# Patient Record
Sex: Female | Born: 1965
Health system: Southern US, Community
[De-identification: ages and names within clinical notes are randomized; demographics above are authoritative.]

## PROBLEM LIST (undated history)

## (undated) DIAGNOSIS — I10 Essential (primary) hypertension: Secondary | ICD-10-CM

## (undated) DIAGNOSIS — B029 Zoster without complications: Secondary | ICD-10-CM

## (undated) DIAGNOSIS — G35 Multiple sclerosis: Secondary | ICD-10-CM

## (undated) DIAGNOSIS — T7840XA Allergy, unspecified, initial encounter: Secondary | ICD-10-CM

## (undated) DIAGNOSIS — R531 Weakness: Secondary | ICD-10-CM

## (undated) HISTORY — DX: Multiple sclerosis: G35

## (undated) HISTORY — DX: Weakness: R53.1

## (undated) HISTORY — DX: Allergy, unspecified, initial encounter: T78.40XA

## (undated) HISTORY — PX: CHOLECYSTECTOMY: SHX55

## (undated) HISTORY — PX: ABDOMINAL HYSTERECTOMY: SHX81

---

## 1997-06-27 ENCOUNTER — Emergency Department (HOSPITAL_COMMUNITY): Admission: EM | Admit: 1997-06-27 | Discharge: 1997-06-28 | Payer: Self-pay

## 1998-08-03 ENCOUNTER — Other Ambulatory Visit: Admission: RE | Admit: 1998-08-03 | Discharge: 1998-08-03 | Payer: Self-pay | Admitting: Gynecology

## 1998-10-07 ENCOUNTER — Ambulatory Visit (HOSPITAL_COMMUNITY): Admission: RE | Admit: 1998-10-07 | Discharge: 1998-10-07 | Payer: Self-pay | Admitting: Gynecology

## 1998-10-07 ENCOUNTER — Encounter: Payer: Self-pay | Admitting: Gynecology

## 1999-03-24 ENCOUNTER — Encounter: Payer: Self-pay | Admitting: Emergency Medicine

## 1999-03-24 ENCOUNTER — Encounter: Payer: Self-pay | Admitting: Neurology

## 1999-03-24 ENCOUNTER — Observation Stay (HOSPITAL_COMMUNITY): Admission: EM | Admit: 1999-03-24 | Discharge: 1999-03-25 | Payer: Self-pay | Admitting: Emergency Medicine

## 1999-12-06 ENCOUNTER — Emergency Department (HOSPITAL_COMMUNITY): Admission: EM | Admit: 1999-12-06 | Discharge: 1999-12-07 | Payer: Self-pay | Admitting: Emergency Medicine

## 1999-12-07 ENCOUNTER — Encounter: Payer: Self-pay | Admitting: Emergency Medicine

## 1999-12-21 ENCOUNTER — Other Ambulatory Visit: Admission: RE | Admit: 1999-12-21 | Discharge: 1999-12-21 | Payer: Self-pay | Admitting: Gynecology

## 2002-08-11 ENCOUNTER — Encounter: Admission: RE | Admit: 2002-08-11 | Discharge: 2002-11-09 | Payer: Self-pay | Admitting: Family Medicine

## 2015-05-04 ENCOUNTER — Emergency Department (HOSPITAL_BASED_OUTPATIENT_CLINIC_OR_DEPARTMENT_OTHER)
Admission: EM | Admit: 2015-05-04 | Discharge: 2015-05-05 | Disposition: A | Discharge status: Home / Self Care | Payer: Self-pay | Attending: Emergency Medicine | Admitting: Emergency Medicine

## 2015-05-04 ENCOUNTER — Encounter (HOSPITAL_BASED_OUTPATIENT_CLINIC_OR_DEPARTMENT_OTHER): Payer: Self-pay | Admitting: *Deleted

## 2015-05-04 DIAGNOSIS — F17210 Nicotine dependence, cigarettes, uncomplicated: Secondary | ICD-10-CM | POA: Insufficient documentation

## 2015-05-04 DIAGNOSIS — Y929 Unspecified place or not applicable: Secondary | ICD-10-CM | POA: Insufficient documentation

## 2015-05-04 DIAGNOSIS — W07XXXA Fall from chair, initial encounter: Secondary | ICD-10-CM | POA: Insufficient documentation

## 2015-05-04 DIAGNOSIS — Y9389 Activity, other specified: Secondary | ICD-10-CM | POA: Insufficient documentation

## 2015-05-04 DIAGNOSIS — Y999 Unspecified external cause status: Secondary | ICD-10-CM | POA: Insufficient documentation

## 2015-05-04 DIAGNOSIS — S39012A Strain of muscle, fascia and tendon of lower back, initial encounter: Secondary | ICD-10-CM | POA: Insufficient documentation

## 2015-05-04 NOTE — ED Notes (Signed)
Pt c/o fall from chair x 1 day ago c/o lower back pain which radiates down right leg

## 2015-05-04 NOTE — ED Provider Notes (Signed)
CSN: [id number]     Arrival date & time 05/04/15  1949 History   First MD Initiated Contact with Patient 05/04/15 2318     Chief Complaint  Patient presents with  . Fall     (Consider location/radiation/quality/duration/timing/severity/associated sxs/prior Treatment) HPI   50 year old female presents with R lower back pain and R leg pain after falling off a chair yesterday afternoon.  Pt report she lost her balance while trying to use the chair to dust.  She fell down, denies head trauma, and no LOC.  Able to ambulate afterward.  Pain is affecting her sleep and she cannot sit well.  Constant, 10/10 sharp and achy sensation down her leg.  Has tried tylenol without relief.  Denies precipitating sxs prior to the fall.  Denies weakness/numbness, or difficulty walking.  NO fever, bowel/bladder incontinence or saddle anesthesia.  Denies IVDU or active cancer.    History reviewed. No pertinent past medical history. Past Surgical History  Procedure Laterality Date  . Abdominal hysterectomy    . Cholecystectomy     History reviewed. No pertinent family history. Social History  Substance Use Topics  . Smoking status: Current Every Day Smoker -- 1.00 packs/day    Types: Cigarettes  . Smokeless tobacco: None  . Alcohol Use: No   OB History    No data available     Review of Systems  All other systems reviewed and are negative.     Allergies  Codeine; Dilaudid; and Penicillins  Home Medications   Prior to Admission medications   Not on File   BP 156/94 mmHg  Pulse 79  Temp(Src) 98.5 F (36.9 C) (Oral)  Resp 16  Ht 5\' 2"  (1.575 m)  Wt 54.432 kg  BMI 21.94 kg/m2  SpO2 97% Physical Exam  Constitutional: She appears well-developed and well-nourished. No distress.  HENT:  Head: Atraumatic.  Eyes: Conjunctivae are normal.  Neck: Neck supple.  Cardiovascular: Intact distal pulses.   Musculoskeletal: She exhibits tenderness (tenderness to lumbar midline radiates to lateral R  thigh towards knee.  short antalgic gait.  -SLR. ). She exhibits no edema.  Neurological: She is alert. She has normal reflexes.  Skin: No rash noted.  Psychiatric: She has a normal mood and affect.  Nursing note and vitals reviewed.   ED Course  Procedures (including critical care time) Labs Review Labs Reviewed - No data to display  Imaging Review Dg Lumbar Spine Complete  05/05/2015  CLINICAL DATA:  Patient fell from a chair 2 days ago. Low back pain radiating to the right knee. EXAM: LUMBAR SPINE - COMPLETE 4+ VIEW COMPARISON:  None. FINDINGS: There is no evidence of lumbar spine fracture. Alignment is normal. Intervertebral disc spaces are maintained. IMPRESSION: Negative. Electronically Signed   By: Burman Nieves M.D.   On: 05/05/2015 01:05   I have personally reviewed and evaluated these images and lab results as part of my medical decision-making.   EKG Interpretation None      MDM   Final diagnoses:  Low back strain, initial encounter    BP 130/94 mmHg  Pulse 78  Temp(Src) 98.5 F (36.9 C) (Oral)  Resp 18  Ht 5\' 2"  (1.575 m)  Wt 54.432 kg  BMI 21.94 kg/m2  SpO2 97%   12:39 AM Patient had a mechanical fall yesterday when she fell standing from a chair trying to dust. She is having low back pain that radiates to her right thigh. She is able to ambulate. X-ray of  low back showing no acute fractures or dislocation. Rice therapy discussed. Orthopedic referral given as needed. Return precaution discussed. Patient otherwise neurovascularly intact.  Fayrene Helper, PA-C 05/05/15 1610  Paula Libra, MD 05/05/15 [phone]

## 2015-05-04 NOTE — ED Notes (Signed)
Fell from standing on chair yesterday  C/o low back pain radiating into rt leg

## 2015-05-05 ENCOUNTER — Emergency Department (HOSPITAL_BASED_OUTPATIENT_CLINIC_OR_DEPARTMENT_OTHER): Payer: Self-pay

## 2015-05-05 IMAGING — DX DG LUMBAR SPINE COMPLETE 4+V
5 series · 5 of 5 positions shown · non-contrast
Comparison: None.

CLINICAL DATA: Patient fell from a chair 2 days ago. Low back pain
radiating to the right knee.

EXAM:
LUMBAR SPINE - COMPLETE 4+ VIEW

[l-spine ap]
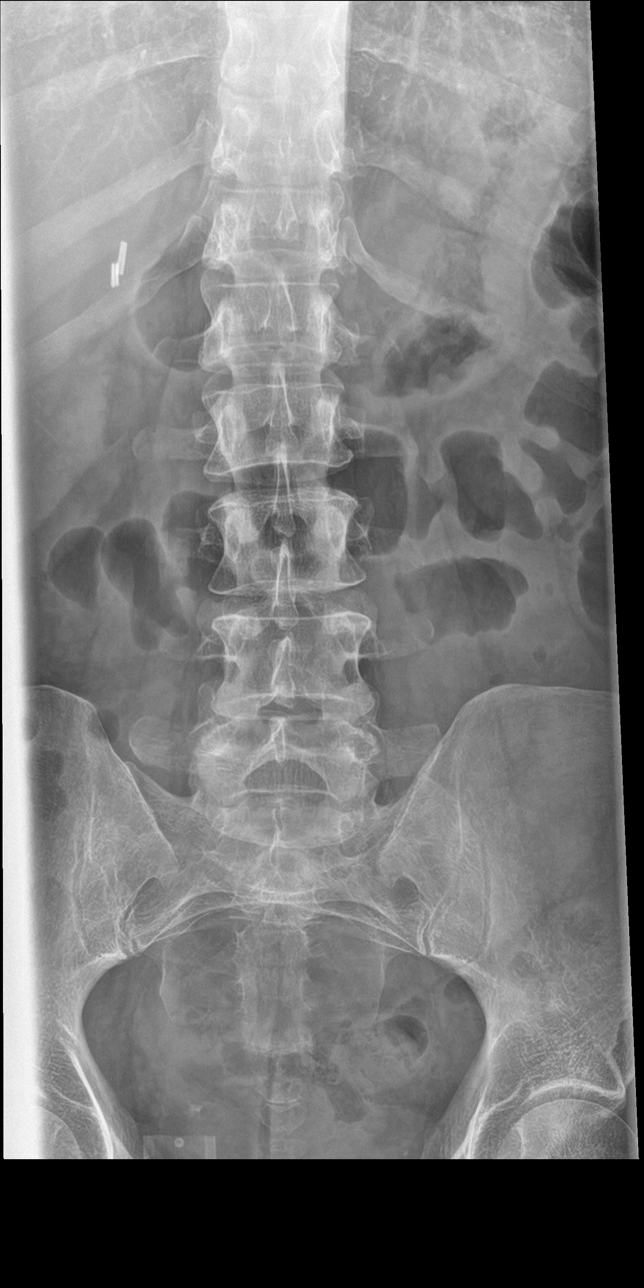

[l-spine obl (1 of 2)]
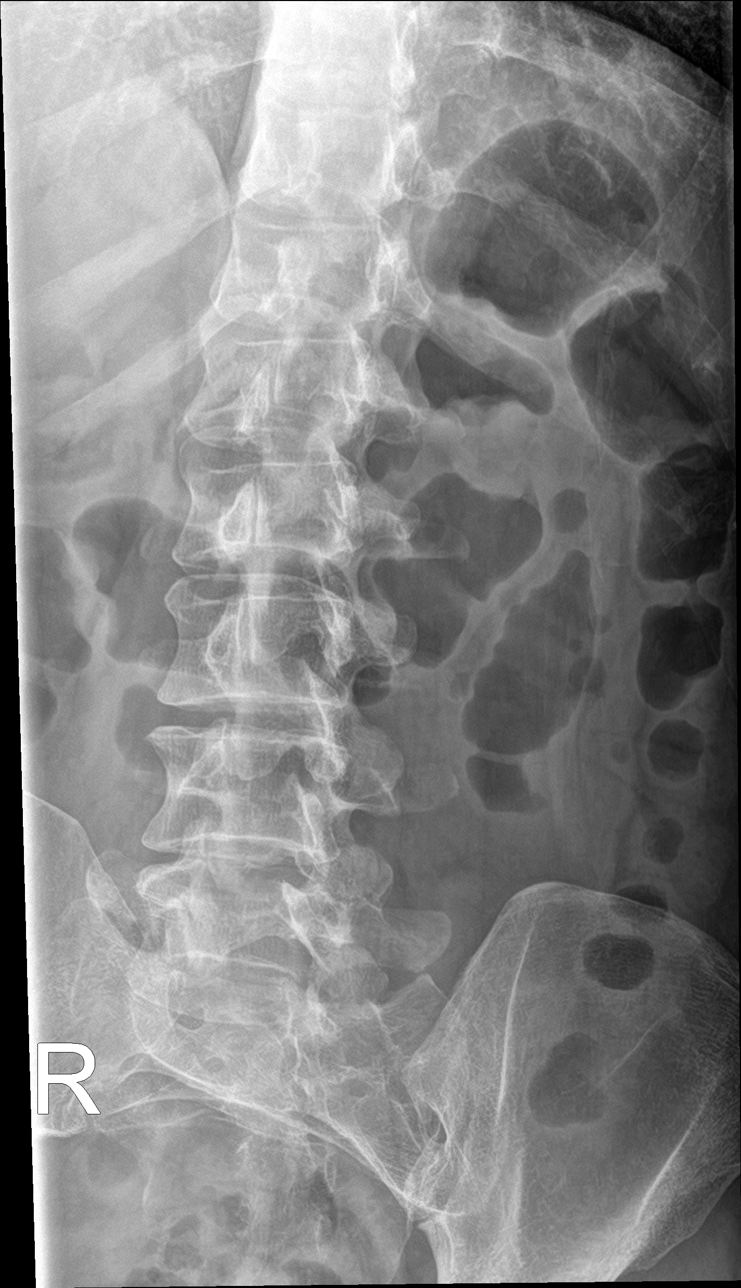

[l-spine obl (2 of 2)]
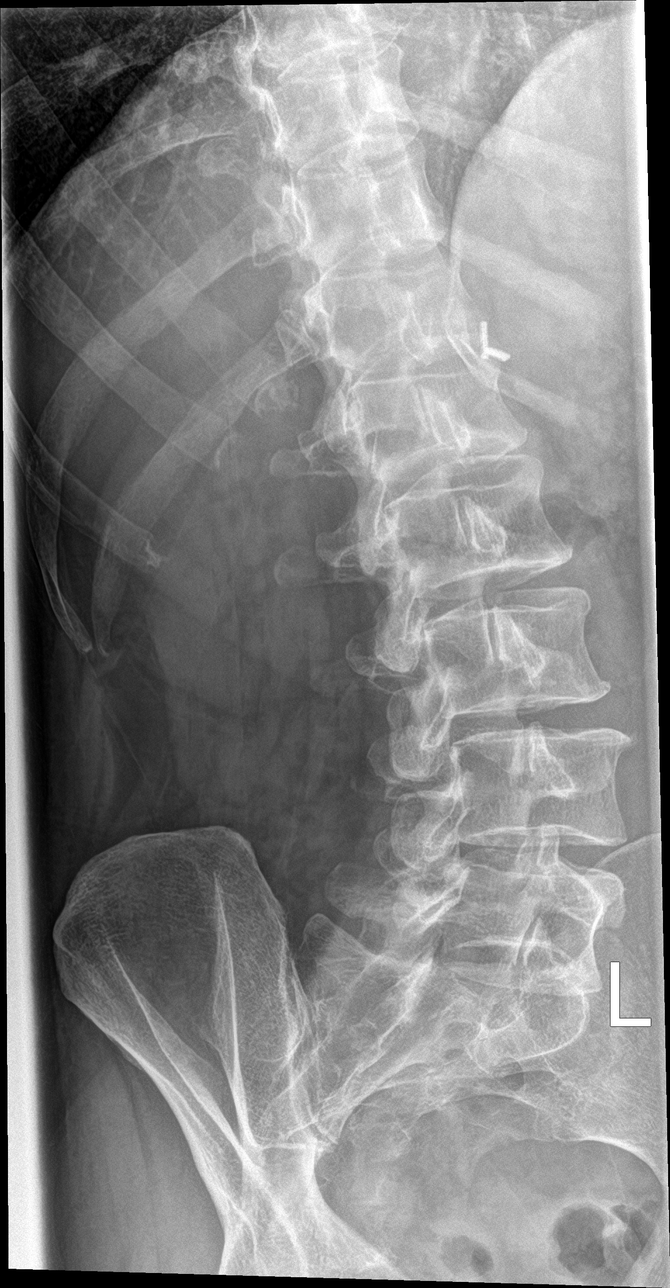

[l-spine lat]
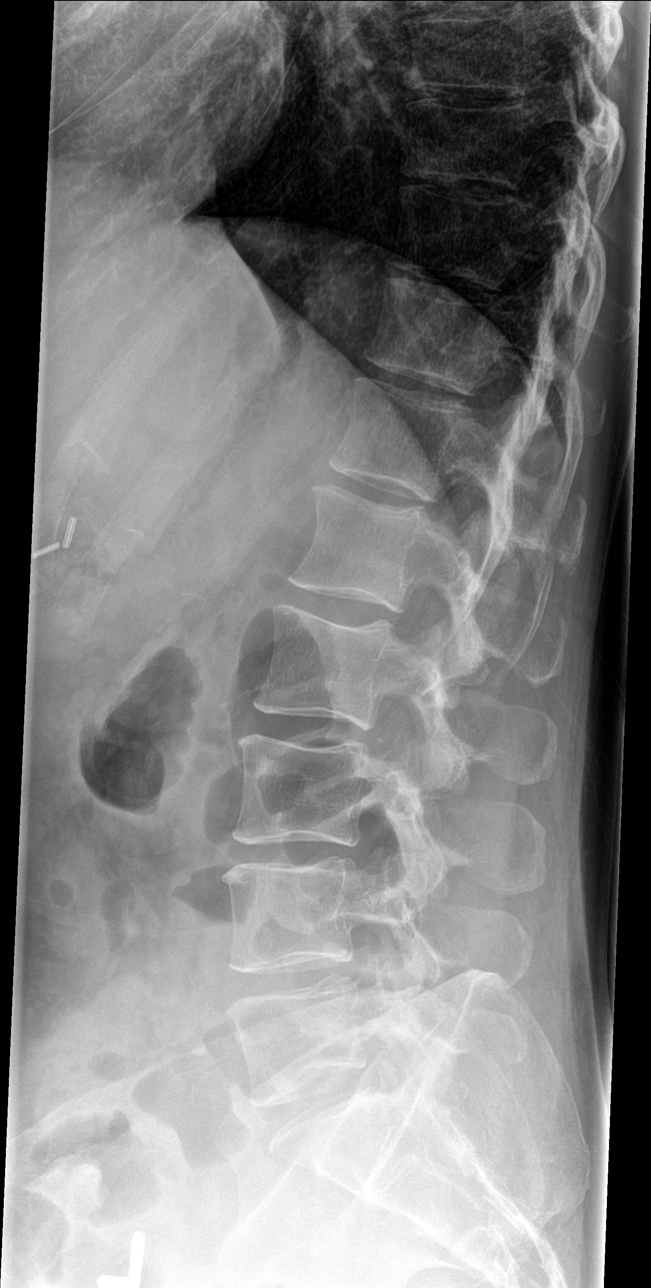

[l-spine spot]
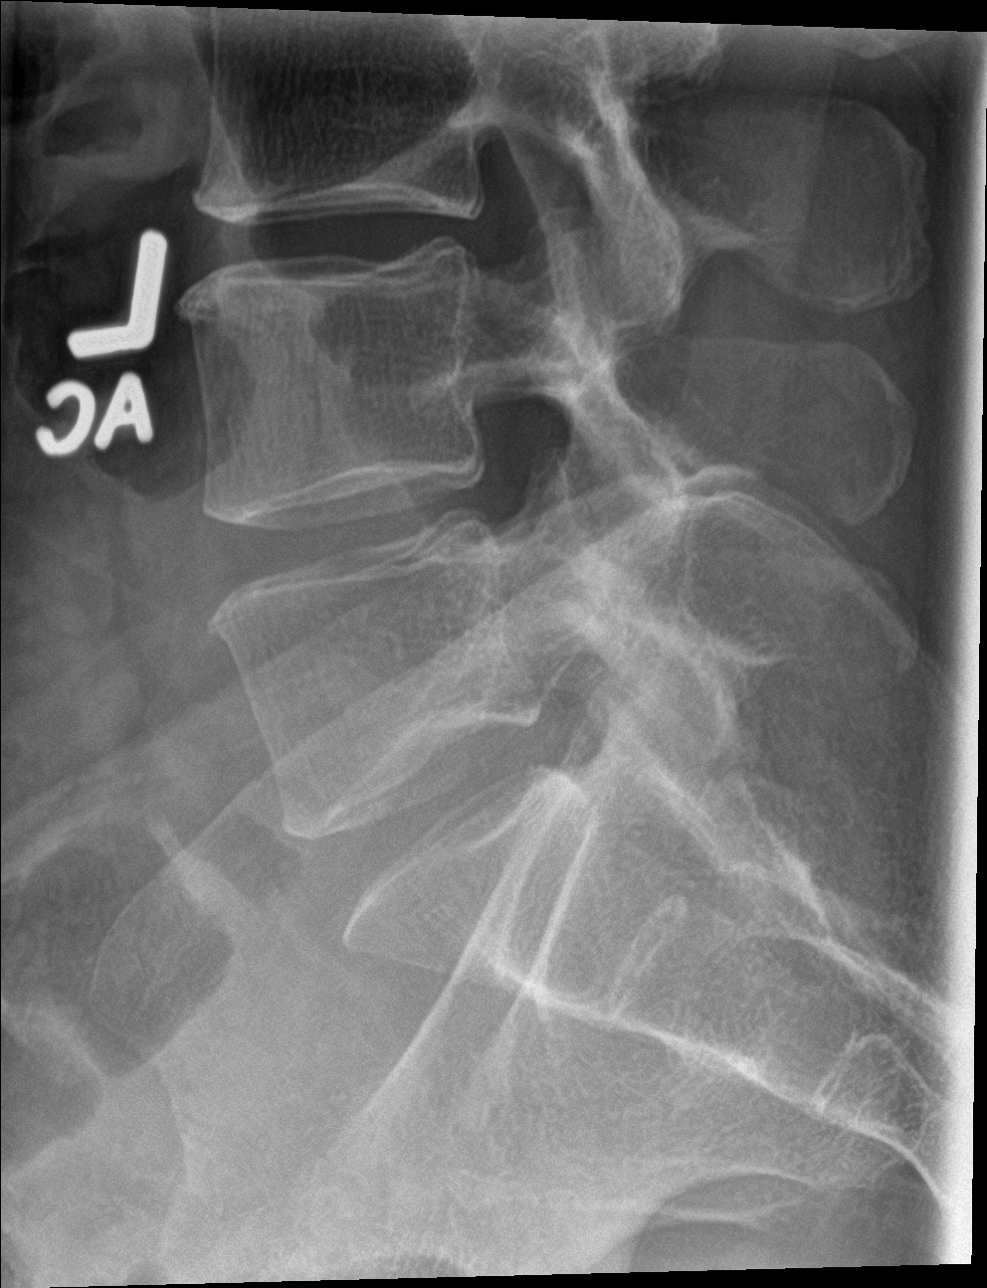

[5 of 5 positions shown; findings below may reference images not displayed]

FINDINGS: There is no evidence of lumbar spine fracture. Alignment is normal.
Intervertebral disc spaces are maintained.
IMPRESSION: Negative.

## 2015-05-05 MED ORDER — METHOCARBAMOL 500 MG PO TABS: 500.0000 mg | ORAL_TABLET | Freq: Two times a day (BID) | ORAL | Status: DC

## 2015-05-05 MED ORDER — OXYCODONE-ACETAMINOPHEN 5-325 MG PO TABS
1.0000 | ORAL_TABLET | Freq: Once | ORAL | Status: AC
  Administered 2015-05-05: 1 via ORAL
  Filled 2015-05-05: qty 1

## 2015-05-05 NOTE — Discharge Instructions (Signed)

## 2015-09-02 ENCOUNTER — Emergency Department (HOSPITAL_BASED_OUTPATIENT_CLINIC_OR_DEPARTMENT_OTHER)
Admission: EM | Admit: 2015-09-02 | Discharge: 2015-09-02 | Disposition: A | Discharge status: Home / Self Care | Payer: Self-pay | Attending: Emergency Medicine | Admitting: Emergency Medicine

## 2015-09-02 ENCOUNTER — Encounter (HOSPITAL_BASED_OUTPATIENT_CLINIC_OR_DEPARTMENT_OTHER): Payer: Self-pay

## 2015-09-02 DIAGNOSIS — E876 Hypokalemia: Secondary | ICD-10-CM | POA: Insufficient documentation

## 2015-09-02 DIAGNOSIS — F17210 Nicotine dependence, cigarettes, uncomplicated: Secondary | ICD-10-CM | POA: Insufficient documentation

## 2015-09-02 DIAGNOSIS — R21 Rash and other nonspecific skin eruption: Secondary | ICD-10-CM | POA: Insufficient documentation

## 2015-09-02 DIAGNOSIS — R200 Anesthesia of skin: Secondary | ICD-10-CM | POA: Insufficient documentation

## 2015-09-02 LAB — CBC WITH DIFFERENTIAL/PLATELET
BASOS ABS: 0 10*3/uL (ref 0.0–0.1)
Basophils Relative: 0 %
EOS ABS: 0.1 10*3/uL (ref 0.0–0.7)
EOS PCT: 0 %
HCT: 45.7 % (ref 36.0–46.0)
Hemoglobin: 16.2 g/dL — ABNORMAL HIGH (ref 12.0–15.0)
Lymphocytes Relative: 24 %
Lymphs Abs: 3.7 10*3/uL (ref 0.7–4.0)
MCH: 31.8 pg (ref 26.0–34.0)
MCHC: 35.4 g/dL (ref 30.0–36.0)
MCV: 89.8 fL (ref 78.0–100.0)
Monocytes Absolute: 0.9 10*3/uL (ref 0.1–1.0)
Monocytes Relative: 6 %
NEUTROS PCT: 70 %
Neutro Abs: 10.7 10*3/uL — ABNORMAL HIGH (ref 1.7–7.7)
PLATELETS: 220 10*3/uL (ref 150–400)
RBC: 5.09 MIL/uL (ref 3.87–5.11)
RDW: 13.2 % (ref 11.5–15.5)
WBC: 15.4 10*3/uL — AB (ref 4.0–10.5)

## 2015-09-02 LAB — COMPREHENSIVE METABOLIC PANEL
ALT: 10 U/L — ABNORMAL LOW (ref 14–54)
AST: 15 U/L (ref 15–41)
Albumin: 4.1 g/dL (ref 3.5–5.0)
Alkaline Phosphatase: 188 U/L — ABNORMAL HIGH (ref 38–126)
Anion gap: 10 (ref 5–15)
BUN: <5 mg/dL — ABNORMAL LOW (ref 6–20)
CO2: 30 mmol/L (ref 22–32)
Calcium: 9.1 mg/dL (ref 8.9–10.3)
Chloride: 100 mmol/L — ABNORMAL LOW (ref 101–111)
Creatinine, Ser: 0.76 mg/dL (ref 0.44–1.00)
GFR calc Af Amer: >60 mL/min (ref >60)
GFR calc non Af Amer: >60 mL/min (ref >60)
Glucose, Bld: 97 mg/dL (ref 65–99)
Potassium: 2.4 mmol/L — CL (ref 3.5–5.1)
Sodium: 140 mmol/L (ref 135–145)
Total Bilirubin: 0.5 mg/dL (ref 0.3–1.2)
Total Protein: 7.5 g/dL (ref 6.5–8.1)

## 2015-09-02 MED ORDER — POTASSIUM CHLORIDE ER 10 MEQ PO TBCR: 20.0000 meq | EXTENDED_RELEASE_TABLET | Freq: Every day | ORAL | 0 refills | Status: DC

## 2015-09-02 MED ORDER — VALACYCLOVIR HCL 1 G PO TABS: 1000.0000 mg | ORAL_TABLET | Freq: Three times a day (TID) | ORAL | 0 refills | Status: AC

## 2015-09-02 MED ORDER — POTASSIUM CHLORIDE CRYS ER 20 MEQ PO TBCR
40.0000 meq | EXTENDED_RELEASE_TABLET | Freq: Once | ORAL | Status: AC
  Administered 2015-09-02: 40 meq via ORAL
  Filled 2015-09-02: qty 2

## 2015-09-02 MED ORDER — VALACYCLOVIR HCL 500 MG PO TABS
1000.0000 mg | ORAL_TABLET | Freq: Once | ORAL | Status: AC
  Administered 2015-09-02: 1000 mg via ORAL
  Filled 2015-09-02: qty 2

## 2015-09-02 MED ORDER — ACETAMINOPHEN 325 MG PO TABS
650.0000 mg | ORAL_TABLET | Freq: Once | ORAL | Status: AC
  Administered 2015-09-02: 650 mg via ORAL
  Filled 2015-09-02: qty 2

## 2015-09-02 NOTE — ED Notes (Signed)
PA at bedside.

## 2015-09-02 NOTE — ED Notes (Signed)
Patient c/o rash to left side of face and head. At this time the rash is dry and scabbed over. Patient states it is painful and itchy, onset Monday. Patient denies any new bath products, laundry products, or medications. A&Ox4, NAD noted.

## 2015-09-02 NOTE — ED Provider Notes (Signed)
[facility]   CSN: [id number] Arrival date & time: 09/02/15  1545     History   Chief Complaint Chief Complaint  Patient presents with  . Rash    HPI Gabriela Campbell is a 50 y.o. female.  Gabriela Campbell is a 50 y.o. female presents to ED with complaint of rash to left side of face. Patient states she first noticed rash on Monday and has progressively worsened. She describes the rash as a painful, burning, itching sensation. She notes red papules to the left side of her face. Sxs are worse when anything touches the side of her face. She has associated decreased appetite, generalized fatigue, ear itching, a "fuzzy" sensation in mouth, and left sided facial numbness. She denies fever, trouble swallowing, oral lesions, ear pain/discharge, changes in vision, dry eyes, neck pain, shortness of breath, chest pain, facial droop, slurred speech, other numbness or weakness, headache, dizziness, lightheadedness, loss of consciousness. She is able to close her eye and keep it closed. Denies rashes anywhere else. She has tried topical hydrocortisone cream with minimal relief. She denies any recent changes to soap, lotion, or make up. No recent changes to medications or recent ABX use. No exposure to poison ivy or poison oak. No one with similar sxs.       History reviewed. No pertinent past medical history.  There are no active problems to display for this patient.   Past Surgical History:  Procedure Laterality Date  . ABDOMINAL HYSTERECTOMY    . CHOLECYSTECTOMY      OB History    No data available       Home Medications    Prior to Admission medications   Medication Sig Start Date End Date Taking? Authorizing Provider  potassium chloride (K-DUR) 10 MEQ tablet Take 2 tablets (20 mEq total) by mouth daily. 09/02/15   Lona Kettle, PA-C  valACYclovir (VALTREX) 1000 MG tablet Take 1 tablet (1,000 mg total) by mouth 3 (three) times daily. 09/02/15 09/09/15   Lona Kettle, PA-C    Family History No family history on file.  Social History Social History  Substance Use Topics  . Smoking status: Current Every Day Smoker    Packs/day: 1.00    Types: Cigarettes  . Smokeless tobacco: Never Used  . Alcohol use No     Allergies   Codeine; Dilaudid [hydromorphone hcl]; and Penicillins   Review of Systems Review of Systems  Constitutional: Positive for chills. Negative for diaphoresis and fever.  HENT: Negative for ear discharge, ear pain, hearing loss, mouth sores and trouble swallowing.   Eyes: Negative for visual disturbance.  Respiratory: Negative for shortness of breath.   Cardiovascular: Negative for chest pain.  Gastrointestinal: Negative for abdominal pain, blood in stool, constipation, diarrhea, nausea and vomiting.  Genitourinary: Negative for dysuria and hematuria.  Musculoskeletal: Negative for neck pain.  Skin: Positive for rash.  Neurological: Positive for numbness. Negative for dizziness, syncope, facial asymmetry, speech difficulty, weakness, light-headedness and headaches.     Physical Exam Updated Vital Signs BP 124/88 (BP Location: Left Arm)   Pulse 80   Temp 98.5 F (36.9 C) (Oral)   Resp 18   Ht 5\' 2"  (1.575 m)   Wt 51.7 kg   SpO2 98%   BMI 20.85 kg/m   Physical Exam  Constitutional: She appears well-developed and well-nourished. No distress.  HENT:  Head: Normocephalic and atraumatic.  Right Ear: Tympanic membrane, external ear and ear canal normal.  Left Ear: Tympanic membrane, external ear and ear canal normal.  Mouth/Throat: Uvula is midline, oropharynx is clear and moist and mucous membranes are normal. No oral lesions. No trismus in the jaw. No uvula swelling. No oropharyngeal exudate.  No swelling of sublingual area. No rash or lesions noted on ear or in ear canal.   Eyes: Conjunctivae and EOM are normal. Pupils are equal, round, and reactive to light. Right eye exhibits no discharge. Left  eye exhibits no discharge. No scleral icterus.  No rashes noted on lids.   Neck: Normal range of motion and phonation normal. Neck supple. No neck rigidity. Normal range of motion present.  Cardiovascular: Normal rate, regular rhythm, normal heart sounds and intact distal pulses.   No murmur heard. Pulmonary/Chest: Effort normal and breath sounds normal. No stridor. No respiratory distress.  Abdominal: Soft. Bowel sounds are normal. There is no tenderness. There is no rigidity, no rebound and no guarding.  Musculoskeletal: Normal range of motion.  Lymphadenopathy:    She has no cervical adenopathy.  Neurological: She is alert. She is not disoriented. Coordination normal. GCS eye subscore is 4. GCS verbal subscore is 5. GCS motor subscore is 6.  Mental Status:  Alert, thought content appropriate, able to give a coherent history. Speech fluent without evidence of aphasia. Able to follow 2 step commands without difficulty.  Cranial Nerves:  II:  Peripheral visual fields grossly normal, pupils equal, round, reactive to light III,IV, VI: ptosis not present, extra-ocular motions intact bilaterally  V,VII: smile symmetric, patient reports decrease light touch sensation to left side of face VIII: hearing grossly normal to voice  X: uvula elevates symmetrically  XI: bilateral shoulder shrug symmetric and strong XII: midline tongue extension without fassiculations Motor:  Normal tone. 5/5 in upper and lower extremities bilaterally including strong and equal grip strength and dorsiflexion/plantar flexion Sensory: light touch normal in all extremities. Cerebellar: normal finger-to-nose with bilateral upper extremities Gait: normal gait and balance CV: distal pulses palpable throughout   Skin: Skin is warm and dry. She is not diaphoretic.     Psychiatric: She has a normal mood and affect. Her behavior is normal.     ED Treatments / Results  Labs (all labs ordered are listed, but only abnormal  results are displayed) Labs Reviewed  COMPREHENSIVE METABOLIC PANEL - Abnormal; Notable for the following:       Result Value   Potassium 2.4 (*)    Chloride 100 (*)    BUN <5 (*)    ALT 10 (*)    Alkaline Phosphatase 188 (*)    All other components within normal limits  CBC WITH DIFFERENTIAL/PLATELET - Abnormal; Notable for the following:    WBC 15.4 (*)    Hemoglobin 16.2 (*)    Neutro Abs 10.7 (*)    All other components within normal limits    EKG  EKG Interpretation  Date/Time:  Friday September 02 2015 18:31:02 EDT Ventricular Rate:  71 PR Interval:    QRS Duration: 109 QT Interval:  398 QTC Calculation: 433 R Axis:   80 Text Interpretation:  Sinus rhythm Borderline repolarization abnormality No STEMI. No old tracing for comparison.  Confirmed by LONG MD, JOSHUA [phone]) on 09/02/2015 6:39:54 PM       Radiology No results found.  Procedures Procedures (including critical care time)  Medications Ordered in ED Medications  acetaminophen (TYLENOL) tablet 650 mg (650 mg Oral Given 09/02/15 1738)  valACYclovir (VALTREX) tablet 1,000 mg (1,000 mg Oral Given  09/02/15 1738)  potassium chloride SA (K-DUR,KLOR-CON) CR tablet 40 mEq (40 mEq Oral Given 09/02/15 1838)     Initial Impression / Assessment and Plan / ED Course  I have reviewed the triage vital signs and the nursing notes.  Pertinent labs & imaging results that were available during my care of the patient were reviewed by me and considered in my medical decision making (see chart for details).  Clinical Course    Patient presents to ED complaint of rash. Patient is afebrile and non-toxic appearing in NAD. Vital signs remarkable for elevated BP, otherwise stable. Physical Exam remarkable for fine erythematous papules on left temporal with decreased light touch sensation. No other focal neurologic deficits noted. Patient denies changes in vision. No lesions noted around eyelids. No lesions. no lesions noted on or in  ear. No trismus. No oral lesions. Patient is managing oral secretions. No nuchal rigidity. Suspect ?herpes zoster vs ?dermatitis. Low suspicion for stroke given no other focal neurological neurologic deficits; however, given facial numbness will consult neurology. Will check CBC and CMP tried abnormalities that may contribute to numbness.  5:32 PM: Spoke with Dr. Roxy Mannsster of Neurology, greatly appreciated his time and input. Recommend outpatient follow-up regarding facial numbness. Suspect numbness may be related to zoster. Do not need emergent MRI at this time.   Patient given Tylenol and dose of valacyclovir. CBC remarkable for elevated WBC - suspect may be secondary to rash and possible zoster. CMP remarkable for hypokalemia. Unclear etiology, no vomiting or diarrhea. No chronic medications. EKG shows sinus rhythm, no STEMI. PO potassium given. Discussed results with patient. Rx valacyclovir and potassium. Symptomatic management with Tylenol. Referral to neurology. Discussed follow up next week in ED or with PCP for recheck of potassium. Patient does not have PCP, resources provided, strongly encouraged patient to establish a PCP. Contact precautions given. Return precautions discussed. Patient voiced understanding and is agreeable.   Final Clinical Impressions(s) / ED Diagnoses   Final diagnoses:  Rash  Hypokalemia    New Prescriptions New Prescriptions   POTASSIUM CHLORIDE (K-DUR) 10 MEQ TABLET    Take 2 tablets (20 mEq total) by mouth daily.   VALACYCLOVIR (VALTREX) 1000 MG TABLET    Take 1 tablet (1,000 mg total) by mouth 3 (three) times daily.     Lona KettleAshley Laurel Meyer, PA-C 09/02/15 1856    Maia PlanJoshua G Long, MD 09/02/15 [phone]

## 2015-09-02 NOTE — ED Triage Notes (Addendum)
C/o spotty rash to left temporal area, inside of mouth "feels fuzzy" x 5 days-states she thinks it may be shingles-denies hx of shingles-NAD-steady gait

## 2015-09-02 NOTE — ED Notes (Signed)
PA notified of critical potassium 

## 2015-09-02 NOTE — Discharge Instructions (Signed)
Read the information below.   Your potassium was low in the ED. You were given potassium here and you are being prescribed potassium. Take as directed. Return to ED or primary care provider next week (Tuesday/Wednesday) for recheck of your potassium.  You are being treated for possible herpes zoster infection. Take medicine as prescribed. Discontinue and return to ED if you develop rash of trouble breathing.  You can take tylenol for symptomatic relief.  Use the prescribed medication as directed.  Please discuss all new medications with your pharmacist.   Be sure to follow up with neurology regarding the numbness. I have provided the contact information above. Please call to schedule an appointment.  You may return to the Emergency Department at any time for worsening condition or any new symptoms that concern you. Return to ED if you develop fever, trouble swallowing, trouble breathing, changes in vision, eye pain, numbness/weakness in extremities, slurred speech, or facial droop.

## 2016-04-17 ENCOUNTER — Emergency Department (HOSPITAL_COMMUNITY)
Admission: EM | Admit: 2016-04-17 | Discharge: 2016-04-17 | Disposition: A | Discharge status: Home / Self Care | Payer: Self-pay | Attending: Emergency Medicine | Admitting: Emergency Medicine

## 2016-04-17 ENCOUNTER — Encounter (HOSPITAL_COMMUNITY): Payer: Self-pay | Admitting: Emergency Medicine

## 2016-04-17 DIAGNOSIS — X58XXXA Exposure to other specified factors, initial encounter: Secondary | ICD-10-CM | POA: Insufficient documentation

## 2016-04-17 DIAGNOSIS — M25551 Pain in right hip: Secondary | ICD-10-CM | POA: Insufficient documentation

## 2016-04-17 DIAGNOSIS — Y929 Unspecified place or not applicable: Secondary | ICD-10-CM | POA: Insufficient documentation

## 2016-04-17 DIAGNOSIS — Y999 Unspecified external cause status: Secondary | ICD-10-CM | POA: Insufficient documentation

## 2016-04-17 DIAGNOSIS — S39012A Strain of muscle, fascia and tendon of lower back, initial encounter: Secondary | ICD-10-CM | POA: Insufficient documentation

## 2016-04-17 DIAGNOSIS — F17210 Nicotine dependence, cigarettes, uncomplicated: Secondary | ICD-10-CM | POA: Insufficient documentation

## 2016-04-17 DIAGNOSIS — Y939 Activity, unspecified: Secondary | ICD-10-CM | POA: Insufficient documentation

## 2016-04-17 DIAGNOSIS — T148XXA Other injury of unspecified body region, initial encounter: Secondary | ICD-10-CM

## 2016-04-17 MED ORDER — CYCLOBENZAPRINE HCL 10 MG PO TABS: 10.0000 mg | ORAL_TABLET | Freq: Two times a day (BID) | ORAL | 0 refills | Status: DC | PRN

## 2016-04-17 NOTE — ED Provider Notes (Signed)
[facility]-EMERGENCY DEPT Provider Note   CSN: [id number] Arrival date & time: 04/17/16  1448  By signing my name below, I, Gabriela Campbell, attest that this documentation has been prepared under the direction and in the presence of [name], PA-C. Electronically Signed: Marnette Burgess Campbell, Scribe. 04/17/2016. 6:59 PM.  History   Chief Complaint Chief Complaint  Patient presents with  . Back Pain   The history is provided by the patient and medical records. No language interpreter was used.    HPI Comments:  Gabriela Campbell is a 51 y.o. female with no pertinent PMHx, who presents to the Emergency Department complaining of gradually worsening, radiating, aching, 9/10 mid/low back pain onset four weeks ago. Pt reports her lower back pain spontaneously arose four weeks ago and has been persistent and gradually worsening since that time. No recent injuries or falls stated. She states it feels like "my legs feel like they are going to give out on me" noting a coworker had to help her to the car today for fear of falling. The pain is centralized on the right lower side and radiates up into her middle back and down her right leg to her mid-thigh. Pt has associated symptoms of leg weakness and reduced sleep d/t discomfort. She notes exertion, weight bearing, ambulation, and direct palpation exacerbates her pain. 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 Per chart review, pt was seen in the ED on 05/04/15 for a injury to her lower back s/p a mechanical fall. She has a stated prior h/o Renal Calculi. Pt has tried tylenol, heat, and ice packs with no relief of her symptoms. Pt denies incontinence of bladder/bowel, saddle anesthesia, BLE numbness, abdominal pain, diarrhea, constipation, rash, foot pain, dizziness, light-headedness, and any other complaints at this time. No h/o  of back or hip surgery. Pt is a current every day smoker. Pt does not currently have a PCP.    History reviewed. No pertinent past medical history.  There are no active problems to display for this patient.  Past Surgical History:  Procedure Laterality Date  . ABDOMINAL HYSTERECTOMY    . CHOLECYSTECTOMY     OB History    No data available     Home Medications    Prior to Admission medications   Medication Sig Start Date End Date Taking? Authorizing Provider  cyclobenzaprine (FLEXERIL) 10 MG tablet Take 1 tablet (10 mg total) by mouth 2 (two) times daily as needed for muscle spasms. 04/17/16   Ikeisha Blumberg A Deonne Rooks, PA-C  potassium chloride (K-DUR) 10 MEQ tablet Take 2 tablets (20 mEq total) by mouth daily. 09/02/15   Deborha Payment, PA-C    Family History History reviewed. No pertinent family history.  Social History Social History  Substance Use Topics  . Smoking status: Current Every Day Smoker    Packs/day: 1.00    Types: Cigarettes  . Smokeless tobacco: Never Used  . Alcohol use No     Allergies   Codeine; Dilaudid [hydromorphone hcl]; and Penicillins   Review of Systems Review of Systems  Constitutional: Negative for activity change.  Respiratory: Negative for shortness of breath.   Cardiovascular: Negative for chest pain.  Gastrointestinal: Negative for abdominal pain, constipation and diarrhea.  Genitourinary:       Negative incontinence of bladder/bowel  Musculoskeletal: Positive for  back pain and myalgias.  Skin: Negative for rash.  Neurological: Positive for weakness. Negative for dizziness, light-headedness and numbness.  Psychiatric/Behavioral: Positive for sleep disturbance (d/t discomfort).   Physical Exam Updated Vital Signs BP 132/82   Pulse 89   Temp 98.2 F (36.8 C) (Oral)   Resp 14   SpO2 98%   Physical Exam  Constitutional: She appears well-developed and well-nourished. No distress.  HENT:  Head: Normocephalic and atraumatic.  Eyes:  Conjunctivae are normal.  Neck: Neck supple.  Cardiovascular: Normal rate, regular rhythm and normal heart sounds.  Exam reveals no gallop and no friction rub.   No murmur heard. Pulmonary/Chest: Effort normal and breath sounds normal. No respiratory distress. She has no wheezes. She has no rales.  Abdominal: Soft. She exhibits no distension. There is no tenderness. There is no guarding.  Musculoskeletal: She exhibits tenderness. She exhibits no edema.  Diffuse TTP over the paraspinal muscles of the lumbar and thoracic spine. No midline tenderness. 5/5 strength of the BLE. Sensation intact. Diffuse TTP over the lateral aspect of the right upper leg, including the trochanteric bursa. No cervical spine, right knee, or right ankle tenderness. No tenderness along the left side.   Neurological: She is alert. She displays normal reflexes.  Skin: Skin is warm and dry. No rash noted. She is not diaphoretic.  Psychiatric: Her behavior is normal.  Nursing note and vitals reviewed.   ED Treatments / Results  DIAGNOSTIC STUDIES:  Oxygen Saturation is 100% on RA, normal by my interpretation.    COORDINATION OF CARE:  6:59 PM Discussed treatment plan with pt at bedside including Flexeril, Tylenol and pt agreed to plan.   Labs (all labs ordered are listed, but only abnormal results are displayed) Labs Reviewed - No data to display  EKG  EKG Interpretation None       Radiology No results found.  Procedures Procedures (including critical care time)  Medications Ordered in ED Medications - No data to display   Initial Impression / Assessment and Plan / ED Course  I have reviewed the triage vital signs and the nursing notes.  Pertinent labs & imaging results that were available during my care of the patient were reviewed by me and considered in my medical decision making (see chart for details).     Patient with right-sided back and hip pain.  No neurological deficits and normal neuro  exam.  Patient is ambulatory.  No loss of bowel or bladder control.  No concern for cauda equina.  No fever, night sweats, weight loss, h/o cancer, IVDA, no recent procedure to back. No urinary symptoms suggestive of UTI.  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 Supportive care and return precaution discussed. Appears safe for discharge at this time. Follow up as indicated in discharge paperwork.    Final Clinical Impressions(s) / ED Diagnoses   Final diagnoses:  Right hip pain  Muscle strain    New Prescriptions Discharge Medication List as of 04/17/2016  7:27 PM    START taking these medications   Details  cyclobenzaprine (FLEXERIL) 10 MG tablet Take 1 tablet (10 mg total) by mouth 2 (two) times daily as needed for muscle spasms., Starting Tue 04/17/2016, Print        I personally performed the services described in this documentation, which was scribed in my presence. The  recorded information has been reviewed and is accurate.     Barkley Boards, PA-C 04/17/16 1942    Jacalyn Lefevre, MD 04/17/16 2242

## 2016-04-17 NOTE — ED Notes (Signed)
Pt denies difficulty urinating at this time. Pt states the pain is in her right side of her back.   Pt has tried tylenol, heat, and ice packs with no relief.

## 2016-04-17 NOTE — Discharge Instructions (Signed)
Please take 1000 mg of Tylenol 2 times per day. Do not take the Flexeril if you have to drive or go to work because it can make you sleepy. Please rest your back and hip and apply ice. If your symptoms do not improve over the next week, please call the [facility] and [facility] or Orthopaedics for an appointment. Return to the Emergency Department if you symptoms worsen or if you develop new symptoms.

## 2016-04-17 NOTE — ED Triage Notes (Signed)
Pt c/o lower and mid back pain x 4 weeks. Pt states she is taking tylenol with minimal relief. Pt does not recall any injury or event that caused back pain. Pt states pain radiates to lateral R lower extremity and weakness in legs bilaterally.

## 2016-05-03 ENCOUNTER — Emergency Department (HOSPITAL_COMMUNITY)
Admission: EM | Admit: 2016-05-03 | Discharge: 2016-05-03 | Disposition: A | Discharge status: Home / Self Care | Payer: Self-pay | Attending: Emergency Medicine | Admitting: Emergency Medicine

## 2016-05-03 ENCOUNTER — Emergency Department (HOSPITAL_COMMUNITY): Payer: Self-pay

## 2016-05-03 ENCOUNTER — Encounter (HOSPITAL_COMMUNITY): Payer: Self-pay | Admitting: Emergency Medicine

## 2016-05-03 DIAGNOSIS — R21 Rash and other nonspecific skin eruption: Secondary | ICD-10-CM | POA: Insufficient documentation

## 2016-05-03 DIAGNOSIS — Z79899 Other long term (current) drug therapy: Secondary | ICD-10-CM | POA: Insufficient documentation

## 2016-05-03 DIAGNOSIS — F17210 Nicotine dependence, cigarettes, uncomplicated: Secondary | ICD-10-CM | POA: Insufficient documentation

## 2016-05-03 HISTORY — DX: Zoster without complications: B02.9

## 2016-05-03 LAB — CBC WITH DIFFERENTIAL/PLATELET
Basophils Absolute: 0 10*3/uL (ref 0.0–0.1)
Basophils Relative: 0 %
Eosinophils Absolute: 0.1 10*3/uL (ref 0.0–0.7)
Eosinophils Relative: 1 %
HCT: 44.4 % (ref 36.0–46.0)
HEMOGLOBIN: 15 g/dL (ref 12.0–15.0)
LYMPHS ABS: 2.6 10*3/uL (ref 0.7–4.0)
Lymphocytes Relative: 21 %
MCH: 32.1 pg (ref 26.0–34.0)
MCHC: 33.8 g/dL (ref 30.0–36.0)
MCV: 95.1 fL (ref 78.0–100.0)
Monocytes Absolute: 0.7 10*3/uL (ref 0.1–1.0)
Monocytes Relative: 6 %
NEUTROS PCT: 72 %
Neutro Abs: 9 10*3/uL — ABNORMAL HIGH (ref 1.7–7.7)
PLATELETS: 206 10*3/uL (ref 150–400)
RBC: 4.67 MIL/uL (ref 3.87–5.11)
RDW: 13 % (ref 11.5–15.5)
WBC: 12.4 10*3/uL — AB (ref 4.0–10.5)

## 2016-05-03 LAB — COMPREHENSIVE METABOLIC PANEL
ALT: 7 U/L — ABNORMAL LOW (ref 14–54)
AST: 14 U/L — AB (ref 15–41)
Albumin: 3.9 g/dL (ref 3.5–5.0)
Alkaline Phosphatase: 190 U/L — ABNORMAL HIGH (ref 38–126)
Anion gap: 6 (ref 5–15)
BUN: 8 mg/dL (ref 6–20)
CHLORIDE: 103 mmol/L (ref 101–111)
CO2: 34 mmol/L — ABNORMAL HIGH (ref 22–32)
Calcium: 8.8 mg/dL — ABNORMAL LOW (ref 8.9–10.3)
Creatinine, Ser: 0.65 mg/dL (ref 0.44–1.00)
Glucose, Bld: 100 mg/dL — ABNORMAL HIGH (ref 65–99)
POTASSIUM: 3.5 mmol/L (ref 3.5–5.1)
Sodium: 143 mmol/L (ref 135–145)
Total Bilirubin: 0.4 mg/dL (ref 0.3–1.2)
Total Protein: 7.2 g/dL (ref 6.5–8.1)

## 2016-05-03 IMAGING — CR DG CHEST 2V
2 series · 2 of 2 positions shown · non-contrast
Comparison: None.

CLINICAL DATA: Patient reports rash under left arm 10 to the back
of head x2 weeks. History of shingles. Tobacco user.

EXAM:
CHEST  2 VIEW

[w chest pa]
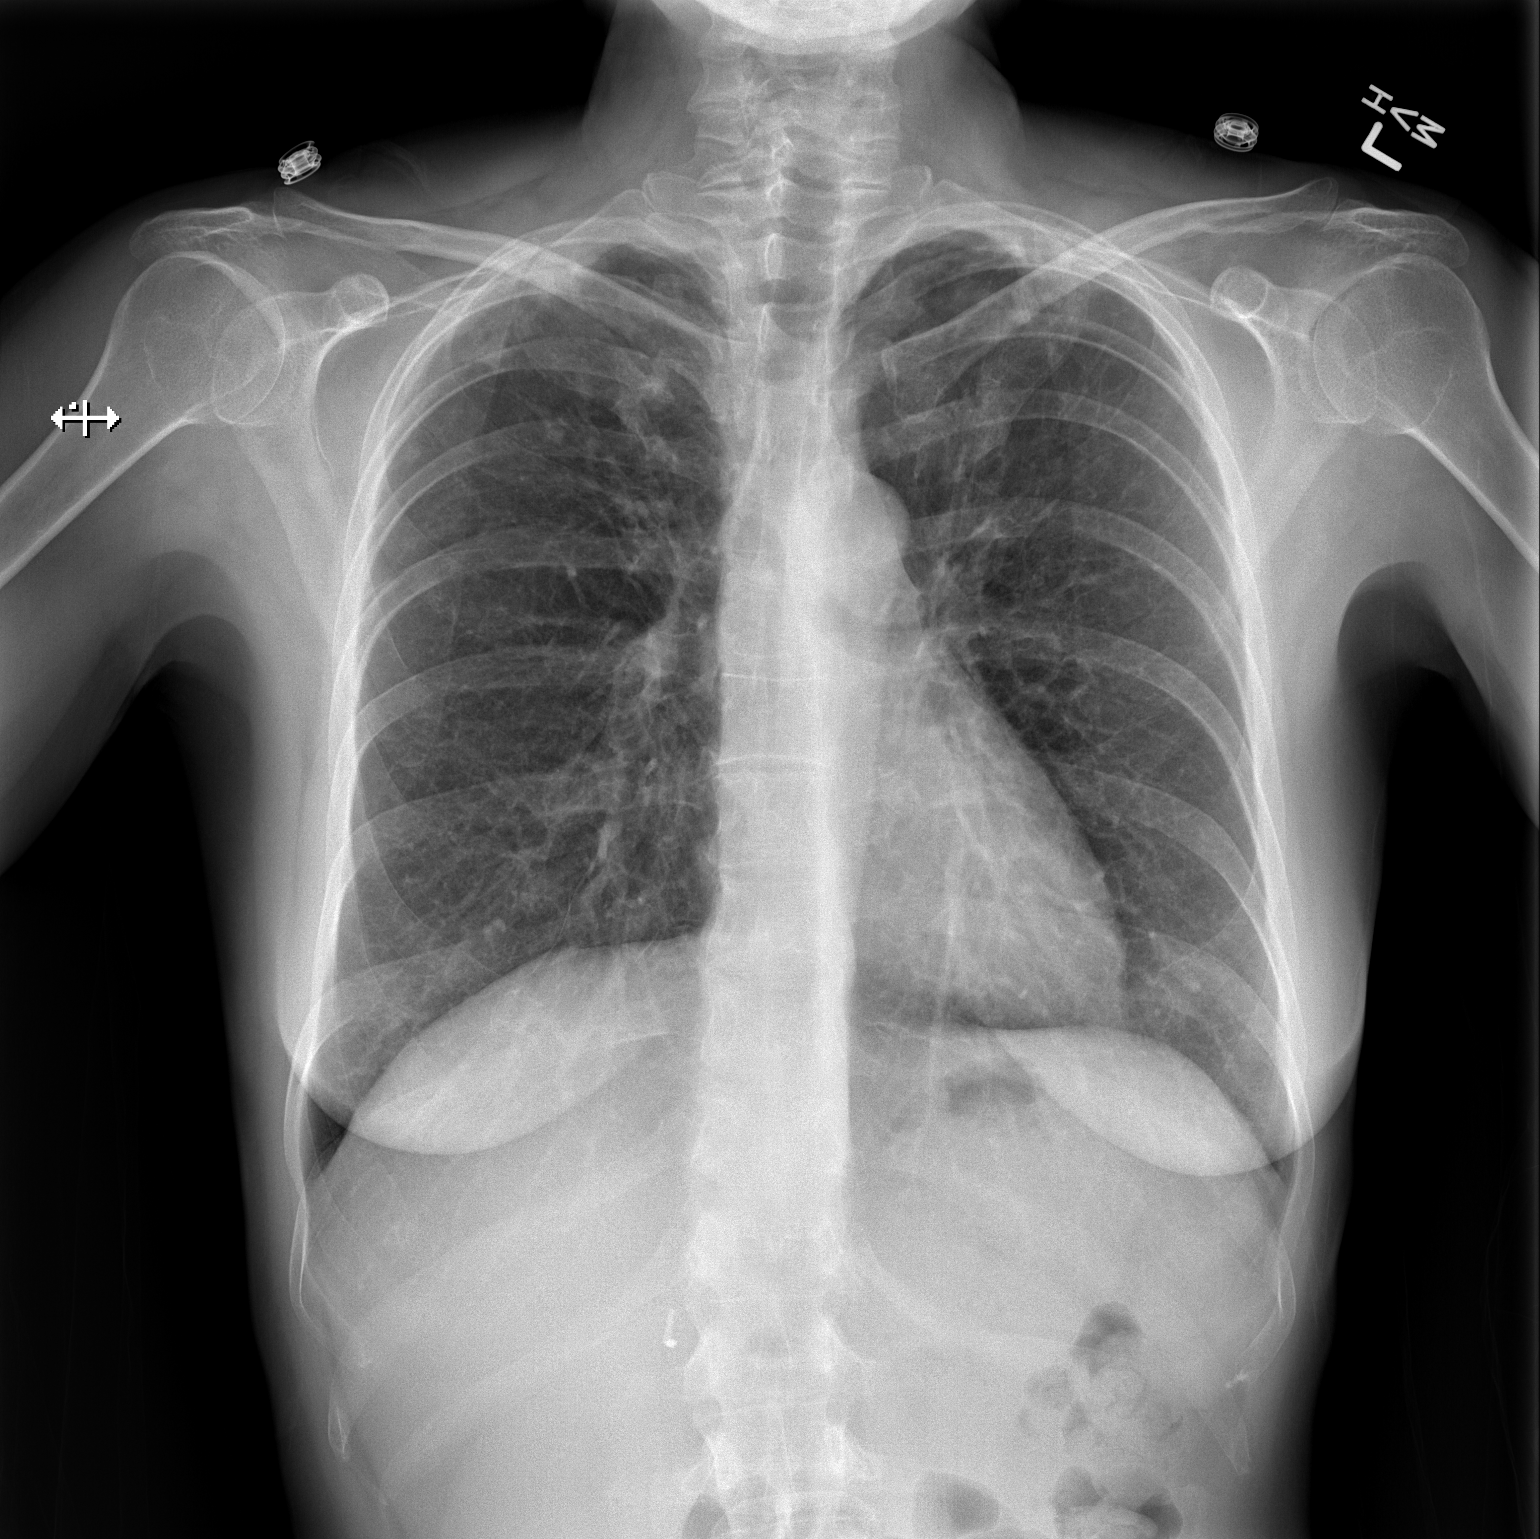

[w chest lat]
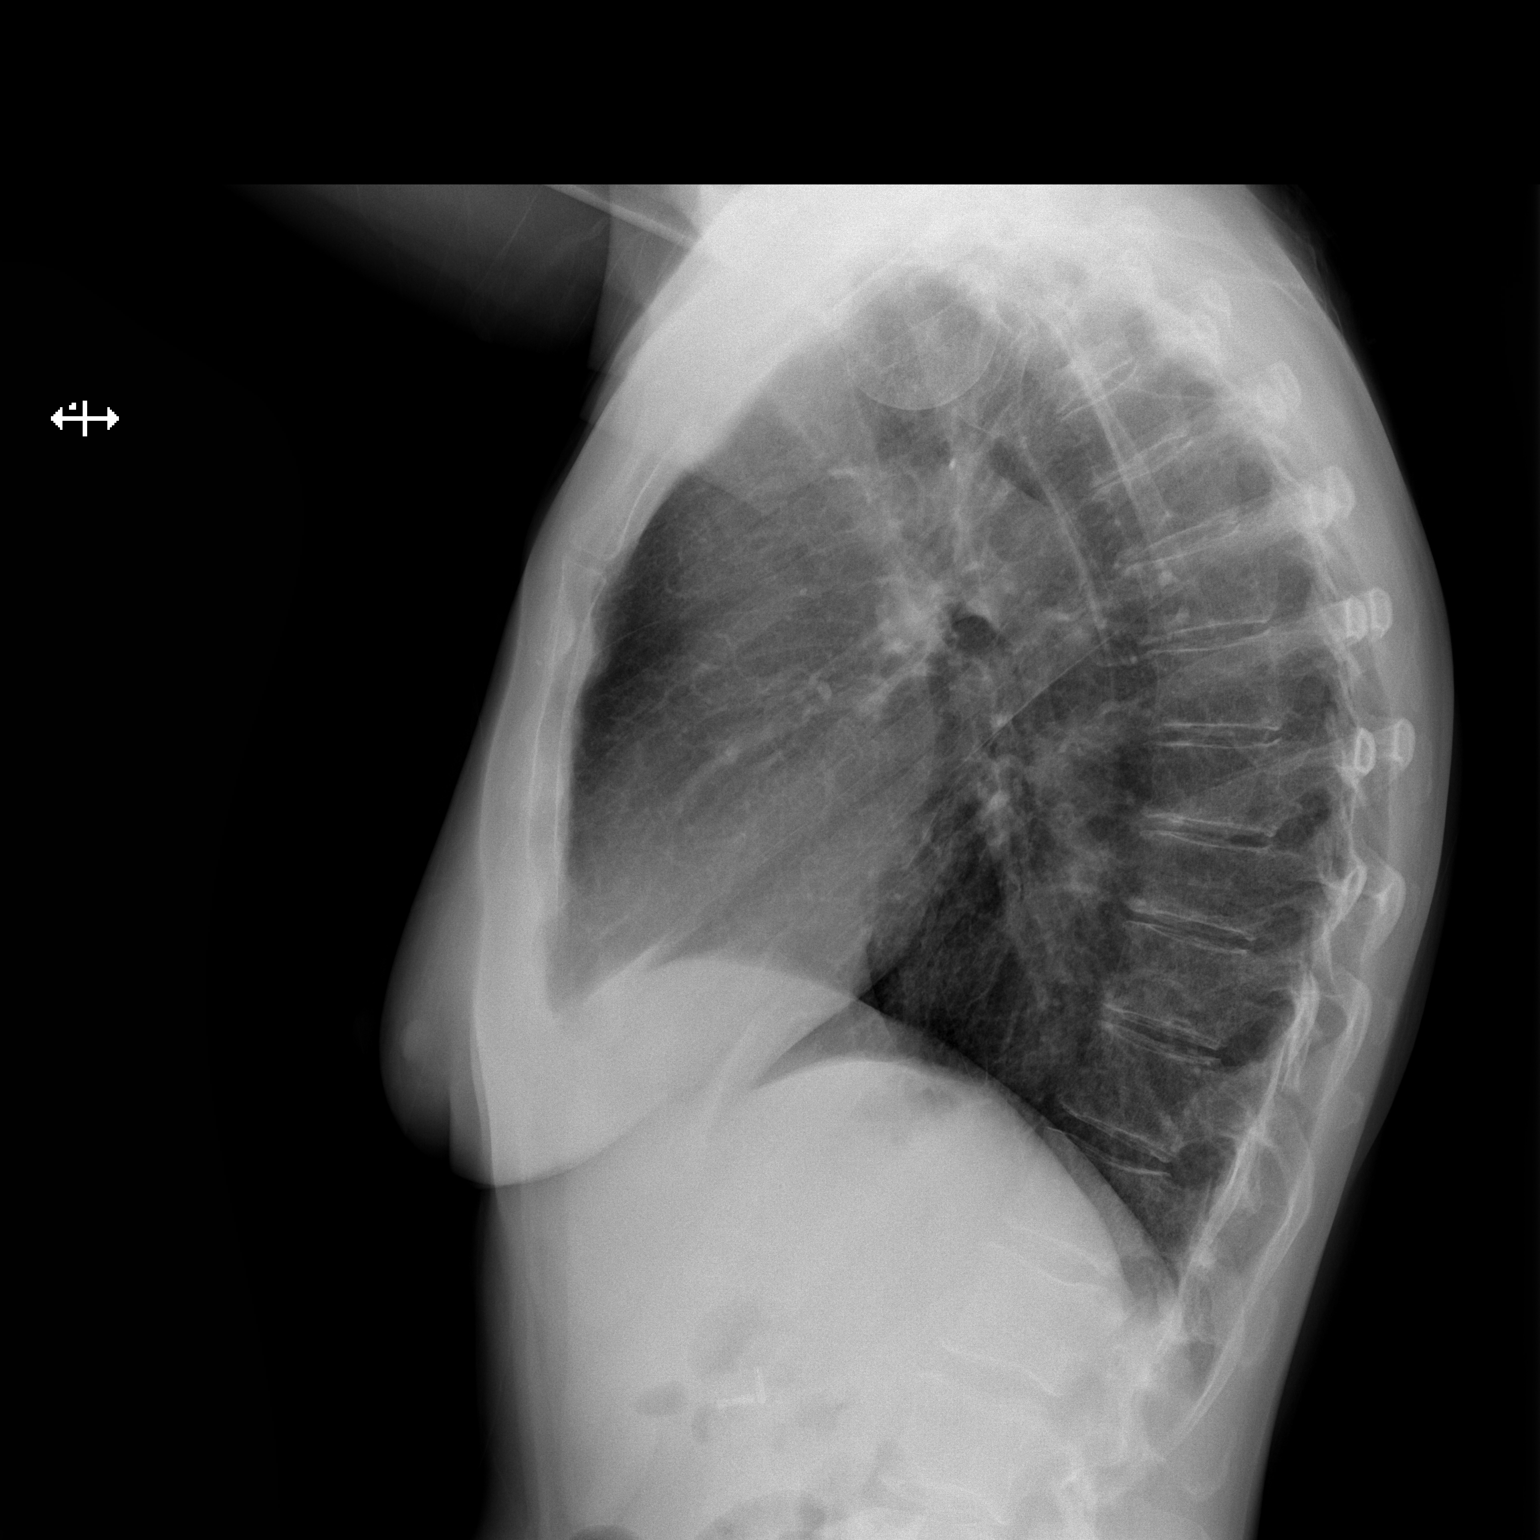

[2 of 2 positions shown; findings below may reference images not displayed]

FINDINGS: The heart size and mediastinal contours are within normal limits.
Biapical pleuroparenchymal thickening and upper lobe scarring is
noted with small nodular densities seen some which appear calcified
and may represent sequela of old granulomatous disease possibly old
tuberculosis. No adenopathy. No pneumonic consolidation, effusion or
pneumothorax is seen. Cholecystectomy clips are seen the right upper
quadrant. Mild degenerative changes are noted along the dorsal
spine.
IMPRESSION: Biapical pleuroparenchymal scarring and subtle nodularity some of
which appear calcified and may represent old granulomatous disease
such as tuberculosis. Nonemergent baseline CT may help for further
correlation.No acute pneumonic consolidation.

## 2016-05-03 MED ORDER — PREDNISONE 20 MG PO TABS
60.0000 mg | ORAL_TABLET | ORAL | Status: AC
  Administered 2016-05-03: 60 mg via ORAL
  Filled 2016-05-03: qty 3

## 2016-05-03 MED ORDER — FAMOTIDINE 20 MG PO TABS
20.0000 mg | ORAL_TABLET | Freq: Once | ORAL | Status: AC
  Administered 2016-05-03: 20 mg via ORAL
  Filled 2016-05-03: qty 1

## 2016-05-03 MED ORDER — PREDNISONE 20 MG PO TABS: 40.0000 mg | ORAL_TABLET | Freq: Every day | ORAL | 0 refills | Status: DC

## 2016-05-03 MED ORDER — FAMOTIDINE 20 MG PO TABS: 20.0000 mg | ORAL_TABLET | Freq: Two times a day (BID) | ORAL | 0 refills | Status: DC

## 2016-05-03 MED ORDER — DIPHENHYDRAMINE HCL 25 MG PO TABS: 25.0000 mg | ORAL_TABLET | Freq: Three times a day (TID) | ORAL | 0 refills | Status: DC

## 2016-05-03 MED ORDER — DIPHENHYDRAMINE HCL 25 MG PO CAPS
25.0000 mg | ORAL_CAPSULE | Freq: Once | ORAL | Status: AC
  Administered 2016-05-03: 25 mg via ORAL
  Filled 2016-05-03: qty 1

## 2016-05-03 NOTE — ED Provider Notes (Signed)
[facility]-EMERGENCY DEPT Provider Note   CSN: [id number] Arrival date & time: 05/03/16  1146     History   Chief Complaint Chief Complaint  Patient presents with  . Rash    HPI Gabriela Campbell is a 51 y.o. female.  HPI Patient presents with concern of rash, weakness. Patient denies into medical problems beyond smoking, episode of shingles last year. She now presents with 2 days of illness. Over the past 2 days she has developed generalized weakness, soreness, as well as new itchy skin lesions primarily in the axilla, bilaterally, face bilaterally, and abdomen. Patient is concerned of recurrent shingles due to the discomfort from the skin lesions. She denies current fever, nausea, vomiting. No known precipitant, and since onset, no relief with OTC medication. Patient continues to smoke cigarettes.    Past Medical History:  Diagnosis Date  . Shingles     There are no active problems to display for this patient.   Past Surgical History:  Procedure Laterality Date  . ABDOMINAL HYSTERECTOMY    . CHOLECYSTECTOMY      OB History    No data available       Home Medications    Prior to Admission medications   Medication Sig Start Date End Date Taking? Authorizing Provider  cholecalciferol (VITAMIN D) 1000 units tablet Take 1,000 Units by mouth daily.   Yes Historical Provider, MD  vitamin B-12 (CYANOCOBALAMIN) 250 MCG tablet Take 250 mcg by mouth daily.   Yes Historical Provider, MD  vitamin C (ASCORBIC ACID) 500 MG tablet Take 500 mg by mouth daily.   Yes Historical Provider, MD  cyclobenzaprine (FLEXERIL) 10 MG tablet Take 1 tablet (10 mg total) by mouth 2 (two) times daily as needed for muscle spasms. Patient not taking: Reported on 05/03/2016 04/17/16   Mia A McDonald, PA-C    Family History No family history on file.  Social History Social History  Substance Use Topics  . Smoking status: Current Every Day Smoker    Packs/day: 1.00    Types: Cigarettes  .  Smokeless tobacco: Never Used  . Alcohol use No     Allergies   Ibuprofen; Iodine; Tramadol; Codeine; Dilaudid [hydromorphone hcl]; and Penicillins   Review of Systems Review of Systems  Constitutional:       Per HPI, otherwise negative  HENT:       Per HPI, otherwise negative  Respiratory:       Per HPI, otherwise negative  Cardiovascular:       Per HPI, otherwise negative  Gastrointestinal: Negative for vomiting.  Endocrine:       Negative aside from HPI  Genitourinary:       Neg aside from HPI   Musculoskeletal:       Per HPI, otherwise negative  Skin: Positive for rash.  Allergic/Immunologic: Negative for immunocompromised state.  Neurological: Positive for weakness. Negative for syncope.     Physical Exam Updated Vital Signs BP (!) 152/98 (BP Location: Left Arm)   Pulse 71   Temp 98.4 F (36.9 C) (Oral)   Resp 18   Ht 5\' 2"  (1.575 m)   Wt 103 lb (46.7 kg)   SpO2 99%   BMI 18.84 kg/m   Physical Exam  Constitutional: She is oriented to person, place, and time. She has a sickly appearance. No distress.  HENT:  Head: Normocephalic and atraumatic.  Eyes: Conjunctivae and EOM are normal.  Cardiovascular: Normal rate and regular rhythm.   Pulmonary/Chest: Effort normal and breath  sounds normal. No stridor. No respiratory distress.  Abdominal: She exhibits no distension.  Musculoskeletal: She exhibits no edema.  Neurological: She is alert and oriented to person, place, and time. No cranial nerve deficit.  Skin: Skin is warm and dry.  Psychiatric: She has a normal mood and affect.  Nursing note and vitals reviewed.    ED Treatments / Results  Labs (all labs ordered are listed, but only abnormal results are displayed) Labs Reviewed  CBC WITH DIFFERENTIAL/PLATELET - Abnormal; Notable for the following:       Result Value   WBC 12.4 (*)    Neutro Abs 9.0 (*)    All other components within normal limits  COMPREHENSIVE METABOLIC PANEL    Radiology Dg  Chest 2 View  Result Date: 05/03/2016 CLINICAL DATA:  Patient reports rash under left arm 10 to the back of head x2 weeks. History of shingles. Tobacco user. EXAM: CHEST  2 VIEW COMPARISON:  None. FINDINGS: The heart size and mediastinal contours are within normal limits. Biapical pleuroparenchymal thickening and upper lobe scarring is noted with small nodular densities seen some which appear calcified and may represent sequela of old granulomatous disease possibly old tuberculosis. No adenopathy. No pneumonic consolidation, effusion or pneumothorax is seen. Cholecystectomy clips are seen the right upper quadrant. Mild degenerative changes are noted along the dorsal spine. IMPRESSION: Biapical pleuroparenchymal scarring and subtle nodularity some of which appear calcified and may represent old granulomatous disease such as tuberculosis. Nonemergent baseline CT may help for further correlation.No acute pneumonic consolidation. Electronically Signed   By: Tollie Eth M.D.   On: 05/03/2016 14:09    Procedures Procedures (including critical care time)  Medications Ordered in ED Medications  diphenhydrAMINE (BENADRYL) capsule 25 mg (25 mg Oral Given 05/03/16 1423)  famotidine (PEPCID) tablet 20 mg (20 mg Oral Given 05/03/16 1422)  predniSONE (DELTASONE) tablet 60 mg (60 mg Oral Given 05/03/16 1422)     Initial Impression / Assessment and Plan / ED Course  I have reviewed the triage vital signs and the nursing notes.  Pertinent labs & imaging results that were available during my care of the patient were reviewed by me and considered in my medical decision making (see chart for details).  Repeat exam the patient is in no distress. I reviewed all findings with her. With reassuring labs, sent for mild leukocytosis, no evidence for shingles, no evidence for bacteremia or sepsis, the patient will be discharged with a short course of antihistamines, steroids, primary care follow-up.   Final Clinical  Impressions(s) / ED Diagnoses  Rash   Gerhard Munch, MD 05/03/16 [phone]

## 2016-05-03 NOTE — ED Triage Notes (Signed)
Patient reports rash under left arm and to the back or left head x2 weeks. Patient has hx of shingles (had it last year). States her arm itches.

## 2016-05-03 NOTE — ED Notes (Signed)
Bed: ZO10 Expected date:  Expected time:  Means of arrival:  Comments: Triage 3 (Rash)

## 2016-05-03 NOTE — Discharge Instructions (Signed)
As discussed, your evaluation today has been largely reassuring.  But, it is important that you monitor your condition carefully, and do not hesitate to return to the ED if you develop new, or concerning changes in your condition. ? ?Otherwise, please follow-up with your physician for appropriate ongoing care. ? ?

## 2016-05-08 ENCOUNTER — Emergency Department (HOSPITAL_COMMUNITY)
Admission: EM | Admit: 2016-05-08 | Discharge: 2016-05-08 | Disposition: A | Discharge status: Home / Self Care | Payer: Self-pay | Attending: Emergency Medicine | Admitting: Emergency Medicine

## 2016-05-08 ENCOUNTER — Emergency Department (HOSPITAL_COMMUNITY): Payer: Self-pay

## 2016-05-08 ENCOUNTER — Encounter (HOSPITAL_COMMUNITY): Payer: Self-pay

## 2016-05-08 DIAGNOSIS — Y999 Unspecified external cause status: Secondary | ICD-10-CM | POA: Insufficient documentation

## 2016-05-08 DIAGNOSIS — S39011A Strain of muscle, fascia and tendon of abdomen, initial encounter: Secondary | ICD-10-CM | POA: Insufficient documentation

## 2016-05-08 DIAGNOSIS — Y929 Unspecified place or not applicable: Secondary | ICD-10-CM | POA: Insufficient documentation

## 2016-05-08 DIAGNOSIS — Y939 Activity, unspecified: Secondary | ICD-10-CM | POA: Insufficient documentation

## 2016-05-08 DIAGNOSIS — X58XXXA Exposure to other specified factors, initial encounter: Secondary | ICD-10-CM | POA: Insufficient documentation

## 2016-05-08 DIAGNOSIS — R109 Unspecified abdominal pain: Secondary | ICD-10-CM

## 2016-05-08 DIAGNOSIS — T148XXA Other injury of unspecified body region, initial encounter: Secondary | ICD-10-CM

## 2016-05-08 DIAGNOSIS — Z79899 Other long term (current) drug therapy: Secondary | ICD-10-CM | POA: Insufficient documentation

## 2016-05-08 DIAGNOSIS — F17210 Nicotine dependence, cigarettes, uncomplicated: Secondary | ICD-10-CM | POA: Insufficient documentation

## 2016-05-08 LAB — CBC WITH DIFFERENTIAL/PLATELET
BASOS ABS: 0 10*3/uL (ref 0.0–0.1)
Basophils Relative: 0 %
EOS PCT: 0 %
Eosinophils Absolute: 0 10*3/uL (ref 0.0–0.7)
HEMATOCRIT: 45.5 % (ref 36.0–46.0)
Hemoglobin: 15.8 g/dL — ABNORMAL HIGH (ref 12.0–15.0)
Lymphocytes Relative: 23 %
Lymphs Abs: 3.5 10*3/uL (ref 0.7–4.0)
MCH: 32.3 pg (ref 26.0–34.0)
MCHC: 34.7 g/dL (ref 30.0–36.0)
MCV: 93 fL (ref 78.0–100.0)
MONO ABS: 0.8 10*3/uL (ref 0.1–1.0)
Monocytes Relative: 5 %
NEUTROS ABS: 11.3 10*3/uL — AB (ref 1.7–7.7)
Neutrophils Relative %: 72 %
Platelets: 209 10*3/uL (ref 150–400)
RBC: 4.89 MIL/uL (ref 3.87–5.11)
RDW: 13.2 % (ref 11.5–15.5)
WBC: 15.6 10*3/uL — ABNORMAL HIGH (ref 4.0–10.5)

## 2016-05-08 LAB — BASIC METABOLIC PANEL
ANION GAP: 9 (ref 5–15)
BUN: 10 mg/dL (ref 6–20)
CO2: 33 mmol/L — AB (ref 22–32)
Calcium: 9.4 mg/dL (ref 8.9–10.3)
Chloride: 101 mmol/L (ref 101–111)
Creatinine, Ser: 0.76 mg/dL (ref 0.44–1.00)
Glucose, Bld: 102 mg/dL — ABNORMAL HIGH (ref 65–99)
Potassium: 3.9 mmol/L (ref 3.5–5.1)
SODIUM: 143 mmol/L (ref 135–145)

## 2016-05-08 LAB — HEPATIC FUNCTION PANEL
ALT: 7 U/L — ABNORMAL LOW (ref 14–54)
AST: 13 U/L — AB (ref 15–41)
Albumin: 4.1 g/dL (ref 3.5–5.0)
Alkaline Phosphatase: 149 U/L — ABNORMAL HIGH (ref 38–126)
BILIRUBIN TOTAL: 0.2 mg/dL — AB (ref 0.3–1.2)
Total Protein: 7.6 g/dL (ref 6.5–8.1)

## 2016-05-08 LAB — URINALYSIS, ROUTINE W REFLEX MICROSCOPIC
Bilirubin Urine: NEGATIVE
Glucose, UA: NEGATIVE mg/dL
Ketones, ur: NEGATIVE mg/dL
Leukocytes, UA: NEGATIVE
Nitrite: NEGATIVE
PH: 7 (ref 5.0–8.0)
Protein, ur: NEGATIVE mg/dL
SPECIFIC GRAVITY, URINE: 1.005 (ref 1.005–1.030)

## 2016-05-08 IMAGING — CT CT RENAL STONE PROTOCOL
2 of 3 series · 16 of 46 positions shown, 18 images · non-contrast
Comparison: None available

CLINICAL DATA: RIGHT FLANK PAIN ONSET YESTERDAY INCREASE IN URINARY
FREQUENCY Patient states she was seen last week for itching and was
given Prednisone. The itching went away and started back yesterday
on her face and scalp.

EXAM:
CT ABDOMEN AND PELVIS WITHOUT CONTRAST
TECHNIQUE: Multidetector CT imaging of the abdomen and pelvis was performed
following the standard protocol without IV contrast.

[Series 4: lung · axial · 0.57mm/px · z∈[-186,-78]mm · 13 of 64 slices shown, 15 images]
[im 5/64  soft-tissue]
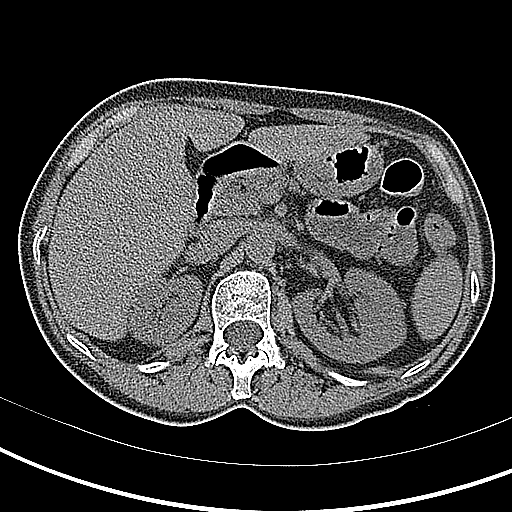
[im 5/64  bone]
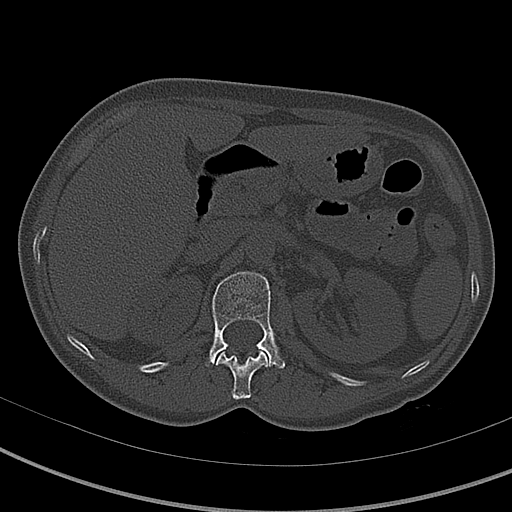
[im 9/64  soft-tissue]
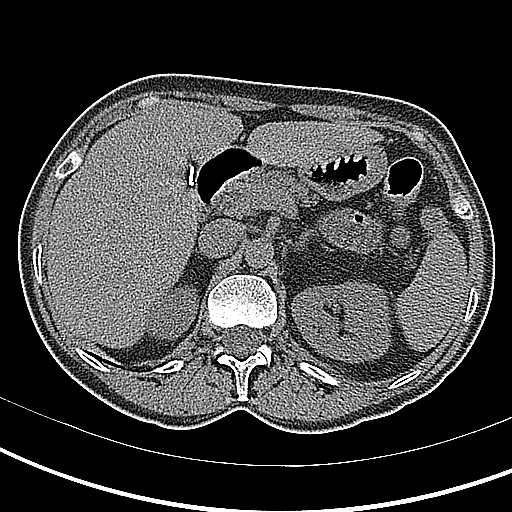
[im 13/64  soft-tissue]
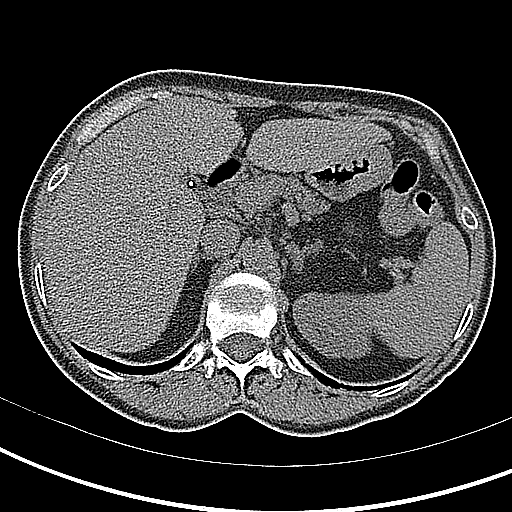
[im 19/64  soft-tissue]
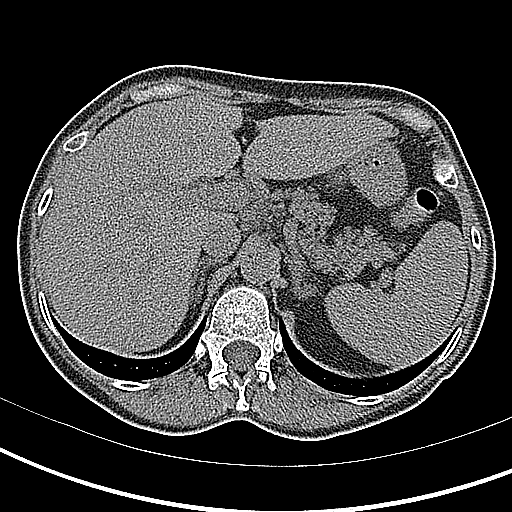
[im 23/64  soft-tissue]
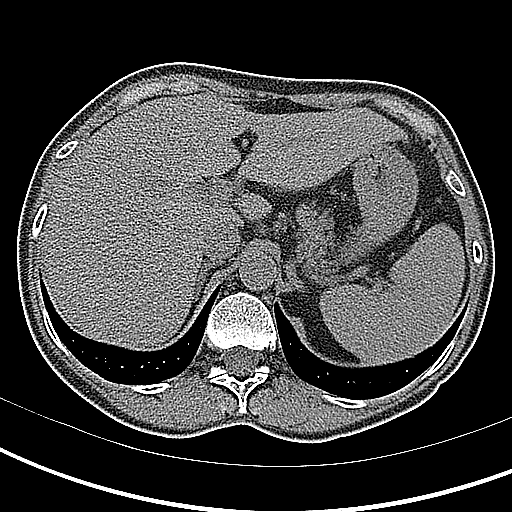
[im 27/64  soft-tissue]
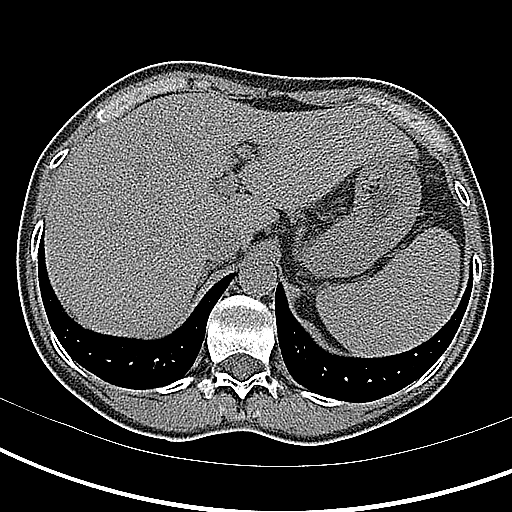
[im 33/64  soft-tissue]
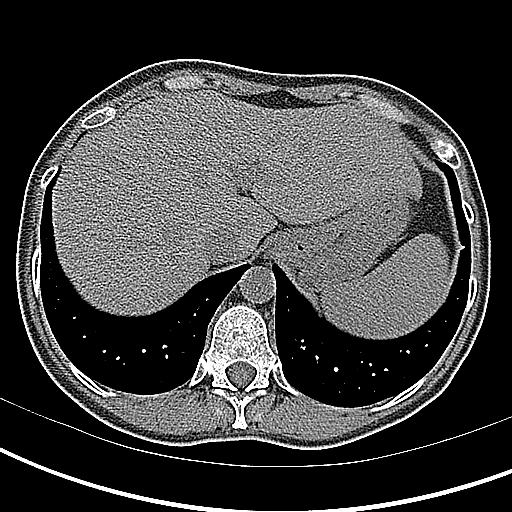
[im 37/64  soft-tissue]
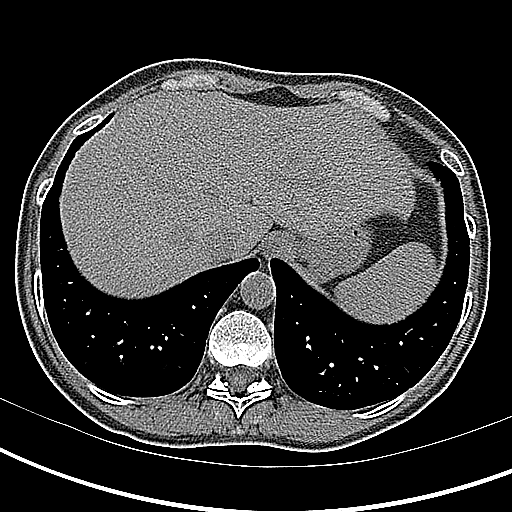
[im 41/64  soft-tissue]
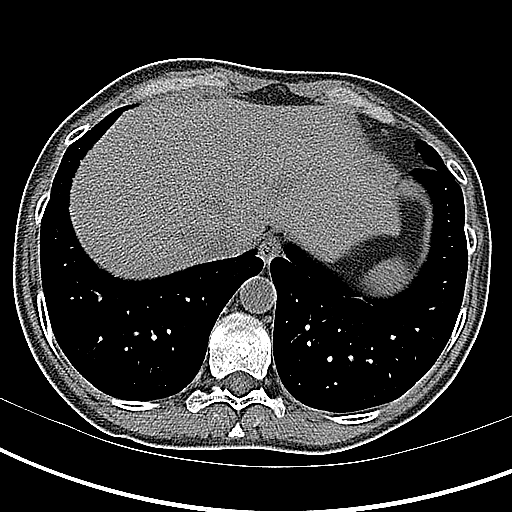
[im 41/64  bone]
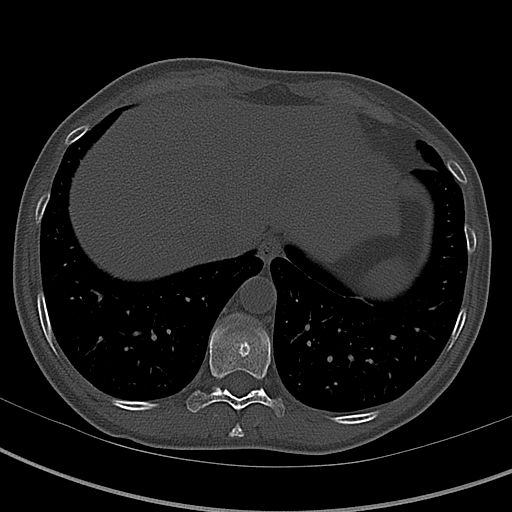
[im 45/64  soft-tissue]
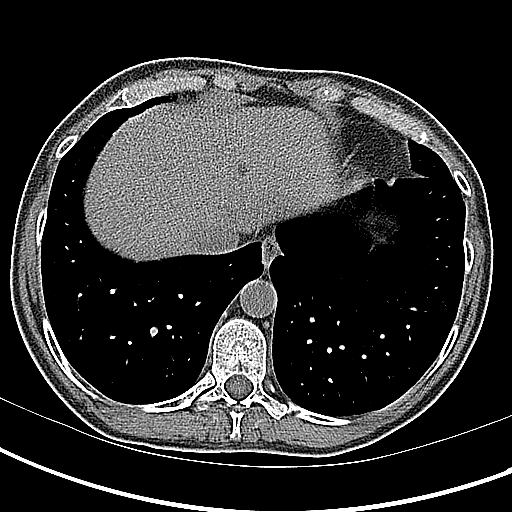
[im 51/64  soft-tissue]
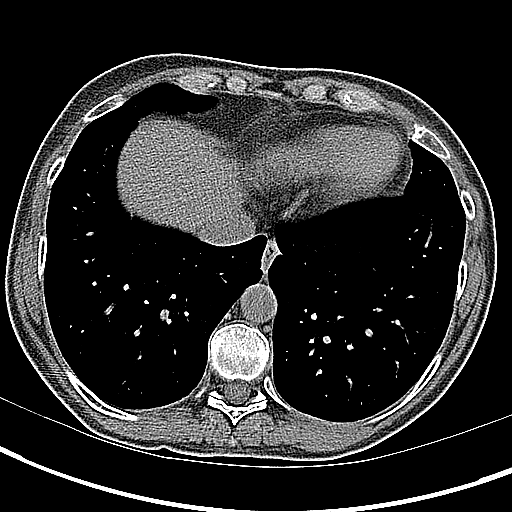
[im 55/64  soft-tissue]
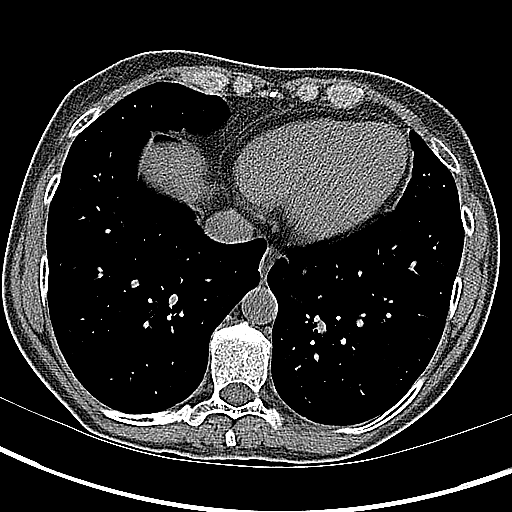
[im 59/64  soft-tissue]
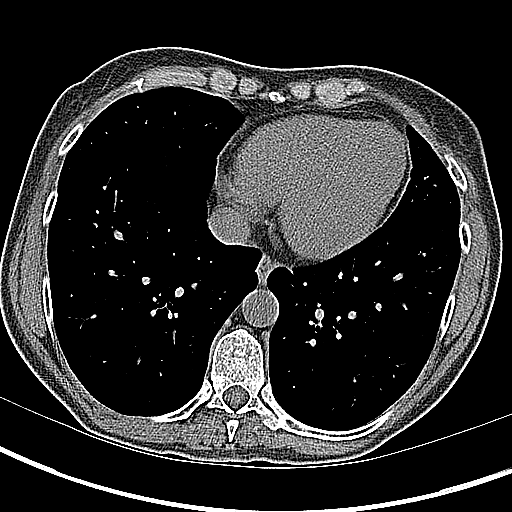

[Series 5: coronal · coronal · 0.65mm/px · 3 of 119 slices shown]
[im 40/119  soft-tissue]
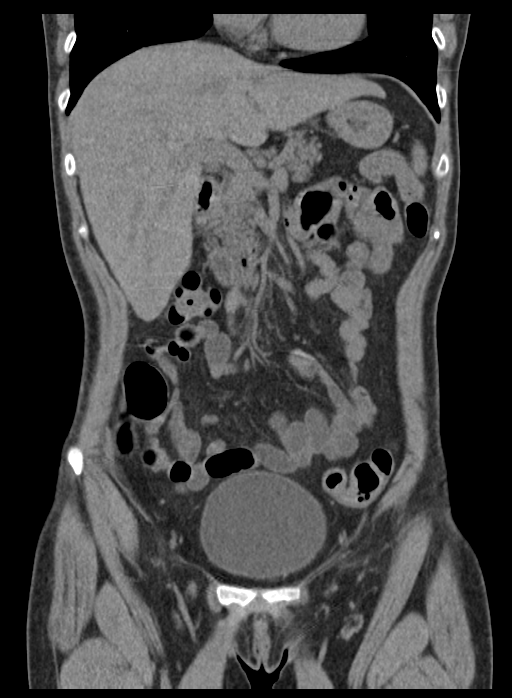
[im 53/119  soft-tissue]
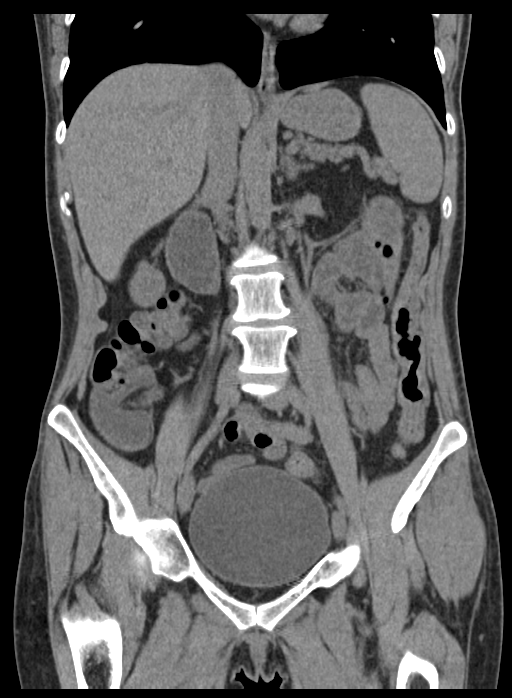
[im 66/119  soft-tissue]
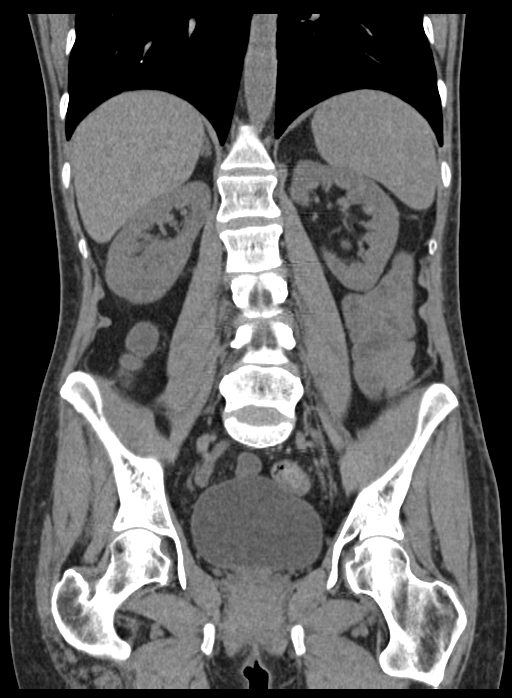

[16 of 46 positions shown; findings below may reference images not displayed]

FINDINGS: Lower chest: No acute abnormality.

Hepatobiliary: No focal liver abnormality is seen. Status post
cholecystectomy. No biliary dilatation.

Pancreas: Unremarkable. No pancreatic ductal dilatation or
surrounding inflammatory changes.

Spleen: Normal in size without focal abnormality.

Adrenals/Urinary Tract: Adrenal glands are unremarkable. Kidneys are
normal, without renal calculi, focal lesion, or hydronephrosis.
Bladder is unremarkable.

Stomach/Bowel: Stomach is within normal limits. Appendix not
identified. No evidence of bowel wall thickening, distention, or
inflammatory changes.

Vascular/Lymphatic: No significant vascular findings are present. No
enlarged abdominal or pelvic lymph nodes. Bilateral pelvic
phleboliths.

Reproductive: Status post hysterectomy. No adnexal masses.

Other: No ascites.  No free air.

Musculoskeletal: Mild narrowing of the L3-4 and L4-5 interspaces. No
fracture or other acute bone abnormality. No worrisome bone lesions.
IMPRESSION: 1. No acute findings.
2. Lower lumbar degenerative disc disease.

## 2016-05-08 MED ORDER — FENTANYL CITRATE (PF) 100 MCG/2ML IJ SOLN
50.0000 ug | Freq: Once | INTRAMUSCULAR | Status: AC
  Administered 2016-05-08: 50 ug via INTRAVENOUS
  Filled 2016-05-08: qty 2

## 2016-05-08 MED ORDER — SODIUM CHLORIDE 0.9 % IV BOLUS (SEPSIS)
1000.0000 mL | Freq: Once | INTRAVENOUS | Status: AC
  Administered 2016-05-08: 1000 mL via INTRAVENOUS

## 2016-05-08 MED ORDER — ONDANSETRON HCL 4 MG PO TABS: 4.0000 mg | ORAL_TABLET | Freq: Four times a day (QID) | ORAL | 0 refills | Status: DC

## 2016-05-08 MED ORDER — FENTANYL CITRATE (PF) 100 MCG/2ML IJ SOLN: 100.0000 ug | Freq: Once | INTRAMUSCULAR | Status: DC

## 2016-05-08 MED ORDER — ONDANSETRON HCL 4 MG/2ML IJ SOLN
4.0000 mg | Freq: Once | INTRAMUSCULAR | Status: AC
  Administered 2016-05-08: 4 mg via INTRAVENOUS
  Filled 2016-05-08: qty 2

## 2016-05-08 MED ORDER — HYDROCODONE-ACETAMINOPHEN 5-325 MG PO TABS: 2.0000 | ORAL_TABLET | ORAL | 0 refills | Status: DC | PRN

## 2016-05-08 NOTE — ED Provider Notes (Signed)
[facility]-EMERG[facility] DEPT Provider Note   CSN: [id number] Arrival date & time: 05/08/16  1007     History   Chief Complaint Chief Complaint  Patient presents with  . Flank Pain  . Pruritis  . Urinary Frequency    HPI Gabriela Campbell is a 51 y.o. female who presents with 2 days of worsening right flank pain. She states that the pain is a constant sharp ache and states this currently and 9/10. She states that since onset of pain she has had difficulty finding a comfortable position. She has applied ice and heat and taken over-the-counter Tylenol with no relief of pain. She states that pain is worse with sudden movements denies any alleviating factors. She has no history of kidney stones. She reports associated nausea but no vomiting. She denies any dysuria or hematuria. She denies any fever. She also reports some persistent pruritus to her scalp and forehead. She was seen for the same at [facility] in ED on 05/03/16 and was prescribed short course of prednisone. She reports some mild improvement with prednisone but states she still having some pruritus.  The history is provided by the patient.    Past Medical History:  Diagnosis Date  . Shingles     There are no active problems to display for this patient.   Past Surgical History:  Procedure Laterality Date  . ABDOMINAL HYSTERECTOMY    . CHOLECYSTECTOMY      OB History    No data available       Home Medications    Prior to Admission medications   Medication Sig Start Date End Date Taking? Authorizing Provider  cholecalciferol (VITAMIN D) 1000 units tablet Take 1,000 Units by mouth daily.   Yes [provider]  vitamin B-12 (CYANOCOBALAMIN) 250 MCG tablet Take 250 mcg by mouth daily.   Yes [provider]  vitamin C (ASCORBIC ACID) 500 MG tablet Take 500 mg by mouth daily.   Yes [provider]  cyclobenzaprine (FLEXERIL) 10 MG tablet Take 1 tablet (10 mg total) by mouth 2 (two) times daily as  needed for muscle spasms. Patient not taking: Reported on 05/03/2016 04/17/16   McDonald, Mia A, PA-C  diphenhydrAMINE (BENADRYL) 25 MG tablet Take 1 tablet (25 mg total) by mouth 3 (three) times daily. Take one tablet three times daily for two days Patient not taking: Reported on 05/08/2016 05/03/16   Gerhard Munch, MD  famotidine (PEPCID) 20 MG tablet Take 1 tablet (20 mg total) by mouth 2 (two) times daily. Take one tablet twice daily for two days Patient not taking: Reported on 05/08/2016 05/03/16   Gerhard Munch, MD  HYDROcodone-acetaminophen (NORCO/VICODIN) 5-325 MG tablet Take 2 tablets by mouth every 4 (four) hours as needed. 05/08/16   Maxwell Caul, PA-C  ondansetron (ZOFRAN) 4 MG tablet Take 1 tablet (4 mg total) by mouth every 6 (six) hours. 05/08/16   Maxwell Caul, PA-C  predniSONE (DELTASONE) 20 MG tablet Take 2 tablets (40 mg total) by mouth daily with breakfast. For the next four days Patient not taking: Reported on 05/08/2016 05/03/16   Gerhard Munch, MD    Family History History reviewed. No pertinent family history.  Social History Social History  Substance Use Topics  . Smoking status: Current Every Day Smoker    Packs/day: 1.00    Types: Cigarettes  . Smokeless tobacco: Never Used  . Alcohol use No     Allergies   Ibuprofen; Iodine; Tramadol; Codeine; Dilaudid [hydromorphone hcl];  and Penicillins   Review of Systems Review of Systems  Constitutional: Negative for chills and fever.  HENT: Negative for congestion, rhinorrhea and sore throat.   Eyes: Negative for visual disturbance.  Respiratory: Negative for cough and shortness of breath.   Cardiovascular: Negative for chest pain.  Gastrointestinal: Positive for nausea. Negative for abdominal pain, blood in stool, diarrhea and vomiting.  Genitourinary: Positive for flank pain. Negative for dysuria and hematuria.  Musculoskeletal: Negative for back pain and neck pain.  Skin: Negative for rash.       +  pruritus  Neurological: Negative for weakness and numbness.  Psychiatric/Behavioral: Negative for confusion.  All other systems reviewed and are negative.    Physical Exam Updated Vital Signs BP (!) 146/86 (BP Location: Right Arm)   Pulse 62   Temp 98.1 F (36.7 C) (Oral)   Resp 16   Ht 5\' 2"  (1.575 m)   Wt 46.7 kg   SpO2 100%   BMI 18.84 kg/m   Physical Exam  Constitutional: She is oriented to person, place, and time. She appears well-developed and well-nourished.  Constantly moving around in examination table throughout interview.  HENT:  Head: Normocephalic and atraumatic.  Mouth/Throat: Oropharynx is clear and moist and mucous membranes are normal.  No lip or tongue angioedema.  Eyes: Conjunctivae, EOM and lids are normal. Pupils are equal, round, and reactive to light.  Neck: Full passive range of motion without pain.  Cardiovascular: Normal rate, regular rhythm, normal heart sounds and normal pulses.  Exam reveals no gallop and no friction rub.   No murmur heard. Pulmonary/Chest: Effort normal and breath sounds normal.  Abdominal: Soft. Normal appearance. There is tenderness. There is CVA tenderness (right). There is no rigidity, no guarding and no tenderness at McBurney's point.    Tenderness to the right lateral aspect of the abdomen. No McBurney's point tenderness or Murphy's sign.  Musculoskeletal: Normal range of motion.  Neurological: She is alert and oriented to person, place, and time.  Skin: Skin is warm and dry. Capillary refill takes less than 2 seconds.  Psychiatric: She has a normal mood and affect. Her speech is normal.  Nursing note and vitals reviewed.    ED Treatments / Results  Labs (all labs ordered are listed, but only abnormal results are displayed) Labs Reviewed  URINALYSIS, ROUTINE W REFLEX MICROSCOPIC - Abnormal; Notable for the following:       Result Value   Color, Urine STRAW (*)    Hgb urine dipstick SMALL (*)    Bacteria, UA RARE  (*)    Squamous Epithelial / LPF 0-5 (*)    All other components within normal limits  BASIC METABOLIC PANEL - Abnormal; Notable for the following:    CO2 33 (*)    Glucose, Bld 102 (*)    All other components within normal limits  CBC WITH DIFFERENTIAL/PLATELET - Abnormal; Notable for the following:    WBC 15.6 (*)    Hemoglobin 15.8 (*)    Neutro Abs 11.3 (*)    All other components within normal limits  HEPATIC FUNCTION PANEL - Abnormal; Notable for the following:    AST 13 (*)    ALT 7 (*)    Alkaline Phosphatase 149 (*)    Total Bilirubin 0.2 (*)    Bilirubin, Direct <0.1 (*)    All other components within normal limits    EKG  EKG Interpretation None       Radiology Ct Renal Stone Study  Result Date: 05/08/2016 CLINICAL DATA:  RIGHT FLANK PAIN ONSET YESTERDAY INCREASE IN URINARY FREQUENCY Patient states she was seen last week for itching and was given Prednisone. The itching went away and started back yesterday on her face and scalp. EXAM: CT ABDOMEN AND PELVIS WITHOUT CONTRAST TECHNIQUE: Multidetector CT imaging of the abdomen and pelvis was performed following the standard protocol without IV contrast. COMPARISON:  None available FINDINGS: Lower chest: No acute abnormality. Hepatobiliary: No focal liver abnormality is seen. Status post cholecystectomy. No biliary dilatation. Pancreas: Unremarkable. No pancreatic ductal dilatation or surrounding inflammatory changes. Spleen: Normal in size without focal abnormality. Adrenals/Urinary Tract: Adrenal glands are unremarkable. Kidneys are normal, without renal calculi, focal lesion, or hydronephrosis. Bladder is unremarkable. Stomach/Bowel: Stomach is within normal limits. Appendix not identified. No evidence of bowel wall thickening, distention, or inflammatory changes. Vascular/Lymphatic: No significant vascular findings are present. No enlarged abdominal or pelvic lymph nodes. Bilateral pelvic phleboliths. Reproductive: Status post  hysterectomy. No adnexal masses. Other: No ascites.  No free air. Musculoskeletal: Mild narrowing of the L3-4 and L4-5 interspaces. No fracture or other acute bone abnormality. No worrisome bone lesions. IMPRESSION: 1. No acute findings. 2. Lower lumbar degenerative disc disease. Electronically Signed   By: Corlis Leak M.D.   On: 05/08/2016 15:50    Procedures Procedures (including critical care time)  Medications Ordered in ED Medications  sodium chloride 0.9 % bolus 1,000 mL (0 mLs Intravenous Stopped 05/08/16 1700)  ondansetron (ZOFRAN) injection 4 mg (4 mg Intravenous Given 05/08/16 1415)  fentaNYL (SUBLIMAZE) injection 50 mcg (50 mcg Intravenous Given 05/08/16 1416)  fentaNYL (SUBLIMAZE) injection 50 mcg (50 mcg Intravenous Given 05/08/16 1713)     Initial Impression / Assessment and Plan / ED Course  I have reviewed the triage vital signs and the nursing notes.  Pertinent labs & imaging results that were available during my care of the patient were reviewed by me and considered in my medical decision making (see chart for details).     51 year old woman who presents with 2 days of right flank pain associated with nausea. No fever or vomiting. No urinary complaint. Physical exam shows right CVA tenderness and right flank tenderness. Patient is afebrile, non-toxic appearing. Consider kidney stone versus muscular strain. History/physical examination concerning for appendicitis. Low suspicion for gallbladder pathology as patient has had a cholecystectomy. Urine ordered at triage. Positive for hemoglobin. Will plan to obtain CT renal stone scan for evaluation of kidney stone. CBC and BMP were ordered for evaluation of electrolyte imbalance. Analgesics and antiemetics provided in the department for symptomatically.  Labs reviewed. Mild bump in her white blood cell count. 05/03/16. BUN/creatinine on BMP within normal limits. CT results are pending.  CT renal scan shows no kidney stone. No hepatobiliary  abnormality seen. Will plan to order hepatic function panel to evaluate bilirubin given history of pruritus.  Hepatic function panel reviewed. Bili is within normal limits. Discussed results with patient. Her pain is controlled after Tylenol in the department and she has not had any nausea or vomiting since being in the department. Reevaluation shows the patient is stable for discharge at this time. Discussed with patient that her symptoms could be due to muscular strain especially given that it is worse with positional changes with her torso. Will send her home with symptomatic relief. Patient does not have a follow-up with primary care doctor. Provided patient with a list of clinic resources to use if he does not have a PCP. Instructed to call  them today to arrange follow-up in the next 24-48 hours. Should return precautions discussed. Patient expresses understanding and agreement to plan.     Final Clinical Impressions(s) / ED Diagnoses   Final diagnoses:  Muscle strain  Flank pain    New Prescriptions New Prescriptions   HYDROCODONE-ACETAMINOPHEN (NORCO/VICODIN) 5-325 MG TABLET    Take 2 tablets by mouth every 4 (four) hours as needed.   ONDANSETRON (ZOFRAN) 4 MG TABLET    Take 1 tablet (4 mg total) by mouth every 6 (six) hours.     Maxwell Caul, PA-C 05/08/16 1736    Tegeler, Canary Brim, MD 05/08/16 2008

## 2016-05-08 NOTE — ED Triage Notes (Signed)
Patient c/o right flank pain since yesterday. Patient also c/o urinary frequency.  Patient states she was seen last week for itching and was given Prednisone. The itching went away and started back yesterday on her face and scalp.

## 2016-05-08 NOTE — Discharge Instructions (Addendum)
Follow-up with referred clinics. Call them in the next 24-48 hours to arrange for an appointment.  Make sure you are  drinking plenty of fluids to stay hydrated.  Take medications as directed for pain and nausea.  Return to the emergency department for any fever, worsening pain, vomiting, chest pain, difficulty breathing pain with AP or any other worsening or concerning symptoms.  If you do not have a primary care doctor you see regularly, please you the list below. Please call them to arrange for follow-up.    No Primary Care Doctor Call [facility]  [phone] Other agencies that provide inexpensive medical care    [facility]  [phone]    [facility]  [phone]    [facility]  [phone]    [facility]  [phone]    [facility]  [phone]    [facility]  [phone]

## 2016-05-16 ENCOUNTER — Emergency Department (HOSPITAL_COMMUNITY): Payer: Self-pay

## 2016-05-16 ENCOUNTER — Encounter (HOSPITAL_COMMUNITY): Payer: Self-pay

## 2016-05-16 ENCOUNTER — Emergency Department (HOSPITAL_COMMUNITY)
Admission: EM | Admit: 2016-05-16 | Discharge: 2016-05-16 | Disposition: A | Discharge status: Home / Self Care | Payer: Self-pay | Attending: Emergency Medicine | Admitting: Emergency Medicine

## 2016-05-16 DIAGNOSIS — Z79899 Other long term (current) drug therapy: Secondary | ICD-10-CM | POA: Insufficient documentation

## 2016-05-16 DIAGNOSIS — R0600 Dyspnea, unspecified: Secondary | ICD-10-CM | POA: Insufficient documentation

## 2016-05-16 DIAGNOSIS — F17210 Nicotine dependence, cigarettes, uncomplicated: Secondary | ICD-10-CM | POA: Insufficient documentation

## 2016-05-16 DIAGNOSIS — R109 Unspecified abdominal pain: Secondary | ICD-10-CM | POA: Insufficient documentation

## 2016-05-16 LAB — CBC WITH DIFFERENTIAL/PLATELET
Basophils Absolute: 0 10*3/uL (ref 0.0–0.1)
Basophils Relative: 0 %
Eosinophils Absolute: 0.1 10*3/uL (ref 0.0–0.7)
Eosinophils Relative: 0 %
HCT: 54 % — ABNORMAL HIGH (ref 36.0–46.0)
Hemoglobin: 19.3 g/dL — ABNORMAL HIGH (ref 12.0–15.0)
Lymphocytes Relative: 17 %
Lymphs Abs: 2.9 10*3/uL (ref 0.7–4.0)
MCH: 33.1 pg (ref 26.0–34.0)
MCHC: 35.7 g/dL (ref 30.0–36.0)
MCV: 92.6 fL (ref 78.0–100.0)
Monocytes Absolute: 0.8 10*3/uL (ref 0.1–1.0)
Monocytes Relative: 5 %
Neutro Abs: 13.6 10*3/uL — ABNORMAL HIGH (ref 1.7–7.7)
Neutrophils Relative %: 78 %
Platelets: 248 10*3/uL (ref 150–400)
RBC: 5.83 MIL/uL — ABNORMAL HIGH (ref 3.87–5.11)
RDW: 13.3 % (ref 11.5–15.5)
WBC: 17.3 10*3/uL — ABNORMAL HIGH (ref 4.0–10.5)

## 2016-05-16 LAB — BASIC METABOLIC PANEL
Anion gap: 11 (ref 5–15)
BUN: 11 mg/dL (ref 6–20)
CO2: 29 mmol/L (ref 22–32)
Calcium: 9.9 mg/dL (ref 8.9–10.3)
Chloride: 97 mmol/L — ABNORMAL LOW (ref 101–111)
Creatinine, Ser: 0.95 mg/dL (ref 0.44–1.00)
GFR calc Af Amer: >60 mL/min (ref >60)
GFR calc non Af Amer: >60 mL/min (ref >60)
Glucose, Bld: 98 mg/dL (ref 65–99)
Potassium: 3.9 mmol/L (ref 3.5–5.1)
Sodium: 137 mmol/L (ref 135–145)

## 2016-05-16 LAB — URINALYSIS, ROUTINE W REFLEX MICROSCOPIC
Bacteria, UA: NONE SEEN
Bilirubin Urine: NEGATIVE
Glucose, UA: NEGATIVE mg/dL
Ketones, ur: NEGATIVE mg/dL
Leukocytes, UA: NEGATIVE
Nitrite: NEGATIVE
Protein, ur: NEGATIVE mg/dL
RBC / HPF: NONE SEEN RBC/hpf (ref 0–5)
Specific Gravity, Urine: 1.001 — ABNORMAL LOW (ref 1.005–1.030)
pH: 6 (ref 5.0–8.0)

## 2016-05-16 LAB — D-DIMER, QUANTITATIVE (NOT AT ARMC): D-Dimer, Quant: <0.27 ug/mL-FEU (ref 0.00–0.50)

## 2016-05-16 IMAGING — CR DG CHEST 2V
2 series · 2 of 2 positions shown · non-contrast
Comparison: [DATE]

CLINICAL DATA: Right chest pain and dyspnea for several weeks.

EXAM:
CHEST  2 VIEW

[w chest pa]
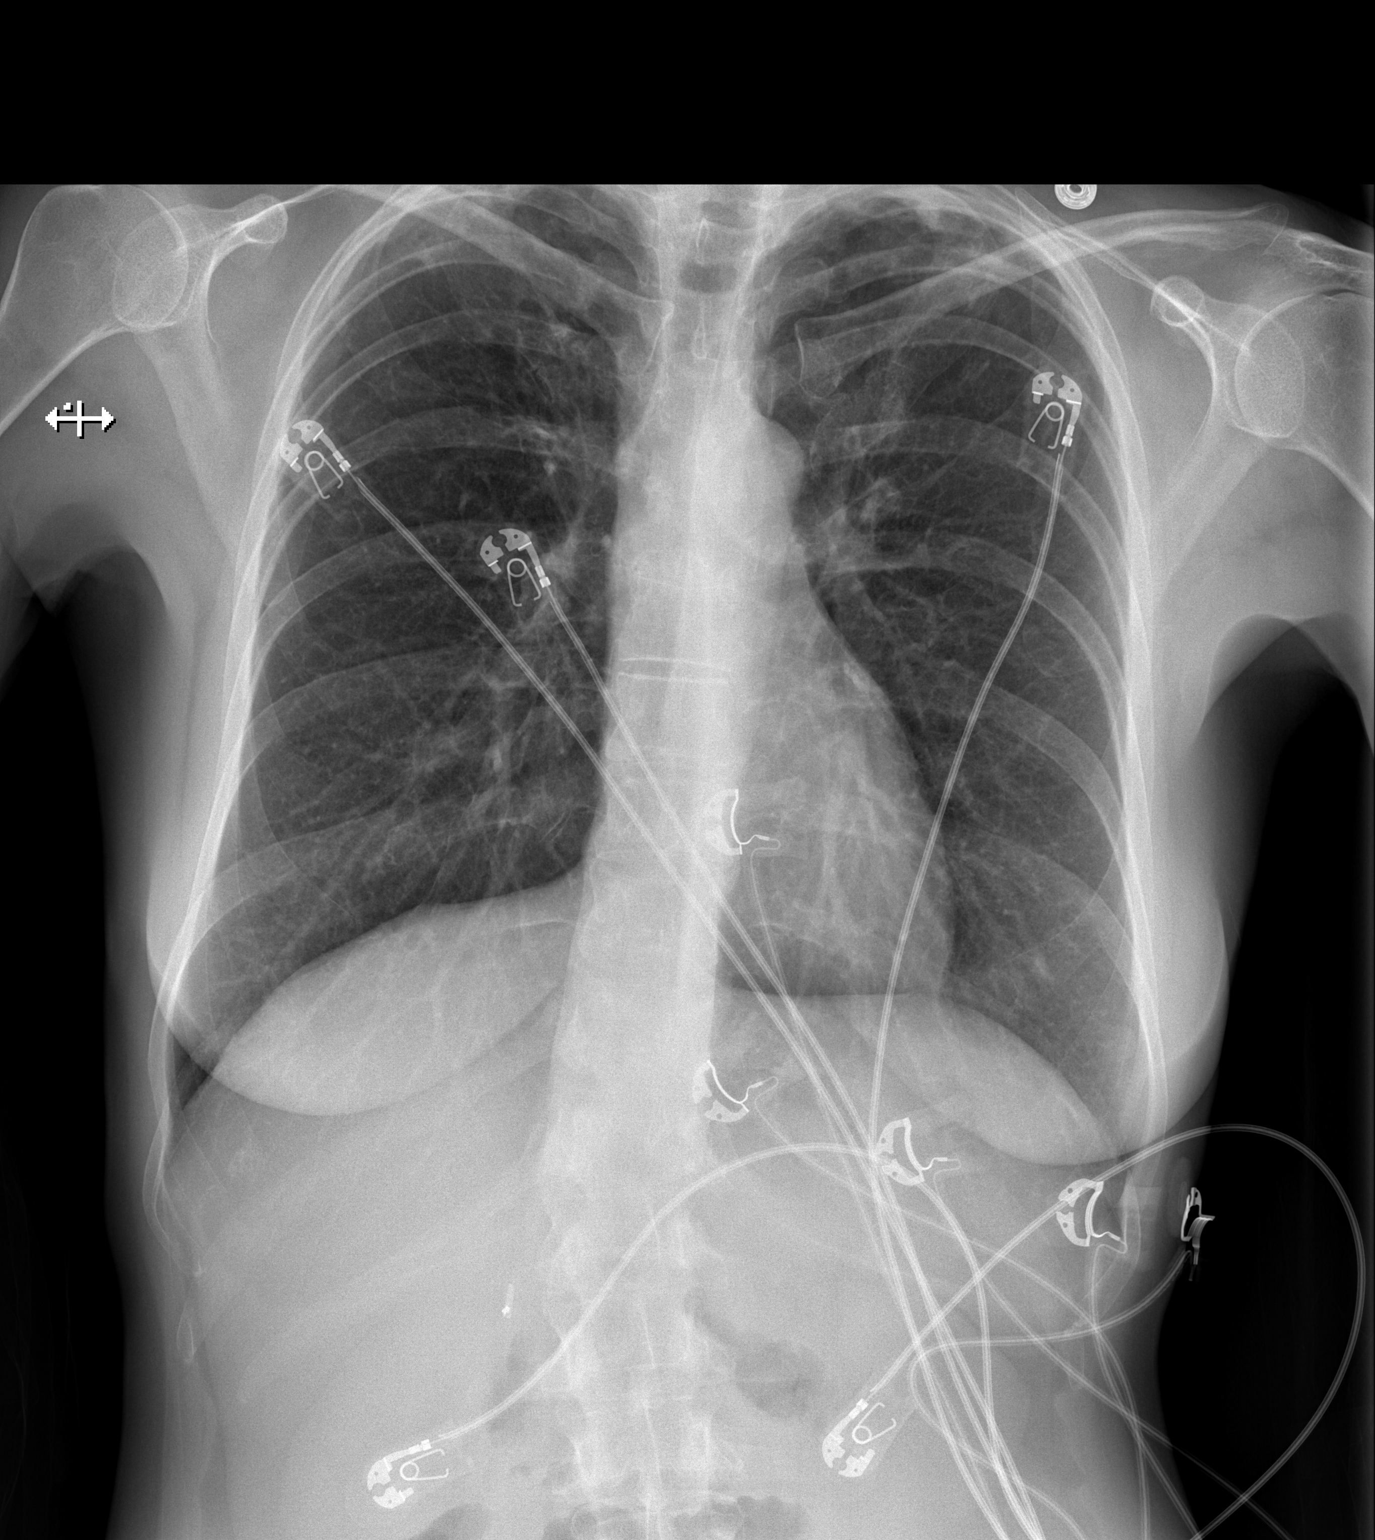

[w chest lat]
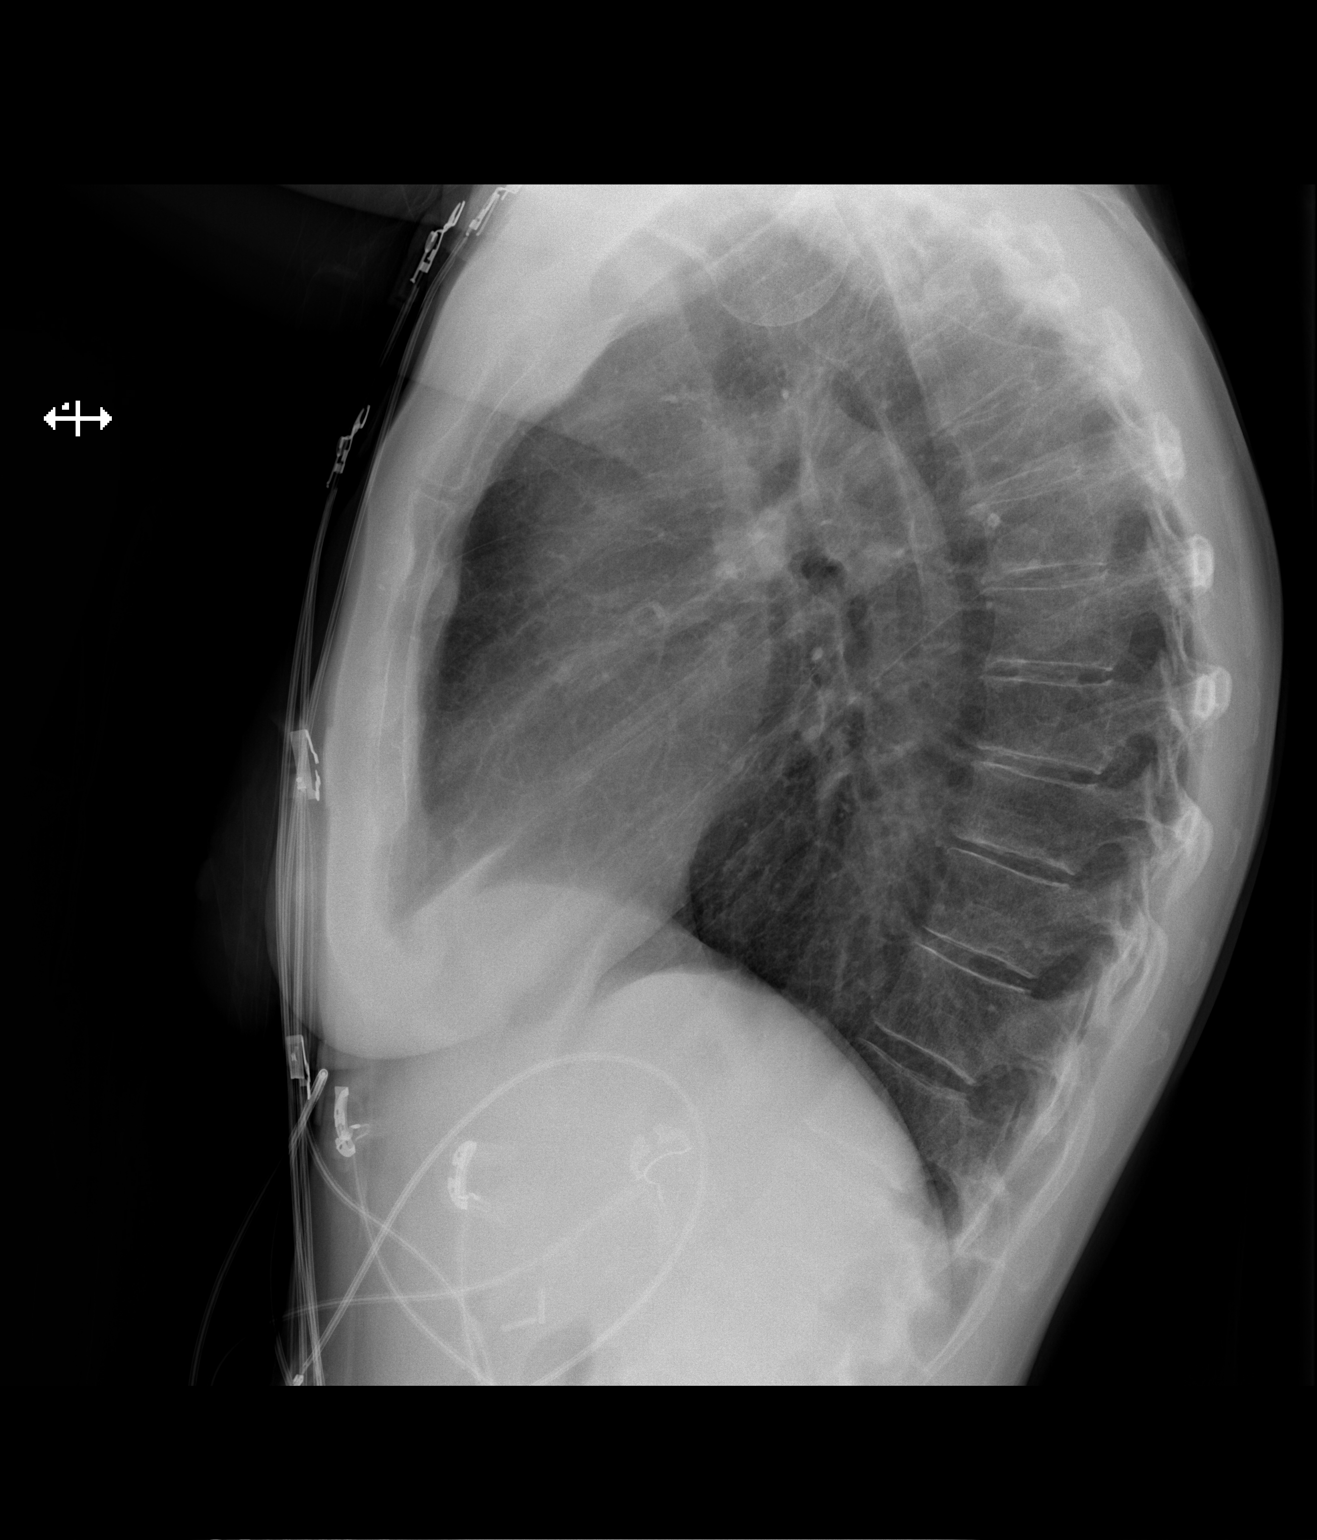

[2 of 2 positions shown; findings below may reference images not displayed]

FINDINGS: The heart size and mediastinal contours are within normal limits.
Biapical pleural-parenchymal scarring is stable. No evidence of
pulmonary airspace disease or edema. No evidence of pneumothorax or
pleural effusion. The visualized skeletal structures are
unremarkable.
IMPRESSION: Stable biapical scarring.  No active disease.

## 2016-05-16 MED ORDER — LORAZEPAM 2 MG/ML IJ SOLN
0.5000 mg | Freq: Once | INTRAMUSCULAR | Status: AC
  Administered 2016-05-16: 0.5 mg via INTRAVENOUS
  Filled 2016-05-16: qty 1

## 2016-05-16 MED ORDER — SODIUM CHLORIDE 0.9 % IV BOLUS (SEPSIS)
1000.0000 mL | Freq: Once | INTRAVENOUS | Status: AC
  Administered 2016-05-16: 1000 mL via INTRAVENOUS

## 2016-05-16 MED ORDER — HYDROCODONE-ACETAMINOPHEN 5-325 MG PO TABS
1.0000 | ORAL_TABLET | Freq: Once | ORAL | Status: AC
  Administered 2016-05-16: 1 via ORAL
  Filled 2016-05-16: qty 1

## 2016-05-16 NOTE — ED Provider Notes (Signed)
[facility]-EMERGENCY DEPT Provider Note   CSN: [id number] Arrival date & time: 05/16/16  1607     History   Chief Complaint Chief Complaint  Patient presents with  . Shortness of Breath    HPI Gabriela Campbell is a 51 y.o. female.  HPI  51 year old female with dyspnea. Acute onset approximately hour and half prior to arrival. She states that she feels like she cannot breath through her nose. She does not feel congested though. She is still having pain in her R flank/chest like she did on previous ED visit. She denies significant change in this. She is s/p cholecystectomy. No cough. No fever or chills. No unusual leg pain or swelling. Denies hx of dvt/pe.   Past Medical History:  Diagnosis Date  . Shingles     There are no active problems to display for this patient.   Past Surgical History:  Procedure Laterality Date  . ABDOMINAL HYSTERECTOMY    . CHOLECYSTECTOMY      OB History    No data available       Home Medications    Prior to Admission medications   Medication Sig Start Date End Date Taking? Authorizing Provider  cholecalciferol (VITAMIN D) 1000 units tablet Take 1,000 Units by mouth daily.   Yes [provider]  vitamin B-12 (CYANOCOBALAMIN) 250 MCG tablet Take 250 mcg by mouth daily.   Yes [provider]  vitamin C (ASCORBIC ACID) 500 MG tablet Take 500 mg by mouth daily.   Yes [provider]  cyclobenzaprine (FLEXERIL) 10 MG tablet Take 1 tablet (10 mg total) by mouth 2 (two) times daily as needed for muscle spasms. Patient not taking: Reported on 05/03/2016 04/17/16   McDonald, Mia A, PA-C  diphenhydrAMINE (BENADRYL) 25 MG tablet Take 1 tablet (25 mg total) by mouth 3 (three) times daily. Take one tablet three times daily for two days Patient not taking: Reported on 05/08/2016 05/03/16   Gerhard Munch, MD  famotidine (PEPCID) 20 MG tablet Take 1 tablet (20 mg total) by mouth 2 (two) times daily. Take one tablet twice daily for two  days Patient not taking: Reported on 05/08/2016 05/03/16   Gerhard Munch, MD  HYDROcodone-acetaminophen (NORCO/VICODIN) 5-325 MG tablet Take 2 tablets by mouth every 4 (four) hours as needed. Patient not taking: Reported on 05/16/2016 05/08/16   Graciella Freer A, PA-C  ondansetron (ZOFRAN) 4 MG tablet Take 1 tablet (4 mg total) by mouth every 6 (six) hours. Patient not taking: Reported on 05/16/2016 05/08/16   Maxwell Caul, PA-C  predniSONE (DELTASONE) 20 MG tablet Take 2 tablets (40 mg total) by mouth daily with breakfast. For the next four days Patient not taking: Reported on 05/08/2016 05/03/16   Gerhard Munch, MD    Family History History reviewed. No pertinent family history.  Social History Social History  Substance Use Topics  . Smoking status: Current Every Day Smoker    Packs/day: 1.00    Types: Cigarettes  . Smokeless tobacco: Never Used  . Alcohol use No     Allergies   Ibuprofen; Iodine; Tramadol; Codeine; Dilaudid [hydromorphone hcl]; and Penicillins   Review of Systems Review of Systems  All systems reviewed and negative, other than as noted in HPI.   Physical Exam Updated Vital Signs BP (!) 123/93 (BP Location: Left Arm)   Pulse 96   Temp 98.3 F (36.8 C) (Oral)   Resp 20   SpO2 100%   Physical Exam  Constitutional: She appears well-developed  and well-nourished. No distress.  HENT:  Head: Normocephalic and atraumatic.  Eyes: Conjunctivae are normal. Right eye exhibits no discharge. Left eye exhibits no discharge.  Neck: Neck supple.  Cardiovascular: Normal rate, regular rhythm and normal heart sounds.  Exam reveals no gallop and no friction rub.   No murmur heard. Pulmonary/Chest: Effort normal and breath sounds normal. No respiratory distress.  Abdominal: Soft. She exhibits no distension. There is no tenderness.  Musculoskeletal: She exhibits no edema or tenderness.  Lower extremities symmetric as compared to each other. No calf tenderness. Negative  Homan's. No palpable cords.   Neurological: She is alert.  Skin: Skin is warm and dry.  Psychiatric: She has a normal mood and affect. Her behavior is normal. Thought content normal.  Nursing note and vitals reviewed.    ED Treatments / Results  Labs (all labs ordered are listed, but only abnormal results are displayed) Labs Reviewed  CBC WITH DIFFERENTIAL/PLATELET - Abnormal; Notable for the following:       Result Value   WBC 17.3 (*)    RBC 5.83 (*)    Hemoglobin 19.3 (*)    HCT 54.0 (*)    Neutro Abs 13.6 (*)    All other components within normal limits  BASIC METABOLIC PANEL - Abnormal; Notable for the following:    Chloride 97 (*)    All other components within normal limits  URINALYSIS, ROUTINE W REFLEX MICROSCOPIC - Abnormal; Notable for the following:    Color, Urine STRAW (*)    Specific Gravity, Urine 1.001 (*)    Hgb urine dipstick SMALL (*)    Squamous Epithelial / LPF 0-5 (*)    All other components within normal limits  D-DIMER, QUANTITATIVE (NOT AT [facility])    EKG  EKG Interpretation None      EKG:  Rhythm: normal sinus Rate: 95 PR: 163 ms QRS: 94 ms QTc: 433 ms ST segments:normal   Radiology Dg Chest 2 View  Result Date: 05/16/2016 CLINICAL DATA:  Right chest pain and dyspnea for several weeks. EXAM: CHEST  2 VIEW COMPARISON:  05/03/2016 FINDINGS: The heart size and mediastinal contours are within normal limits. Biapical pleural-parenchymal scarring is stable. No evidence of pulmonary airspace disease or edema. No evidence of pneumothorax or pleural effusion. The visualized skeletal structures are unremarkable. IMPRESSION: Stable biapical scarring.  No active disease. Electronically Signed   By: Myles Rosenthal M.D.   On: 05/16/2016 17:26    Procedures Procedures (including critical care time)  Medications Ordered in ED Medications - No data to display   Initial Impression / Assessment and Plan / ED Course  I have reviewed the triage vital signs  and the nursing notes.  Pertinent labs & imaging results that were available during my care of the patient were reviewed by me and considered in my medical decision making (see chart for details).     51 year old female with dyspnea. She is having right-sided flank/chest pain but this is unchanged since her last ER visit. Seems atypical for ACS. Lungs sound clear on exam. No signs or symptoms of symptoms of DVT. She is not hypoxic. Her resting heart rate is in the 90s though. Will check a d-dimer. Plan CT if elevated. Seems very atypical for ACS. Doubt dissection or other emergent process. Previous workup was reviewed. No signs of ureteral stone. LFTs are fine. She is status post cholecystectomy. Symptoms seem atypical for PUD.  Workup unremarkable. Her d-dimer is normal. PE effectively ruled out. Doubt ACS.  Unclear etiology of her dyspnea or right sided abdominal/chest pain but have a low suspicion for emergent process.  Final Clinical Impressions(s) / ED Diagnoses   Final diagnoses:  Dyspnea, unspecified type  Right flank pain    New Prescriptions New Prescriptions   No medications on file     Raeford Razor, MD 05/27/16 1121

## 2016-05-16 NOTE — ED Triage Notes (Signed)
Pt complains of being short of breath that is worse today and she states that she still has flank pain for a few weeks

## 2016-10-10 ENCOUNTER — Encounter: Payer: Self-pay | Admitting: Emergency Medicine

## 2016-10-10 ENCOUNTER — Ambulatory Visit (INDEPENDENT_AMBULATORY_CARE_PROVIDER_SITE_OTHER): Payer: 59 | Admitting: Emergency Medicine

## 2016-10-10 ENCOUNTER — Ambulatory Visit (INDEPENDENT_AMBULATORY_CARE_PROVIDER_SITE_OTHER): Payer: 59

## 2016-10-10 VITALS — BP 124/82 | HR 83 | Temp 98.2°F | Resp 16 | Ht 61.25 in | Wt 96.0 lb

## 2016-10-10 DIAGNOSIS — F17200 Nicotine dependence, unspecified, uncomplicated: Secondary | ICD-10-CM | POA: Diagnosis not present

## 2016-10-10 DIAGNOSIS — R634 Abnormal weight loss: Secondary | ICD-10-CM

## 2016-10-10 DIAGNOSIS — M79604 Pain in right leg: Secondary | ICD-10-CM | POA: Insufficient documentation

## 2016-10-10 DIAGNOSIS — M79605 Pain in left leg: Secondary | ICD-10-CM

## 2016-10-10 DIAGNOSIS — R29898 Other symptoms and signs involving the musculoskeletal system: Secondary | ICD-10-CM | POA: Diagnosis not present

## 2016-10-10 IMAGING — DX DG CHEST 2V
2 series · 2 of 2 positions shown · non-contrast
Comparison: [DATE]

CLINICAL DATA: Smoker, weight loss

EXAM:
CHEST  2 VIEW

[chest pa]
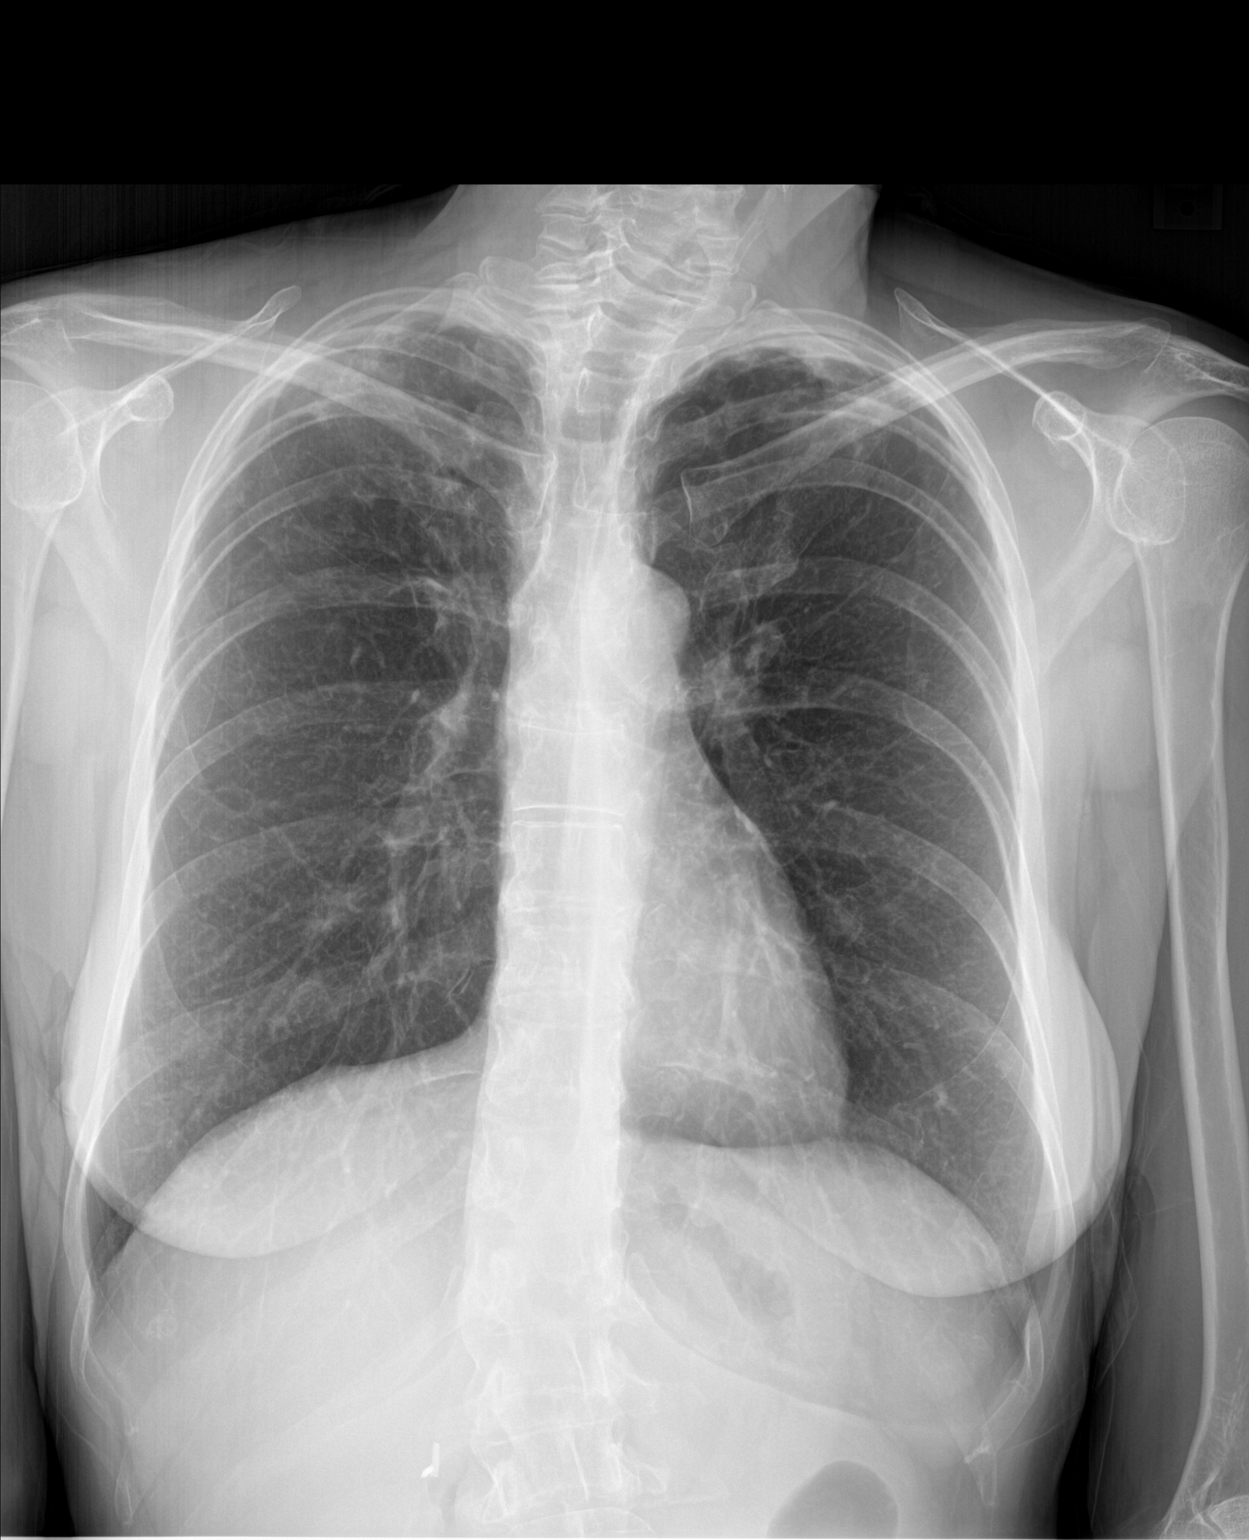

[chest lat]
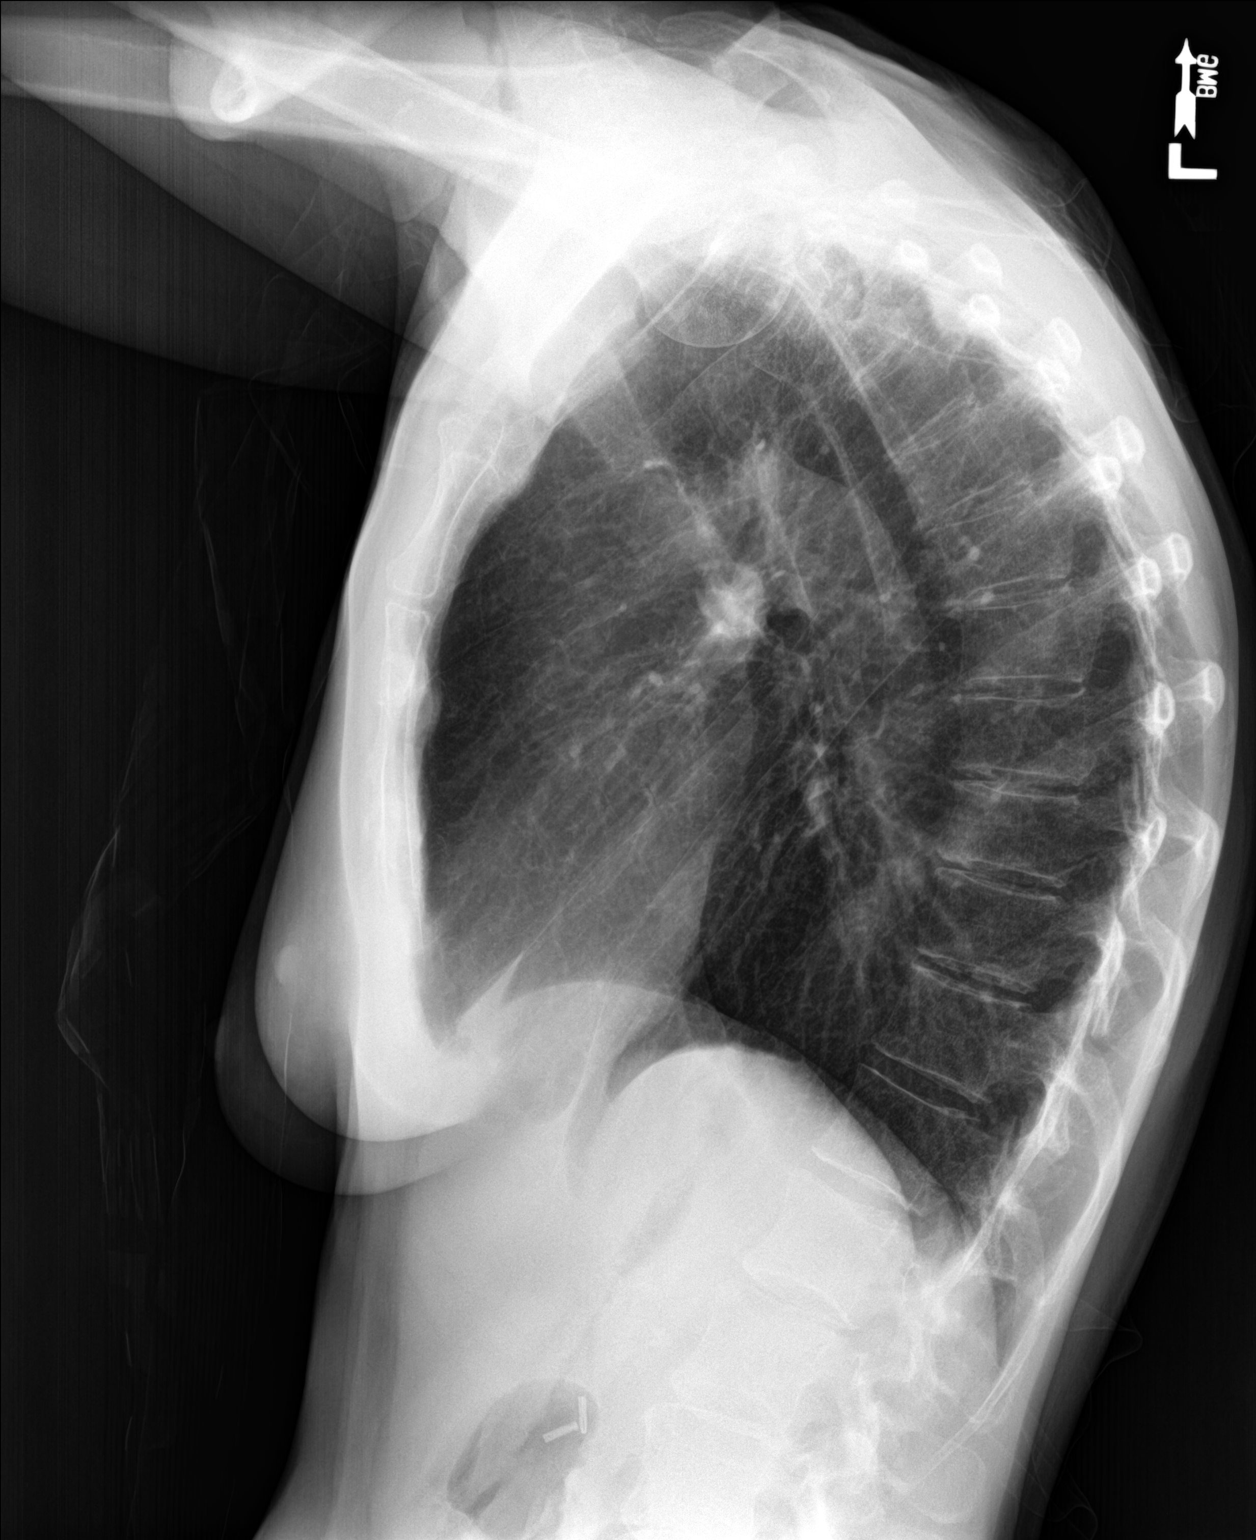

[2 of 2 positions shown; findings below may reference images not displayed]

FINDINGS: Biapical scarring. There is hyperinflation of the lungs compatible
with COPD. Heart is normal size. No effusions or acute bony
abnormality.
IMPRESSION: COPD.  Biapical scarring.  No active disease.

## 2016-10-10 NOTE — Progress Notes (Signed)
Gabriela Campbell 51 y.o.   Chief Complaint  Patient presents with  . Leg Pain    both x 2 month    HISTORY OF PRESENT ILLNESS: This is a 51 y.o. female complaining of bilateral leg pains and weakness for that started  2 months ago; denies trauma. Smoker; +wt loss, 30 lbs in 4 months.  HPI   Prior to Admission medications   Medication Sig Start Date End Date Taking? Authorizing Provider  cholecalciferol (VITAMIN D) 1000 units tablet Take 1,000 Units by mouth daily.    [provider]  cyclobenzaprine (FLEXERIL) 10 MG tablet Take 1 tablet (10 mg total) by mouth 2 (two) times daily as needed for muscle spasms. Patient not taking: Reported on 05/03/2016 04/17/16   McDonald, Mia A, PA-C  diphenhydrAMINE (BENADRYL) 25 MG tablet Take 1 tablet (25 mg total) by mouth 3 (three) times daily. Take one tablet three times daily for two days Patient not taking: Reported on 05/08/2016 05/03/16   Gerhard Munch, MD  famotidine (PEPCID) 20 MG tablet Take 1 tablet (20 mg total) by mouth 2 (two) times daily. Take one tablet twice daily for two days Patient not taking: Reported on 05/08/2016 05/03/16   Gerhard Munch, MD  HYDROcodone-acetaminophen (NORCO/VICODIN) 5-325 MG tablet Take 2 tablets by mouth every 4 (four) hours as needed. Patient not taking: Reported on 05/16/2016 05/08/16   Graciella Freer A, PA-C  ondansetron (ZOFRAN) 4 MG tablet Take 1 tablet (4 mg total) by mouth every 6 (six) hours. Patient not taking: Reported on 05/16/2016 05/08/16   Maxwell Caul, PA-C  predniSONE (DELTASONE) 20 MG tablet Take 2 tablets (40 mg total) by mouth daily with breakfast. For the next four days Patient not taking: Reported on 05/08/2016 05/03/16   Gerhard Munch, MD  vitamin B-12 (CYANOCOBALAMIN) 250 MCG tablet Take 250 mcg by mouth daily.    [provider]  vitamin C (ASCORBIC ACID) 500 MG tablet Take 500 mg by mouth daily.    [provider]    Allergies  Allergen Reactions  .  Ibuprofen Anaphylaxis  . Iodine Anaphylaxis  . Tramadol Anaphylaxis  . Codeine   . Dilaudid [Hydromorphone Hcl]   . Penicillins     Has patient had a PCN reaction causing immediate rash, facial/tongue/throat swelling, SOB or lightheadedness with hypotension:yes Has patient had a PCN reaction causing severe rash involving mucus membranes or skin necrosis: no Has patient had a PCN reaction that required hospitalization no Has patient had a PCN reaction occurring within the last 10 years: no If all of the above answers are "NO", then may proceed with Cephalosporin use.     There are no active problems to display for this patient.   Past Medical History:  Diagnosis Date  . Shingles     Past Surgical History:  Procedure Laterality Date  . ABDOMINAL HYSTERECTOMY    . CHOLECYSTECTOMY      Social History   Social History  . Marital status: Married    Spouse name: N/A  . Number of children: N/A  . Years of education: N/A   Occupational History  . Not on file.   Social History Main Topics  . Smoking status: Current Every Day Smoker    Packs/day: 1.00    Types: Cigarettes  . Smokeless tobacco: Never Used  . Alcohol use No  . Drug use: No  . Sexual activity: No   Other Topics Concern  . Not on file   Social History Narrative  .  No narrative on file    No family history on file.   Review of Systems  Constitutional: Positive for weight loss. Negative for chills and fever.  HENT: Negative.   Eyes: Negative.   Respiratory: Negative.  Negative for cough, hemoptysis and shortness of breath.   Cardiovascular: Negative.  Negative for chest pain, palpitations, claudication and leg swelling.  Gastrointestinal: Negative.  Negative for abdominal pain, blood in stool, diarrhea, nausea and vomiting.  Genitourinary: Negative for dysuria and hematuria.  Musculoskeletal: Negative for back pain, joint pain, myalgias and neck pain.       Leg pains.  Skin: Negative for rash.   Neurological: Positive for focal weakness (legs). Negative for dizziness, tingling, sensory change, loss of consciousness and headaches.  All other systems reviewed and are negative.  Vitals:   10/10/16 1155  BP: 124/82  Pulse: 83  Resp: 16  Temp: 98.2 F (36.8 C)  SpO2: 99%     Physical Exam  Constitutional: She is oriented to person, place, and time. She appears well-developed and well-nourished.  HENT:  Head: Normocephalic and atraumatic.  Right Ear: External ear normal.  Left Ear: External ear normal.  Nose: Nose normal.  Mouth/Throat: Oropharynx is clear and moist.  Eyes: Pupils are equal, round, and reactive to light. Conjunctivae and EOM are normal.  Neck: Normal range of motion. Neck supple. No JVD present. No thyromegaly present.  Cardiovascular: Normal rate, regular rhythm, normal heart sounds and intact distal pulses.   Pulmonary/Chest: Effort normal and breath sounds normal.  Abdominal: Soft. Bowel sounds are normal. She exhibits no distension. There is no tenderness.  Musculoskeletal: Normal range of motion. She exhibits no edema or tenderness.  Lymphadenopathy:    She has no cervical adenopathy.  Neurological: She is alert and oriented to person, place, and time. She displays abnormal reflex. No cranial nerve deficit.  Reflex Scores:      Bicep reflexes are 3+ on the right side and 3+ on the left side.      Brachioradialis reflexes are 3+ on the right side and 3+ on the left side.      Patellar reflexes are 4+ on the right side and 4+ on the left side.      Achilles reflexes are 4+ on the right side and 4+ on the left side. +weakness proximal leg muscles  Skin: Skin is warm and dry. Capillary refill takes less than 2 seconds. No rash noted.  Psychiatric: She has a normal mood and affect. Her behavior is normal.  Vitals reviewed.  Dg Chest 2 View  Result Date: 10/10/2016 CLINICAL DATA:  Smoker, weight loss EXAM: CHEST  2 VIEW COMPARISON:  05/16/2016  FINDINGS: Biapical scarring. There is hyperinflation of the lungs compatible with COPD. Heart is normal size. No effusions or acute bony abnormality. IMPRESSION: COPD.  Biapical scarring.  No active disease. Electronically Signed   By: Charlett Nose M.D.   On: 10/10/2016 12:56     ASSESSMENT & PLAN: Langley was seen today for leg pain.  Diagnoses and all orders for this visit:  Bilateral leg pain Comments: suspected neuro-muscular disease Orders: -     CBC with Differential/Platelet -     Comprehensive metabolic panel -     CK -     Sedimentation rate -     Hepatitis C antibody -     HIV antibody -     TSH  Smoker -     CBC with Differential/Platelet -  Comprehensive metabolic panel -     CK -     Sedimentation rate -     Hepatitis C antibody -     HIV antibody -     TSH -     DG Chest 2 View; Future  Loss of weight Comments: suspected malignancy Orders: -     CBC with Differential/Platelet -     Comprehensive metabolic panel -     CK -     Sedimentation rate -     Hepatitis C antibody -     HIV antibody -     TSH -     DG Chest 2 View; Future  Leg weakness, bilateral Comments: r/o eaton-lambert syndrome Orders: -     Ambulatory referral to Neurology   Follow up here after Neurology evaluation. Labs to be reviewed in the next 48 hours.    Edwina Barth, MD [facility]

## 2016-10-10 NOTE — Patient Instructions (Addendum)
We recommend that you schedule a mammogram for breast cancer screening. Typically, you do not need a referral to do this. Please contact a local imaging center to schedule your mammogram.  [facility] - [phone]  *ask for the Radiology Department [facility]) - [phone] or [phone]  [facility] - [phone] [facility] - [phone] [facility] - [phone]  *ask for the Radiology Department [facility] - [phone]  *ask for the Radiology Department [facility] - [phone]  *ask for the Mammography Department [facility] - [phone]     IF you received an x-ray today, you will receive an invoice from [facility]. Please contact [facility] at [phone] with questions or concerns regarding your invoice.   IF you received labwork today, you will receive an invoice from [facility]. Please contact [facility] at [phone] with questions or concerns regarding your invoice.   Our billing staff will not be able to assist you with questions regarding bills from these companies.  You will be contacted with the lab results as soon as they are available. The fastest way to get your results is to activate your My Chart account. Instructions are located on the last page of this paperwork. If you have not heard from us regarding the results in 2 weeks, please contact this office.      

## 2016-10-11 LAB — COMPREHENSIVE METABOLIC PANEL
AST: 10 IU/L (ref 0–40)
Albumin/Globulin Ratio: 1.5 (ref 1.2–2.2)
Albumin: 4.3 g/dL (ref 3.5–5.5)
Alkaline Phosphatase: 136 IU/L — ABNORMAL HIGH (ref 39–117)
BUN/Creatinine Ratio: 6 — ABNORMAL LOW (ref 9–23)
BUN: 5 mg/dL — ABNORMAL LOW (ref 6–24)
Bilirubin Total: 0.2 mg/dL (ref 0.0–1.2)
CO2: 29 mmol/L (ref 20–29)
CREATININE: 0.78 mg/dL (ref 0.57–1.00)
Calcium: 9.7 mg/dL (ref 8.7–10.2)
Chloride: 97 mmol/L (ref 96–106)
GFR calc Af Amer: 101 mL/min/{1.73_m2} (ref >59)
GFR, EST NON AFRICAN AMERICAN: 88 mL/min/{1.73_m2} (ref >59)
Globulin, Total: 2.8 g/dL (ref 1.5–4.5)
Glucose: 84 mg/dL (ref 65–99)
Potassium: 3.9 mmol/L (ref 3.5–5.2)
Sodium: 142 mmol/L (ref 134–144)
Total Protein: 7.1 g/dL (ref 6.0–8.5)

## 2016-10-11 LAB — CBC WITH DIFFERENTIAL/PLATELET
Basophils Absolute: 0 10*3/uL (ref 0.0–0.2)
Basos: 0 %
EOS (ABSOLUTE): 0 10*3/uL (ref 0.0–0.4)
EOS: 0 %
HEMATOCRIT: 48.7 % — AB (ref 34.0–46.6)
Hemoglobin: 16.5 g/dL — ABNORMAL HIGH (ref 11.1–15.9)
Immature Grans (Abs): 0 10*3/uL (ref 0.0–0.1)
Immature Granulocytes: 0 %
LYMPHS: 19 %
Lymphocytes Absolute: 2.3 10*3/uL (ref 0.7–3.1)
MCH: 31.9 pg (ref 26.6–33.0)
MCHC: 33.9 g/dL (ref 31.5–35.7)
MCV: 94 fL (ref 79–97)
MONOS ABS: 0.6 10*3/uL (ref 0.1–0.9)
Monocytes: 5 %
NEUTROS PCT: 76 %
Neutrophils Absolute: 9.3 10*3/uL — ABNORMAL HIGH (ref 1.4–7.0)
PLATELETS: 242 10*3/uL (ref 150–379)
RBC: 5.18 x10E6/uL (ref 3.77–5.28)
RDW: 13.3 % (ref 12.3–15.4)
WBC: 12.3 10*3/uL — AB (ref 3.4–10.8)

## 2016-10-11 LAB — TSH: TSH: 1.12 u[IU]/mL (ref 0.450–4.500)

## 2016-10-11 LAB — SEDIMENTATION RATE: SED RATE: 2 mm/h (ref 0–40)

## 2016-10-11 LAB — HIV ANTIBODY (ROUTINE TESTING W REFLEX): HIV SCREEN 4TH GENERATION: NONREACTIVE

## 2016-10-11 LAB — CK: Total CK: 29 U/L (ref 24–173)

## 2016-10-11 LAB — HEPATITIS C ANTIBODY

## 2016-10-18 ENCOUNTER — Encounter: Payer: Self-pay | Admitting: Radiology

## 2017-02-13 ENCOUNTER — Emergency Department (HOSPITAL_BASED_OUTPATIENT_CLINIC_OR_DEPARTMENT_OTHER): Payer: 59

## 2017-02-13 ENCOUNTER — Other Ambulatory Visit: Payer: Self-pay

## 2017-02-13 ENCOUNTER — Encounter (HOSPITAL_BASED_OUTPATIENT_CLINIC_OR_DEPARTMENT_OTHER): Payer: Self-pay

## 2017-02-13 ENCOUNTER — Emergency Department (HOSPITAL_BASED_OUTPATIENT_CLINIC_OR_DEPARTMENT_OTHER)
Admission: EM | Admit: 2017-02-13 | Discharge: 2017-02-13 | Disposition: A | Discharge status: Home / Self Care | Payer: 59 | Attending: Emergency Medicine | Admitting: Emergency Medicine

## 2017-02-13 DIAGNOSIS — R109 Unspecified abdominal pain: Secondary | ICD-10-CM | POA: Diagnosis not present

## 2017-02-13 DIAGNOSIS — R531 Weakness: Secondary | ICD-10-CM | POA: Diagnosis not present

## 2017-02-13 DIAGNOSIS — F17210 Nicotine dependence, cigarettes, uncomplicated: Secondary | ICD-10-CM | POA: Insufficient documentation

## 2017-02-13 DIAGNOSIS — R200 Anesthesia of skin: Secondary | ICD-10-CM

## 2017-02-13 DIAGNOSIS — Z79899 Other long term (current) drug therapy: Secondary | ICD-10-CM | POA: Diagnosis not present

## 2017-02-13 DIAGNOSIS — N12 Tubulo-interstitial nephritis, not specified as acute or chronic: Secondary | ICD-10-CM | POA: Diagnosis not present

## 2017-02-13 LAB — CBC WITH DIFFERENTIAL/PLATELET
BASOS ABS: 0 10*3/uL (ref 0.0–0.1)
Basophils Relative: 0 %
Eosinophils Absolute: 0 10*3/uL (ref 0.0–0.7)
Eosinophils Relative: 0 %
HEMATOCRIT: 46.1 % — AB (ref 36.0–46.0)
HEMOGLOBIN: 16.2 g/dL — AB (ref 12.0–15.0)
LYMPHS PCT: 21 %
Lymphs Abs: 2.3 10*3/uL (ref 0.7–4.0)
MCH: 32.6 pg (ref 26.0–34.0)
MCHC: 35.1 g/dL (ref 30.0–36.0)
MCV: 92.8 fL (ref 78.0–100.0)
MONO ABS: 0.6 10*3/uL (ref 0.1–1.0)
Monocytes Relative: 5 %
NEUTROS ABS: 8.1 10*3/uL — AB (ref 1.7–7.7)
NEUTROS PCT: 74 %
Platelets: 195 10*3/uL (ref 150–400)
RBC: 4.97 MIL/uL (ref 3.87–5.11)
RDW: 13.2 % (ref 11.5–15.5)
WBC: 11.1 10*3/uL — ABNORMAL HIGH (ref 4.0–10.5)

## 2017-02-13 LAB — COMPREHENSIVE METABOLIC PANEL
ALK PHOS: 156 U/L — AB (ref 38–126)
ALT: 51 U/L (ref 14–54)
AST: 25 U/L (ref 15–41)
Albumin: 4.1 g/dL (ref 3.5–5.0)
Anion gap: 8 (ref 5–15)
BILIRUBIN TOTAL: 0.4 mg/dL (ref 0.3–1.2)
BUN: 18 mg/dL (ref 6–20)
CO2: 30 mmol/L (ref 22–32)
CREATININE: 0.89 mg/dL (ref 0.44–1.00)
Calcium: 9.3 mg/dL (ref 8.9–10.3)
Chloride: 101 mmol/L (ref 101–111)
GFR calc Af Amer: >60 mL/min (ref >60)
Glucose, Bld: 85 mg/dL (ref 65–99)
Potassium: 3.5 mmol/L (ref 3.5–5.1)
Sodium: 139 mmol/L (ref 135–145)
TOTAL PROTEIN: 7.5 g/dL (ref 6.5–8.1)

## 2017-02-13 LAB — URINALYSIS, ROUTINE W REFLEX MICROSCOPIC
BILIRUBIN URINE: NEGATIVE
GLUCOSE, UA: NEGATIVE mg/dL
KETONES UR: NEGATIVE mg/dL
Nitrite: POSITIVE — AB
PH: 6 (ref 5.0–8.0)
Protein, ur: NEGATIVE mg/dL
SPECIFIC GRAVITY, URINE: 1.01 (ref 1.005–1.030)

## 2017-02-13 LAB — URINALYSIS, MICROSCOPIC (REFLEX)

## 2017-02-13 LAB — LIPASE, BLOOD: Lipase: 34 U/L (ref 11–51)

## 2017-02-13 LAB — CK: Total CK: 25 U/L — ABNORMAL LOW (ref 38–234)

## 2017-02-13 LAB — TROPONIN I

## 2017-02-13 IMAGING — DX DG CHEST 2V
2 series · 2 of 2 positions shown · non-contrast
Comparison: PA and lateral chest [DATE] and [DATE].

CLINICAL DATA: Shortness of breath for 1 week in a smoker.

EXAM:
CHEST  2 VIEW

[chest pa]
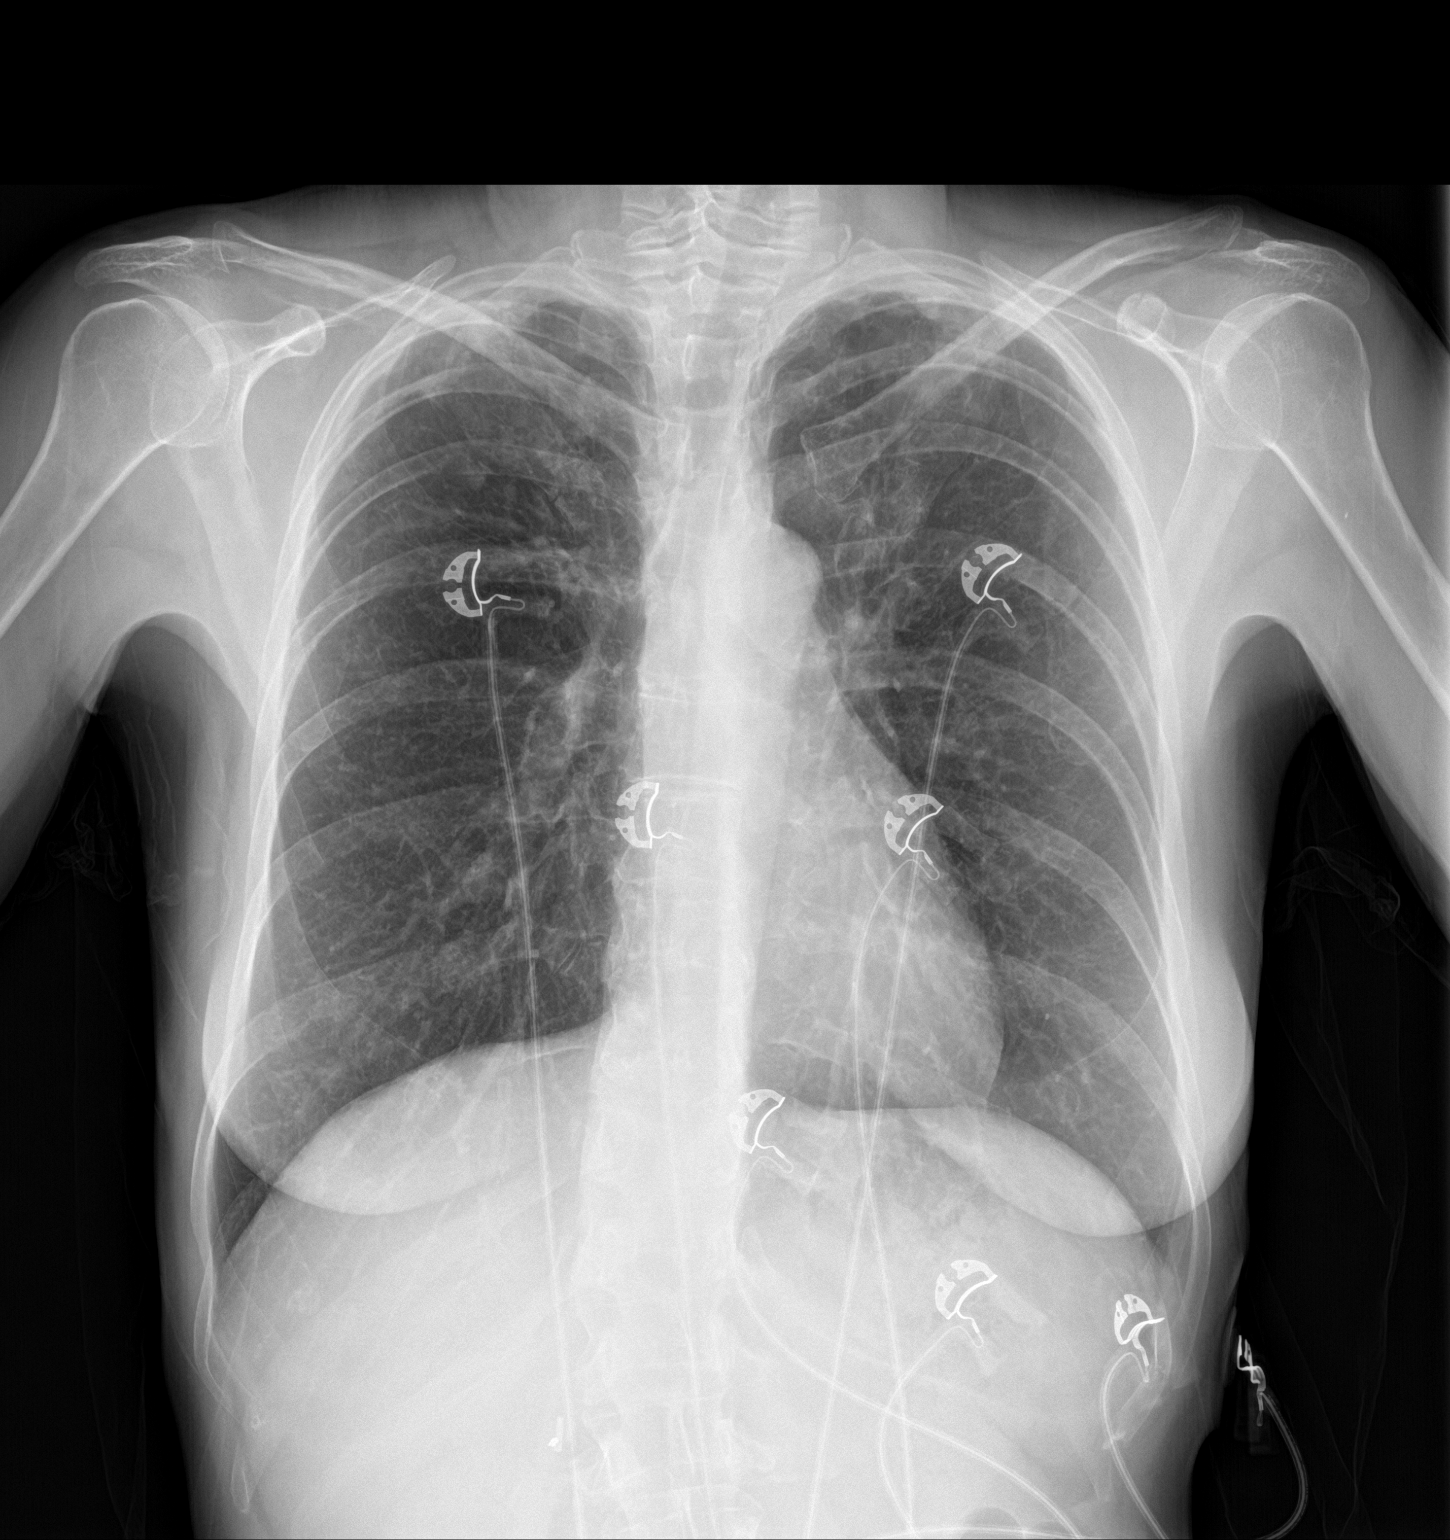

[chest lat]
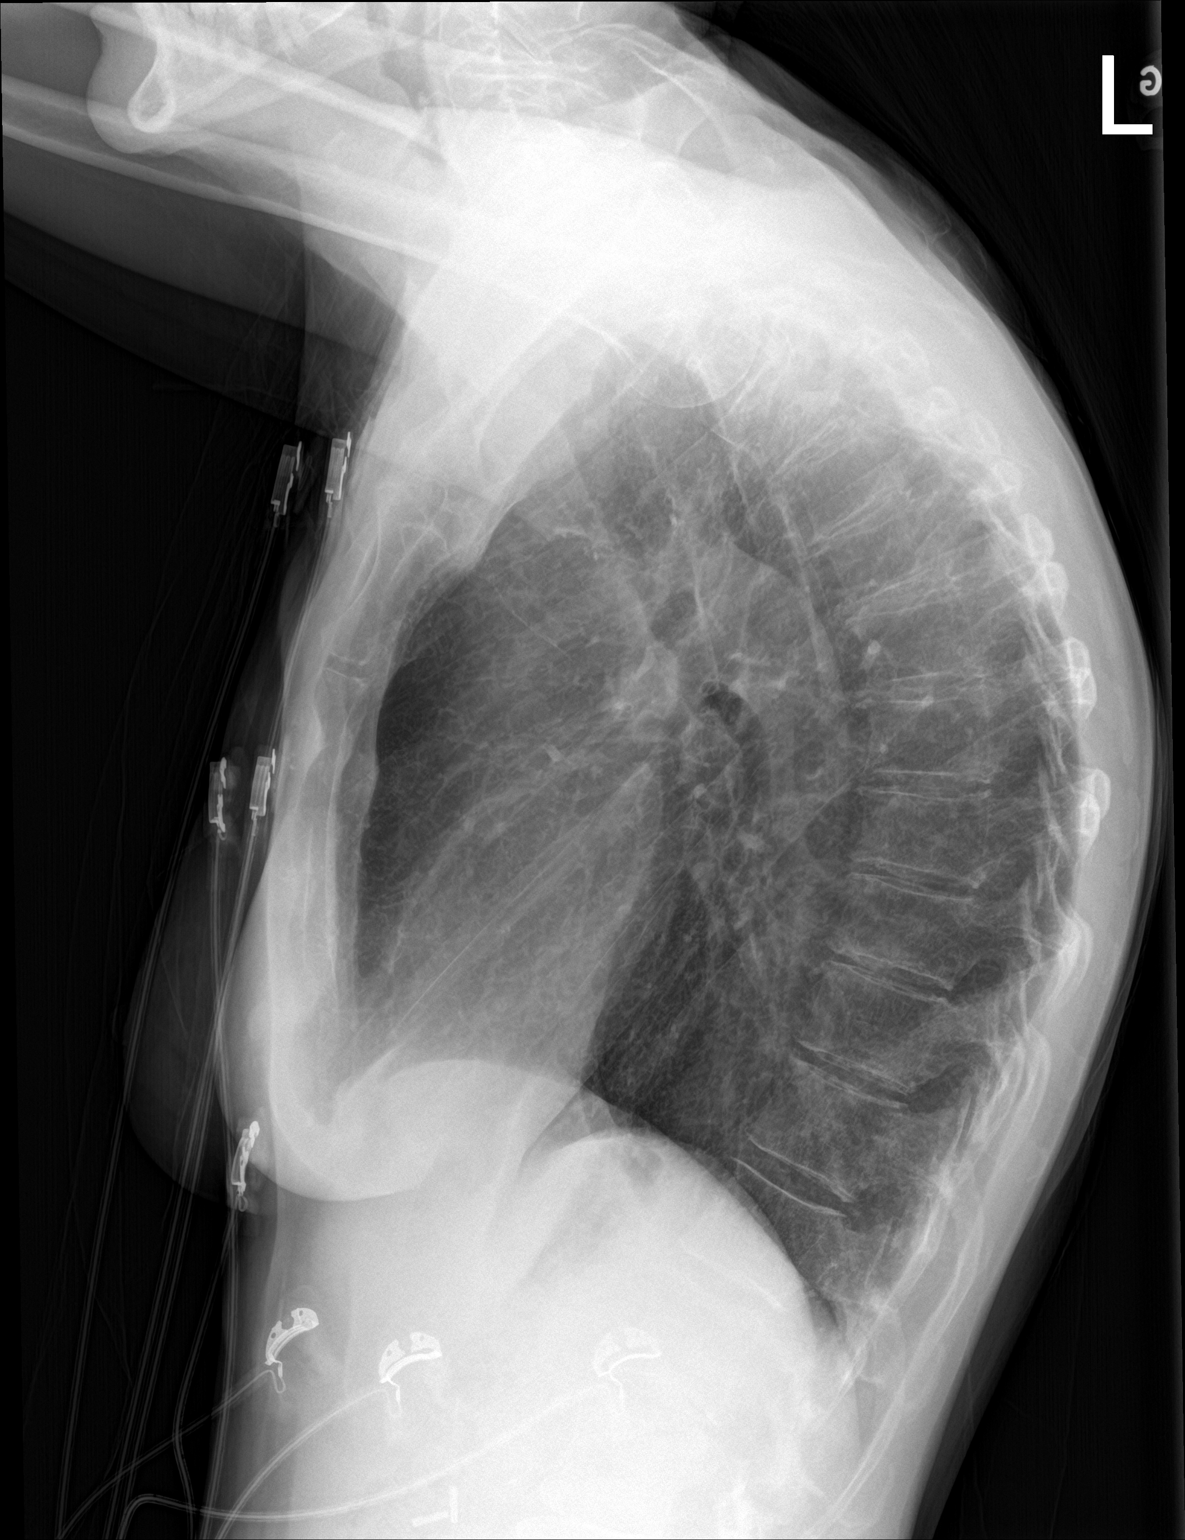

[2 of 2 positions shown; findings below may reference images not displayed]

FINDINGS: The lungs are emphysematous with biapical scar. No consolidative
process, nodule, mass, effusion or pneumothorax. Heart size is
normal. No acute bony abnormality.
IMPRESSION: COPD without acute disease.

## 2017-02-13 IMAGING — CT CT RENAL STONE PROTOCOL
2 of 4 series · 16 of 46 positions shown, 18 images · non-contrast
Comparison: CT abdomen pelvis dated [DATE].

CLINICAL DATA: Right flank pain since yesterday. Remote history of
prior kidney stone.

EXAM:
CT ABDOMEN AND PELVIS WITHOUT CONTRAST
TECHNIQUE: Multidetector CT imaging of the abdomen and pelvis was performed
following the standard protocol without IV contrast.

[Series 2: axial st · axial · 0.61mm/px · z∈[-396,-11]mm · 13 of 85 slices shown, 15 images]
[im 4/85  soft-tissue]
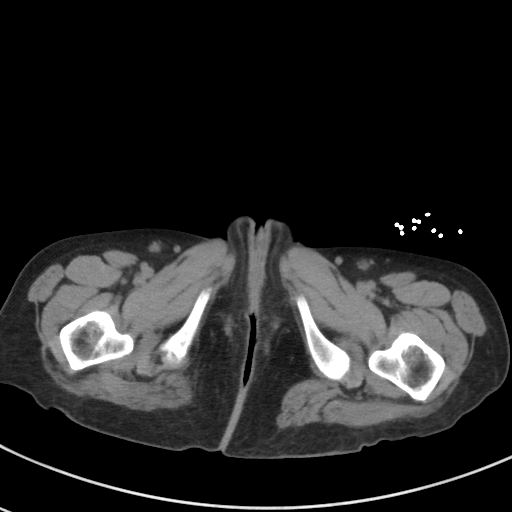
[im 4/85  bone]
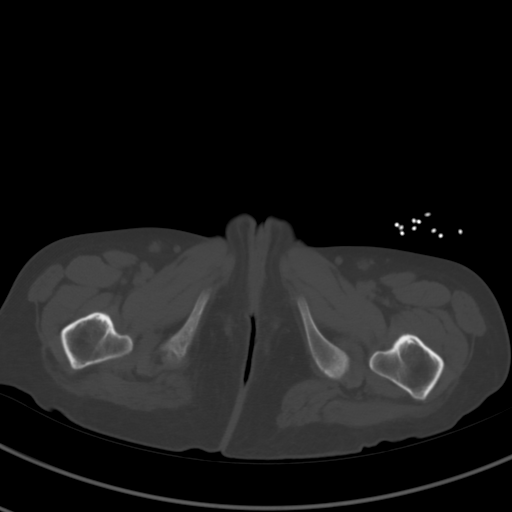
[im 11/85  soft-tissue]
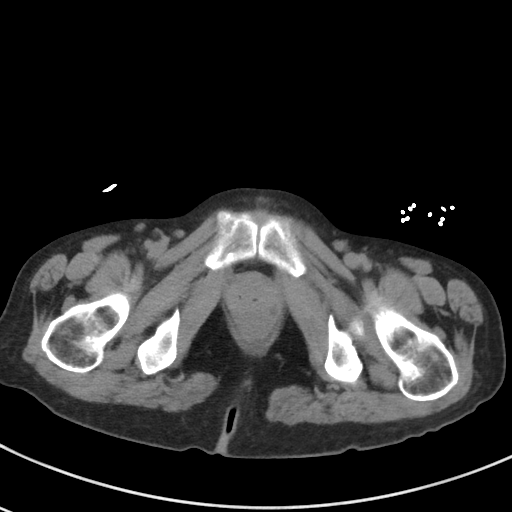
[im 19/85  soft-tissue]
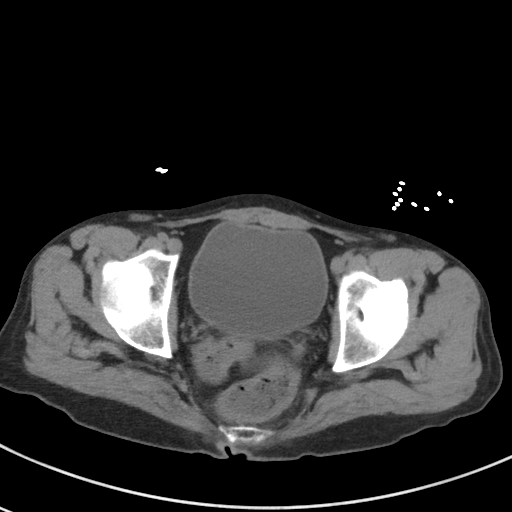
[im 22/85  soft-tissue]
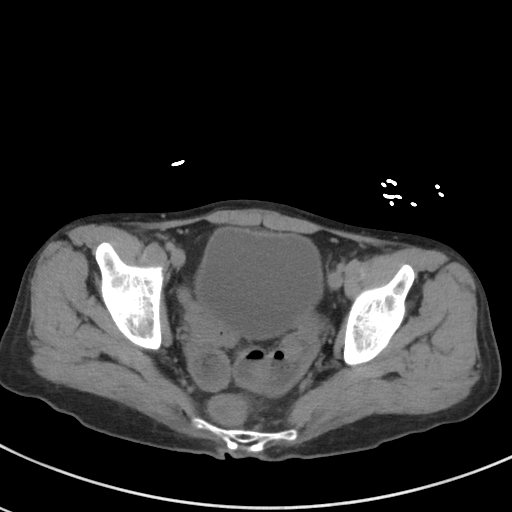
[im 30/85  soft-tissue]
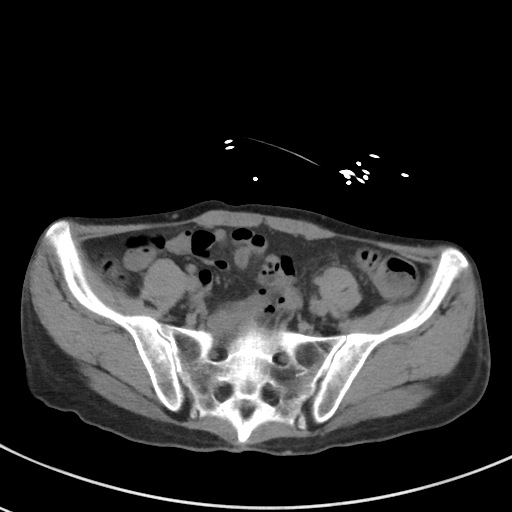
[im 37/85  soft-tissue]
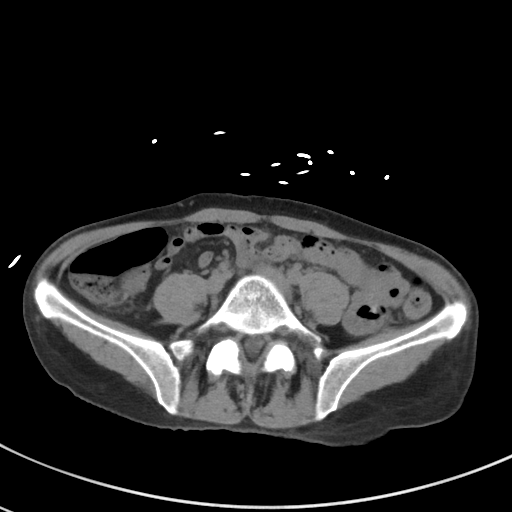
[im 44/85  soft-tissue]
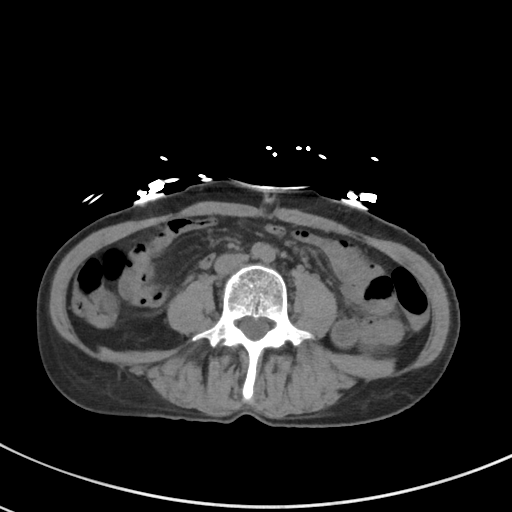
[im 48/85  soft-tissue]
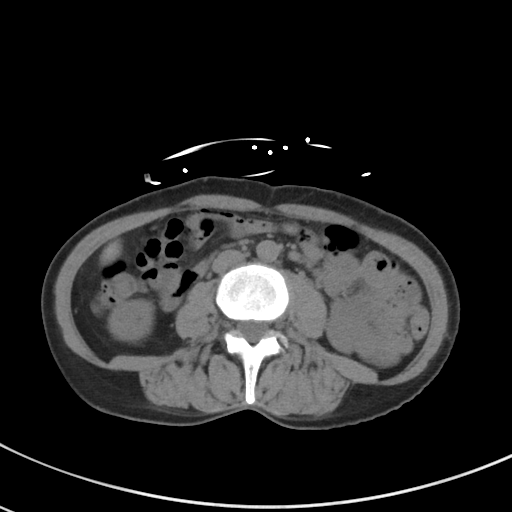
[im 55/85  soft-tissue]
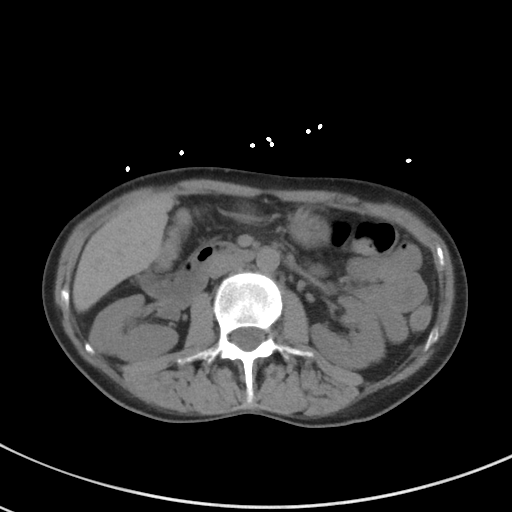
[im 55/85  bone]
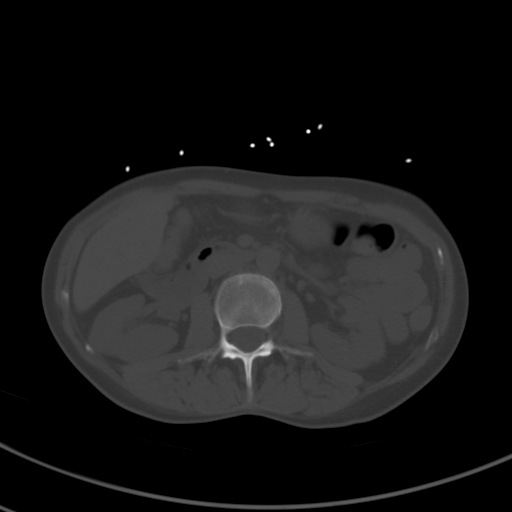
[im 63/85  soft-tissue]
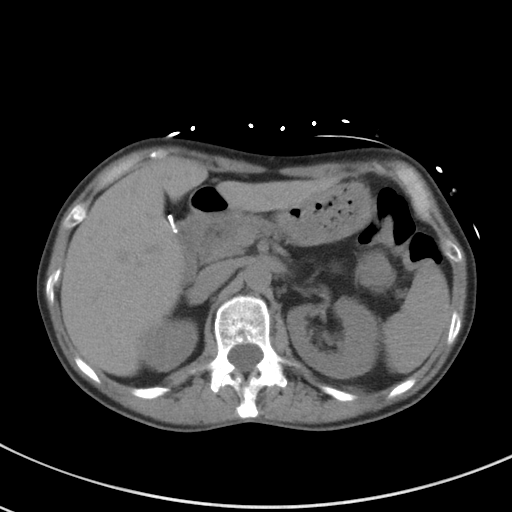
[im 66/85  soft-tissue]
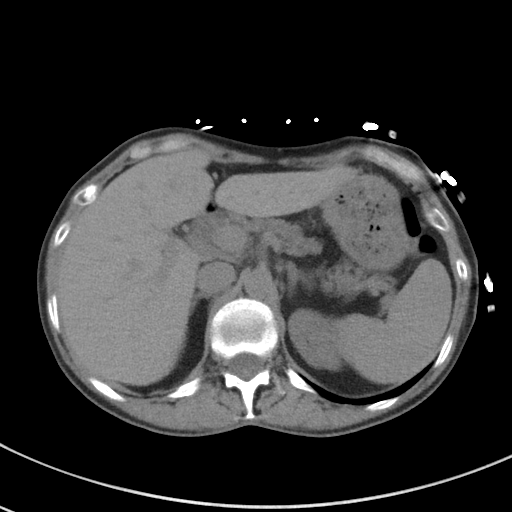
[im 74/85  soft-tissue]
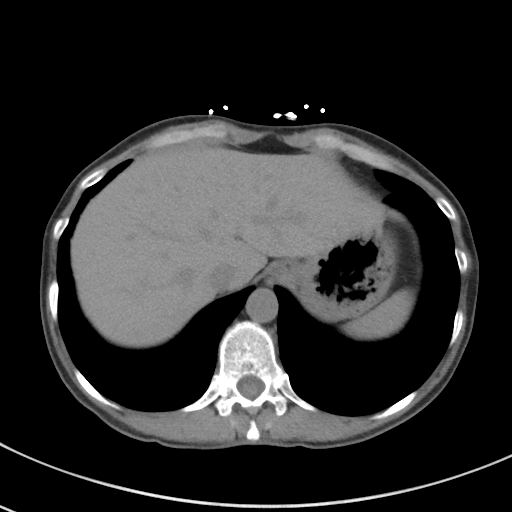
[im 81/85  soft-tissue]
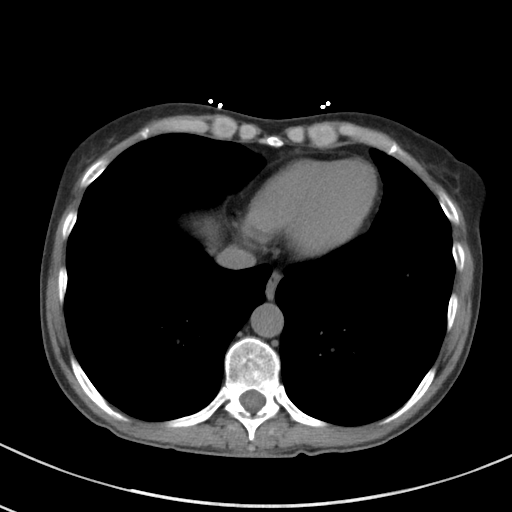

[Series 4: coronal st · coronal · 0.58mm/px · 3 of 68 slices shown]
[im 23/68  soft-tissue]
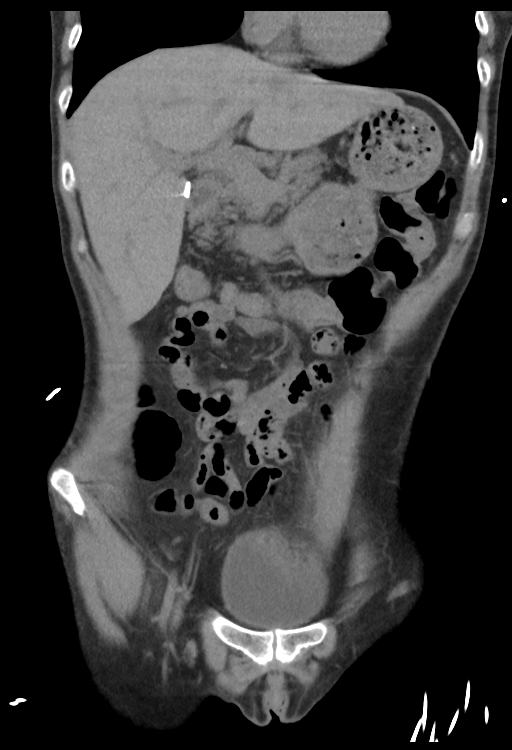
[im 30/68  soft-tissue]
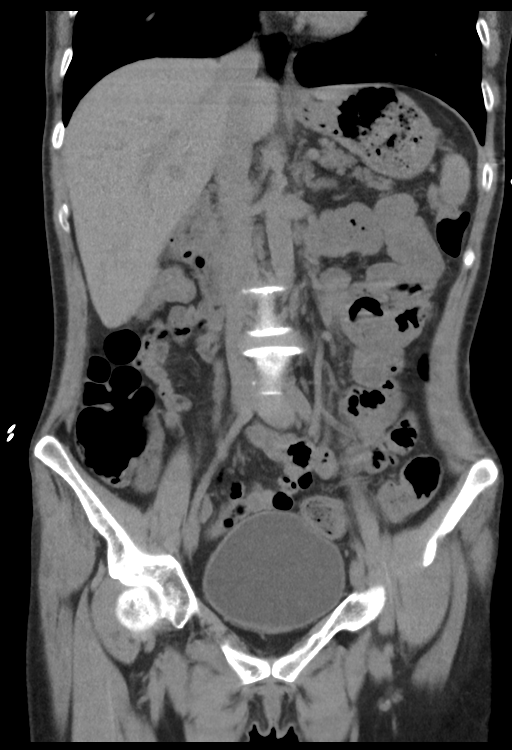
[im 38/68  soft-tissue]
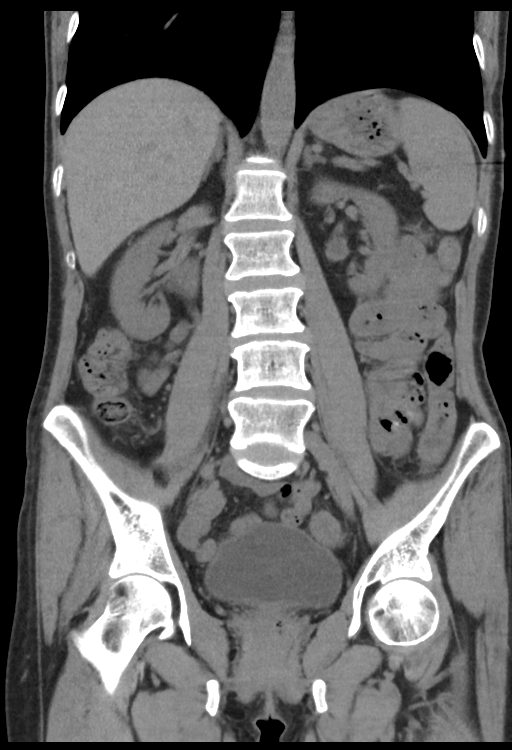

[16 of 46 positions shown; findings below may reference images not displayed]

FINDINGS: Lower chest: No acute abnormality.

Hepatobiliary: Unchanged subcentimeter low-density lesion near the
liver dome, still too small to characterize but statistically likely
a cyst. Prior cholecystectomy. No biliary dilatation.

Pancreas: Unremarkable. No pancreatic ductal dilatation or
surrounding inflammatory changes.

Spleen: Normal in size without focal abnormality.

Adrenals/Urinary Tract: Adrenal glands are unremarkable. Kidneys are
normal, without renal calculi, focal lesion, or hydronephrosis.
Bladder is unremarkable.

Stomach/Bowel: Stomach is within normal limits. Appendix appears
normal. No evidence of bowel wall thickening, distention, or
inflammatory changes.

Vascular/Lymphatic: No significant vascular findings are present. No
enlarged abdominal or pelvic lymph nodes.

Reproductive: Status post hysterectomy. No adnexal masses.

Other: No ascites or pneumoperitoneum.

Musculoskeletal: No acute or significant osseous findings.
IMPRESSION: 1.  No acute intra-abdominal process.

## 2017-02-13 IMAGING — CT CT CERVICAL SPINE W/O CM
3 of 7 series · 12 of 33 positions shown, 14 images · non-contrast
Comparison: None.

CLINICAL DATA: Numbness tingling bilateral arms and legs.
Radiculopathy

EXAM:
CT HEAD WITHOUT CONTRAST
CT CERVICAL SPINE WITHOUT CONTRAST
TECHNIQUE: Multidetector CT imaging of the head and cervical spine was
performed following the standard protocol without intravenous
contrast. Multiplanar CT image reconstructions of the cervical spine
were also generated.

[Series 4: head 3.0 mpr cor · coronal · 0.29mm/px · 2 of 65 slices shown]
[im 22/65  bone]
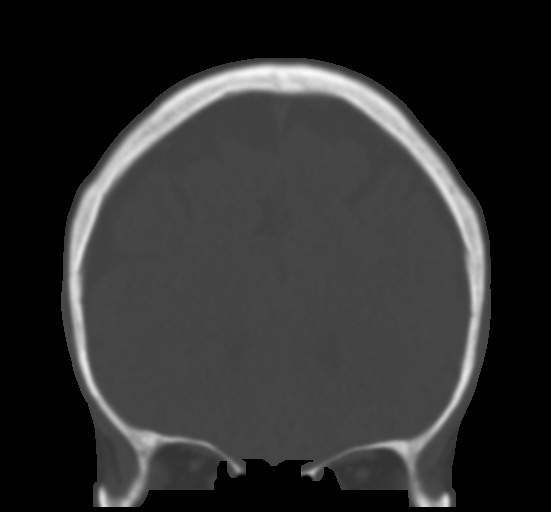
[im 43/65  bone]
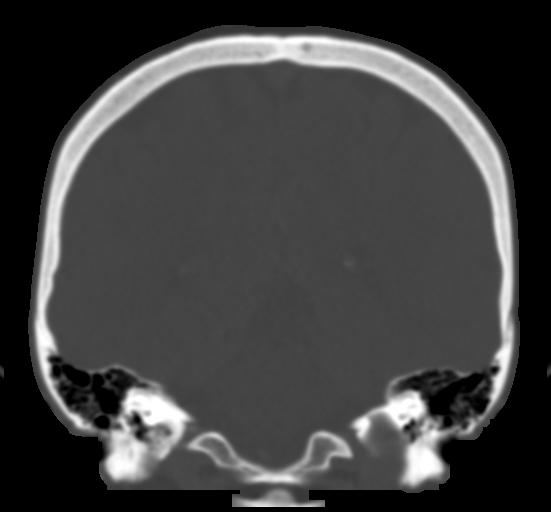

[Series 10: sagittals · sagittal · 0.22mm/px · 5 of 61 slices shown]
[im 11/61  bone]
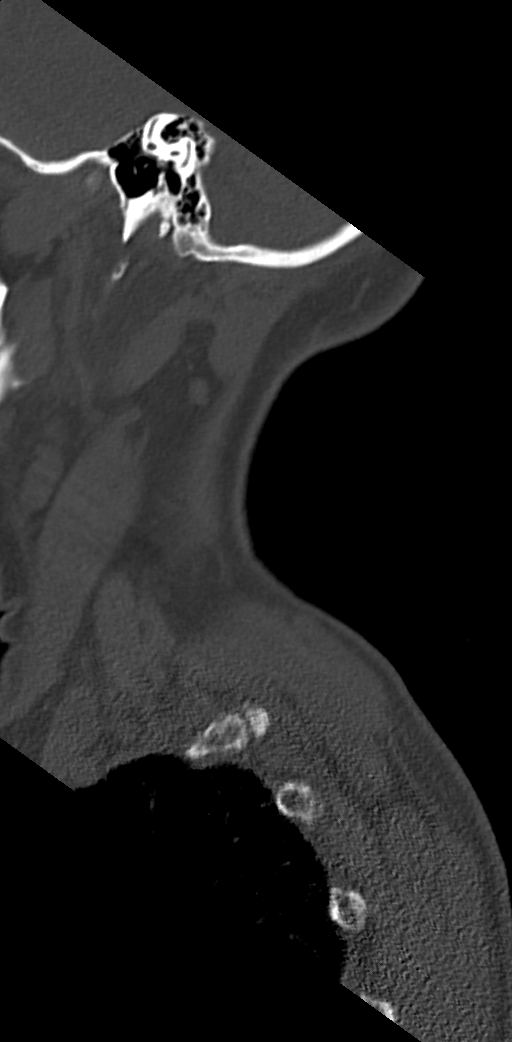
[im 21/61  bone]
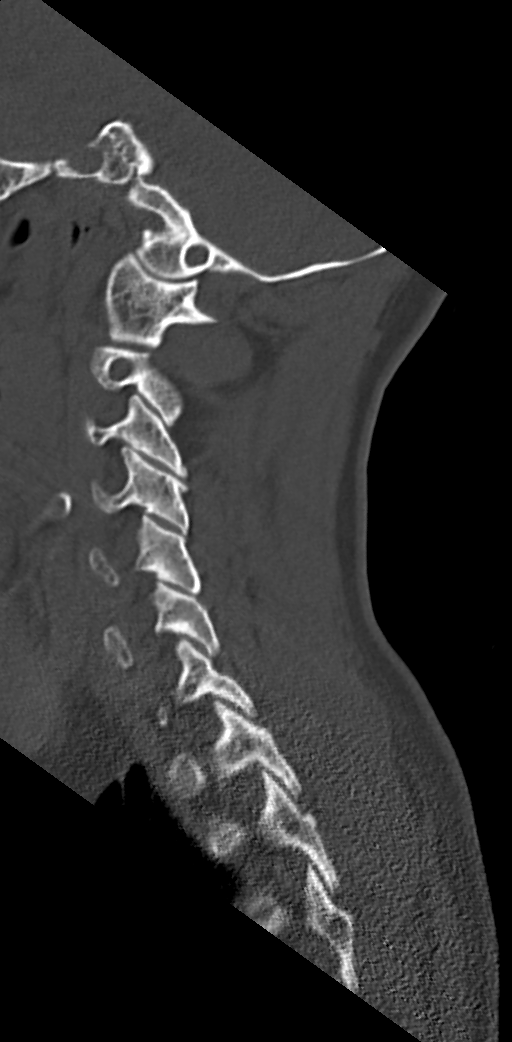
[im 31/61  bone]
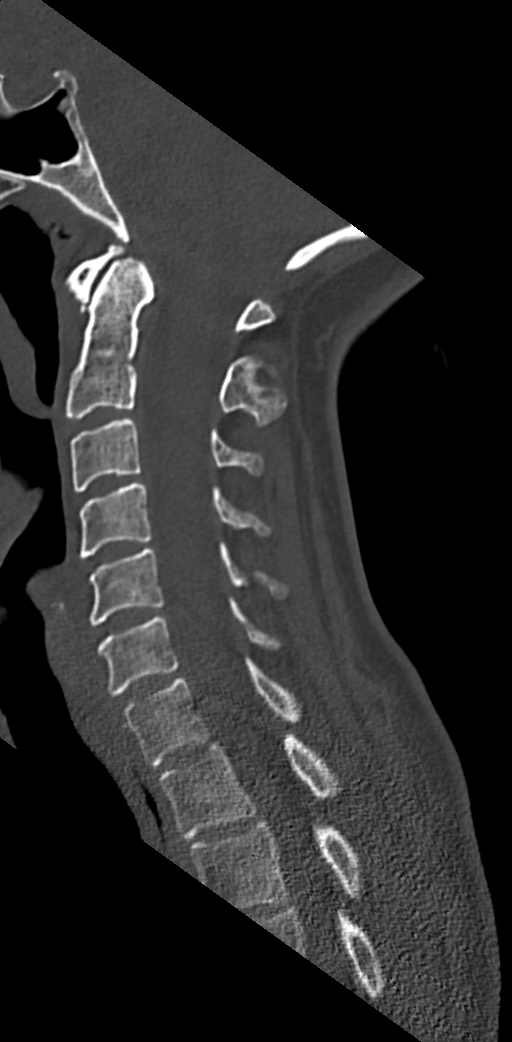
[im 41/61  bone]
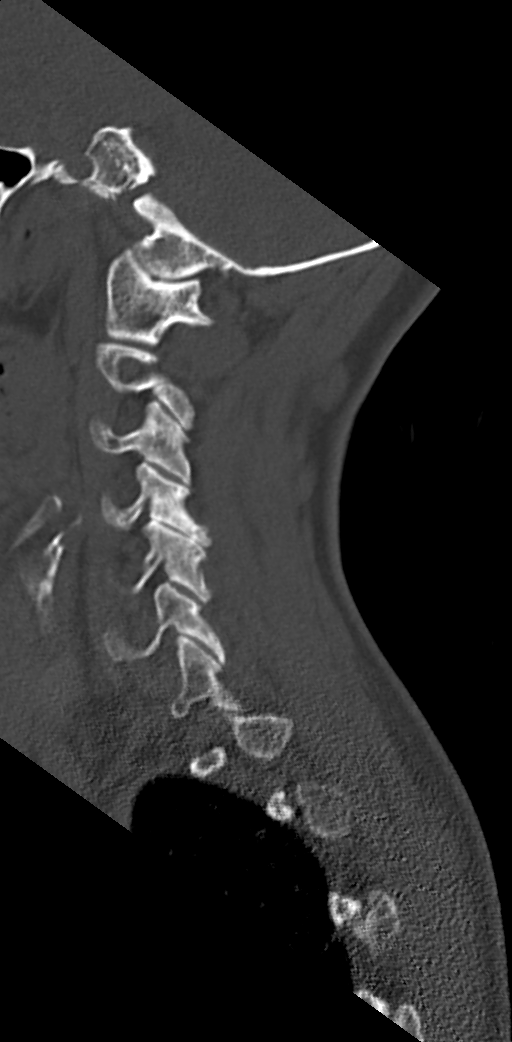
[im 51/61  bone]
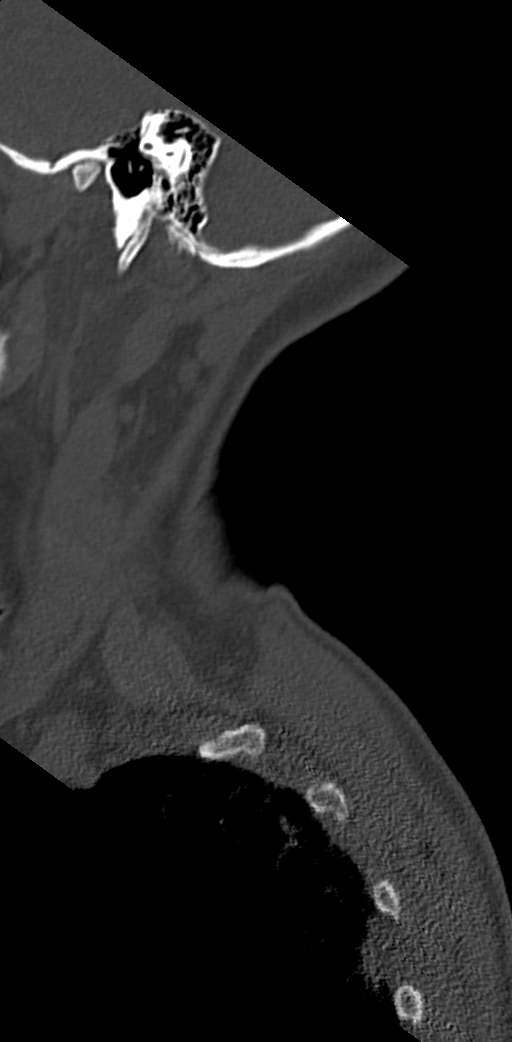

[Series 11: orthogonals · axial · 0.24mm/px · z∈[-278,-175]mm · 5 of 93 slices shown, 7 images]
[im 16/93  soft-tissue]
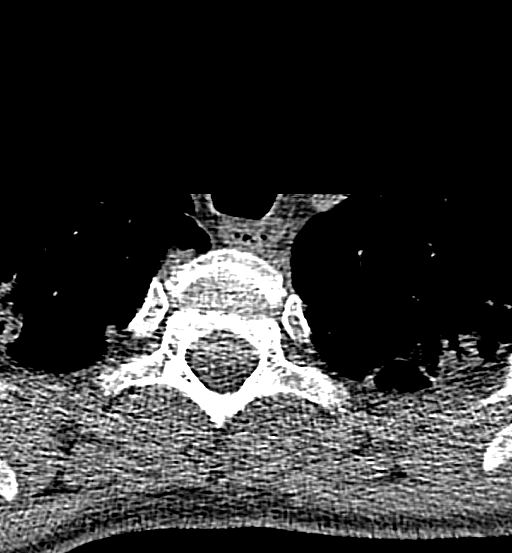
[im 16/93  bone]
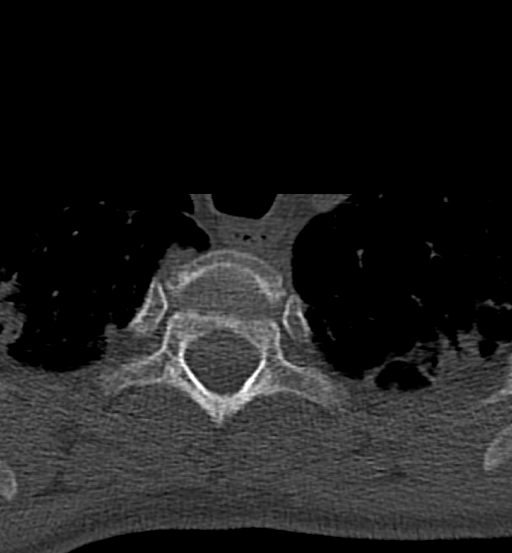
[im 31/93  bone]
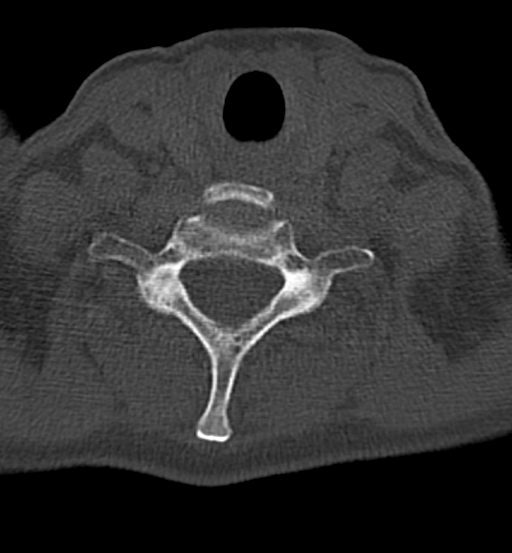
[im 47/93  bone]
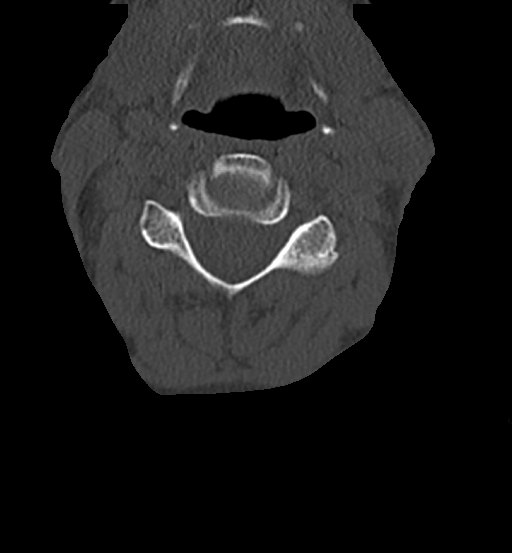
[im 62/93  bone]
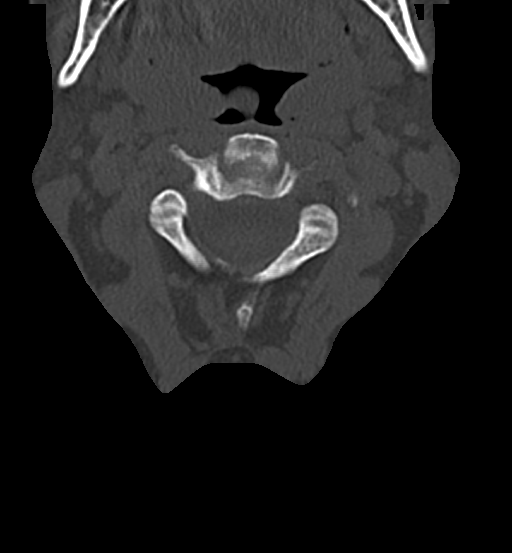
[im 77/93  soft-tissue]
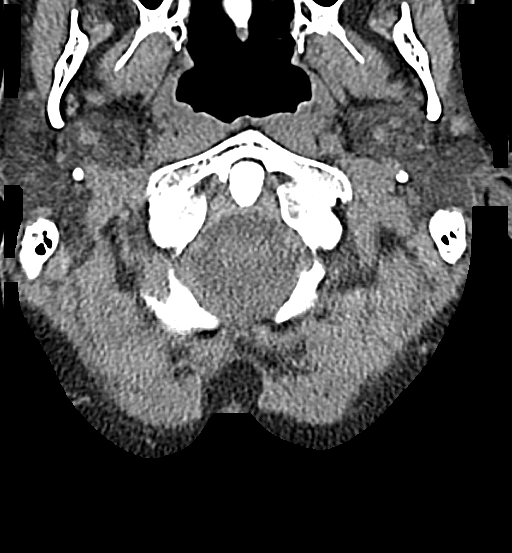
[im 77/93  bone]
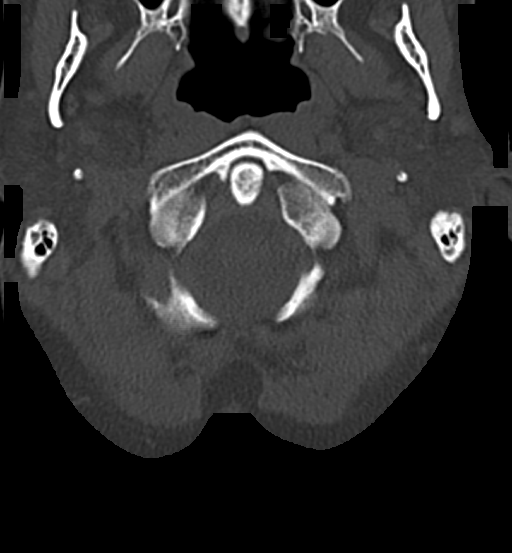

[12 of 33 positions shown; findings below may reference images not displayed]

FINDINGS: CT HEAD FINDINGS

Brain: No evidence of acute infarction, hemorrhage, hydrocephalus,
extra-axial collection or mass lesion/mass effect.

Vascular: Negative for hyperdense vessel

Skull: Negative

Sinuses/Orbits: Negative

Other: None

CT CERVICAL SPINE FINDINGS

Alignment: Normal

Skull base and vertebrae: Negative for fracture or mass

Soft tissues and spinal canal: Negative

Disc levels: Mild facet degeneration on the left at C4-5. No
significant disc degeneration or stenosis.

Upper chest: Apical pleural and parenchymal scarring bilaterally.

Other: None
IMPRESSION: Negative CT head

Mild facet degeneration on the left at C4-5. Negative for disc
degeneration or stenosis in the cervical spine.

## 2017-02-13 MED ORDER — CIPROFLOXACIN IN D5W 400 MG/200ML IV SOLN: 400.0000 mg | Freq: Once | INTRAVENOUS | Status: DC

## 2017-02-13 MED ORDER — SODIUM CHLORIDE 0.9 % IV BOLUS (SEPSIS)
1000.0000 mL | Freq: Once | INTRAVENOUS | Status: AC
  Administered 2017-02-13: 1000 mL via INTRAVENOUS

## 2017-02-13 MED ORDER — CIPROFLOXACIN HCL 500 MG PO TABS: 500.0000 mg | ORAL_TABLET | Freq: Two times a day (BID) | ORAL | 0 refills | Status: DC

## 2017-02-13 MED ORDER — CIPROFLOXACIN HCL 500 MG PO TABS
500.0000 mg | ORAL_TABLET | Freq: Once | ORAL | Status: AC
  Administered 2017-02-13: 500 mg via ORAL
  Filled 2017-02-13: qty 1

## 2017-02-13 MED ORDER — CIPROFLOXACIN HCL 500 MG PO TABS
ORAL_TABLET | ORAL | Status: AC
  Filled 2017-02-13: qty 1

## 2017-02-13 NOTE — Discharge Instructions (Signed)
Take cipro twice daily for a week for kidney infection.   Stay hydrated.   You should follow up with your primary care doctor.   See neurology for follow up for your weakness and numbness  Return to ER if you have worse weakness, numbness, fever, vomiting.

## 2017-02-13 NOTE — ED Triage Notes (Signed)
C/o numbness, tingling to bilat UE and LE x 1 weeks-c/o bilat leg pain x months-c/o SOB x today-pt NAD-presents to triage in w/c

## 2017-02-13 NOTE — ED Notes (Signed)
Pt verbalizes understanding of d/c instructions and denies any further needs at this time. 

## 2017-02-13 NOTE — ED Provider Notes (Signed)
[facility] EMERGENCY DEPARTMENT Provider Note   CSN: [id number] Arrival date & time: 02/13/17  1438     History   Chief Complaint Chief Complaint  Patient presents with  . Numbness    HPI Gabriela Campbell is a 52 y.o. female history of shingles here presenting with numbness, weakness, congestion.  Patient states that for the last several months she has numbness and weakness to bilateral arms and legs.  Is also associated with some paresthesias.  Patient had seen primary care doctor several months ago and was thought to have neuromuscular disorder but had a negative ESR and normal CBC, CMP and CK levels.  She was referred to neurology but has not seen them yet.  She states that over the last week or 2, she has progressive cough and congestion.  She states that she had to breathe through her mouth occasionally and some subjective shortness of breath.  Denies any chest pain.  Denies any leg swelling.  Moreover, patient has been having bilateral flank pain for the last several days but no nausea or vomiting.  The history is provided by the patient.    Past Medical History:  Diagnosis Date  . Shingles     Patient Active Problem List   Diagnosis Date Noted  . Bilateral leg pain 10/10/2016  . Smoker 10/10/2016  . Loss of weight 10/10/2016  . Leg weakness, bilateral 10/10/2016    Past Surgical History:  Procedure Laterality Date  . ABDOMINAL HYSTERECTOMY    . CHOLECYSTECTOMY      OB History    No data available       Home Medications    Prior to Admission medications   Medication Sig Start Date End Date Taking? Authorizing Provider  cholecalciferol (VITAMIN D) 1000 units tablet Take 1,000 Units by mouth daily.    [provider]  cyclobenzaprine (FLEXERIL) 10 MG tablet Take 1 tablet (10 mg total) by mouth 2 (two) times daily as needed for muscle spasms. Patient not taking: Reported on 05/03/2016 04/17/16   McDonald, Mia A, PA-C  diphenhydrAMINE  (BENADRYL) 25 MG tablet Take 1 tablet (25 mg total) by mouth 3 (three) times daily. Take one tablet three times daily for two days Patient not taking: Reported on 05/08/2016 05/03/16   Carmin Muskrat, MD  famotidine (PEPCID) 20 MG tablet Take 1 tablet (20 mg total) by mouth 2 (two) times daily. Take one tablet twice daily for two days Patient not taking: Reported on 05/08/2016 05/03/16   Carmin Muskrat, MD  HYDROcodone-acetaminophen (NORCO/VICODIN) 5-325 MG tablet Take 2 tablets by mouth every 4 (four) hours as needed. Patient not taking: Reported on 05/16/2016 05/08/16   [place] Lanius A, PA-C  ondansetron (ZOFRAN) 4 MG tablet Take 1 tablet (4 mg total) by mouth every 6 (six) hours. Patient not taking: Reported on 05/16/2016 05/08/16   Volanda Napoleon, PA-C  predniSONE (DELTASONE) 20 MG tablet Take 2 tablets (40 mg total) by mouth daily with breakfast. For the next four days Patient not taking: Reported on 05/08/2016 05/03/16   Carmin Muskrat, MD  vitamin B-12 (CYANOCOBALAMIN) 250 MCG tablet Take 250 mcg by mouth daily.    [provider]  vitamin C (ASCORBIC ACID) 500 MG tablet Take 500 mg by mouth daily.    [provider]    Family History No family history on file.  Social History Social History   Tobacco Use  . Smoking status: Current Every Day Smoker    Packs/day: 1.00  Types: Cigarettes  . Smokeless tobacco: Never Used  Substance Use Topics  . Alcohol use: No  . Drug use: No     Allergies   Ibuprofen; Iodine; Tramadol; Codeine; Dilaudid [hydromorphone hcl]; and Penicillins   Review of Systems Review of Systems  Respiratory: Positive for cough.   Gastrointestinal: Positive for abdominal pain.  Neurological: Positive for numbness.  All other systems reviewed and are negative.    Physical Exam Updated Vital Signs BP (!) 138/111 (BP Location: Right Arm)   Pulse 100   Temp 98 F (36.7 C) (Oral)   Resp 20   Ht _0  (1.575 m)   Wt 43.1 kg (95 lb)    SpO2 100%   BMI 17.38 kg/m   Physical Exam  Constitutional: She is oriented to person, place, and time. She appears well-developed and well-nourished.  Anxious   HENT:  Head: Normocephalic.  Mouth/Throat: Oropharynx is clear and moist.  Eyes: Conjunctivae and EOM are normal. Pupils are equal, round, and reactive to light.  Neck: Normal range of motion. Neck supple.  Cardiovascular: Normal rate, regular rhythm and normal heart sounds.  Pulmonary/Chest: Effort normal and breath sounds normal. No stridor. No respiratory distress. She has no wheezes.  Abdominal: Soft. Bowel sounds are normal.  Mild epigastric tenderness, mild bilateral CVAT   Musculoskeletal: Normal range of motion.  Mild bilateral proximal thigh tenderness, no obvious deformity.   Neurological: She is alert and oriented to person, place, and time.  CN 2-12 intact. Sensation intact. Strength 5/5 throughout upper and lower extremities   Skin: Skin is warm.  Psychiatric: She has a normal mood and affect.  Nursing note and vitals reviewed.    ED Treatments / Results  Labs (all labs ordered are listed, but only abnormal results are displayed) Labs Reviewed  CBC WITH DIFFERENTIAL/PLATELET - Abnormal; Notable for the following components:      Result Value   WBC 11.1 (*)    Hemoglobin 16.2 (*)    HCT 46.1 (*)    Neutro Abs 8.1 (*)    All other components within normal limits  COMPREHENSIVE METABOLIC PANEL - Abnormal; Notable for the following components:   Alkaline Phosphatase 156 (*)    All other components within normal limits  CK - Abnormal; Notable for the following components:   Total CK 25 (*)    All other components within normal limits  URINALYSIS, ROUTINE W REFLEX MICROSCOPIC - Abnormal; Notable for the following components:   Hgb urine dipstick MODERATE (*)    Nitrite POSITIVE (*)    Leukocytes, UA TRACE (*)    All other components within normal limits  URINALYSIS, MICROSCOPIC (REFLEX) - Abnormal;  Notable for the following components:   Bacteria, UA MANY (*)    Squamous Epithelial / LPF 0-5 (*)    All other components within normal limits  TROPONIN I  LIPASE, BLOOD    EKG  EKG Interpretation  Date/Time:  Wednesday February 13 2017 15:35:07 EST Ventricular Rate:  79 PR Interval:    QRS Duration: 98 QT Interval:  371 QTC Calculation: 426 R Axis:   93 Text Interpretation:  Sinus rhythm Borderline right axis deviation No significant change since last tracing Confirmed by Wandra Arthurs [phone]) on 02/13/2017 3:49:45 PM       Radiology Dg Chest 2 View  Result Date: 02/13/2017 CLINICAL DATA:  Shortness of breath for 1 week in a smoker. EXAM: CHEST  2 VIEW COMPARISON:  PA and lateral chest 10/10/2016 and 05/16/2016.  FINDINGS: The lungs are emphysematous with biapical scar. No consolidative process, nodule, mass, effusion or pneumothorax. Heart size is normal. No acute bony abnormality. IMPRESSION: COPD without acute disease. Electronically Signed   By: Inge Rise M.D.   On: 02/13/2017 15:55   Ct Head Wo Contrast  Result Date: 02/13/2017 CLINICAL DATA:  Numbness tingling bilateral arms and legs. Radiculopathy EXAM: CT HEAD WITHOUT CONTRAST CT CERVICAL SPINE WITHOUT CONTRAST TECHNIQUE: Multidetector CT imaging of the head and cervical spine was performed following the standard protocol without intravenous contrast. Multiplanar CT image reconstructions of the cervical spine were also generated. COMPARISON:  None. FINDINGS: CT HEAD FINDINGS Brain: No evidence of acute infarction, hemorrhage, hydrocephalus, extra-axial collection or mass lesion/mass effect. Vascular: Negative for hyperdense vessel Skull: Negative Sinuses/Orbits: Negative Other: None CT CERVICAL SPINE FINDINGS Alignment: Normal Skull base and vertebrae: Negative for fracture or mass Soft tissues and spinal canal: Negative Disc levels: Mild facet degeneration on the left at C4-5. No significant disc degeneration or stenosis.  Upper chest: Apical pleural and parenchymal scarring bilaterally. Other: None IMPRESSION: Negative CT head Mild facet degeneration on the left at C4-5. Negative for disc degeneration or stenosis in the cervical spine. Electronically Signed   By: Franchot Gallo M.D.   On: 02/13/2017 16:22   Ct Cervical Spine Wo Contrast  Result Date: 02/13/2017 CLINICAL DATA:  Numbness tingling bilateral arms and legs. Radiculopathy EXAM: CT HEAD WITHOUT CONTRAST CT CERVICAL SPINE WITHOUT CONTRAST TECHNIQUE: Multidetector CT imaging of the head and cervical spine was performed following the standard protocol without intravenous contrast. Multiplanar CT image reconstructions of the cervical spine were also generated. COMPARISON:  None. FINDINGS: CT HEAD FINDINGS Brain: No evidence of acute infarction, hemorrhage, hydrocephalus, extra-axial collection or mass lesion/mass effect. Vascular: Negative for hyperdense vessel Skull: Negative Sinuses/Orbits: Negative Other: None CT CERVICAL SPINE FINDINGS Alignment: Normal Skull base and vertebrae: Negative for fracture or mass Soft tissues and spinal canal: Negative Disc levels: Mild facet degeneration on the left at C4-5. No significant disc degeneration or stenosis. Upper chest: Apical pleural and parenchymal scarring bilaterally. Other: None IMPRESSION: Negative CT head Mild facet degeneration on the left at C4-5. Negative for disc degeneration or stenosis in the cervical spine. Electronically Signed   By: Franchot Gallo M.D.   On: 02/13/2017 16:22   Ct Renal Stone Study  Result Date: 02/13/2017 CLINICAL DATA:  Right flank pain since yesterday. Remote history of prior kidney stone. EXAM: CT ABDOMEN AND PELVIS WITHOUT CONTRAST TECHNIQUE: Multidetector CT imaging of the abdomen and pelvis was performed following the standard protocol without IV contrast. COMPARISON:  CT abdomen pelvis dated May 08, 2016. FINDINGS: Lower chest: No acute abnormality. Hepatobiliary: Unchanged  subcentimeter low-density lesion near the liver dome, still too small to characterize but statistically likely a cyst. Prior cholecystectomy. No biliary dilatation. Pancreas: Unremarkable. No pancreatic ductal dilatation or surrounding inflammatory changes. Spleen: Normal in size without focal abnormality. Adrenals/Urinary Tract: Adrenal glands are unremarkable. Kidneys are normal, without renal calculi, focal lesion, or hydronephrosis. Bladder is unremarkable. Stomach/Bowel: Stomach is within normal limits. Appendix appears normal. No evidence of bowel wall thickening, distention, or inflammatory changes. Vascular/Lymphatic: No significant vascular findings are present. No enlarged abdominal or pelvic lymph nodes. Reproductive: Status post hysterectomy. No adnexal masses. Other: No ascites or pneumoperitoneum. Musculoskeletal: No acute or significant osseous findings. IMPRESSION: 1.  No acute intra-abdominal process. Electronically Signed   By: Titus Dubin M.D.   On: 02/13/2017 16:11    Procedures Procedures (including critical  care time)  Medications Ordered in ED Medications  ciprofloxacin (CIPRO) tablet 500 mg (not administered)  sodium chloride 0.9 % bolus 1,000 mL (1,000 mLs Intravenous New Bag/Given 02/13/17 1606)     Initial Impression / Assessment and Plan / ED Course  I have reviewed the triage vital signs and the nursing notes.  Pertinent labs & imaging results that were available during my care of the patient were reviewed by me and considered in my medical decision making (see chart for details).    BREEAN NANNINI is a 52 y.o. female here with cough, congestion, weakness, flank pain. Weakness and paresthesias for several months and had negative ESR before. Unclear if she has radiculopathy vs neuromuscular disorder. Consider rhabdo in the setting of viral syndrome but she has no objective weakness to make me concerned for GBS. I have low suspicion for stroke. Also consider pyelo  vs pneumonia vs viral syndrome. Will get labs, UA, CT head/neck, CT renal stone. Will hydrate and reassess.   5:52 PM UA + UTI. Given cipro (has PCN allergy). CT renal stone showed no stone. CT head and labs unremarkable. CK normal. She has chronic weakness that likely got worse in the setting of pyelo. Afebrile, doesn't appear septic. Will dc home with a week of cipro.   Final Clinical Impressions(s) / ED Diagnoses   Final diagnoses:  None    ED Discharge Orders    None       Drenda Freeze, MD 02/13/17 1753

## 2017-02-18 ENCOUNTER — Emergency Department (HOSPITAL_BASED_OUTPATIENT_CLINIC_OR_DEPARTMENT_OTHER)
Admission: EM | Admit: 2017-02-18 | Discharge: 2017-02-19 | Disposition: A | Discharge status: Home / Self Care | Payer: 59 | Attending: Emergency Medicine | Admitting: Emergency Medicine

## 2017-02-18 ENCOUNTER — Emergency Department (HOSPITAL_BASED_OUTPATIENT_CLINIC_OR_DEPARTMENT_OTHER): Payer: 59

## 2017-02-18 ENCOUNTER — Encounter (HOSPITAL_BASED_OUTPATIENT_CLINIC_OR_DEPARTMENT_OTHER): Payer: Self-pay

## 2017-02-18 ENCOUNTER — Other Ambulatory Visit: Payer: Self-pay

## 2017-02-18 DIAGNOSIS — F17210 Nicotine dependence, cigarettes, uncomplicated: Secondary | ICD-10-CM | POA: Insufficient documentation

## 2017-02-18 DIAGNOSIS — R0782 Intercostal pain: Secondary | ICD-10-CM | POA: Diagnosis not present

## 2017-02-18 DIAGNOSIS — Z79899 Other long term (current) drug therapy: Secondary | ICD-10-CM | POA: Diagnosis not present

## 2017-02-18 DIAGNOSIS — R109 Unspecified abdominal pain: Secondary | ICD-10-CM | POA: Diagnosis not present

## 2017-02-18 LAB — CBC WITH DIFFERENTIAL/PLATELET
BASOS ABS: 0 10*3/uL (ref 0.0–0.1)
Basophils Relative: 0 %
EOS PCT: 0 %
Eosinophils Absolute: 0.1 10*3/uL (ref 0.0–0.7)
HEMATOCRIT: 47.2 % — AB (ref 36.0–46.0)
Hemoglobin: 16.6 g/dL — ABNORMAL HIGH (ref 12.0–15.0)
LYMPHS PCT: 23 %
Lymphs Abs: 3.5 10*3/uL (ref 0.7–4.0)
MCH: 32.5 pg (ref 26.0–34.0)
MCHC: 35.2 g/dL (ref 30.0–36.0)
MCV: 92.4 fL (ref 78.0–100.0)
Monocytes Absolute: 0.8 10*3/uL (ref 0.1–1.0)
Monocytes Relative: 6 %
NEUTROS ABS: 10.9 10*3/uL — AB (ref 1.7–7.7)
Neutrophils Relative %: 71 %
PLATELETS: 205 10*3/uL (ref 150–400)
RBC: 5.11 MIL/uL (ref 3.87–5.11)
RDW: 13 % (ref 11.5–15.5)
WBC: 15.3 10*3/uL — ABNORMAL HIGH (ref 4.0–10.5)

## 2017-02-18 LAB — BASIC METABOLIC PANEL
ANION GAP: 11 (ref 5–15)
BUN: 19 mg/dL (ref 6–20)
CO2: 26 mmol/L (ref 22–32)
Calcium: 9.5 mg/dL (ref 8.9–10.3)
Chloride: 101 mmol/L (ref 101–111)
Creatinine, Ser: 0.9 mg/dL (ref 0.44–1.00)
GFR calc Af Amer: >60 mL/min (ref >60)
Glucose, Bld: 98 mg/dL (ref 65–99)
POTASSIUM: 3.6 mmol/L (ref 3.5–5.1)
SODIUM: 138 mmol/L (ref 135–145)

## 2017-02-18 LAB — URINALYSIS, ROUTINE W REFLEX MICROSCOPIC
Bilirubin Urine: NEGATIVE
GLUCOSE, UA: NEGATIVE mg/dL
Ketones, ur: NEGATIVE mg/dL
Leukocytes, UA: NEGATIVE
NITRITE: NEGATIVE
PROTEIN: NEGATIVE mg/dL
Specific Gravity, Urine: 1.01 (ref 1.005–1.030)
pH: 5.5 (ref 5.0–8.0)

## 2017-02-18 LAB — URINALYSIS, MICROSCOPIC (REFLEX)

## 2017-02-18 IMAGING — CR DG RIBS W/ CHEST 3+V*R*
4 series · 4 of 4 positions shown · non-contrast
Comparison: [DATE], [DATE], [DATE]

CLINICAL DATA: Rib pain

EXAM:
RIGHT RIBS AND CHEST - 3+ VIEW

[w chest pa]
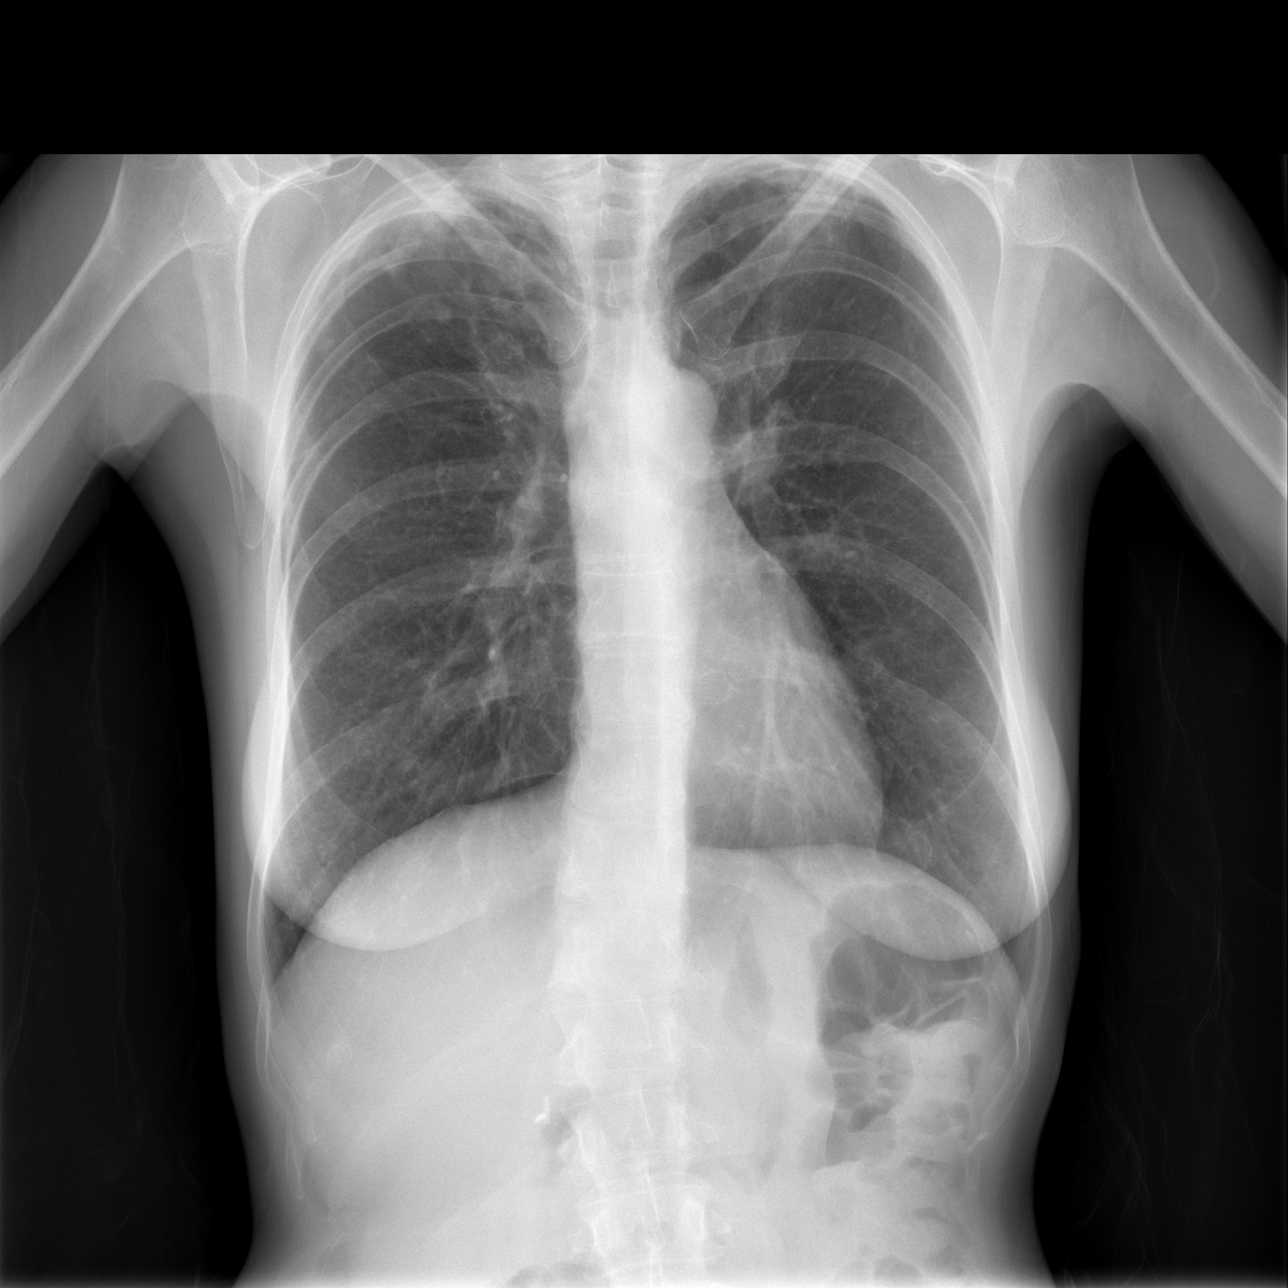

[w ribs ap/pa upper right]
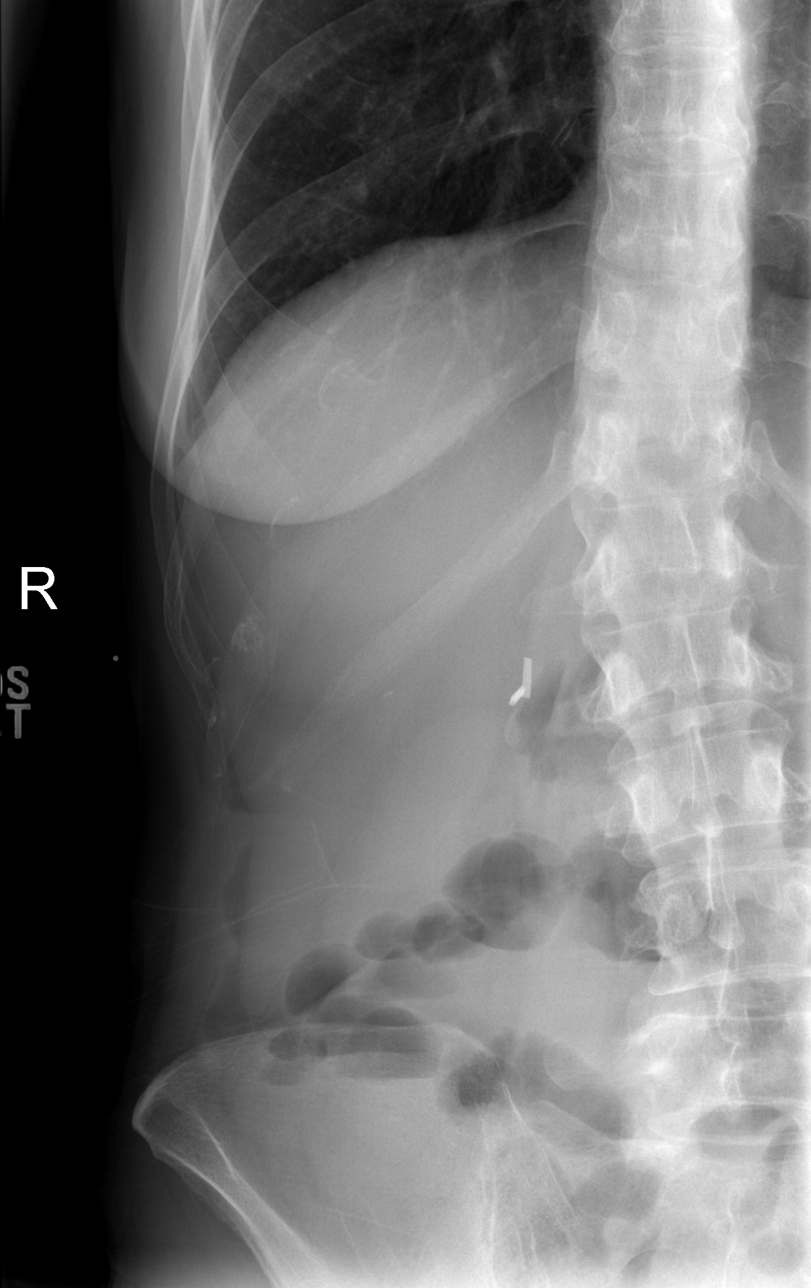

[w ribs ap/pa lower right (1 of 2)]
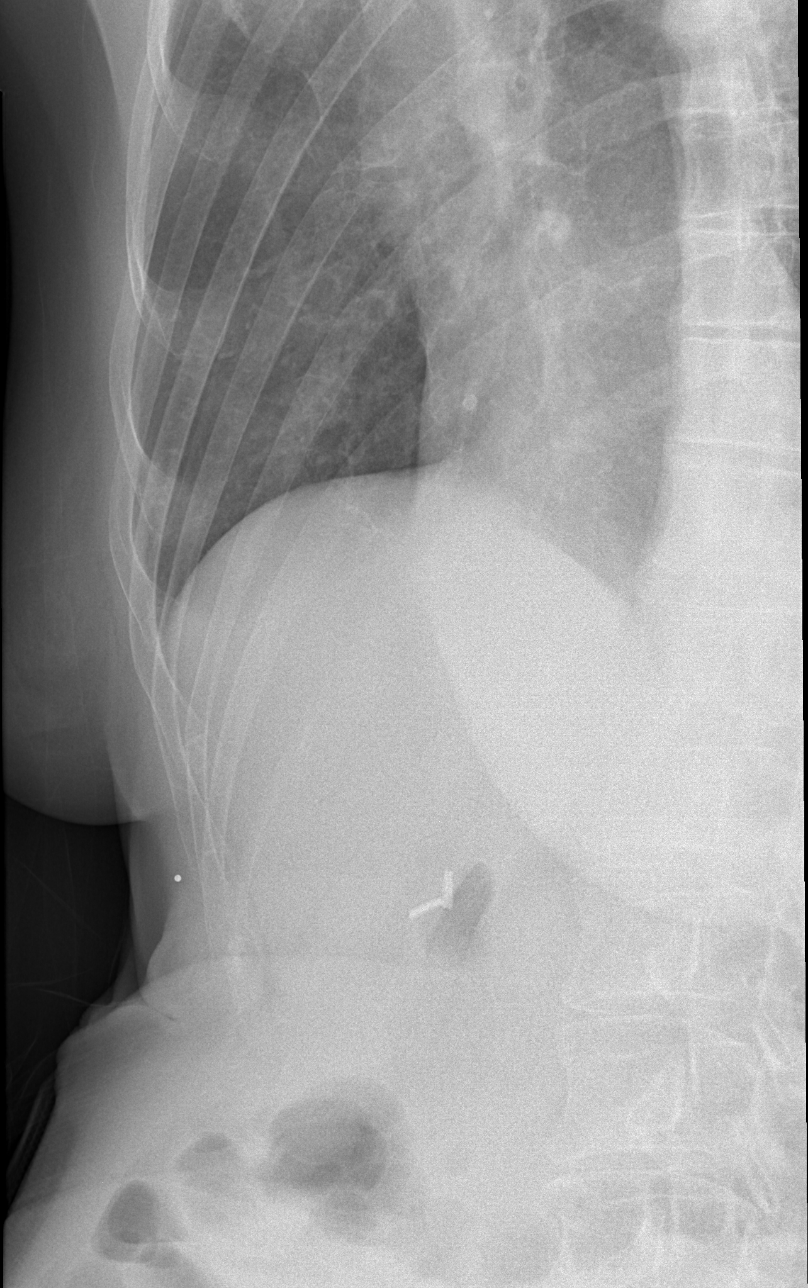

[w ribs ap/pa lower right (2 of 2)]
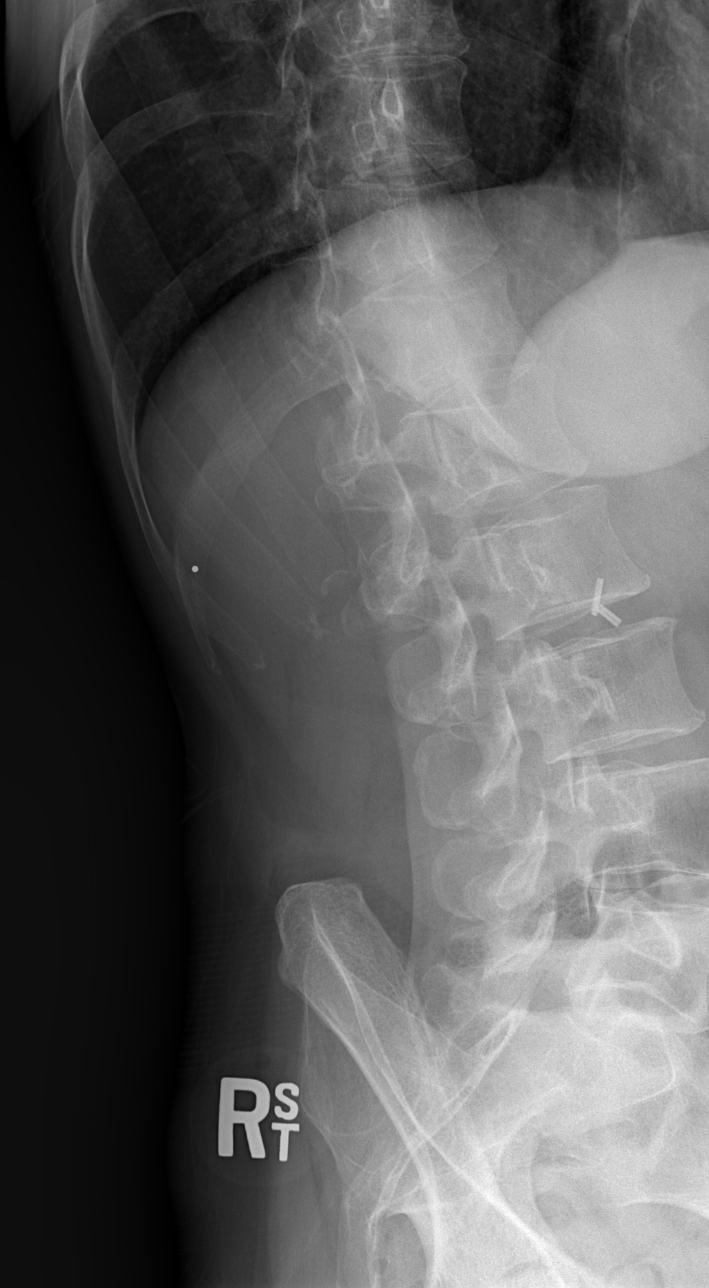

[4 of 4 positions shown; findings below may reference images not displayed]

FINDINGS: Single-view chest demonstrates apical pleural and parenchymal
scarring and nodularity, similar compared to prior. No acute
consolidation, pleural effusion or pneumothorax is visualized.
Normal heart size. Surgical clips in the right upper quadrant.

Right rib series demonstrates no definite acute displaced right rib
fracture
IMPRESSION: 1. Stable pleural and parenchymal scarring at the apices. Negative
for pneumothorax or pleural effusion
2. No definite acute displaced right rib fracture is seen

## 2017-02-18 NOTE — ED Notes (Signed)
Patient stated that she has been unsteady on her foot for 5 months but has no PCP.

## 2017-02-18 NOTE — ED Notes (Signed)
ED Provider at bedside. 

## 2017-02-18 NOTE — ED Notes (Signed)
Also c/o of right rib/side pain after being picked up by husband today

## 2017-02-18 NOTE — ED Triage Notes (Signed)
Pt c/o cont'd right flank pain,weakness,numbness to bilat UE, bliat LE, back and abd-states she was seen for the same c/o 2/12-states she feels no better-NAD-presents to triage in w/c

## 2017-02-19 ENCOUNTER — Emergency Department (HOSPITAL_BASED_OUTPATIENT_CLINIC_OR_DEPARTMENT_OTHER): Payer: 59

## 2017-02-19 IMAGING — CT CT RENAL STONE PROTOCOL
2 of 4 series · 16 of 46 positions shown, 18 images · non-contrast
Comparison: CT [DATE], [DATE]

CLINICAL DATA: Intermittent right flank pain

EXAM:
CT ABDOMEN AND PELVIS WITHOUT CONTRAST
TECHNIQUE: Multidetector CT imaging of the abdomen and pelvis was performed
following the standard protocol without IV contrast.

[Series 2: axial st · axial · 0.66mm/px · z∈[-414,-24]mm · 13 of 86 slices shown, 15 images]
[im 4/86  soft-tissue]
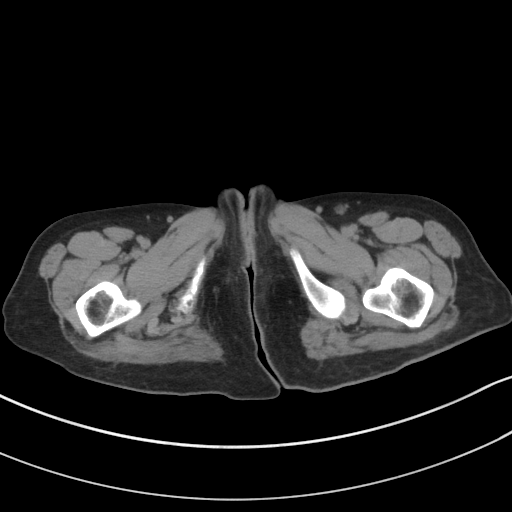
[im 4/86  bone]
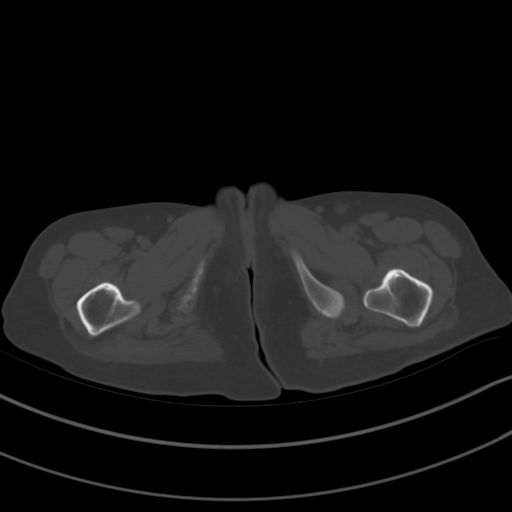
[im 11/86  soft-tissue]
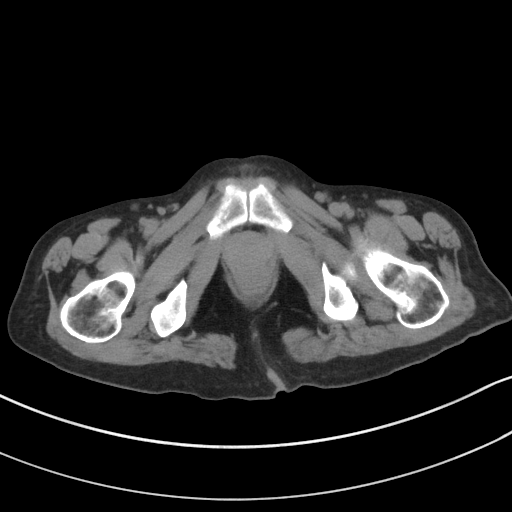
[im 18/86  soft-tissue]
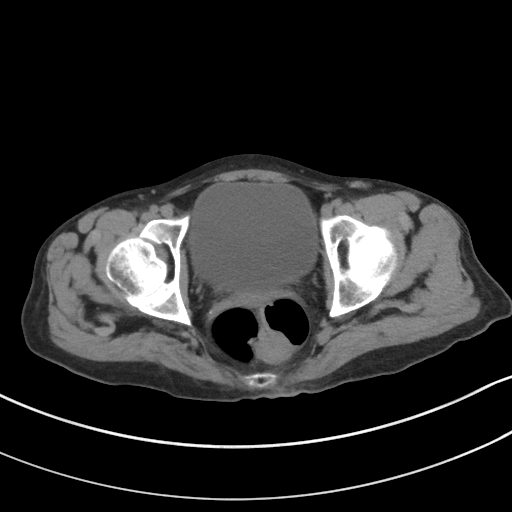
[im 25/86  soft-tissue]
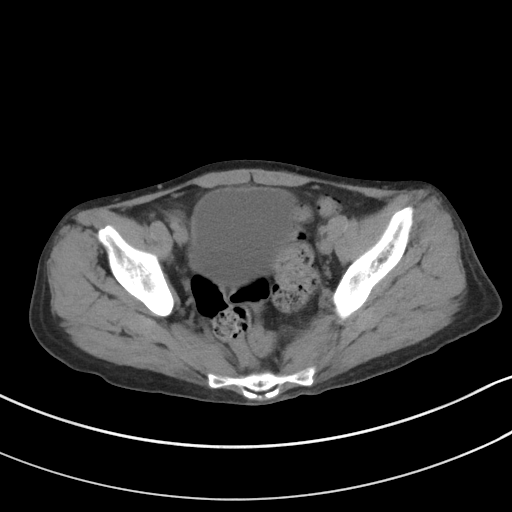
[im 29/86  soft-tissue]
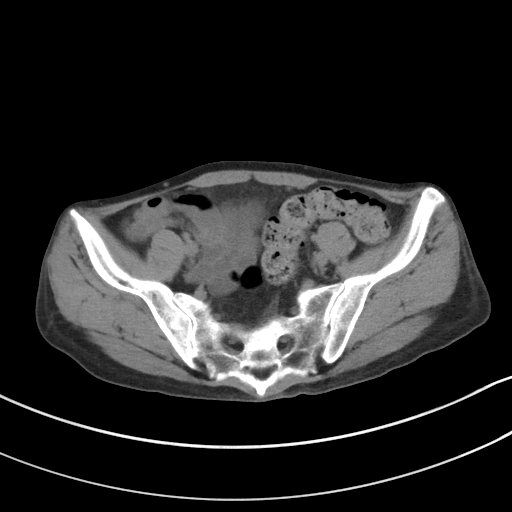
[im 36/86  soft-tissue]
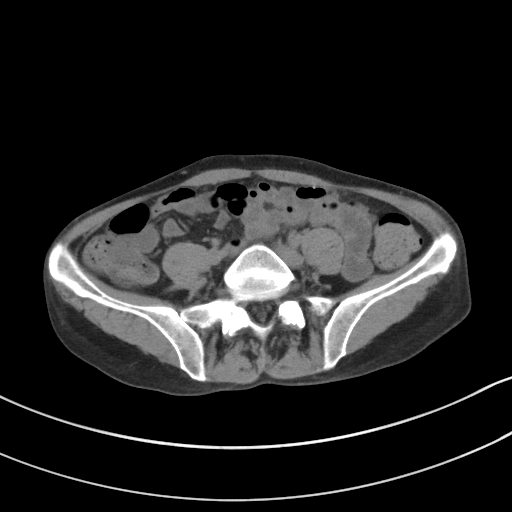
[im 43/86  soft-tissue]
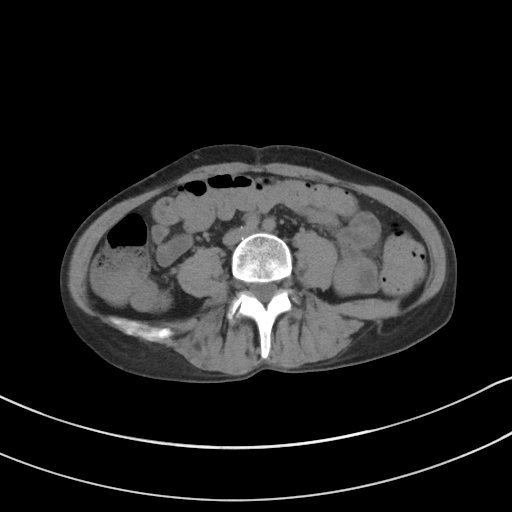
[im 50/86  soft-tissue]
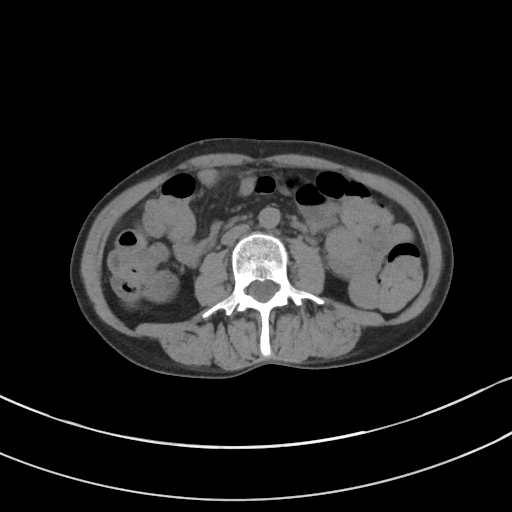
[im 57/86  soft-tissue]
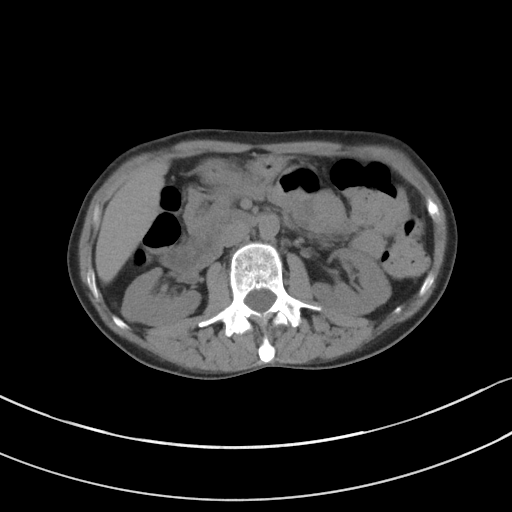
[im 57/86  bone]
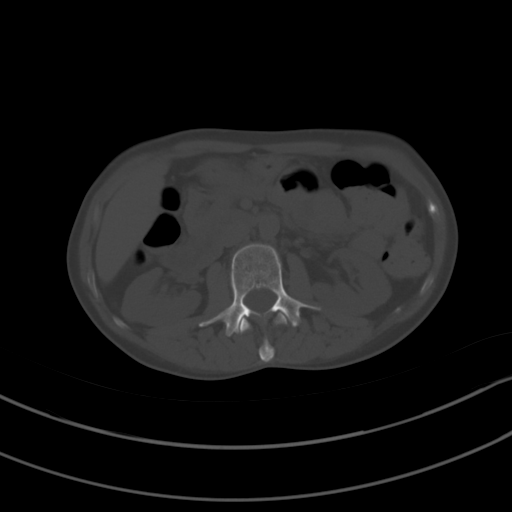
[im 61/86  soft-tissue]
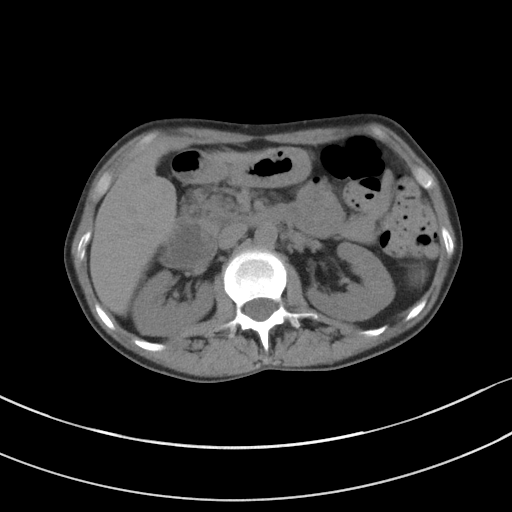
[im 68/86  soft-tissue]
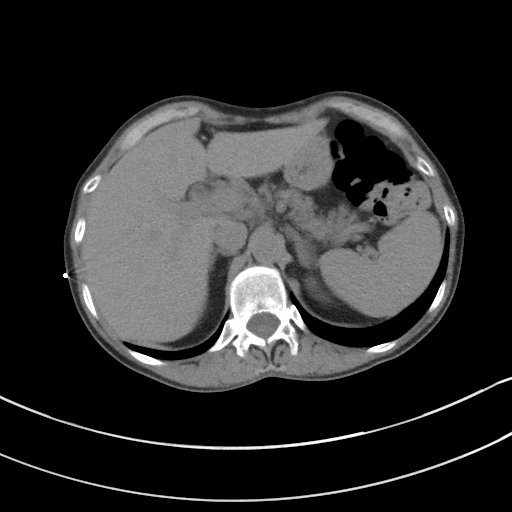
[im 75/86  soft-tissue]
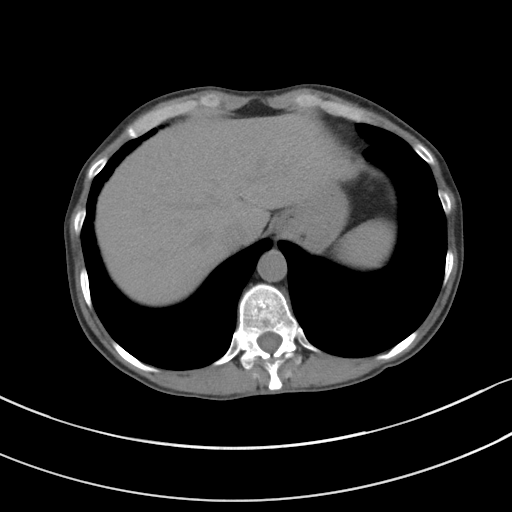
[im 82/86  soft-tissue]
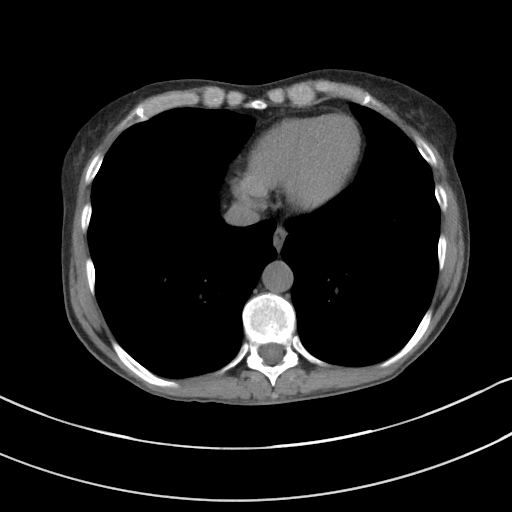

[Series 5: coronal st · coronal · 0.61mm/px · 3 of 68 slices shown]
[im 23/68  soft-tissue]
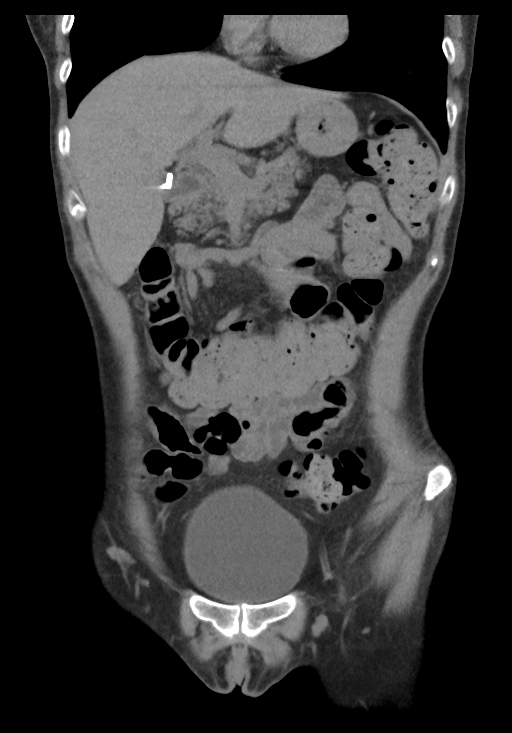
[im 30/68  soft-tissue]
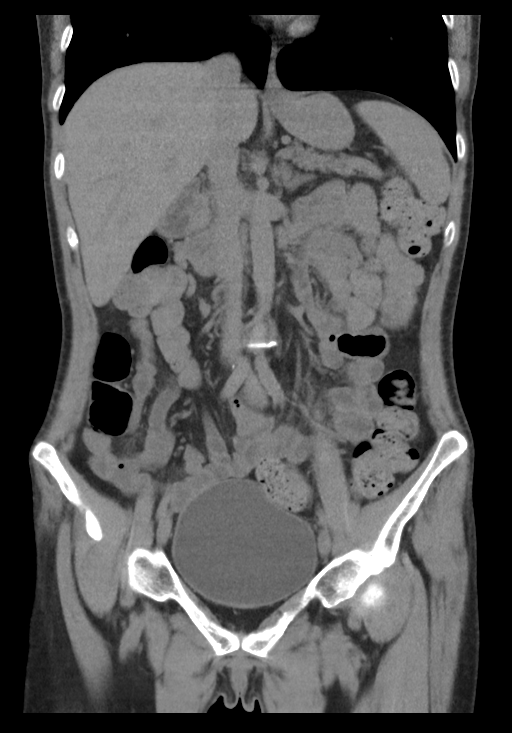
[im 38/68  soft-tissue]
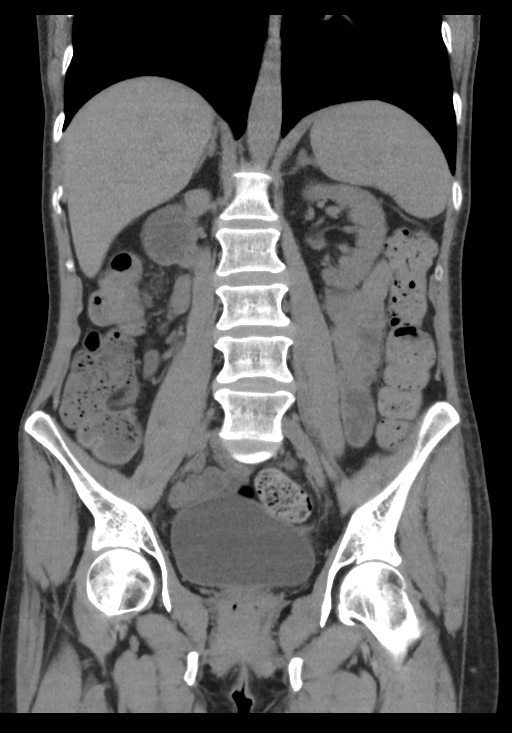

[16 of 46 positions shown; findings below may reference images not displayed]

FINDINGS: Lower chest: No acute abnormality.

Hepatobiliary: Subcentimeter hypodensity at the dome of the liver,
no change. Status post cholecystectomy. No biliary dilatation.

Pancreas: Unremarkable. No pancreatic ductal dilatation or
surrounding inflammatory changes.

Spleen: Normal in size without focal abnormality.

Adrenals/Urinary Tract: Adrenal glands are unremarkable. Kidneys are
normal, without renal calculi, focal lesion, or hydronephrosis.
Bladder is unremarkable.

Stomach/Bowel: Stomach is within normal limits. Appendix appears
normal. No evidence of bowel wall thickening, distention, or
inflammatory changes.

Vascular/Lymphatic: No significant vascular findings are present. No
enlarged abdominal or pelvic lymph nodes.

Reproductive: Status post hysterectomy. No adnexal masses.

Other: Negative for free air or free fluid. Small fat in the
umbilical region

Musculoskeletal: No acute or significant osseous findings.
IMPRESSION: 1. No CT evidence for acute intra-abdominal or pelvic abnormality.
2. Negative for hydronephrosis or ureteral stone

## 2017-02-19 MED ORDER — CYCLOBENZAPRINE HCL 5 MG PO TABS
5.0000 mg | ORAL_TABLET | Freq: Once | ORAL | Status: AC
  Administered 2017-02-19: 5 mg via ORAL
  Filled 2017-02-19: qty 1

## 2017-02-19 MED ORDER — CYCLOBENZAPRINE HCL 10 MG PO TABS: 10.0000 mg | ORAL_TABLET | Freq: Two times a day (BID) | ORAL | 0 refills | Status: AC | PRN

## 2017-02-19 NOTE — Discharge Instructions (Addendum)
Continue taking antibiotics as directed.  Follow-up with your neurologist.  You can take Tylenol for your pain.  Take Flexeril as prescribed. This medication will make you drowsy so do not drive or drink alcohol when taking it.  Return the emergency department for fever, pain with urination, blood in urine, abdominal pain, persistent vomiting, difficulty walking or any other worsening or concerning symptoms.

## 2017-02-19 NOTE — ED Notes (Signed)
PT discharged to home with husband. NAD 

## 2017-02-19 NOTE — ED Provider Notes (Signed)
[facility] EMERGENCY DEPARTMENT Provider Note   CSN: [id number] Arrival date & time: 02/18/17  1820     History   Chief Complaint Chief Complaint  Patient presents with  . Flank Pain    HPI Gabriela Campbell is a 52 y.o. female past medical history of shingles who presents for evaluation of continued right flank pain.  Patient was seen in the ED on 02/13/17 and was diagnosed with pyelonephritis at that time.  She was discharged home with antibiotics.  Patient comes to the ED because she states that "she is not getting better is wondered she has not gotten better."  She reports that she is continued to have some right flank pain.  Additionally, patient states that she started having some right lateral side/rib pain that began after her husband picked her up and pressed on her side.  Patient denies any preceding trauma, injury, fall.  Patient does have a history of numbness to the bilateral and lower extremities.  Patient states that she is scheduled to see neurology for evaluation of a neuromuscular disorder.  Patient reports that she is continued to experience symptoms but denies any new or changes in symptoms.  Patient is able to ambulate.  Patient states she has been compliant with her antibiotics.  Patient denies any fever, chest pain, difficulty breathing, rash, saddle anesthesia, urinary or bowel incontinence, history of back surgery, history of IV drug use.  The history is provided by the patient.    Past Medical History:  Diagnosis Date  . Shingles     Patient Active Problem List   Diagnosis Date Noted  . Bilateral leg pain 10/10/2016  . Smoker 10/10/2016  . Loss of weight 10/10/2016  . Leg weakness, bilateral 10/10/2016    Past Surgical History:  Procedure Laterality Date  . ABDOMINAL HYSTERECTOMY    . CHOLECYSTECTOMY      OB History    No data available       Home Medications    Prior to Admission medications   Medication Sig Start Date End Date  Taking? Authorizing Provider  cholecalciferol (VITAMIN D) 1000 units tablet Take 1,000 Units by mouth daily.    [provider]  ciprofloxacin (CIPRO) 500 MG tablet Take 1 tablet (500 mg total) by mouth 2 (two) times daily. One po bid x 7 days 02/13/17   Charlynne Pander, MD  cyclobenzaprine (FLEXERIL) 10 MG tablet Take 1 tablet (10 mg total) by mouth 2 (two) times daily as needed for up to 7 days for muscle spasms. 02/19/17 02/26/17  Maxwell Caul, PA-C  diphenhydrAMINE (BENADRYL) 25 MG tablet Take 1 tablet (25 mg total) by mouth 3 (three) times daily. Take one tablet three times daily for two days Patient not taking: Reported on 05/08/2016 05/03/16   Gerhard Munch, MD  famotidine (PEPCID) 20 MG tablet Take 1 tablet (20 mg total) by mouth 2 (two) times daily. Take one tablet twice daily for two days Patient not taking: Reported on 05/08/2016 05/03/16   Gerhard Munch, MD  HYDROcodone-acetaminophen (NORCO/VICODIN) 5-325 MG tablet Take 2 tablets by mouth every 4 (four) hours as needed. Patient not taking: Reported on 05/16/2016 05/08/16   Graciella Freer A, PA-C  ondansetron (ZOFRAN) 4 MG tablet Take 1 tablet (4 mg total) by mouth every 6 (six) hours. Patient not taking: Reported on 05/16/2016 05/08/16   Graciella Freer A, PA-C  predniSONE (DELTASONE) 20 MG tablet Take 2 tablets (40 mg total) by mouth daily with breakfast. For  the next four days Patient not taking: Reported on 05/08/2016 05/03/16   Gerhard Munch, MD  vitamin B-12 (CYANOCOBALAMIN) 250 MCG tablet Take 250 mcg by mouth daily.    [provider]  vitamin C (ASCORBIC ACID) 500 MG tablet Take 500 mg by mouth daily.    [provider]    Family History No family history on file.  Social History Social History   Tobacco Use  . Smoking status: Current Every Day Smoker    Packs/day: 1.00    Types: Cigarettes  . Smokeless tobacco: Never Used  Substance Use Topics  . Alcohol use: No  . Drug use: No      Allergies   Ibuprofen; Iodine; Tramadol; Codeine; Dilaudid [hydromorphone hcl]; and Penicillins   Review of Systems Review of Systems  Constitutional: Negative for chills and fever.  Respiratory: Negative for cough and shortness of breath.   Cardiovascular: Negative for chest pain.  Gastrointestinal: Negative for abdominal pain, diarrhea, nausea and vomiting.  Genitourinary: Positive for flank pain. Negative for dysuria and hematuria.  Musculoskeletal: Negative for back pain and neck pain.  Skin: Negative for rash.  Neurological: Positive for numbness (chronic). Negative for dizziness, weakness and headaches.  All other systems reviewed and are negative.    Physical Exam Updated Vital Signs BP 124/78 (BP Location: Right Arm)   Pulse 87   Temp 98.6 F (37 C) (Oral)   Resp 18   Ht 5\' 2"  (1.575 m)   Wt 43.1 kg (95 lb)   SpO2 100%   BMI 17.38 kg/m   Physical Exam  Constitutional: She is oriented to person, place, and time. She appears well-developed and well-nourished.  Sitting comfortably on examination table  HENT:  Head: Normocephalic and atraumatic.  Mouth/Throat: Oropharynx is clear and moist and mucous membranes are normal.  Eyes: Conjunctivae, EOM and lids are normal. Pupils are equal, round, and reactive to light.  Neck: Full passive range of motion without pain.  Cardiovascular: Normal rate, regular rhythm, normal heart sounds and normal pulses. Exam reveals no gallop and no friction rub.  No murmur heard. Pulses:      Radial pulses are 2+ on the right side, and 2+ on the left side.       Dorsalis pedis pulses are 2+ on the right side, and 2+ on the left side.  Pulmonary/Chest: Effort normal and breath sounds normal.  No evidence of respiratory distress. Able to speak in full sentences without difficulty.   Abdominal: Soft. Normal appearance. There is no tenderness. There is CVA tenderness (Right-sided). There is no rigidity and no guarding.   Musculoskeletal: Normal range of motion.       Arms: Diffuse muscular tenderness overlying the paraspinal muscles of the right lower lumbar region.  It does not extend into the midline.  No midline T, L-spine tenderness.  No deformities or crepitus noted.  Neurological: She is alert and oriented to person, place, and time. GCS eye subscore is 4. GCS verbal subscore is 5. GCS motor subscore is 6.  Cranial nerves III-XII intact Follows commands, Moves all extremities  5/5 strength to BUE and BLE  Sensation intact throughout all major nerve distributions Normal finger to nose. No dysdiadochokinesia. No pronator drift. No gait abnormalities  No slurred speech. No facial droop.   Skin: Skin is warm and dry. Capillary refill takes less than 2 seconds. No rash noted.  No rash noted.  Psychiatric: She has a normal mood and affect. Her speech is normal.  Nursing note and vitals reviewed.    ED Treatments / Results  Labs (all labs ordered are listed, but only abnormal results are displayed) Labs Reviewed  CBC WITH DIFFERENTIAL/PLATELET - Abnormal; Notable for the following components:      Result Value   WBC 15.3 (*)    Hemoglobin 16.6 (*)    HCT 47.2 (*)    Neutro Abs 10.9 (*)    All other components within normal limits  URINALYSIS, ROUTINE W REFLEX MICROSCOPIC - Abnormal; Notable for the following components:   Hgb urine dipstick SMALL (*)    All other components within normal limits  URINALYSIS, MICROSCOPIC (REFLEX) - Abnormal; Notable for the following components:   Bacteria, UA FEW (*)    Squamous Epithelial / LPF 0-5 (*)    All other components within normal limits  BASIC METABOLIC PANEL    EKG  EKG Interpretation None       Radiology Dg Ribs Unilateral W/chest Right  Result Date: 02/18/2017 CLINICAL DATA:  Rib pain EXAM: RIGHT RIBS AND CHEST - 3+ VIEW COMPARISON:  02/13/2017, 10/10/2016, 05/03/2016 FINDINGS: Single-view chest demonstrates apical pleural and  parenchymal scarring and nodularity, similar compared to prior. No acute consolidation, pleural effusion or pneumothorax is visualized. Normal heart size. Surgical clips in the right upper quadrant. Right rib series demonstrates no definite acute displaced right rib fracture IMPRESSION: 1. Stable pleural and parenchymal scarring at the apices. Negative for pneumothorax or pleural effusion 2. No definite acute displaced right rib fracture is seen Electronically Signed   By: Jasmine Pang M.D.   On: 02/18/2017 21:48   Ct Renal Stone Study  Result Date: 02/19/2017 CLINICAL DATA:  Intermittent right flank pain EXAM: CT ABDOMEN AND PELVIS WITHOUT CONTRAST TECHNIQUE: Multidetector CT imaging of the abdomen and pelvis was performed following the standard protocol without IV contrast. COMPARISON:  CT 02/13/2017, 05/08/2016 FINDINGS: Lower chest: No acute abnormality. Hepatobiliary: Subcentimeter hypodensity at the dome of the liver, no change. Status post cholecystectomy. No biliary dilatation. Pancreas: Unremarkable. No pancreatic ductal dilatation or surrounding inflammatory changes. Spleen: Normal in size without focal abnormality. Adrenals/Urinary Tract: Adrenal glands are unremarkable. Kidneys are normal, without renal calculi, focal lesion, or hydronephrosis. Bladder is unremarkable. Stomach/Bowel: Stomach is within normal limits. Appendix appears normal. No evidence of bowel wall thickening, distention, or inflammatory changes. Vascular/Lymphatic: No significant vascular findings are present. No enlarged abdominal or pelvic lymph nodes. Reproductive: Status post hysterectomy. No adnexal masses. Other: Negative for free air or free fluid. Small fat in the umbilical region Musculoskeletal: No acute or significant osseous findings. IMPRESSION: 1. No CT evidence for acute intra-abdominal or pelvic abnormality. 2. Negative for hydronephrosis or ureteral stone Electronically Signed   By: Jasmine Pang M.D.   On:  02/19/2017 00:39    Procedures Procedures (including critical care time)  Medications Ordered in ED Medications  cyclobenzaprine (FLEXERIL) tablet 5 mg (5 mg Oral Given 02/19/17 0146)     Initial Impression / Assessment and Plan / ED Course  I have reviewed the triage vital signs and the nursing notes.  Pertinent labs & imaging results that were available during my care of the patient were reviewed by me and considered in my medical decision making (see chart for details).     52 year old female who presents for evaluation of persistent right flank pain.  Recently diagnosed with pyelonephritis after being seen in the ED on 02/13/17.  Has been taking antibiotics as prescribed.  Also reporting some bilateral upper and lower extremity  numbness but states that this is chronic.  No new or changes in symptoms.  Has been able to ambulate.  No fever, chest pain, difficulty breathing, dysuria, hematuria. Patient is afebrile, non-toxic appearing, sitting comfortably on examination table. Vital signs reviewed and stable. No neuro deficits noted on exam.  Consider persistent pyelonephritis, kidney stone versus musculoskeletal pain.  Patient has pain that is worse with movement that is concerning for musculoskeletal pain.  History/physical exam is not concerning for ACS etiology, spinal abscess, cauda equina.  History/physical exam is not concerning for CVA A or GBS.  Labs ordered at triage. Analgesics provided in the department.   BMP is unremarkable.  CBC shows slight leukocytosis.  Review of patient records show that this is chronic.  She does have a slight bump in her white blood cell since her previous visit but otherwise unremarkable.  UA shows small amount of hemoglobin.  No leukocytes, infectious signs.  Right rib x-rays negative for any acute fracture or dislocation.  Given the patient does have some right-sided CVA tenderness, hemoglobin in her urine, evaluate for kidney stone with CT renal stone  study.  CT renal stone study reviewed.  Negative for any acute kidney stone.  No evidence of hydronephrosis, pyelonephritis.  Appendix is normal.  Discussed results with patient.  Patient able to ambulate in the department without difficulty.  Since patient's complaints of numbness have been chronic and she denies any new or change in symptoms, no further imaging is indicated.  History/physical exam is not concerning for GBS, CVA.  Patient does have an appointment scheduled with outpatient neurology.  Encouraged her to keep that appointment.  Patient stable for discharge at this time. Patient had ample opportunity for questions and discussion. All patient's questions were answered with full understanding. Strict return precautions discussed. Patient expresses understanding and agreement to plan.   Final Clinical Impressions(s) / ED Diagnoses   Final diagnoses:  Flank pain    ED Discharge Orders        Ordered    cyclobenzaprine (FLEXERIL) 10 MG tablet  2 times daily PRN     02/19/17 0213       Maxwell Caul, PA-C 02/19/17 0237    Tegeler, Canary Brim, MD 02/19/17 [phone]

## 2017-02-19 NOTE — ED Notes (Signed)
Pt ambulated in hallway with staff. Pt states her feet feel like they are very heavy. Pt walked like she had heavy boots on her feet.

## 2017-03-11 ENCOUNTER — Ambulatory Visit: Payer: 59 | Admitting: Neurology

## 2017-03-14 ENCOUNTER — Encounter: Payer: Self-pay | Admitting: Neurology

## 2017-03-14 ENCOUNTER — Telehealth: Payer: Self-pay | Admitting: Neurology

## 2017-03-14 ENCOUNTER — Ambulatory Visit: Payer: 59 | Admitting: Neurology

## 2017-03-14 VITALS — BP 106/84 | HR 93 | Ht 62.0 in | Wt 91.5 lb

## 2017-03-14 DIAGNOSIS — R9089 Other abnormal findings on diagnostic imaging of central nervous system: Secondary | ICD-10-CM

## 2017-03-14 DIAGNOSIS — G3281 Cerebellar ataxia in diseases classified elsewhere: Secondary | ICD-10-CM

## 2017-03-14 DIAGNOSIS — R269 Unspecified abnormalities of gait and mobility: Secondary | ICD-10-CM

## 2017-03-14 DIAGNOSIS — R202 Paresthesia of skin: Secondary | ICD-10-CM | POA: Diagnosis not present

## 2017-03-14 MED ORDER — ALPRAZOLAM 1 MG PO TABS: 1.0000 mg | ORAL_TABLET | ORAL | 0 refills | Status: DC | PRN

## 2017-03-14 MED ORDER — GABAPENTIN 300 MG PO CAPS: 300.0000 mg | ORAL_CAPSULE | Freq: Three times a day (TID) | ORAL | 11 refills | Status: DC

## 2017-03-14 NOTE — Telephone Encounter (Signed)
Patient scheduled for 03/28/17.

## 2017-03-14 NOTE — Progress Notes (Signed)
[name] DOB: 1965/04/01  Chief Complaint  Patient presents with  . Extremity Weakness    Reports extreme, bilateral leg weakness for three months.  More recently, she started having numbness/tingling and weakness in her hands.  She is using a cane to assist with ambulation.  [name] PCP    Georgina Quint, MD     HISTORICAL  Gabriela Campbell 52 year old female, seen in refer by her primary care doctor Georgina Quint, for evaluation of weakness, initial evaluation was on March 14, 2017.  I reviewed and summarized the referring note, she had a history of hysterectomy in 2003 cholecystectomy in 2099, does smoke half pack a day,  She was highly functional until June 2018, without clear triggers, she began to notice bilateral anterior thigh muscle achy pain, spasm, also noticed gradual onset gait abnormality, which has progressively worse over the past few months, recently she also noticed bilateral hands numbness tingling, clumsy of bilateral hand,  Initially she has mild low back pain, but no longer has significant low back pain, she denies neck pain, denies bowel bladder incontinence, she denies dysarthria, no diplopia no dysphagia.  I reviewed the laboratory evaluation in February 2019, CBC showed elevated WBC of 15.3, hemoglobin of 16.6, normal BMP, negative troponin, CPK was 25, CMP showed elevated alkaline phosphate 156, UA showed evidence of bacteria, normal TSH, negative HIV,   REVIEW OF SYSTEMS: Full 14 system review of systems performed and notable only for weight loss, feeling cold, achy muscles, numbness, weakness, dizziness, decreased energy, disinterested in activities.  ALLERGIES: Allergies  Allergen Reactions  . Dilaudid [Hydromorphone Hcl] Anaphylaxis  . Ibuprofen Anaphylaxis  . Iodine Anaphylaxis  . Penicillins Anaphylaxis    Has patient had a PCN reaction causing immediate rash, facial/tongue/throat swelling, SOB or lightheadedness with  hypotension:yes Has patient had a PCN reaction causing severe rash involving mucus membranes or skin necrosis: no Has patient had a PCN reaction that required hospitalization no Has patient had a PCN reaction occurring within the last 10 years: no If all of the above answers are "NO", then may proceed with Cephalosporin use.   . Tramadol Anaphylaxis  . Codeine Nausea Only    HOME MEDICATIONS: No current outpatient medications on file.   No current facility-administered medications for this visit.     PAST MEDICAL HISTORY: Past Medical History:  Diagnosis Date  . Shingles   . Weakness     PAST SURGICAL HISTORY: Past Surgical History:  Procedure Laterality Date  . ABDOMINAL HYSTERECTOMY    . CHOLECYSTECTOMY      FAMILY HISTORY: History reviewed. No pertinent family history.  SOCIAL HISTORY:  Social History   Socioeconomic History  . Marital status: Married    Spouse name: Not on file  . Number of children: 0  . Years of education: college  . Highest education level: Not on file  Social Needs  . Financial resource strain: Not on file  . Food insecurity - worry: Not on file  . Food insecurity - inability: Not on file  . Transportation needs - medical: Not on file  . Transportation needs - non-medical: Not on file  Occupational History  . Occupation: Unemployed  Tobacco Use  . Smoking status: Current Every Day Smoker    Packs/day: 0.50    Types: Cigarettes  . Smokeless tobacco: Never Used  Substance and Sexual Activity  . Alcohol use: No  . Drug use: No  . Sexual activity: Not on file  Other Topics Concern  . Not on file  Social History Narrative   Lives at home with husband.   Caffeine use:  1-2 cups daily.   Right-handed.     PHYSICAL EXAM   Vitals:   03/14/17 0756  BP: 106/84  Pulse: 93  Weight: 91 lb 8 oz (41.5 kg)  Height: 5\' 2"  (1.575 m)    Not recorded      Body mass index is 16.74 kg/m.  PHYSICAL EXAMNIATION:  Gen: NAD,  conversant, well nourised, obese, well groomed                     Cardiovascular: Regular rate rhythm, no peripheral edema, warm, nontender. Eyes: Conjunctivae clear without exudates or hemorrhage Neck: Supple, no carotid bruits. Pulmonary: Clear to auscultation bilaterally   NEUROLOGICAL EXAM:  MENTAL STATUS: Speech:    Speech is normal; fluent and spontaneous with normal comprehension.  Cognition:     Orientation to time, place and person     Normal recent and remote memory     Normal Attention span and concentration     Normal Language, naming, repeating,spontaneous speech     Fund of knowledge   CRANIAL NERVES: CN II: Visual fields are full to confrontation. Fundoscopic exam is normal with sharp discs and no vascular changes. Pupils are round equal and briskly reactive to light. CN III, IV, VI: extraocular movement are normal. No ptosis. CN V: Facial sensation is intact to pinprick in all 3 divisions bilaterally. Corneal responses are intact.  CN VII: Face is symmetric with normal eye closure and smile. CN VIII: Hearing is normal to rubbing fingers CN IX, X: Palate elevates symmetrically. Phonation is normal. CN XI: Head turning and shoulder shrug are intact CN XII: Tongue is midline with normal movements and no atrophy.  MOTOR: She has normal muscle tone, moderate bilateral hip flexion weakness, mild bilateral grip, finger abduction weakness.  REFLEXES: Reflexes are 3 and symmetric at the biceps, triceps, knees, and ankles. Plantar responses are extensor bilaterally  SENSORY: Intact to light touch, pinprick, positional sensation and vibratory sensation are intact in fingers and toes.  COORDINATION: Rapid alternating movements and fine finger movements are intact. There is no dysmetria on finger-to-nose and heel-knee-shin.    GAIT/STANCE: She can cross get up from seated position, but very unsteady, wide-based, cautious stiff gait,   DIAGNOSTIC DATA (LABS, IMAGING,  TESTING) - I reviewed patient records, labs, notes, testing and imaging myself where available.   ASSESSMENT AND PLAN  [name] is a 52 y.o. female    Subacute onset gait abnormality, hyperreflexia,  Potential localized to cervical cord,  Stat MRI of the cervical spine to rule out cervical spondylitic myelopathy,   Levert Feinstein, M.D. Ph.D.  [facility] [address] [place], [place] [address] Ph: [phone] Fax: [phone]  CC: Georgina Quint, MD

## 2017-03-14 NOTE — Telephone Encounter (Signed)
Was paged by radiology. MRI cervical spine, from [facility] so I don;t have access to the imaging. The impresson was read to me over the phone by a non medical person: No significant spinal canal stenosis, diffuse degenerative changes, focal lesion within the anterior aspect of the T2 vertebral body may represent a hemangioma. two foci of abnormal signal in the cord 1.1 x .5 x .5 cm at c2/c3, 2nd at c4-c5 .6x.5.x.3cm differential demyelination, inflammatory causes and infection or neoplasm MRi w/wo contrast recommended.

## 2017-03-14 NOTE — Telephone Encounter (Signed)
Please make sure she has an appt within 2 weeks,

## 2017-03-14 NOTE — Telephone Encounter (Signed)
Stat MRI Cervical spine is scheduled for today at 6:30 pm at [facility] because the patient needs an open MRI.  [employer] Auth: [id number] (exp. 03/14/17 to 04/28/17) order faxed to [facility]

## 2017-03-15 ENCOUNTER — Telehealth: Payer: Self-pay | Admitting: Neurology

## 2017-03-15 MED ORDER — ALPRAZOLAM 1 MG PO TABS: 1.0000 mg | ORAL_TABLET | ORAL | 0 refills | Status: DC | PRN

## 2017-03-15 NOTE — Telephone Encounter (Signed)
The MRI Brain is pending with [facility] I faxed clinical notes. The Cervical w contrast is approved: [id number] (exp. 03/15/17 to 04/29/17) & the thoracic is approved [id number] (exp. 03/15/17 to 04/29/17) once I get the Brain approved I will fax all the orders to [facility] since she needs an open MRI machine.

## 2017-03-15 NOTE — Telephone Encounter (Signed)
Please call patient, MRI of cervical spine showed significant abnormality, 2 foci of abnormal signal within the substance of the cervical spinal cord,  First was posterior to the C3-3 level, measuring 1.1 x 0.5 x 0.5 cm, at the dorsal aspect of the cord,  The second was at the C4-5 level measuring 0.5 x 0.6 x 0.3 cm in the left lateral aspect of the cord, There was associated mild thickening of the cervical cord,  Differentiation diagnosis include demyelinating disease, infectious, inflammatory etiology,  I have ordered MRI of cervical spine with contrast, MRI of the brain/thoracic with and without contrast,  Previous CBC on February 18, 2017 was elevated,  I also ordered repeat laboratory evaluation, she can come in without appointment,

## 2017-03-15 NOTE — Telephone Encounter (Signed)
Spoke to patient - she is aware of results.  She agreeable to the additional MRI scans and repeat labs.  States Xanax worked well for her to tolerate the first MRI.  Dr. Terrace Arabia has provided her with a new prescription for her pending tests and it has been faxed to her pharmacy.

## 2017-03-18 NOTE — Telephone Encounter (Signed)
I called [employer] to check the status of the MRI brain case and they informed me it is still pending.

## 2017-03-20 NOTE — Telephone Encounter (Signed)
The MRI Brain auth: [id number] (exp. 03/18/17 to 05/02/17) faxed order's to [facility] because of patient needs open mri.

## 2017-03-27 ENCOUNTER — Other Ambulatory Visit: Payer: Self-pay | Admitting: *Deleted

## 2017-03-27 ENCOUNTER — Other Ambulatory Visit: Payer: Self-pay | Admitting: Neurology

## 2017-03-27 ENCOUNTER — Other Ambulatory Visit (INDEPENDENT_AMBULATORY_CARE_PROVIDER_SITE_OTHER): Payer: 59

## 2017-03-27 DIAGNOSIS — R9089 Other abnormal findings on diagnostic imaging of central nervous system: Secondary | ICD-10-CM

## 2017-03-27 DIAGNOSIS — Z0289 Encounter for other administrative examinations: Secondary | ICD-10-CM

## 2017-03-27 NOTE — Addendum Note (Signed)
Addended by: Lilla Shook on: 03/27/2017 09:36 AM   Modules accepted: Orders

## 2017-03-27 NOTE — Addendum Note (Signed)
Addended by: Tamera Stands D on: 03/27/2017 05:32 PM   Modules accepted: Orders

## 2017-03-28 ENCOUNTER — Ambulatory Visit: Payer: 59 | Admitting: Neurology

## 2017-03-28 ENCOUNTER — Encounter: Payer: Self-pay | Admitting: Neurology

## 2017-03-28 VITALS — BP 132/87 | HR 65 | Ht 62.0 in | Wt 94.0 lb

## 2017-03-28 DIAGNOSIS — R269 Unspecified abnormalities of gait and mobility: Secondary | ICD-10-CM | POA: Diagnosis not present

## 2017-03-28 DIAGNOSIS — G35 Multiple sclerosis: Secondary | ICD-10-CM | POA: Diagnosis not present

## 2017-03-28 MED ORDER — MODAFINIL 100 MG PO TABS: 100.0000 mg | ORAL_TABLET | Freq: Every day | ORAL | 5 refills | Status: DC

## 2017-03-28 MED ORDER — DULOXETINE HCL 60 MG PO CPEP: 60.0000 mg | ORAL_CAPSULE | Freq: Every day | ORAL | 12 refills | Status: DC

## 2017-03-28 MED ORDER — PREDNISONE 10 MG PO TABS: 10.0000 mg | ORAL_TABLET | Freq: Every day | ORAL | 0 refills | Status: DC

## 2017-03-28 NOTE — Progress Notes (Signed)
[name] DOB: 04-Nov-1965  Chief Complaint  Patient presents with  . Gait Abnormality    She is here with her friend, Darel Hong, to review her test results.  She discontinued gabapentin due to intolerable GI upset.     HISTORICAL  Gabriela Campbell 52 year old female, seen in refer by her primary care doctor Georgina Quint, for evaluation of weakness, initial evaluation was on March 14, 2017.  I reviewed and summarized the referring note, she had a history of hysterectomy in 2003 cholecystectomy in 2099, does smoke half pack a day,  She was highly functional until June 2018, without clear triggers, she began to notice bilateral anterior thigh muscle achy pain, spasm, also noticed gradual onset gait abnormality, which has progressively worse over the past few months, recently she also noticed bilateral hands numbness tingling, clumsy of bilateral hand,  Initially she has mild low back pain, but no longer has significant low back pain, she denies neck pain, denies bowel bladder incontinence, she denies dysarthria, no diplopia no dysphagia.  I reviewed the laboratory evaluation in February 2019, CBC showed elevated WBC of 15.3, hemoglobin of 16.6, normal BMP, negative troponin, CPK was 25, CMP showed elevated alkaline phosphate 156, UA showed evidence of bacteria, normal TSH, negative HIV,  UPDATE March 28 2017: Patient is accompanied by her friend at today's clinical visit, she continue complains of slow decline gait abnormality, paresthesia achy pain of bilateral lower extremity.  MRI of cervical spine with without contrast at [facility] in March 2019 showed evidence of signal abnormality behind C3 and 4, with mild enhancement,  MRI of brain showed evidence of multiple area of abnormal T2/FLAIR hyperintensity signal in the white matter at both hemisphere predominantly periventricular, there was also T1 black holes, involving the lower pons, medulla, deep left  cerebellar white matter, faint enhancement of at least one lesion in the left frontal lobe, unusual bulbous appearing of the anterior, inferior third ventricle in the region of the infundibular recess, with partial ringlike enhancement, hyper intense signal in the hypothalamus, right greater than left, possible he also optic asthma, right greater than left,  MRI of thoracic spine showed abnormal signal behind C7 T1-T2, also behind T6, T7, T8, some enhancement of the upper cord abnormality.  Imaging findings along with her history most consistent with relapsing remitting multiple sclerosis, and then all antibodies was negative,  Laboratory evaluations in March 2019, vitamin D level was decreased 24, normal or negative NMO antibody,Lyme titer, ANA, copper, ferritin level was mildly elevated 183, CPK, TSH, C-reactive protein, folic acid, A1c was 5.5, HIV, B12, RPR,  We had discussed extensive treatment options, with her aggressive clinical course, gait abnormality, significant abnormality on MRI of brain and spinal cord, will proceed with IV infusion, offered her the option of Tysarbri versus ocrelizumab  She also complains of significant fatigue,   REVIEW OF SYSTEMS: Full 14 system review of systems performed and notable only for weight loss, feeling cold, achy muscles, numbness, weakness, dizziness, decreased energy, disinterested in activities.  ALLERGIES: Allergies  Allergen Reactions  . Dilaudid [Hydromorphone Hcl] Anaphylaxis  . Ibuprofen Anaphylaxis  . Iodine Anaphylaxis  . Penicillins Anaphylaxis    Has patient had a PCN reaction causing immediate rash, facial/tongue/throat swelling, SOB or lightheadedness with hypotension:yes Has patient had a PCN reaction causing severe rash involving mucus membranes or skin necrosis: no Has patient had a PCN reaction that required hospitalization no Has patient had a PCN reaction occurring within the  last 10 years: no If all of the above answers are  "NO", then may proceed with Cephalosporin use.   . Tramadol Anaphylaxis  . Gabapentin Other (See Comments)    GI upset  . Codeine Nausea Only    HOME MEDICATIONS: No current outpatient medications on file.   No current facility-administered medications for this visit.     PAST MEDICAL HISTORY: Past Medical History:  Diagnosis Date  . Shingles   . Weakness     PAST SURGICAL HISTORY: Past Surgical History:  Procedure Laterality Date  . ABDOMINAL HYSTERECTOMY    . CHOLECYSTECTOMY      FAMILY HISTORY: History reviewed. No pertinent family history.  SOCIAL HISTORY:  Social History   Socioeconomic History  . Marital status: Married    Spouse name: Not on file  . Number of children: 0  . Years of education: college  . Highest education level: Not on file  Occupational History  . Occupation: Unemployed  Social Needs  . Financial resource strain: Not on file  . Food insecurity:    Worry: Not on file    Inability: Not on file  . Transportation needs:    Medical: Not on file    Non-medical: Not on file  Tobacco Use  . Smoking status: Current Every Day Smoker    Packs/day: 0.50    Types: Cigarettes  . Smokeless tobacco: Never Used  Substance and Sexual Activity  . Alcohol use: No  . Drug use: No  . Sexual activity: Not on file  Lifestyle  . Physical activity:    Days per week: Not on file    Minutes per session: Not on file  . Stress: Not on file  Relationships  . Social connections:    Talks on phone: Not on file    Gets together: Not on file    Attends religious service: Not on file    Active member of club or organization: Not on file    Attends meetings of clubs or organizations: Not on file    Relationship status: Not on file  . Intimate partner violence:    Fear of current or ex partner: Not on file    Emotionally abused: Not on file    Physically abused: Not on file    Forced sexual activity: Not on file  Other Topics Concern  . Not on file    Social History Narrative   Lives at home with husband.   Caffeine use:  1-2 cups daily.   Right-handed.     PHYSICAL EXAM   Vitals:   03/28/17 1306  BP: 132/87  Pulse: 65  Weight: 94 lb (42.6 kg)  Height: 5\' 2"  (1.575 m)    Not recorded      Body mass index is 17.19 kg/m.  PHYSICAL EXAMNIATION:  Gen: NAD, conversant, well nourised, obese, well groomed                     Cardiovascular: Regular rate rhythm, no peripheral edema, warm, nontender. Eyes: Conjunctivae clear without exudates or hemorrhage Neck: Supple, no carotid bruits. Pulmonary: Clear to auscultation bilaterally   NEUROLOGICAL EXAM:  MENTAL STATUS: Speech:    Speech is normal; fluent and spontaneous with normal comprehension.  Cognition:     Orientation to time, place and person     Normal recent and remote memory     Normal Attention span and concentration     Normal Language, naming, repeating,spontaneous speech     [employer]  of knowledge   CRANIAL NERVES: CN II: Visual fields are full to confrontation. Fundoscopic exam is normal with sharp discs and no vascular changes. Pupils are round equal and briskly reactive to light. CN III, IV, VI: extraocular movement are normal. No ptosis. CN V: Facial sensation is intact to pinprick in all 3 divisions bilaterally. Corneal responses are intact.  CN VII: Face is symmetric with normal eye closure and smile. CN VIII: Hearing is normal to rubbing fingers CN IX, X: Palate elevates symmetrically. Phonation is normal. CN XI: Head turning and shoulder shrug are intact CN XII: Tongue is midline with normal movements and no atrophy.  MOTOR: She has normal muscle tone, moderate bilateral hip flexion weakness, mild bilateral grip, finger abduction weakness.  REFLEXES: Reflexes are 3 and symmetric at the biceps, triceps, knees, and ankles. Plantar responses are extensor bilaterally  SENSORY: Intact to light touch, pinprick, positional sensation and vibratory  sensation are intact in fingers and toes.  COORDINATION: Rapid alternating movements and fine finger movements are intact. There is no dysmetria on finger-to-nose and heel-knee-shin.    GAIT/STANCE: She can cross get up from seated position, but very unsteady, wide-based, cautious stiff gait,   DIAGNOSTIC DATA (LABS, IMAGING, TESTING) - I reviewed patient records, labs, notes, testing and imaging myself where available.   ASSESSMENT AND PLAN  Gabriela Campbell is a 52 y.o. female   Relapsing remitting multiple sclerosis  Laboratory evaluations including VZV, JC virus titer,  I have offered her the option of Tysabri versus ocrelizumab, will depend on the JC virus titer  IV steroid treatment followed by p.o. prednisone tapering   Home physical therapy  Lumbar puncture   Levert Feinstein, M.D. Ph.D.  [facility] [address] [place], [place] [address] Ph: [phone] Fax: [phone]  CC: Georgina Quint, MD

## 2017-03-28 NOTE — Patient Instructions (Addendum)
Tysabri IV infusion once a month  Ocrelizumab IV infusion once every 6 months,  Choice of medication depend on laboratory evaluation today,  Start Cymbalta 60 mg daily for diffuse body achy pain   after 1-2 weeks, start Provigil 100 mg every morning for fatigue  I have arranged home health  Physical therapy, occupational therapy, IV nurse for Solu-Medrol IV infusion 1000 mg daily for 3 days, following IV infusion, usually started prednisone p.o. Tapering dose

## 2017-03-29 ENCOUNTER — Telehealth: Payer: Self-pay | Admitting: *Deleted

## 2017-03-29 ENCOUNTER — Telehealth: Payer: Self-pay | Admitting: Neurology

## 2017-03-29 LAB — PROTEIN ELECTROPHORESIS, SERUM
A/G Ratio: 1.3 (ref 0.7–1.7)
ALBUMIN ELP: 3.5 g/dL (ref 2.9–4.4)
ALPHA 2: 0.8 g/dL (ref 0.4–1.0)
Alpha 1: 0.2 g/dL (ref 0.0–0.4)
BETA: 1.2 g/dL (ref 0.7–1.3)
Gamma Globulin: 0.7 g/dL (ref 0.4–1.8)
Globulin, Total: 2.8 g/dL (ref 2.2–3.9)
Total Protein: 6.3 g/dL (ref 6.0–8.5)

## 2017-03-29 LAB — IRON AND TIBC
IRON SATURATION: 42 % (ref 15–55)
IRON: 109 ug/dL (ref 27–159)
Total Iron Binding Capacity: 258 ug/dL (ref 250–450)
UIBC: 149 ug/dL (ref 131–425)

## 2017-03-29 LAB — FOLATE: Folate: 9.3 ng/mL (ref >3.0)

## 2017-03-29 LAB — TSH: TSH: 0.827 u[IU]/mL (ref 0.450–4.500)

## 2017-03-29 LAB — C-REACTIVE PROTEIN: CRP: 0.5 mg/L (ref 0.0–4.9)

## 2017-03-29 LAB — HEMOGLOBIN A1C
Est. average glucose Bld gHb Est-mCnc: 111 mg/dL
Hgb A1c MFr Bld: 5.5 % (ref 4.8–5.6)

## 2017-03-29 LAB — RPR: RPR Ser Ql: NONREACTIVE

## 2017-03-29 LAB — NEUROMYELITIS OPTICA AUTOAB, IGG: NMO IgG Autoantibodies: <1.5 U/mL (ref 0.0–3.0)

## 2017-03-29 LAB — VITAMIN D 25 HYDROXY (VIT D DEFICIENCY, FRACTURES): VIT D 25 HYDROXY: 23.9 ng/mL — AB (ref 30.0–100.0)

## 2017-03-29 LAB — HIV ANTIBODY (ROUTINE TESTING W REFLEX): HIV SCREEN 4TH GENERATION: NONREACTIVE

## 2017-03-29 LAB — ANA W/REFLEX IF POSITIVE: ANA: NEGATIVE

## 2017-03-29 LAB — B. BURGDORFI ANTIBODIES: Lyme IgG/IgM Ab: <0.91 {ISR} (ref 0.00–0.90)

## 2017-03-29 LAB — COPPER, SERUM: COPPER: 94 ug/dL (ref 72–166)

## 2017-03-29 LAB — FERRITIN: Ferritin: 183 ng/mL — ABNORMAL HIGH (ref 15–150)

## 2017-03-29 LAB — CK: Total CK: 40 U/L (ref 24–173)

## 2017-03-29 LAB — VITAMIN B12: Vitamin B-12: 633 pg/mL (ref 232–1245)

## 2017-03-29 NOTE — Telephone Encounter (Addendum)
Pt called requesting a call back to know what MG of vit. D Dr. Terrace Arabia would like her to be on.

## 2017-03-29 NOTE — Telephone Encounter (Signed)
I spoke with Gabriela Campbell and advised she may take otc Vit. D 5,000iu daily.  She verbalized understanding of same/fim

## 2017-03-29 NOTE — Telephone Encounter (Signed)
I called [facility] requesting pt Cd.

## 2017-03-29 NOTE — Telephone Encounter (Signed)
Please get MRI CD from [facility] for me to review

## 2017-04-01 ENCOUNTER — Telehealth: Payer: Self-pay | Admitting: *Deleted

## 2017-04-01 NOTE — Telephone Encounter (Signed)
PA for modafinil approved by [employer] [phone]) through 04/02/2018.  GN#[id number].

## 2017-04-02 ENCOUNTER — Telehealth: Payer: Self-pay | Admitting: *Deleted

## 2017-04-02 LAB — QUANTIFERON-TB GOLD PLUS
QUANTIFERON NIL VALUE: 0.01 [IU]/mL
QUANTIFERON TB2 AG VALUE: 0.01 [IU]/mL
QUANTIFERON-TB GOLD PLUS: NEGATIVE
QuantiFERON Mitogen Value: >10 IU/mL
QuantiFERON TB1 Ag Value: 0.01 IU/mL

## 2017-04-02 LAB — HEPATITIS B SURFACE ANTIBODY,QUALITATIVE: Hep B Surface Ab, Qual: NONREACTIVE

## 2017-04-02 LAB — HEPATITIS B CORE ANTIBODY, TOTAL: HEP B C TOTAL AB: NEGATIVE

## 2017-04-02 LAB — HEPATITIS B SURFACE ANTIGEN: Hepatitis B Surface Ag: NEGATIVE

## 2017-04-02 LAB — VARICELLA ZOSTER ANTIBODY, IGG: VARICELLA: 1228 {index} (ref >165)

## 2017-04-02 LAB — HEPATITIS C ANTIBODY

## 2017-04-02 MED ORDER — NORTRIPTYLINE HCL 25 MG PO CAPS: ORAL_CAPSULE | ORAL | 5 refills | Status: DC

## 2017-04-02 NOTE — Telephone Encounter (Signed)
Left patient a detailed message, with results, on voicemail (ok per DPR).  Provided our number to call back with any questions.  

## 2017-04-02 NOTE — Telephone Encounter (Signed)
-----   Message from Levert Feinstein, MD sent at 04/02/2017 10:16 AM EDT ----- Please call patient for normal laboratory result

## 2017-04-02 NOTE — Telephone Encounter (Signed)
Gabriela Campbell is having anxiety and difficulty sleeping due to her newly diagnosed MS.  Per vo by Dr. Terrace Arabia, provide her with a prescription for nortriptyline 25mg  at bedtime for one week then increase to 50mg  at bedtime thereafter.  Pt is agreeable to this plan.  New rx has been sent to pharmacy.

## 2017-04-05 ENCOUNTER — Inpatient Hospital Stay
Admission: RE | Admit: 2017-04-05 | Discharge: 2017-04-05 | Disposition: A | Discharge status: Home / Self Care | Payer: 59 | Source: Ambulatory Visit | Attending: Neurology | Admitting: Neurology

## 2017-04-05 ENCOUNTER — Telehealth: Payer: Self-pay | Admitting: *Deleted

## 2017-04-05 NOTE — Telephone Encounter (Addendum)
Labs collected 03/28/17:  JCV ab 0.29 indeterminate w/ inhibition assay negative.  Per vo by Dr. Terrace Arabia, start patient on Tysabri.  Patient agreeable to this treatment and has signed start form.

## 2017-04-08 ENCOUNTER — Other Ambulatory Visit: Payer: Self-pay | Admitting: *Deleted

## 2017-04-08 ENCOUNTER — Encounter: Payer: Self-pay | Admitting: *Deleted

## 2017-04-08 ENCOUNTER — Telehealth: Payer: Self-pay | Admitting: Neurology

## 2017-04-08 MED ORDER — PREGABALIN 50 MG PO CAPS: 50.0000 mg | ORAL_CAPSULE | Freq: Three times a day (TID) | ORAL | 11 refills | Status: DC

## 2017-04-08 MED ORDER — GABAPENTIN 300 MG PO CAPS: 300.0000 mg | ORAL_CAPSULE | Freq: Three times a day (TID) | ORAL | 11 refills | Status: DC

## 2017-04-08 NOTE — Telephone Encounter (Addendum)
Spoke to patient - she is aware Tysabri will have to be approved by her insurance then [facility] will call her to schedule.  Her vitamin D level was low (23.9) - she has purchased and start vitamin D3 2000 units daily.  Her legs are still painful, stiff and numb despite taking duloxetine 60mg  daily.  Per vo by Dr. Terrace Arabia, add Lyrica 50mg , one capsule TID (gabapentin caused GI upset in the past).    She is agreeable to try Lyrica and it has been faxed to her pharmacy.

## 2017-04-08 NOTE — Addendum Note (Signed)
Addended by: Lilla Shook on: 04/08/2017 05:27 PM   Modules accepted: Orders

## 2017-04-08 NOTE — Telephone Encounter (Signed)
Tysabri start form signed by Dr. Terrace Arabia and provided to [facility].  They will get the medication approved with insurance then call to schedule her first infusion.

## 2017-04-08 NOTE — Addendum Note (Signed)
Addended by: Lindell Spar C on: 04/08/2017 04:46 PM   Modules accepted: Orders

## 2017-04-08 NOTE — Telephone Encounter (Signed)
Left message requesting a return call.

## 2017-04-08 NOTE — Telephone Encounter (Signed)
Please call patient, MRI of the brain with and without contrast on March 28, 2027 showed findings consistent is partially ringlike enhancement in the hypothalamus, right greater than left, also involving optic chiasm,left frontal lobe faint enhancement,  MRI of cervical spine showed multiple lesions, levels, mild enhancing at C3-C4 level  MRI of thoracic spine with and without contrast showed abnormal signal behind C7, T1-T2 T6, T7-T8, some enhancement of upper cord abnormality.

## 2017-04-08 NOTE — Telephone Encounter (Signed)
Pt called she has questions reg tysabri. Please call to advise at [phone]

## 2017-04-10 ENCOUNTER — Telehealth: Payer: Self-pay | Admitting: *Deleted

## 2017-04-10 NOTE — Telephone Encounter (Signed)
Lyrica PA approved by [employer] [phone]) through 04/11/2018.  Pt [id number].  LG#[id number].

## 2017-04-16 ENCOUNTER — Telehealth: Payer: Self-pay | Admitting: *Deleted

## 2017-04-16 ENCOUNTER — Ambulatory Visit
Admission: RE | Admit: 2017-04-16 | Discharge: 2017-04-16 | Disposition: A | Discharge status: Home / Self Care | Payer: 59 | Source: Ambulatory Visit | Attending: Neurology | Admitting: Neurology

## 2017-04-16 VITALS — BP 129/69 | HR 57

## 2017-04-16 DIAGNOSIS — G35 Multiple sclerosis: Secondary | ICD-10-CM

## 2017-04-16 DIAGNOSIS — R269 Unspecified abnormalities of gait and mobility: Secondary | ICD-10-CM

## 2017-04-16 DIAGNOSIS — R29898 Other symptoms and signs involving the musculoskeletal system: Secondary | ICD-10-CM

## 2017-04-16 DIAGNOSIS — R202 Paresthesia of skin: Secondary | ICD-10-CM

## 2017-04-16 IMAGING — XA DG FLUORO GUIDE LUMBAR PUNCTURE
2 series · 2 of 2 positions shown · non-contrast
Comparison: none

CLINICAL DATA: Multiple sclerosis.

[Series 1: ortho standard · 1 of 1 slices shown (1 of 2)]
[im 1/1]
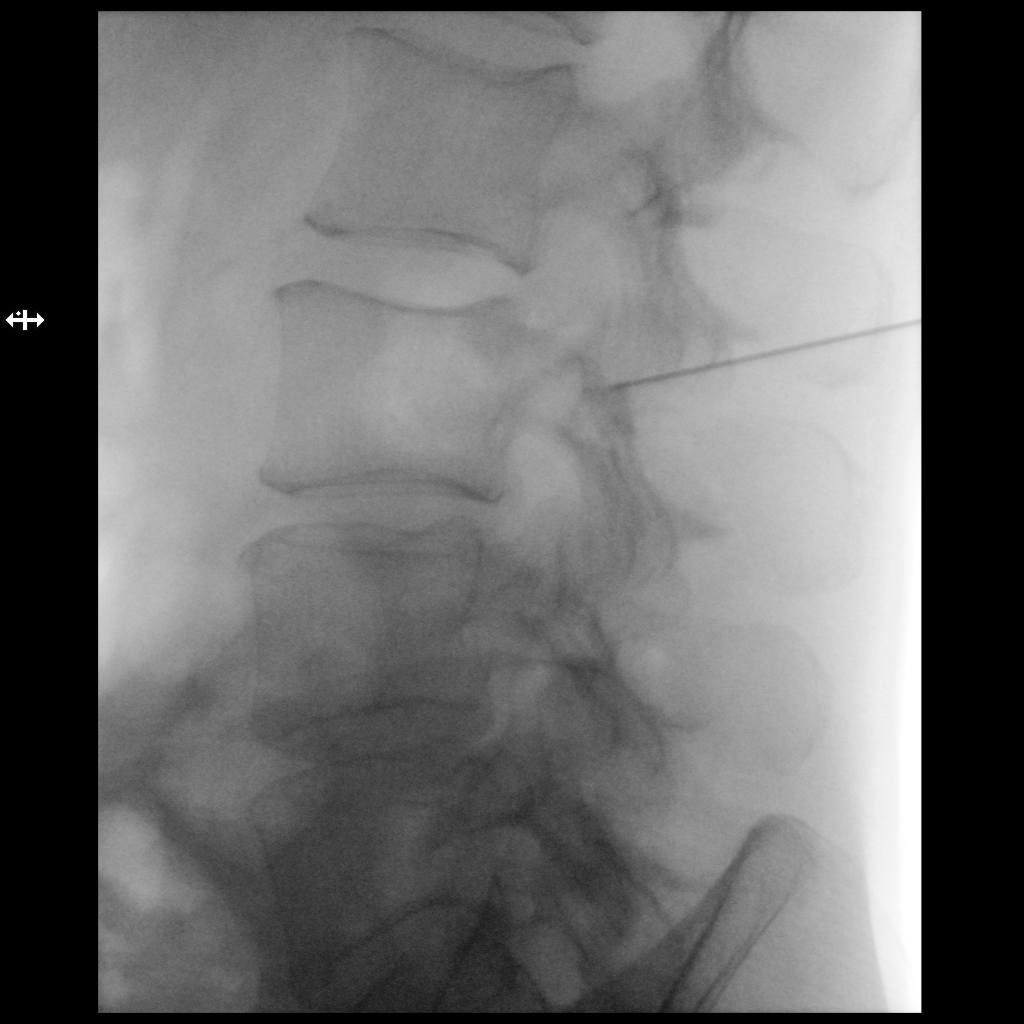

[Series 3: ortho standard · 1 of 1 slices shown (2 of 2)]
[im 1/1]
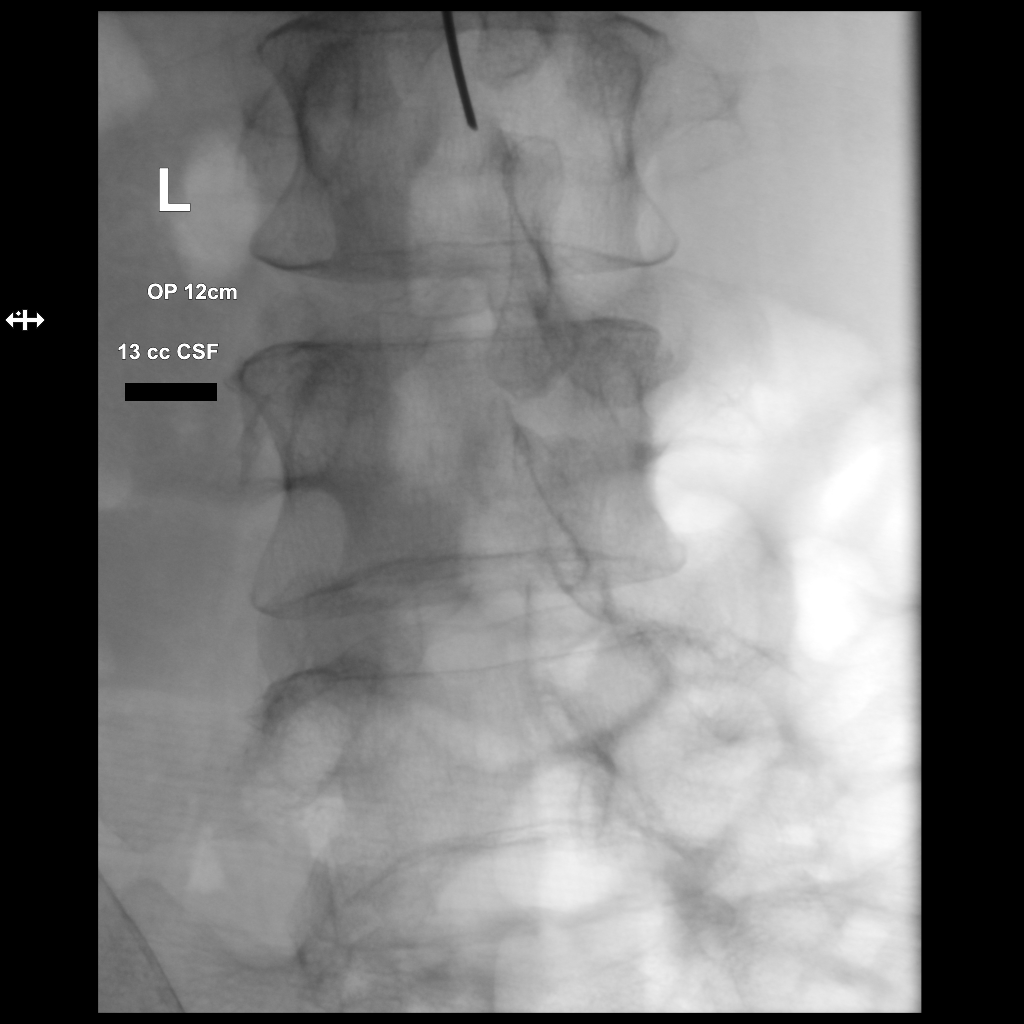

[2 of 2 positions shown; findings below may reference images not displayed]

EXAM:
DIAGNOSTIC LUMBAR PUNCTURE UNDER FLUOROSCOPIC GUIDANCE

FLUOROSCOPY TIME:  11 seconds corresponding to a Dose Area Product
of 27.69 Gy*m2

PROCEDURE:
Informed consent was obtained from the patient prior to the
procedure, including potential complications of headache, allergy,
and pain. Time-out was performed.

With the patient lateral decubitus the lower back was prepped with
Betadine. 1% Lidocaine was used for local anesthesia. Lumbar
puncture was performed at the L2-L3 level using a 20 gauge needle
with return of clear CSF with an opening pressure of 12 cm water.
13.0 ml of CSF were obtained for laboratory studies. The patient
tolerated the procedure well and there were no apparent
complications. Instructions were given for strict bed rest for the
next 24 hours.
IMPRESSION: Technically successful diagnostic lumbar puncture. Clear colorless
fluid is obtained with a normal opening pressure.

## 2017-04-16 NOTE — Progress Notes (Signed)
Labs obtained from [facility] for lumbar puncture labs. Pt tolerated procedure well. Site unremarkable.

## 2017-04-16 NOTE — Discharge Instructions (Signed)

## 2017-04-16 NOTE — Telephone Encounter (Signed)
Glucose, CSF--73 Protein, Total, CSF--31 Cell Count and Diff, CSF Color--Colorless Appearance--Clear Red Blood Cell Count--0 White Blood Cell Count--2 Comments: Lymphocytes noted on slide.                       Performed on tube 3. Test Not Performed:    Neutrophils   Lymphocytes   Monocytes/Macrophage   Basophils  Pending tests: MS Panel 2, VDRL, CSF, Gram Stain, Culture, Fungus With Smear.

## 2017-04-17 ENCOUNTER — Telehealth: Payer: Self-pay | Admitting: Neurology

## 2017-04-17 ENCOUNTER — Encounter: Payer: Self-pay | Admitting: *Deleted

## 2017-04-17 NOTE — Telephone Encounter (Signed)
Pt has a question about a supplement she would like RN Marcelino Duster to call her back about

## 2017-04-17 NOTE — Telephone Encounter (Signed)
She is planning to try OTC Prevagen.  The supplement will be added to her medication list.

## 2017-04-25 ENCOUNTER — Telehealth: Payer: Self-pay | Admitting: Neurology

## 2017-04-25 NOTE — Telephone Encounter (Signed)
Spoke to patient - states [facility] told her they did not get her modafanil prescription.  I called [facility] and spoke to [place].  She confirmed they do have the prescription but had placed it on file.  She will get it ready for pick up by 5pm today.  I called the patient back with this update.

## 2017-04-25 NOTE — Telephone Encounter (Signed)
Pt is asking for a call from RN Marcelino Duster to discuss her modafinil (PROVIGIL) 100 MG tablet

## 2017-05-15 LAB — GRAM STAIN
MICRO NUMBER:: 90466556
SPECIMEN QUALITY:: ADEQUATE

## 2017-05-15 LAB — FUNGUS CULTURE W SMEAR
MICRO NUMBER:: 90466557
SMEAR:: NONE SEEN
SPECIMEN QUALITY:: ADEQUATE

## 2017-05-15 LAB — CSF CELL COUNT WITH DIFFERENTIAL
RBC COUNT CSF: 0 {cells}/uL (ref 0–10)
WBC, CSF: 2 cells/uL (ref 0–5)

## 2017-05-15 LAB — GLUCOSE, CSF: Glucose, CSF: 73 mg/dL (ref 40–80)

## 2017-05-15 LAB — MULTIPLE SCLEROSIS PANEL 2
ALBUMIN SERUM: 4.2 g/dL (ref 3.5–5.2)
Albumin, CSF: 11.4 mg/dL (ref 8.0–42.0)
CNS-IgG Synthesis Rate: 3 mg/24 h (ref <=3.3)
IGG (IMMUNOGLOBIN G), SERUM: 701 mg/dL (ref 694–1618)
IgG Total CSF: 2 mg/dL (ref 0.8–7.7)
IgG-Index: 1.05 — ABNORMAL HIGH (ref <0.66)

## 2017-05-15 LAB — VDRL, CSF: VDRL Quant, CSF: NONREACTIVE

## 2017-05-15 LAB — PROTEIN, CSF: TOTAL PROTEIN, CSF: 31 mg/dL (ref 15–45)

## 2017-05-16 ENCOUNTER — Telehealth: Payer: Self-pay | Admitting: Neurology

## 2017-05-16 ENCOUNTER — Telehealth: Payer: Self-pay

## 2017-05-16 NOTE — Telephone Encounter (Signed)
Patient got her first infusion of Tysarbri on 05/06/17. She states that the symptoms started about a week ago and has been constant. She denies any recent falls, she mentions that the last time she fell was about 2 months ago.

## 2017-05-16 NOTE — Telephone Encounter (Signed)
Printed results put on Dr. Zannie Cove desk to review.   MS Panel 2: < 2.0 VDRL: Nonreactive Gram Stain: No white blood cells seen, No organisms seen.  Culture, Fungus w/ smear: No fungal elements seen, No fungus isolated.   IGG Index, CSF: 1.05 (out of range) Oligoclinal Bands Noted (see "notes" on hard copy)

## 2017-05-16 NOTE — Telephone Encounter (Signed)
Dr. Terrace Arabia, do you have any recommendations or should patient be seen to be evaluated?

## 2017-05-16 NOTE — Telephone Encounter (Signed)
Pt called said the thoracic spine area up to the neck is very tight sensation and constant pain since last Thursday. She has taken tylenol with no relief. She has tried ice with no relief and is now using a heating pad with no relief either. Please call to advise.

## 2017-05-16 NOTE — Telephone Encounter (Signed)
Patient has relapsing remitting multiple sclerosis, with widespread lesions involving bilateral hemisphere, spinal cord, and gait abnormality,  She should start enteral Tysarbri infusion, please find out her infusion schedule,  Has she fall for her to have sudden worsening low back pain?

## 2017-05-17 ENCOUNTER — Other Ambulatory Visit: Payer: Self-pay | Admitting: *Deleted

## 2017-05-17 MED ORDER — PREGABALIN 50 MG PO CAPS: 100.0000 mg | ORAL_CAPSULE | Freq: Three times a day (TID) | ORAL | 5 refills | Status: DC

## 2017-05-17 NOTE — Telephone Encounter (Signed)
She may take higher dose of Lyrica  100 mg 3 times a day, if 50 mg 3 times a day provide some help,  The other options are  1.higher dose of nortriptyline 25 mg up to 4 tablets every night to help her sleep, 2.  Muscle relaxant Flexeril 10 mg 3 times a day as needed 3.  Try above measures first, for 1 to 2 weeks, if she can remain symptomatic, may consider evaluation, steroid package

## 2017-05-17 NOTE — Telephone Encounter (Signed)
Spoke to the patient - she would like to try the increase in Lyrica to start.  She will call us back if this is not helpful.  New rx faxed and confirmed to pharmacy.

## 2017-05-22 ENCOUNTER — Telehealth: Payer: Self-pay | Admitting: *Deleted

## 2017-05-22 NOTE — Telephone Encounter (Signed)
Lyrica PA started, via phone, with [employer] ([phone]) - decision pending. Pt [id number].  DGL#[id number].

## 2017-05-23 NOTE — Telephone Encounter (Signed)
Called [employer] to check the status of this PA.  The decision is still pending.

## 2017-05-29 MED ORDER — PREGABALIN 100 MG PO CAPS: 100.0000 mg | ORAL_CAPSULE | Freq: Three times a day (TID) | ORAL | 5 refills | Status: DC

## 2017-05-29 NOTE — Telephone Encounter (Signed)
She had a prescription for Lyrica 50mg , 2 capsules TID that required a PA.  We received a denial of coverage from her insurance stating this is a quantity issue. This prescriptions will be voided and replaced with Lyrica 100mg , 1 capsule TID so that insurance will cover the cost of her medication.

## 2017-05-29 NOTE — Addendum Note (Signed)
Addended by: Lilla Shook on: 05/29/2017 08:41 AM   Modules accepted: Orders

## 2017-06-04 ENCOUNTER — Other Ambulatory Visit: Payer: Self-pay | Admitting: *Deleted

## 2017-06-04 MED ORDER — NORTRIPTYLINE HCL 25 MG PO CAPS: ORAL_CAPSULE | ORAL | 5 refills | Status: DC

## 2017-06-04 NOTE — Telephone Encounter (Signed)
Pt requesting a call to discuss something she got in the mail in regards to Lyrica, also stating the medication hasn't been working for her. Pt did not want to go into further detail with me, please call to advise

## 2017-06-04 NOTE — Telephone Encounter (Signed)
Previous orders from Dr. Terrace Arabia on 05/16/17:  She may take higher dose of Lyrica  100 mg 3 times a day, if 50 mg 3 times a day provide some help.  The other options are: 1.  Higher dose of nortriptyline 25 mg up to 4 tablets every night to help her sleep 2.  Muscle relaxant Flexeril 10 mg 3 times a day as needed 3.  Try above measures first, for 1 to 2 weeks, if she can remain symptomatic, may consider evaluation, steroid package

## 2017-06-04 NOTE — Telephone Encounter (Signed)
Discussed plan with Dr. Terrace Arabia - per her vo:  1) continue Lyrica 100mg  TID 2) increase nortriptyline - currently taking 50mg  qhs, take 75mg  qhs x 1 week, then increase to 100mg  qhs thereafter (new rx sent to pharmacy)  Pt agreeable to this plan and will call us if symptoms do not improve.

## 2017-07-18 ENCOUNTER — Ambulatory Visit: Payer: 59 | Admitting: Neurology

## 2017-08-06 ENCOUNTER — Encounter: Payer: Self-pay | Admitting: Neurology

## 2017-08-06 ENCOUNTER — Other Ambulatory Visit: Payer: Self-pay | Admitting: *Deleted

## 2017-08-06 ENCOUNTER — Ambulatory Visit: Payer: 59 | Admitting: Neurology

## 2017-08-06 VITALS — BP 164/97 | HR 71 | Ht 62.0 in | Wt 97.8 lb

## 2017-08-06 DIAGNOSIS — R269 Unspecified abnormalities of gait and mobility: Secondary | ICD-10-CM | POA: Diagnosis not present

## 2017-08-06 DIAGNOSIS — G35 Multiple sclerosis: Secondary | ICD-10-CM

## 2017-08-06 DIAGNOSIS — R899 Unspecified abnormal finding in specimens from other organs, systems and tissues: Secondary | ICD-10-CM

## 2017-08-06 NOTE — Progress Notes (Signed)
[name] DOB: July 29, 1965  Chief Complaint  Patient presents with  . Multiple Sclerosis    Feels she is doing well on Tysabri. No new concerns. She would like to review her MRI results today.  She continues to have a slow, unsteady gait.  She uses a cane for assistance. Other than Tysabri, she is now only taking modafinil.     HISTORICAL  Gabriela Campbell 52 year old female, seen in refer by her primary care doctor Georgina Quint, for evaluation of weakness, initial evaluation was on March 14, 2017.  I reviewed and summarized the referring note, she had a history of hysterectomy in 2003 cholecystectomy in 2099, does smoke half pack a day,  She was highly functional until June 2018, without clear triggers, she began to notice bilateral anterior thigh muscle achy pain, spasm, also noticed gradual onset gait abnormality, which has progressively worse over the past few months, recently she also noticed bilateral hands numbness tingling, clumsy of bilateral hand,  Initially she has mild low back pain, but no longer has significant low back pain, she denies neck pain, denies bowel bladder incontinence, she denies dysarthria, no diplopia no dysphagia.  I reviewed the laboratory evaluation in February 2019, CBC showed elevated WBC of 15.3, hemoglobin of 16.6, normal BMP, negative troponin, CPK was 25, CMP showed elevated alkaline phosphate 156, UA showed evidence of bacteria, normal TSH, negative HIV,  UPDATE March 28 2017: Patient is accompanied by her friend at today's clinical visit, she continue complains of slow decline gait abnormality, paresthesia achy pain of bilateral lower extremity.  MRI of cervical spine with without contrast at [facility] in March 2019 showed evidence of signal abnormality behind C3 and 4, with mild enhancement,  MRI of brain showed evidence of multiple area of abnormal T2/FLAIR hyperintensity signal in the white matter at both  hemisphere predominantly periventricular, there was also T1 black holes, involving the lower pons, medulla, deep left cerebellar white matter, faint enhancement of at least one lesion in the left frontal lobe, unusual bulbous appearing of the anterior, inferior third ventricle in the region of the infundibular recess, with partial ringlike enhancement, hyper intense signal in the hypothalamus, right greater than left, possible he also optic asthma, right greater than left,  MRI of thoracic spine showed abnormal signal behind C7 T1-T2, also behind T6, T7, T8, some enhancement of the upper cord abnormality.  Imaging findings along with her history most consistent with relapsing remitting multiple sclerosis, and then all antibodies was negative,  Laboratory evaluations in March 2019, vitamin D level was decreased 24, normal or negative NMO antibody,Lyme titer, ANA, copper, ferritin level was mildly elevated 183, CPK, TSH, C-reactive protein, folic acid, A1c was 5.5, HIV, B12, RPR,  We had discussed extensive treatment options, with her aggressive clinical course, gait abnormality, significant abnormality on MRI of brain and spinal cord, will proceed with IV infusion, offered her the option of Tysarbri versus ocrelizumab  She also complains of significant fatigue,  UPDATE August 6th 2019: She is used to work as Building surveyor for hotel, 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, now she has significant gait abnormality, rely on her cane, also complains of low back pain, over the past few months we have tried Cymbalta, gabapentin, Lyrica, nortriptyline without helping her symptoms, she cannot tolerate NSAIDs due to GI symptoms, and there was also documented anaphylactic reaction,  She [place] since April 2019, last infusion was on July 28, 2017, she tolerated very well, there was no significant side effect noticed, she continue have fatigue, taking Provigil 100 mg daily for her  fatigue  Laboratory evaluation seen April 2019, showed normal or negative varicella-zoster IgG, hepatitis C, B antibody, quantiferonTB Gold antibody, vitamin D level is 24, JC virus antibody was decreased 0.29, Lyme titer was negative, NMO antibody was less than 1.5, ANA, copper, Ferritin 183, A1C 5.5, cpk, tsh, crp, folic acid, HIV, RPR, B12.  CSF showed more than 5 oligoclonal banding, with normal total protein 31  REVIEW OF SYSTEMS: Full 14 system review of systems performed and notable only for dizziness, tremor, depression, back pain, walking difficulty, blurry vision, cough,  ALLERGIES: Allergies  Allergen Reactions  . Dilaudid [Hydromorphone Hcl] Anaphylaxis  . Ibuprofen Anaphylaxis  . Iodine Anaphylaxis  . Penicillins Anaphylaxis    Has patient had a PCN reaction causing immediate rash, facial/tongue/throat swelling, SOB or lightheadedness with hypotension:yes Has patient had a PCN reaction causing severe rash involving mucus membranes or skin necrosis: no Has patient had a PCN reaction that required hospitalization no Has patient had a PCN reaction occurring within the last 10 years: no If all of the above answers are "NO", then may proceed with Cephalosporin use.   . Tramadol Anaphylaxis  . Gabapentin Other (See Comments)    GI upset  . Codeine Nausea Only    HOME MEDICATIONS: Current Outpatient Medications  Medication Sig Dispense Refill  . modafinil (PROVIGIL) 100 MG tablet Take 1 tablet (100 mg total) by mouth daily. 30 tablet 5  . natalizumab (TYSABRI) 300 MG/15ML injection Inject 300 mg into the vein every 30 (thirty) days.     No current facility-administered medications for this visit.     PAST MEDICAL HISTORY: Past Medical History:  Diagnosis Date  . Shingles   . Weakness     PAST SURGICAL HISTORY: Past Surgical History:  Procedure Laterality Date  . ABDOMINAL HYSTERECTOMY    . CHOLECYSTECTOMY      FAMILY HISTORY: History reviewed. No pertinent  family history.  SOCIAL HISTORY:  Social History   Socioeconomic History  . Marital status: Married    Spouse name: Not on file  . Number of children: 0  . Years of education: college  . Highest education level: Not on file  Occupational History  . Occupation: Unemployed  Social Needs  . Financial resource strain: Not on file  . Food insecurity:    Worry: Not on file    Inability: Not on file  . Transportation needs:    Medical: Not on file    Non-medical: Not on file  Tobacco Use  . Smoking status: Current Every Day Smoker    Packs/day: 0.50    Types: Cigarettes  . Smokeless tobacco: Never Used  Substance and Sexual Activity  . Alcohol use: No  . Drug use: No  . Sexual activity: Not on file  Lifestyle  . Physical activity:    Days per week: Not on file    Minutes per session: Not on file  . Stress: Not on file  Relationships  . Social connections:    Talks on phone: Not on file    Gets together: Not on file    Attends religious service: Not on file    Active member of club or organization: Not on file    Attends meetings of clubs or organizations: Not on file    Relationship status: Not on file  . Intimate partner violence:  Fear of current or ex partner: Not on file    Emotionally abused: Not on file    Physically abused: Not on file    Forced sexual activity: Not on file  Other Topics Concern  . Not on file  Social History Narrative   Lives at home with husband.   Caffeine use:  1-2 cups daily.   Right-handed.     PHYSICAL EXAM   Vitals:   08/06/17 1338  BP: (!) 164/97  Pulse: 71  Weight: 97 lb 12 oz (44.3 kg)  Height: 5\' 2"  (1.575 m)    Not recorded      Body mass index is 17.88 kg/m.  PHYSICAL EXAMNIATION:  Gen: NAD, conversant, well nourised, obese, well groomed                     Cardiovascular: Regular rate rhythm, no peripheral edema, warm, nontender. Eyes: Conjunctivae clear without exudates or hemorrhage Neck: Supple, no  carotid bruits. Pulmonary: Clear to auscultation bilaterally   NEUROLOGICAL EXAM:  MENTAL STATUS: Speech:    Speech is normal; fluent and spontaneous with normal comprehension.  Cognition:     Orientation to time, place and person     Normal recent and remote memory     Normal Attention span and concentration     Normal Language, naming, repeating,spontaneous speech     Fund of knowledge   CRANIAL NERVES: CN II: Visual fields are full to confrontation. Fundoscopic exam is normal with sharp discs and no vascular changes. Pupils are round equal and briskly reactive to light. CN III, IV, VI: extraocular movement are normal. No ptosis. CN V: Facial sensation is intact to pinprick in all 3 divisions bilaterally. Corneal responses are intact.  CN VII: Face is symmetric with normal eye closure and smile. CN VIII: Hearing is normal to rubbing fingers CN IX, X: Palate elevates symmetrically. Phonation is normal. CN XI: Head turning and shoulder shrug are intact CN XII: Tongue is midline with normal movements and no atrophy.  MOTOR: She has mild spasticity of bilateral lower extremity, moderate bilateral hip flexion weakness, mild bilateral grip, finger abduction weakness.  REFLEXES: Reflexes are 3 and symmetric at the biceps, triceps, knees, and ankles. Plantar responses are extensor bilaterally  SENSORY: Intact to light touch, pinprick, positional sensation and vibratory sensation are intact in fingers and toes.  COORDINATION: Rapid alternating movements and fine finger movements are intact. There is no dysmetria on finger-to-nose and heel-knee-shin.    GAIT/STANCE: She can cross get up from seated position, but very unsteady, wide-based, cautious stiff gait,   DIAGNOSTIC DATA (LABS, IMAGING, TESTING) - I reviewed patient records, labs, notes, testing and imaging myself where available.   ASSESSMENT AND PLAN  ANIYLA HARLING is a 52 y.o. female   Relapsing remitting multiple  sclerosis  JC virus was negative with titer of 0.29 in March 2019  Tysabri since April 2019.  Home physical therapy  Laboratory evaluation today   Fatigue  Continue Violeta Gelinas, M.D. Ph.D.  [facility] [address] [place], [place] [address] Ph: [phone] Fax: [phone]  CC: Georgina Quint, MD

## 2017-08-07 ENCOUNTER — Telehealth: Payer: Self-pay | Admitting: Neurology

## 2017-08-07 LAB — CBC WITH DIFFERENTIAL/PLATELET
Basophils Absolute: 0 10*3/uL (ref 0.0–0.2)
Basos: 0 %
EOS (ABSOLUTE): 0.1 10*3/uL (ref 0.0–0.4)
EOS: 1 %
HEMATOCRIT: 53.2 % — AB (ref 34.0–46.6)
HEMOGLOBIN: 18.4 g/dL — AB (ref 11.1–15.9)
IMMATURE GRANULOCYTES: 0 %
Immature Grans (Abs): 0 10*3/uL (ref 0.0–0.1)
LYMPHS: 29 %
Lymphocytes Absolute: 3.6 10*3/uL — ABNORMAL HIGH (ref 0.7–3.1)
MCH: 31.3 pg (ref 26.6–33.0)
MCHC: 34.6 g/dL (ref 31.5–35.7)
MCV: 91 fL (ref 79–97)
MONOCYTES: 6 %
MONOS ABS: 0.7 10*3/uL (ref 0.1–0.9)
NEUTROS PCT: 64 %
Neutrophils Absolute: 7.8 10*3/uL — ABNORMAL HIGH (ref 1.4–7.0)
Platelets: 166 10*3/uL (ref 150–450)
RBC: 5.87 x10E6/uL — AB (ref 3.77–5.28)
RDW: 13.6 % (ref 12.3–15.4)
WBC: 12.3 10*3/uL — AB (ref 3.4–10.8)

## 2017-08-07 LAB — COMPREHENSIVE METABOLIC PANEL
ALK PHOS: 495 IU/L — AB (ref 39–117)
ALT: 150 IU/L — AB (ref 0–32)
AST: 147 IU/L — AB (ref 0–40)
Albumin/Globulin Ratio: 1.6 (ref 1.2–2.2)
Albumin: 4.3 g/dL (ref 3.5–5.5)
BUN/Creatinine Ratio: 8 — ABNORMAL LOW (ref 9–23)
BUN: 6 mg/dL (ref 6–24)
Bilirubin Total: 0.4 mg/dL (ref 0.0–1.2)
CO2: 27 mmol/L (ref 20–29)
CREATININE: 0.75 mg/dL (ref 0.57–1.00)
Calcium: 9.5 mg/dL (ref 8.7–10.2)
Chloride: 95 mmol/L — ABNORMAL LOW (ref 96–106)
GFR calc Af Amer: 105 mL/min/{1.73_m2} (ref >59)
GFR calc non Af Amer: 91 mL/min/{1.73_m2} (ref >59)
Globulin, Total: 2.7 g/dL (ref 1.5–4.5)
Glucose: 89 mg/dL (ref 65–99)
Potassium: 2.8 mmol/L — ABNORMAL LOW (ref 3.5–5.2)
Sodium: 145 mmol/L — ABNORMAL HIGH (ref 134–144)
Total Protein: 7 g/dL (ref 6.0–8.5)

## 2017-08-07 MED ORDER — BACLOFEN 10 MG PO TABS: 10.0000 mg | ORAL_TABLET | Freq: Three times a day (TID) | ORAL | 11 refills | Status: DC

## 2017-08-07 NOTE — Telephone Encounter (Signed)
Spoke to the patient - she needed to know where to take her walker prescription.  She is aware to take it to any durable medical equipment company.  She is going to call [facility].

## 2017-08-07 NOTE — Telephone Encounter (Signed)
She has constant pain and pressure around her thoracic and lumbar spine.  Previously tried medications have not been helpful (duloxetine, gabapentin, Lyrica, nortriptyline, meloxicam, cyclobenzaprine, methocarbamol).

## 2017-08-07 NOTE — Telephone Encounter (Signed)
Pt requesting a call to discuss the Rx for her walker. Did not wish to discus further

## 2017-08-07 NOTE — Addendum Note (Signed)
Addended by: Lilla Shook on: 08/07/2017 04:48 PM   Modules accepted: Orders

## 2017-08-07 NOTE — Telephone Encounter (Signed)
Per vo by Dr. Terrace Arabia, she will provide patient with baclofen 10mg , one tablet TID PRN.   Returned call to patient and left detailed message on her voicemail (ok per DPR).

## 2017-08-08 ENCOUNTER — Other Ambulatory Visit: Payer: Self-pay | Admitting: Neurology

## 2017-08-08 ENCOUNTER — Telehealth: Payer: Self-pay | Admitting: Neurology

## 2017-08-08 DIAGNOSIS — R899 Unspecified abnormal finding in specimens from other organs, systems and tissues: Secondary | ICD-10-CM

## 2017-08-08 NOTE — Telephone Encounter (Signed)
Please call patient, laboratory evaluation showed decreased potassium 2.8, abnormal elevated alkaline phosphate, AST, ALT,  elevated WBC 12.3, hemoglobin was elevated 18.4, hematocrit was also elevated 53.2,  I will repeat laboratory evaluation, hepatitis panel,  Find out her next Tysabri infusion time, hold it off for right now,

## 2017-08-08 NOTE — Progress Notes (Signed)
Hold off next Tysarbri infusion

## 2017-08-09 NOTE — Telephone Encounter (Signed)
error 

## 2017-08-12 ENCOUNTER — Telehealth: Payer: Self-pay | Admitting: *Deleted

## 2017-08-12 NOTE — Telephone Encounter (Signed)
Labs collected 08/06/17:  JCV ab 0.19 negative

## 2017-08-16 ENCOUNTER — Telehealth: Payer: Self-pay | Admitting: Neurology

## 2017-08-16 NOTE — Telephone Encounter (Signed)
Pt requesting a call stating she has a few questions regarding medication and her recent MRI. Did not wish to discuss more with me.

## 2017-08-16 NOTE — Telephone Encounter (Signed)
Spoke with [employer].  She sts. she doesn't like the way Baclofen makes her feel--next day drowsiness and legs feel rubbery.  I have explained she may stop it.  She verbalized understanding of same/fim

## 2017-08-19 ENCOUNTER — Other Ambulatory Visit: Payer: Self-pay | Admitting: *Deleted

## 2017-08-19 ENCOUNTER — Telehealth: Payer: Self-pay | Admitting: Neurology

## 2017-08-19 DIAGNOSIS — M545 Low back pain: Secondary | ICD-10-CM

## 2017-08-19 DIAGNOSIS — M79604 Pain in right leg: Secondary | ICD-10-CM

## 2017-08-19 DIAGNOSIS — M79605 Pain in left leg: Secondary | ICD-10-CM

## 2017-08-19 DIAGNOSIS — G35 Multiple sclerosis: Secondary | ICD-10-CM

## 2017-08-19 DIAGNOSIS — R269 Unspecified abnormalities of gait and mobility: Secondary | ICD-10-CM

## 2017-08-19 NOTE — Addendum Note (Signed)
Addended by: Lindell Spar C on: 08/19/2017 12:37 PM   Modules accepted: Orders

## 2017-08-19 NOTE — Addendum Note (Signed)
Addended by: Lindell Spar C on: 08/19/2017 01:32 PM   Modules accepted: Orders

## 2017-08-19 NOTE — Telephone Encounter (Signed)
Pt is asking for a call from RN Marcelino Duster, pt was asked what the call back request is about.  Pt declined stating she just wants to discuss the matter with RN Marcelino Duster

## 2017-08-19 NOTE — Telephone Encounter (Signed)
[employer] Auth: [id number] (exp. 08/19/17 to 10/03/17) order sent to GI. They will reach out to the pt to schedule.

## 2017-08-19 NOTE — Telephone Encounter (Signed)
Per vo by Dr. Terrace Arabia, provide rx for Tramadol 50mg , one tablet q8h prn, #90 x 0. Also, MRI lumbar wo.  Patient is agreeable to this plan.  MRI order placed in Epic.  Tramadol prescription will be sent to pharmacy by Dr. Terrace Arabia.

## 2017-08-20 ENCOUNTER — Other Ambulatory Visit: Payer: Self-pay | Admitting: *Deleted

## 2017-08-20 MED ORDER — TRAMADOL HCL 50 MG PO TABS: 50.0000 mg | ORAL_TABLET | Freq: Three times a day (TID) | ORAL | 0 refills | Status: DC | PRN

## 2017-08-27 ENCOUNTER — Telehealth: Payer: Self-pay | Admitting: Neurology

## 2017-08-27 MED ORDER — ALPRAZOLAM 1 MG PO TABS: ORAL_TABLET | ORAL | 0 refills | Status: DC

## 2017-08-27 NOTE — Telephone Encounter (Signed)
Pt has questions she would like to discuss with RN. She did not want remember what they were at the time of this message

## 2017-08-27 NOTE — Telephone Encounter (Signed)
Patient reported she is claustrophobic and will need Xanax called in for her MRI.  Dr. Terrace Arabia will send the prescription to the pharmacy for her.

## 2017-08-27 NOTE — Telephone Encounter (Addendum)
Spoke to patient - states Tramadol caused nausea and vomiting.  She would like an alternate medication for her back and leg pain.  She has already tried cyclobenzaprine, methocarbamol, duloxetine, gabapentin, Lyrica, nortriptyline and tramadol.  She is in the process of getting her ordered lumbar MRI scheduled.  Per vo by Dr. Terrace Arabia, she needs to proceed with MRI in order to get a better understanding of the cause of pain.  The patient is going to use Tylenol for discomfort.  She may also get benefit with moist heat (being careful not to burn her skin).  She is agreeable to this plan.

## 2017-08-30 ENCOUNTER — Telehealth: Payer: Self-pay | Admitting: Neurology

## 2017-08-30 MED ORDER — TRAZODONE HCL 50 MG PO TABS
50.0000 mg | ORAL_TABLET | Freq: Every day | ORAL | 3 refills | Status: DC
Start: 2017-08-30 — End: 2017-09-27

## 2017-08-30 NOTE — Telephone Encounter (Signed)
Patient complains of constant bilateral anterior thigh muscle achy pain, previous tried and failed gabapentin, Lyrica, nortriptyline, baclofen, Flexeril, trazodone,   Reported anaphylactic reaction to NSAIDs ibuprofen, not taking Tylenol Extra Strength as needed, difficulty sleeping, constant 24/7 pain,  I will give her low-dose of trazodone 50 mg every night, continue back stretching, leg stretching, heating pad, Tylenol as needed during the day

## 2017-09-16 ENCOUNTER — Ambulatory Visit
Admission: RE | Admit: 2017-09-16 | Discharge: 2017-09-16 | Disposition: A | Discharge status: Home / Self Care | Payer: 59 | Source: Ambulatory Visit | Attending: Neurology | Admitting: Neurology

## 2017-09-16 DIAGNOSIS — M79605 Pain in left leg: Secondary | ICD-10-CM

## 2017-09-16 DIAGNOSIS — R269 Unspecified abnormalities of gait and mobility: Secondary | ICD-10-CM

## 2017-09-16 DIAGNOSIS — G35D Multiple sclerosis, unspecified: Secondary | ICD-10-CM

## 2017-09-16 DIAGNOSIS — M79604 Pain in right leg: Secondary | ICD-10-CM

## 2017-09-16 DIAGNOSIS — M545 Low back pain: Secondary | ICD-10-CM

## 2017-09-16 DIAGNOSIS — G35 Multiple sclerosis: Secondary | ICD-10-CM

## 2017-09-17 ENCOUNTER — Telehealth: Payer: Self-pay | Admitting: Neurology

## 2017-09-17 NOTE — Telephone Encounter (Signed)
Please call patient, MRI lumbar showed degenerative changes, there is no evidence of root or canal compression   IMPRESSION: This MRI of the lumbar spine without contrast shows mild degenerative changes at L3-L4, L4-L5 and L5-S1 that do not lead to nerve root compression or spinal stenosis.  There are no acute findings.

## 2017-09-17 NOTE — Telephone Encounter (Signed)
Rn call patient that the MRI lumbar spine results. Rn stated that the MRI lumbar showed degenerative changes, there is no evidence of root or canal compression This MRI of the lumbar spine without contrast shows mild degenerative changes at L3-L4, L4-L5 and L5-S1 that do not lead to nerve root compression or spinal stenosis. There are no acute findings.PT verbalized understanding,and is continuing to use walker for safety reason,and mobility.

## 2017-09-19 NOTE — Telephone Encounter (Signed)
Pt requesting a call stating she has a few extra questions regarding her MRI please advise

## 2017-09-19 NOTE — Telephone Encounter (Signed)
RN spoke with pt. Pt wanted to know about the degenerative changes per Dr. Terrace Arabia. Rn explain the meaning of it. Pt verbalized understanding. PT will sign a release form, and will get a copy of her scan once she gets her infusion this month at our office.

## 2017-09-27 ENCOUNTER — Other Ambulatory Visit: Payer: Self-pay | Admitting: Neurology

## 2017-09-27 ENCOUNTER — Telehealth: Payer: Self-pay | Admitting: Neurology

## 2017-09-27 MED ORDER — CITALOPRAM HYDROBROMIDE 10 MG PO TABS: 10.0000 mg | ORAL_TABLET | Freq: Every day | ORAL | 11 refills | Status: DC

## 2017-09-27 NOTE — Telephone Encounter (Signed)
Patient complains of stress and anxiety, I ordered celexa 10mg  daily for her

## 2017-10-25 NOTE — Addendum Note (Signed)
Addended by: Tamera Stands D on: 10/25/2017 11:40 AM   Modules accepted: Orders

## 2017-10-26 LAB — CBC WITH DIFFERENTIAL
Basophils Absolute: 0.2 10*3/uL (ref 0.0–0.2)
Basos: 1 %
EOS (ABSOLUTE): 0.2 10*3/uL (ref 0.0–0.4)
Eos: 2 %
Hematocrit: 40.6 % (ref 34.0–46.6)
Hemoglobin: 14.4 g/dL (ref 11.1–15.9)
IMMATURE GRANULOCYTES: 0 %
Immature Grans (Abs): 0 10*3/uL (ref 0.0–0.1)
LYMPHS ABS: 3.7 10*3/uL — AB (ref 0.7–3.1)
Lymphs: 30 %
MCH: 31 pg (ref 26.6–33.0)
MCHC: 35.5 g/dL (ref 31.5–35.7)
MCV: 88 fL (ref 79–97)
MONOS ABS: 0.7 10*3/uL (ref 0.1–0.9)
Monocytes: 6 %
Neutrophils Absolute: 7.7 10*3/uL — ABNORMAL HIGH (ref 1.4–7.0)
Neutrophils: 61 %
RBC: 4.64 x10E6/uL (ref 3.77–5.28)
RDW: 14.1 % (ref 12.3–15.4)
WBC: 12.4 10*3/uL — AB (ref 3.4–10.8)

## 2017-10-26 LAB — COMPREHENSIVE METABOLIC PANEL
A/G RATIO: 1.8 (ref 1.2–2.2)
ALK PHOS: 148 IU/L — AB (ref 39–117)
ALT: 5 IU/L (ref 0–32)
AST: 12 IU/L (ref 0–40)
Albumin: 3.9 g/dL (ref 3.5–5.5)
BILIRUBIN TOTAL: 0.3 mg/dL (ref 0.0–1.2)
BUN/Creatinine Ratio: 8 — ABNORMAL LOW (ref 9–23)
BUN: 6 mg/dL (ref 6–24)
CHLORIDE: 98 mmol/L (ref 96–106)
CO2: 31 mmol/L — ABNORMAL HIGH (ref 20–29)
Calcium: 8.9 mg/dL (ref 8.7–10.2)
Creatinine, Ser: 0.75 mg/dL (ref 0.57–1.00)
GFR calc Af Amer: 105 mL/min/{1.73_m2} (ref >59)
GFR calc non Af Amer: 91 mL/min/{1.73_m2} (ref >59)
GLUCOSE: 76 mg/dL (ref 65–99)
Globulin, Total: 2.2 g/dL (ref 1.5–4.5)
POTASSIUM: 2.5 mmol/L — AB (ref 3.5–5.2)
Sodium: 144 mmol/L (ref 134–144)
TOTAL PROTEIN: 6.1 g/dL (ref 6.0–8.5)

## 2017-10-26 LAB — HEPATITIS PANEL, ACUTE
HEP A IGM: NEGATIVE
HEP B C IGM: NEGATIVE
HEP B S AG: NEGATIVE

## 2017-10-28 ENCOUNTER — Telehealth: Payer: Self-pay | Admitting: Neurology

## 2017-10-28 NOTE — Telephone Encounter (Signed)
Left patient a detailed message, with results, on voicemail (ok per DPR).  Provided our number to call back with any questions.  Also, left list of potassium rich foods on the message and encouraged her to incorporate these into her daily diet.  She was instructed to follow up with her PCP concerning her low potassium.

## 2017-10-28 NOTE — Telephone Encounter (Signed)
Please call patient, laboratory evaluation showed low potassium 2.5, was 2.8 also decreased 2 months ago, mild elevated alkaline phosphate which has much improved compared to previous study 2 months ago,  She should take potassium rich food, Food Sources of Potassium Bananas, oranges, cantaloupe, honeydew, apricots, grapefruit (some dried fruits, such as prunes, raisins, and dates, are also high in potassium) Cooked spinach. Cooked broccoli. Potatoes. Sweet potatoes. Mushrooms. Peas. Cucumbers.  Continuing with her primary care for etiology of her hypokalemia  CBC showed mild elevated WBC of 12.4, which is also at her baseline

## 2017-12-16 DIAGNOSIS — Z0289 Encounter for other administrative examinations: Secondary | ICD-10-CM

## 2017-12-20 DIAGNOSIS — G35 Multiple sclerosis: Secondary | ICD-10-CM | POA: Diagnosis not present

## 2017-12-30 ENCOUNTER — Telehealth: Payer: Self-pay | Admitting: *Deleted

## 2017-12-30 NOTE — Telephone Encounter (Signed)
PA for modafinil started via covermymeds (key: [id number]: [id number]).  Pt has coverage with [employer] [phone]).  Pt GU#[id number].  Decision pending.

## 2018-01-02 MED ORDER — ARMODAFINIL 150 MG PO TABS: 150.0000 mg | ORAL_TABLET | Freq: Every day | ORAL | 5 refills | Status: DC

## 2018-01-02 NOTE — Addendum Note (Signed)
Addended by: Joycelyn Schmid R on: 01/02/2018 03:00 PM   Modules accepted: Orders

## 2018-01-02 NOTE — Telephone Encounter (Signed)
Spoke to patient - she is agreeable to change to armodafinil 150mg , one tablet daily.

## 2018-01-02 NOTE — Addendum Note (Signed)
Addended by: Lindell Spar C on: 01/02/2018 01:14 PM   Modules accepted: Orders

## 2018-01-02 NOTE — Telephone Encounter (Signed)
Ok to switch. -VRP 

## 2018-01-02 NOTE — Telephone Encounter (Signed)
PA for modafinil denied.  Her new plan coverage states she has to try to armodafinil because it is now the preferred medication.

## 2018-02-12 ENCOUNTER — Ambulatory Visit: Payer: 59 | Admitting: Nurse Practitioner

## 2018-02-14 DIAGNOSIS — G35 Multiple sclerosis: Secondary | ICD-10-CM | POA: Diagnosis not present

## 2018-02-18 ENCOUNTER — Ambulatory Visit: Payer: BLUE CROSS/BLUE SHIELD | Admitting: Neurology

## 2018-02-18 ENCOUNTER — Encounter: Payer: Self-pay | Admitting: Neurology

## 2018-02-18 ENCOUNTER — Telehealth: Payer: Self-pay | Admitting: *Deleted

## 2018-02-18 VITALS — BP 132/91 | HR 88 | Ht 62.0 in | Wt 95.5 lb

## 2018-02-18 DIAGNOSIS — E559 Vitamin D deficiency, unspecified: Secondary | ICD-10-CM | POA: Diagnosis not present

## 2018-02-18 DIAGNOSIS — G35 Multiple sclerosis: Secondary | ICD-10-CM | POA: Diagnosis not present

## 2018-02-18 DIAGNOSIS — R202 Paresthesia of skin: Secondary | ICD-10-CM

## 2018-02-18 DIAGNOSIS — R269 Unspecified abnormalities of gait and mobility: Secondary | ICD-10-CM | POA: Diagnosis not present

## 2018-02-18 DIAGNOSIS — Z7253 High risk bisexual behavior: Secondary | ICD-10-CM

## 2018-02-18 MED ORDER — DIAZEPAM 5 MG PO TABS: ORAL_TABLET | ORAL | 0 refills | Status: DC

## 2018-02-18 MED ORDER — BACLOFEN 10 MG PO TABS: 10.0000 mg | ORAL_TABLET | Freq: Three times a day (TID) | ORAL | 6 refills | Status: DC

## 2018-02-18 NOTE — Telephone Encounter (Signed)
Placed [facility] lab in [facility] lock box for routine lab pick up.  

## 2018-02-18 NOTE — Progress Notes (Signed)
[name] DOB: 1965-06-30  Chief Complaint  Patient presents with  . Multiple Sclerosis    Reports increased leg weakness along with painful muscle spasms.  The spasms are disruptive to her sleep.  She is using a rolling walker to assist with ambulation.  Nuvigil works well for her daytime fatigue.  She has continued Tysabri treatment.  She wears Copper Hands gloves for her achy hands and they work well.      HISTORICAL  Gabriela Campbell 53 year old female, seen in refer by her primary care doctor [name], [name], for evaluation of weakness, initial evaluation was on March 14, 2017.  I reviewed and summarized the referring note, she had a history of hysterectomy in 2003 cholecystectomy in 1999, long time smoker, still smoke half pack a day,  She was highly functional until June 2018, without clear triggers, she began to notice bilateral anterior thigh muscle achy pain, spasm, also noticed gradual onset gait abnormality, which has progressively worse over the past few months, recently she also noticed bilateral hands numbness tingling, clumsy of bilateral hand,  Initially she has mild low back pain, but no longer has significant low back pain, she denies neck pain, denies bowel bladder incontinence, she denies dysarthria, no diplopia no dysphagia.  I reviewed the laboratory evaluation in February 2019, CBC showed elevated WBC of 15.3, hemoglobin of 16.6, normal BMP, negative troponin, CPK was 25, CMP showed elevated alkaline phosphate 156, UA showed evidence of bacteria, normal TSH, negative HIV,  UPDATE March 28 2017: Patient is accompanied by her friend at today's clinical visit, she continue complains of slow decline gait abnormality, paresthesia achy pain of bilateral lower extremity.  MRI of cervical spine with without contrast [facility] in March 2019 showed evidence of signal abnormality behind C3 and 4, with mild enhancement,  MRI of brain showed  evidence of multiple area of abnormal T2/FLAIR hyperintensity signal in the white matter at both hemisphere predominantly periventricular, there was also T1 black holes, involving the lower pons, medulla, deep left cerebellar white matter, faint enhancement of at least one lesion in the left frontal lobe, unusual bulbous appearing of the anterior, inferior third ventricle in the region of the infundibular recess, with partial ringlike enhancement, hyper intense signal in the hypothalamus, right greater than left, possible he also optic asthma, right greater than left,  MRI of thoracic spine showed abnormal signal behind C7 T1-T2, also behind T6, T7, T8, some enhancement of the upper cord abnormality.  Imaging findings along with her history most consistent with relapsing remitting multiple sclerosis,   Laboratory evaluations in March 2019, vitamin D level was decreased 24, normal or negative NMO antibody were negative,Lyme titer, ANA, copper, ferritin level was mildly elevated 183, CPK, TSH, C-reactive protein, folic acid, A1c was 5.5, HIV, B12, RPR,  We had discussed extensive treatment options, with her aggressive clinical course, gait abnormality, significant abnormality on MRI of brain and spinal cord, will proceed with IV infusion, offered her the option of Tysarbri versus ocrelizumab  She also complains of significant fatigue,  UPDATE August 6th 2019: She is used to work as Building surveyorcleaning clerk for hotel, 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, now she has significant gait abnormality, rely on her cane, also complains of low back pain, over the past few months we have tried Cymbalta, gabapentin, Lyrica, nortriptyline without helping her symptoms, she cannot tolerate NSAIDs due to GI symptoms, and there was also documented anaphylactic  reaction,  [place] since April 2019, last infusion was on July 28, 2017, she tolerated very well, there was no significant side effect noticed,  she continue have fatigue, taking Provigil 100 mg daily for her fatigue  Laboratory evaluation seen April 2019, showed normal or negative varicella-zoster IgG, hepatitis C, B antibody, quantiferonTB Gold antibody, vitamin D level is 24, JC virus antibody was decreased 0.29, Lyme titer was negative, NMO antibody was less than 1.5, ANA, copper, Ferritin 183, A1C 5.5, cpk, tsh, crp, folic acid, HIV, RPR, B12.  CSF showed more than 5 oligoclonal banding, with normal total protein 31  UPDATE Feb 18 2018: She is tolerating Tysabri infusion well, continue complains of gait abnormality, bilateral lower extremity deep achy pain, especially at nighttime, ambulate with a walker, fatigue has been helped by Nuvigil  Previously she has tried Cymbalta, gabapentin, Lyrica without helping her symptoms  REVIEW OF SYSTEMS: Full 14 system review of systems performed and notable only for dizziness, cough, joint pain, back pain, muscle cramps, walking difficulty, headaches, numbness, weakness, tremor,  All rest review of the system were negative ALLERGIES: Allergies  Allergen Reactions  . Dilaudid [Hydromorphone Hcl] Anaphylaxis  . Ibuprofen Anaphylaxis  . Iodine Anaphylaxis  . Penicillins Anaphylaxis    Has patient had a PCN reaction causing immediate rash, facial/tongue/throat swelling, SOB or lightheadedness with hypotension:yes Has patient had a PCN reaction causing severe rash involving mucus membranes or skin necrosis: no Has patient had a PCN reaction that required hospitalization no Has patient had a PCN reaction occurring within the last 10 years: no If all of the above answers are "NO", then may proceed with Cephalosporin use.   . Gabapentin Other (See Comments)    GI upset  . Codeine Nausea Only  . Tramadol Nausea And Vomiting    HOME MEDICATIONS: Current Outpatient Medications  Medication Sig Dispense Refill  . Armodafinil 150 MG tablet Take 1 tablet (150 mg total) by mouth daily. 30 tablet  5  . natalizumab (TYSABRI) 300 MG/15ML injection Inject 300 mg into the vein every 30 (thirty) days.     No current facility-administered medications for this visit.     PAST MEDICAL HISTORY: Past Medical History:  Diagnosis Date  . Shingles   . Weakness     PAST SURGICAL HISTORY: Past Surgical History:  Procedure Laterality Date  . ABDOMINAL HYSTERECTOMY    . CHOLECYSTECTOMY      FAMILY HISTORY: No family history on file.  SOCIAL HISTORY:  Social History   Socioeconomic History  . Marital status: Married    Spouse name: Not on file  . Number of children: 0  . Years of education: college  . Highest education level: Not on file  Occupational History  . Occupation: Unemployed  Social Needs  . Financial resource strain: Not on file  . Food insecurity:    Worry: Not on file    Inability: Not on file  . Transportation needs:    Medical: Not on file    Non-medical: Not on file  Tobacco Use  . Smoking status: Current Every Day Smoker    Packs/day: 0.50    Types: Cigarettes  . Smokeless tobacco: Never Used  Substance and Sexual Activity  . Alcohol use: No  . Drug use: No  . Sexual activity: Not on file  Lifestyle  . Physical activity:    Days per week: Not on file    Minutes per session: Not on file  . Stress: Not on file  Relationships  . Social connections:    Talks on phone: Not on file    Gets together: Not on file    Attends religious service: Not on file    Active member of club or organization: Not on file    Attends meetings of clubs or organizations: Not on file    Relationship status: Not on file  . Intimate partner violence:    Fear of current or ex partner: Not on file    Emotionally abused: Not on file    Physically abused: Not on file    Forced sexual activity: Not on file  Other Topics Concern  . Not on file  Social History Narrative   Lives at home with husband.   Caffeine use:  1-2 cups daily.   Right-handed.     PHYSICAL EXAM     Vitals:   02/18/18 1256  BP: (!) 132/91  Pulse: 88  Weight: 95 lb 8 oz (43.3 kg)  Height: 5\' 2"  (1.575 m)    Not recorded      Body mass index is 17.47 kg/m.  PHYSICAL EXAMNIATION:  Gen: NAD, conversant, well nourised, obese, well groomed                     Cardiovascular: Regular rate rhythm, no peripheral edema, warm, nontender. Eyes: Conjunctivae clear without exudates or hemorrhage Neck: Supple, no carotid bruits. Pulmonary: Clear to auscultation bilaterally   NEUROLOGICAL EXAM:  MENTAL STATUS: Speech:    Speech is normal; fluent and spontaneous with normal comprehension.  Cognition:     Orientation to time, place and person     Normal recent and remote memory     Normal Attention span and concentration     Normal Language, naming, repeating,spontaneous speech     Fund of knowledge   CRANIAL NERVES: CN II: Visual fields are full to confrontation.  Pupils are round equal and briskly reactive to light. CN III, IV, VI: extraocular movement are normal. No ptosis. CN V: Facial sensation is intact to pinprick in all 3 divisions bilaterally. Corneal responses are intact.  CN VII: Face is symmetric with normal eye closure and smile. CN VIII: Hearing is normal to rubbing fingers CN IX, X: Palate elevates symmetrically. Phonation is normal. CN XI: Head turning and shoulder shrug are intact CN XII: Tongue is midline with normal movements and no atrophy.  MOTOR: She has mild spasticity of bilateral lower extremity, moderate bilateral hip flexion weakness, mild bilateral grip, finger abduction weakness.  REFLEXES: Reflexes are 3 and symmetric at the biceps, triceps, knees, and ankles, nonsustained ankle clonus, plantar responses are extensor bilaterally  SENSORY: Intact to light touch, pinprick, positional sensation and vibratory sensation are intact in fingers and toes.  COORDINATION: Rapid alternating movements and fine finger movements are intact. There is no  dysmetria on finger-to-nose and heel-knee-shin.    GAIT/STANCE: She needs to push up from seated position, very unsteady, wide-based, cautious stiff gait,   DIAGNOSTIC DATA (LABS, IMAGING, TESTING) - I reviewed patient records, labs, notes, testing and imaging myself where available.   ASSESSMENT AND PLAN  ZALIE JERSEY is a 53 y.o. female   Relapsing remitting multiple sclerosis  Evidence of cervical, thoracic, involvement.  JC virus was negative with titer of 0.29 in March 2019  Tysabri since April 2019.  Start Home physical therapy  Laboratory evaluation today, repeat JC virus titer,  Repeat MRI of the brain with without contrast  Gait abnormality, bilateral lower extremity  deep achy pain  Baclofen 10 mg 3 times a day   Fatigue  Continue Nuvigil  Levert Feinstein, M.D. Ph.D.  [facility] [address] [place], [place] [address] Ph: [phone] Fax: [phone]  CC: Georgina Quint, MD

## 2018-02-19 ENCOUNTER — Telehealth: Payer: Self-pay | Admitting: Neurology

## 2018-02-19 NOTE — Telephone Encounter (Signed)
[employer] Auth: [id number] (exp. 02/19/18 to 03/20/18) order sent to GI. They will reach out to the pt to schedule.

## 2018-02-25 ENCOUNTER — Telehealth: Payer: Self-pay | Admitting: *Deleted

## 2018-02-25 NOTE — Telephone Encounter (Signed)
Labs collected 02/18/2018:  JCV ab: 0.14 negative

## 2018-02-28 ENCOUNTER — Telehealth: Payer: Self-pay | Admitting: Neurology

## 2018-02-28 DIAGNOSIS — R945 Abnormal results of liver function studies: Secondary | ICD-10-CM

## 2018-02-28 DIAGNOSIS — G35 Multiple sclerosis: Secondary | ICD-10-CM

## 2018-02-28 DIAGNOSIS — R7989 Other specified abnormal findings of blood chemistry: Secondary | ICD-10-CM

## 2018-02-28 LAB — CBC WITH DIFFERENTIAL/PLATELET
Basophils Absolute: 0.1 10*3/uL (ref 0.0–0.2)
Basos: 1 %
EOS (ABSOLUTE): 0.2 10*3/uL (ref 0.0–0.4)
EOS: 1 %
HEMATOCRIT: 42 % (ref 34.0–46.6)
Hemoglobin: 14.9 g/dL (ref 11.1–15.9)
IMMATURE GRANS (ABS): 0 10*3/uL (ref 0.0–0.1)
IMMATURE GRANULOCYTES: 0 %
Lymphocytes Absolute: 3 10*3/uL (ref 0.7–3.1)
Lymphs: 28 %
MCH: 32 pg (ref 26.6–33.0)
MCHC: 35.5 g/dL (ref 31.5–35.7)
MCV: 90 fL (ref 79–97)
MONOS ABS: 1.1 10*3/uL — AB (ref 0.1–0.9)
Monocytes: 11 %
NEUTROS ABS: 6.4 10*3/uL (ref 1.4–7.0)
Neutrophils: 59 %
PLATELETS: 196 10*3/uL (ref 150–450)
RBC: 4.65 x10E6/uL (ref 3.77–5.28)
RDW: 13.8 % (ref 11.7–15.4)
WBC: 10.7 10*3/uL (ref 3.4–10.8)

## 2018-02-28 LAB — COMPREHENSIVE METABOLIC PANEL
A/G RATIO: 2 (ref 1.2–2.2)
ALBUMIN: 4.3 g/dL (ref 3.8–4.9)
ALT: 297 IU/L — ABNORMAL HIGH (ref 0–32)
AST: 387 IU/L — ABNORMAL HIGH (ref 0–40)
Alkaline Phosphatase: 523 IU/L — ABNORMAL HIGH (ref 39–117)
BUN / CREAT RATIO: 15 (ref 9–23)
BUN: 12 mg/dL (ref 6–24)
Bilirubin Total: <0.2 mg/dL (ref 0.0–1.2)
CO2: 24 mmol/L (ref 20–29)
CREATININE: 0.8 mg/dL (ref 0.57–1.00)
Calcium: 9.3 mg/dL (ref 8.7–10.2)
Chloride: 98 mmol/L (ref 96–106)
GFR, EST AFRICAN AMERICAN: 97 mL/min/{1.73_m2} (ref >59)
GFR, EST NON AFRICAN AMERICAN: 84 mL/min/{1.73_m2} (ref >59)
GLOBULIN, TOTAL: 2.2 g/dL (ref 1.5–4.5)
Glucose: 85 mg/dL (ref 65–99)
POTASSIUM: 4.3 mmol/L (ref 3.5–5.2)
SODIUM: 137 mmol/L (ref 134–144)
TOTAL PROTEIN: 6.5 g/dL (ref 6.0–8.5)

## 2018-02-28 LAB — VITAMIN D 25 HYDROXY (VIT D DEFICIENCY, FRACTURES): Vit D, 25-Hydroxy: 52 ng/mL (ref 30.0–100.0)

## 2018-02-28 LAB — ANTI-TYSABRI (NATALIZUMAB) AB: ANTI-TYSABRI(NATALIZUMAB) AB: NEGATIVE

## 2018-02-28 NOTE — Telephone Encounter (Signed)
LMTC./fim 

## 2018-02-28 NOTE — Telephone Encounter (Signed)
Spoke with Gabriela Campbell and explained that her lft's are high right now; she will not be able to have her next Tysabri infusion b/c there is a rare chance that Tysabri could affect her liver.  We need to try and find the cause for the impaired liver function.  Dr. Terrace Arabia would like her to come back to the office next wk. to have labwork repeated. Pt. is agreeable--she will come in around 3pm next Thursday. Dr. Terrace Arabia has also ordered an abd. u/s, and once approved, she will receive a call to schedule this. Pt. is agreeable. I asked about other potential medications that she may be taking that could affect her liver function, and she says she has been taking otc Tylenol every 6 hours for the last 1-2 wks.  I will let Dr. Terrace Arabia know this/fim

## 2018-02-28 NOTE — Telephone Encounter (Signed)
I did advise pt. to stop all Tylenol products.

## 2018-02-28 NOTE — Telephone Encounter (Signed)
Pt has returned the call to RN Faith, she is asking for a call back °

## 2018-02-28 NOTE — Telephone Encounter (Signed)
Please call patient, lab evaluation showed significant abnormal liver functional test, please advise her stop next Tysarbri infusion (date?), there is rare report of Tysabri liver toxicity.  Please also check with her to see if she is taking any over-the-counter supplement that is potentially liver toxic.  Please let her come in next week for repeat liver functional test, hepatitis panel, order was placed, I also ordered ultrasound of abdomen

## 2018-03-03 DIAGNOSIS — Z0271 Encounter for disability determination: Secondary | ICD-10-CM

## 2018-03-04 ENCOUNTER — Telehealth: Payer: Self-pay | Admitting: Neurology

## 2018-03-04 DIAGNOSIS — M79605 Pain in left leg: Principal | ICD-10-CM

## 2018-03-04 DIAGNOSIS — M79604 Pain in right leg: Secondary | ICD-10-CM

## 2018-03-04 NOTE — Telephone Encounter (Signed)
She has been taking baclofen 10mg  TID.  She does not feel the medication has been helpful for he leg pain.  Additionally, she feels it causes her to be light-headed and dizzy.  She would like to know if there is an alternate medication she can try for her leg pain.

## 2018-03-04 NOTE — Telephone Encounter (Signed)
Patient called in and stated that the baclofen (LIORESAL) 10 MG tablet isnt working properly its making her light headed and making her legs weak. She stated she fell 3 days ago.

## 2018-03-05 MED ORDER — OXCARBAZEPINE 150 MG PO TABS: 150.0000 mg | ORAL_TABLET | Freq: Two times a day (BID) | ORAL | 0 refills | Status: DC

## 2018-03-05 NOTE — Addendum Note (Signed)
Addended by: Lindell Spar C on: 03/05/2018 02:52 PM   Modules accepted: Orders

## 2018-03-05 NOTE — Telephone Encounter (Signed)
It is okay for her to taper down baclofen to lower dose, do not stop it suddenly, 10 mg twice a day for 1 week, then once a day for a week, then stop,  Reviewed record, she has tried and failed different medications in the past NSAIDs, Lyrica, gabapentin, nortriptyline, Cymbalta are helping her symptoms,  The other options are Trileptal 150 mg twice a day, refer to pain management, I will not write her narcortics for pain,

## 2018-03-05 NOTE — Telephone Encounter (Signed)
I called the patient back again.  States she stopped the baclofen four days ago.  She is agreeable to try Trileptal 150mg , one tablet BID.  She is aware if it is not helpful, to call our office before stopping the medication.  She would also like to go ahead and move forward with a pain management referral.  States her leg pain is interfering with her daily activities.

## 2018-03-05 NOTE — Telephone Encounter (Addendum)
Per vo by Dr. Terrace Arabia, ok to place order for pain management and send in generic Trileptal prescription.

## 2018-03-05 NOTE — Addendum Note (Signed)
Addended by: Lilla Shook on: 03/05/2018 03:28 PM   Modules accepted: Orders

## 2018-03-06 ENCOUNTER — Other Ambulatory Visit (INDEPENDENT_AMBULATORY_CARE_PROVIDER_SITE_OTHER): Payer: Self-pay

## 2018-03-06 DIAGNOSIS — R945 Abnormal results of liver function studies: Secondary | ICD-10-CM

## 2018-03-06 DIAGNOSIS — Z0289 Encounter for other administrative examinations: Secondary | ICD-10-CM

## 2018-03-06 DIAGNOSIS — R7989 Other specified abnormal findings of blood chemistry: Secondary | ICD-10-CM

## 2018-03-06 DIAGNOSIS — G35 Multiple sclerosis: Secondary | ICD-10-CM | POA: Diagnosis not present

## 2018-03-07 ENCOUNTER — Ambulatory Visit
Admission: RE | Admit: 2018-03-07 | Discharge: 2018-03-07 | Disposition: A | Discharge status: Home / Self Care | Payer: 59 | Source: Ambulatory Visit | Attending: Neurology | Admitting: Neurology

## 2018-03-07 ENCOUNTER — Telehealth: Payer: Self-pay | Admitting: Neurology

## 2018-03-07 DIAGNOSIS — R7989 Other specified abnormal findings of blood chemistry: Secondary | ICD-10-CM

## 2018-03-07 DIAGNOSIS — R945 Abnormal results of liver function studies: Secondary | ICD-10-CM

## 2018-03-07 DIAGNOSIS — G35 Multiple sclerosis: Secondary | ICD-10-CM

## 2018-03-07 LAB — HEPATITIS PANEL, ACUTE
HEP B C IGM: NEGATIVE
Hep A IgM: NEGATIVE
Hep C Virus Ab: <0.1 s/co ratio (ref 0.0–0.9)
Hepatitis B Surface Ag: NEGATIVE

## 2018-03-07 LAB — HEPATIC FUNCTION PANEL
ALBUMIN: 4.1 g/dL (ref 3.8–4.9)
ALK PHOS: 275 IU/L — AB (ref 39–117)
ALT: 21 IU/L (ref 0–32)
AST: 15 IU/L (ref 0–40)
Bilirubin Total: 0.3 mg/dL (ref 0.0–1.2)
Bilirubin, Direct: 0.08 mg/dL (ref 0.00–0.40)
Total Protein: 6.5 g/dL (ref 6.0–8.5)

## 2018-03-07 NOTE — Telephone Encounter (Signed)
Left patient a detailed message, with results, on voicemail (ok per DPR).  Provided our number to call back with any questions.  

## 2018-03-07 NOTE — Telephone Encounter (Signed)
Please call patient, repeat liver functional test has much improved, but still has elevated alkaline phosphate level, hepatitis panels were negative

## 2018-03-10 ENCOUNTER — Telehealth: Payer: Self-pay | Admitting: Neurology

## 2018-03-10 NOTE — Telephone Encounter (Signed)
I have spoken with the patient and she is aware of her ultrasound results.  Her last Tysabri infusion was on 02/14/2018.  She has a pending appt with Dr. Terrace Arabia on 04/02/2018 to have her labs repeated and discuss other available treatment options.  She feels she is doing well on Tysabri but would be interested in discussing other options that would not require to come to our office every four weeks.

## 2018-03-10 NOTE — Telephone Encounter (Signed)
Please call patient, there is no acute abnormality noted on ultrasound of abdomen,  1.  Please check out her schedule Tysabri infusion, 2.  Last liver functional test was on March 06, 2018, which showed significantly improvement compared to previous scan on February 18, 2018, may consider repeat test again in early April, if is back to normal, will resume Tysabri infusion

## 2018-03-14 ENCOUNTER — Ambulatory Visit
Admission: RE | Admit: 2018-03-14 | Discharge: 2018-03-14 | Disposition: A | Discharge status: Home / Self Care | Payer: 59 | Source: Ambulatory Visit | Attending: Neurology | Admitting: Neurology

## 2018-03-14 ENCOUNTER — Other Ambulatory Visit: Payer: Self-pay

## 2018-03-14 DIAGNOSIS — G35 Multiple sclerosis: Secondary | ICD-10-CM

## 2018-03-14 DIAGNOSIS — R269 Unspecified abnormalities of gait and mobility: Secondary | ICD-10-CM

## 2018-03-14 DIAGNOSIS — E559 Vitamin D deficiency, unspecified: Secondary | ICD-10-CM

## 2018-03-14 DIAGNOSIS — R202 Paresthesia of skin: Secondary | ICD-10-CM

## 2018-03-14 MED ORDER — GADOBENATE DIMEGLUMINE 529 MG/ML IV SOLN
8.0000 mL | Freq: Once | INTRAVENOUS | Status: AC | PRN
  Administered 2018-03-14: 8 mL via INTRAVENOUS

## 2018-03-17 ENCOUNTER — Telehealth: Payer: Self-pay | Admitting: Neurology

## 2018-03-17 NOTE — Telephone Encounter (Signed)
Left patient a detailed message, with results, on voicemail (ok per DPR).  Provided our number to call back with any questions.  

## 2018-03-17 NOTE — Telephone Encounter (Signed)
Please call patient, MRI of the brain showed lesions consistent with multiple sclerosis, will review films with her on April 1 follow-up  IMPRESSION:  Abnormal MRI scan of the brain showing bilateral scattered mostly supratentorial periventricular and subcortical white matter hyperintensities in a pattern and distribution compatible with demyelinating disease. Solitary subtle linear enhancing lesion is noted in the right frontal juxtacortical region indicative of active disease. Presence of multiple T 1 black holes and mild supratentorial cortical atrophy indicates chronic disease.

## 2018-03-24 ENCOUNTER — Telehealth: Payer: Self-pay | Admitting: Neurology

## 2018-03-24 ENCOUNTER — Telehealth (INDEPENDENT_AMBULATORY_CARE_PROVIDER_SITE_OTHER): Payer: BLUE CROSS/BLUE SHIELD | Admitting: Neurology

## 2018-03-24 DIAGNOSIS — M79604 Pain in right leg: Secondary | ICD-10-CM | POA: Diagnosis not present

## 2018-03-24 DIAGNOSIS — M79605 Pain in left leg: Secondary | ICD-10-CM | POA: Diagnosis not present

## 2018-03-24 DIAGNOSIS — G35 Multiple sclerosis: Secondary | ICD-10-CM

## 2018-03-24 DIAGNOSIS — R5383 Other fatigue: Secondary | ICD-10-CM

## 2018-03-24 MED ORDER — TIZANIDINE HCL 4 MG PO TABS: 4.0000 mg | ORAL_TABLET | Freq: Three times a day (TID) | ORAL | 5 refills | Status: DC | PRN

## 2018-03-24 NOTE — Progress Notes (Signed)
Virtual Visit via Telephone Note  I connected with [name] on 03/24/18 at  by telephone and verified that I am speaking with the correct person using two identifiers.   I discussed the limitations, risks, security and privacy concerns of performing an evaluation and management service by telephone and the availability of in person appointments. I also discussed with the patient that there may be a patient responsible charge related to this service. The patient expressed understanding and agreed to proceed.   History of Present Illness: Gabriela Campbell 53 year old female, seen in refer by her primary care doctor Georgina Quint, for evaluation of weakness, initial evaluation was on March 14, 2017.  I reviewed and summarized the referring note, she had a history of hysterectomy in 2003 cholecystectomy in 1999, long time smoker, still smoke half pack a day,  She was highly functional until June 2018, without clear triggers, she began to notice bilateral anterior thigh muscle achy pain, spasm, also noticed gradual onset gait abnormality, which has progressively worse over the past few months, recently she also noticed bilateral hands numbness tingling, clumsy of bilateral hand,  Initially she has mild low back pain, but no longer has significant low back pain, she denies neck pain, denies bowel bladder incontinence, she denies dysarthria, no diplopia no dysphagia.  I reviewed the laboratory evaluation in February 2019, CBC showed elevated WBC of 15.3, hemoglobin of 16.6, normal BMP, negative troponin, CPK was 25, CMP showed elevated alkaline phosphate 156, UA showed evidence of bacteria, normal TSH, negative HIV,  UPDATE March 28 2017: Patient is accompanied by her friend at today's clinical visit, she continue complains of slow decline gait abnormality, paresthesia achy pain of bilateral lower extremity.  MRI of cervical spine with without contrast at [facility] in March 2019  showed evidence of signal abnormality behind C3 and 4, with mild enhancement,  MRI of brain showed evidence of multiple area of abnormal T2/FLAIR hyperintensity signal in the white matter at both hemisphere predominantly periventricular, there was also T1 black holes, involving the lower pons, medulla, deep left cerebellar white matter, faint enhancement of at least one lesion in the left frontal lobe, unusual bulbous appearing of the anterior, inferior third ventricle in the region of the infundibular recess, with partial ringlike enhancement, hyper intense signal in the hypothalamus, right greater than left, possible he also optic asthma, right greater than left,  MRI of thoracic spine showed abnormal signal behind C7 T1-T2, also behind T6, T7, T8, some enhancement of the upper cord abnormality.  Imaging findings along with her history most consistent with relapsing remitting multiple sclerosis,   Laboratory evaluations in March 2019, vitamin D level was decreased 24, normal or negative NMO antibody were negative,Lyme titer, ANA, copper, ferritin level was mildly elevated 183, CPK, TSH, C-reactive protein, folic acid, A1c was 5.5, HIV, B12, RPR,  We had discussed extensive treatment options, with her aggressive clinical course, gait abnormality, significant abnormality on MRI of brain and spinal cord, will proceed with IV infusion, offered her the option of Tysarbri versus ocrelizumab  She also complains of significant fatigue,  UPDATE August 6th 2019: She is used to work as Building surveyor for hotel, 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, now she has significant gait abnormality, rely on her cane, also complains of low back pain, over the past few months we have tried Cymbalta, gabapentin, Lyrica, nortriptyline without helping her symptoms, she cannot tolerate NSAIDs due to GI symptoms, and  there was also documented anaphylactic reaction,  [place] since April 2019, last  infusion was on July 28, 2017, she tolerated very well, there was no significant side effect noticed, she continue have fatigue, taking Provigil 100 mg daily for her fatigue  Laboratory evaluation seen April 2019, showed normal or negative varicella-zoster IgG, hepatitis C, B antibody, quantiferonTB Gold antibody, vitamin D level is 24, JC virus antibody was decreased 0.29, Lyme titer was negative, NMO antibody was less than 1.5, ANA, copper, Ferritin 183, A1C 5.5, cpk, tsh, crp, folic acid, HIV, RPR, B12.  CSF showed more than 5 oligoclonal banding, with normal total protein 31  UPDATE Feb 18 2018: She is tolerating Tysabri infusion well, continue complains of gait abnormality, bilateral lower extremity deep achy pain, especially at nighttime, ambulate with a walker, fatigue has been helped by Nuvigil  Observations/Objective: I have reviewed problem lists, medications, allergies.  Last Tysabri IV infusion was on February 14, 2018, laboratory evaluation on February 18, 2018 showed significant abnormal liver functional test, alkaline phosphate 523, AST 387, ALT 297,  Repeat laboratory evaluations on March 06, 2018 showed significant improvement, alkaline phosphate 275, normal AST 15, ALT 21, patient reported that she has been taking frequent Tylenol 500 mg up to 8 tablets on a daily basis because of bilateral lower extremity deep achy pain,  For similar complaints, we have tried Cymbalta, gabapentin, nortriptyline, NSAIDs Lyrica without helping her symptoms  She complains of excessive drowsiness with baclofen, is no longer taking it,  I gave her a trial of Trileptal 150 mg twice a day March 2020, she denies significant improvement.   She has missed her March 2020 Tysabri infusion  I personally reviewed MRI of the brain with and without contrast on March 15, 2018: Bilateral scattered supratentorium, periventricular and subcortical white matter hyperintensity, solitary subtle linear enhancing  lesion in the right frontal just cortical region, presence of multiple T1 black holes, mild supratentorium cortical atrophy   Assessment and Plan: Relapsing remitting multiple sclerosis  Evidence of cervical, thoracic, involvement.  JC virus was negative with titer of 0.14 on February 18, 2018.  Tysabri since April 2019.  Last infusion was on February 14, 2018, it was put on hold due to abnormal liver functional test.  Will repeat liver functional test on April 11, 2018, if there is improvement, will resume monthly Tysabri infusion, the abnormal liver functional test is most likely due to her long-term large dose Tylenol use.  Gait abnormality, bilateral lower extremity deep achy pain  Try and failed different medications, including gabapentin, Lyrica, Cymbalta, NSAIDs, baclofen, Trileptal  Try tizanidine 4 mg 3 times daily as needed   Fatigue  Continue Nuvigil  Follow Up Instructions:  3 to 6 months    I discussed the assessment and treatment plan with the patient. The patient was provided an opportunity to ask questions and all were answered. The patient agreed with the plan and demonstrated an understanding of the instructions.   The patient was advised to call back or seek an in-person evaluation if the symptoms worsen or if the condition fails to improve as anticipated.  I provided 21 minutes of non-face-to-face time during this encounter.      Levert Feinstein, MD

## 2018-03-24 NOTE — Telephone Encounter (Signed)
Pt is asking for a call from [employer].  Pt states she wants to discuss one of her medications and she has a couple of questions re: her upcoming appointment

## 2018-03-26 ENCOUNTER — Encounter: Payer: Self-pay | Admitting: Neurology

## 2018-04-02 ENCOUNTER — Ambulatory Visit: Payer: Self-pay | Admitting: Neurology

## 2018-04-16 ENCOUNTER — Telehealth: Payer: Self-pay | Admitting: *Deleted

## 2018-04-16 ENCOUNTER — Telehealth: Payer: Self-pay | Admitting: Neurology

## 2018-04-16 DIAGNOSIS — G35 Multiple sclerosis: Secondary | ICD-10-CM

## 2018-04-16 NOTE — Telephone Encounter (Signed)
LFTs order was placed.

## 2018-04-16 NOTE — Telephone Encounter (Addendum)
Dr. Terrace Arabia would like this patient to come in for a repeat hepatic function panel.  She will make a decision on continuing her Tysabri infusions once labs are resulted.  I called the patient and left her a message requesting a return call.  I have also called [facility] [phone]) to provide them with this update.

## 2018-04-16 NOTE — Telephone Encounter (Signed)
The patient returned my call.  She will try to come the office for labs on 04/17/2018.  If not, she will come on 04/21/2018.

## 2018-04-21 ENCOUNTER — Other Ambulatory Visit: Payer: Self-pay

## 2018-04-21 ENCOUNTER — Other Ambulatory Visit (INDEPENDENT_AMBULATORY_CARE_PROVIDER_SITE_OTHER): Payer: Self-pay

## 2018-04-21 DIAGNOSIS — Z0289 Encounter for other administrative examinations: Secondary | ICD-10-CM

## 2018-04-21 DIAGNOSIS — M79604 Pain in right leg: Secondary | ICD-10-CM

## 2018-04-21 DIAGNOSIS — M79605 Pain in left leg: Secondary | ICD-10-CM | POA: Diagnosis not present

## 2018-04-21 DIAGNOSIS — G35 Multiple sclerosis: Secondary | ICD-10-CM

## 2018-04-21 DIAGNOSIS — R5383 Other fatigue: Secondary | ICD-10-CM

## 2018-04-22 ENCOUNTER — Telehealth: Payer: Self-pay | Admitting: Neurology

## 2018-04-22 LAB — COMPREHENSIVE METABOLIC PANEL
ALT: 6 IU/L (ref 0–32)
AST: 12 IU/L (ref 0–40)
Albumin/Globulin Ratio: 2.1 (ref 1.2–2.2)
Albumin: 4.1 g/dL (ref 3.8–4.9)
Alkaline Phosphatase: 173 IU/L — ABNORMAL HIGH (ref 39–117)
BUN/Creatinine Ratio: 18 (ref 9–23)
BUN: 15 mg/dL (ref 6–24)
Bilirubin Total: <0.2 mg/dL (ref 0.0–1.2)
CO2: 29 mmol/L (ref 20–29)
Calcium: 9 mg/dL (ref 8.7–10.2)
Chloride: 101 mmol/L (ref 96–106)
Creatinine, Ser: 0.85 mg/dL (ref 0.57–1.00)
GFR calc Af Amer: 90 mL/min/{1.73_m2} (ref >59)
GFR calc non Af Amer: 78 mL/min/{1.73_m2} (ref >59)
Globulin, Total: 2 g/dL (ref 1.5–4.5)
Glucose: 89 mg/dL (ref 65–99)
Potassium: 3.8 mmol/L (ref 3.5–5.2)
Sodium: 146 mmol/L — ABNORMAL HIGH (ref 134–144)
Total Protein: 6.1 g/dL (ref 6.0–8.5)

## 2018-04-22 LAB — CBC WITH DIFFERENTIAL/PLATELET
Basophils Absolute: 0.1 10*3/uL (ref 0.0–0.2)
Basos: 1 %
EOS (ABSOLUTE): 0.1 10*3/uL (ref 0.0–0.4)
Eos: 1 %
Hematocrit: 39.2 % (ref 34.0–46.6)
Hemoglobin: 14.1 g/dL (ref 11.1–15.9)
Immature Grans (Abs): 0 10*3/uL (ref 0.0–0.1)
Immature Granulocytes: 0 %
Lymphocytes Absolute: 3.7 10*3/uL — ABNORMAL HIGH (ref 0.7–3.1)
Lymphs: 37 %
MCH: 33.1 pg — ABNORMAL HIGH (ref 26.6–33.0)
MCHC: 36 g/dL — ABNORMAL HIGH (ref 31.5–35.7)
MCV: 92 fL (ref 79–97)
Monocytes Absolute: 0.7 10*3/uL (ref 0.1–0.9)
Monocytes: 7 %
Neutrophils Absolute: 5.5 10*3/uL (ref 1.4–7.0)
Neutrophils: 54 %
Platelets: 192 10*3/uL (ref 150–450)
RBC: 4.26 x10E6/uL (ref 3.77–5.28)
RDW: 13.1 % (ref 11.7–15.4)
WBC: 10.2 10*3/uL (ref 3.4–10.8)

## 2018-04-22 LAB — HEPATIC FUNCTION PANEL: Bilirubin, Direct: 0.06 mg/dL (ref 0.00–0.40)

## 2018-04-22 NOTE — Telephone Encounter (Signed)
Please call patient, laboratory evaluations were normal. May resume Tysarbri infusion.

## 2018-04-22 NOTE — Telephone Encounter (Signed)
Left patient a message letting her know the information below and to expect a call from [facility] to get her Tysabri scheduled.  Provided our call back number in case she has questions.

## 2018-05-01 DIAGNOSIS — G35 Multiple sclerosis: Secondary | ICD-10-CM | POA: Diagnosis not present

## 2018-05-05 ENCOUNTER — Encounter: Payer: Self-pay | Admitting: Physical Medicine & Rehabilitation

## 2018-05-19 ENCOUNTER — Ambulatory Visit: Payer: BLUE CROSS/BLUE SHIELD | Admitting: Neurology

## 2018-05-21 DIAGNOSIS — Z0271 Encounter for disability determination: Secondary | ICD-10-CM

## 2018-05-29 DIAGNOSIS — G35 Multiple sclerosis: Secondary | ICD-10-CM | POA: Diagnosis not present

## 2018-05-30 ENCOUNTER — Encounter
Payer: BLUE CROSS/BLUE SHIELD | Attending: Physical Medicine & Rehabilitation | Admitting: Physical Medicine & Rehabilitation

## 2018-05-30 ENCOUNTER — Other Ambulatory Visit: Payer: Self-pay

## 2018-05-30 ENCOUNTER — Encounter: Payer: Self-pay | Admitting: Physical Medicine & Rehabilitation

## 2018-05-30 VITALS — BP 116/76 | HR 79 | Temp 98.3°F | Ht 62.0 in | Wt 97.6 lb

## 2018-05-30 DIAGNOSIS — R202 Paresthesia of skin: Secondary | ICD-10-CM | POA: Diagnosis not present

## 2018-05-30 DIAGNOSIS — M79605 Pain in left leg: Secondary | ICD-10-CM | POA: Diagnosis not present

## 2018-05-30 DIAGNOSIS — M791 Myalgia, unspecified site: Secondary | ICD-10-CM | POA: Diagnosis not present

## 2018-05-30 DIAGNOSIS — R269 Unspecified abnormalities of gait and mobility: Secondary | ICD-10-CM

## 2018-05-30 DIAGNOSIS — M79604 Pain in right leg: Secondary | ICD-10-CM

## 2018-05-30 DIAGNOSIS — Z720 Tobacco use: Secondary | ICD-10-CM | POA: Diagnosis not present

## 2018-05-30 DIAGNOSIS — G35 Multiple sclerosis: Secondary | ICD-10-CM | POA: Diagnosis not present

## 2018-05-30 MED ORDER — METHOCARBAMOL 500 MG PO TABS: 500.0000 mg | ORAL_TABLET | Freq: Two times a day (BID) | ORAL | 1 refills | Status: DC | PRN

## 2018-05-30 MED ORDER — DULOXETINE HCL 30 MG PO CPEP: 30.0000 mg | ORAL_CAPSULE | Freq: Every day | ORAL | 1 refills | Status: DC

## 2018-05-30 NOTE — Progress Notes (Signed)
Subjective:    Patient ID: Gabriela Campbell, female    DOB: [birth year], 53 y.o.   MRN: [id number]  HPI Female with pmh of shingles, MS presents with b/l leg pain. Started in 05/2017.  Denies inciting factors, however, she was diagnosed with MS in 03/2017.  Getting progressively worse. Predominantly in thighs and calves.  Denies alleviating factors.  Tried heat/cold, muscle rub. Denies exacerbating factors. "Just hurts".  Non-radiating. Constant.  Associated numbness and weakness.  Cannot take tylenol due to elevations with LFTs. Could not tolerate Baclofen.  Ambulated with rollator.  Has had falls with legs "giving out".  Pain limits ADLs.   Pain Inventory Average Pain 9 Pain Right Now 10 My pain is sharp, tingling and aching  In the last 24 hours, has pain interfered with the following? General activity 9 Relation with others 9 Enjoyment of life 9 What TIME of day is your pain at its worst? morning and night Sleep (in general) Fair  Pain is worse with: unsure Pain improves with: none Relief from Meds: none  Mobility walk with assistance use a cane use a walker how many minutes can you walk? 2 ability to climb steps?  no do you drive?  no Do you have any goals in this area?  yes  Function not employed: date last employed June 2018  Neuro/Psych weakness numbness tingling trouble walking spasms  Prior Studies CT/MRI  Physicians involved in your care Neurologist Dr. Thurnell LoseYow   No family history on file. Social History   Socioeconomic History  . Marital status: Married    Spouse name: Not on file  . Number of children: 0  . Years of education: college  . Highest education level: Not on file  Occupational History  . Occupation: Unemployed  Social Needs  . Financial resource strain: Not on file  . Food insecurity:    Worry: Not on file    Inability: Not on file  . Transportation needs:    Medical: Not on file    Non-medical: Not on file  Tobacco Use  .  Smoking status: Current Every Day Smoker    Packs/day: 0.50    Types: Cigarettes  . Smokeless tobacco: Never Used  Substance and Sexual Activity  . Alcohol use: No  . Drug use: No  . Sexual activity: Not on file  Lifestyle  . Physical activity:    Days per week: Not on file    Minutes per session: Not on file  . Stress: Not on file  Relationships  . Social connections:    Talks on phone: Not on file    Gets together: Not on file    Attends religious service: Not on file    Active member of club or organization: Not on file    Attends meetings of clubs or organizations: Not on file    Relationship status: Not on file  Other Topics Concern  . Not on file  Social History Narrative   Lives at home with husband.   Caffeine use:  1-2 cups daily.   Right-handed.   Past Surgical History:  Procedure Laterality Date  . ABDOMINAL HYSTERECTOMY    . CHOLECYSTECTOMY     Past Medical History:  Diagnosis Date  . Shingles   . Weakness    BP 116/76   Pulse 79   Temp 98.3 F (36.8 C)   Ht 5\' 2"  (1.575 m)   Wt 97 lb 9.6 oz (44.3 kg)   SpO2 99%   BMI  17.85 kg/m   Opioid Risk Score:   Fall Risk Score:  `1  Depression screen PHQ 2/9  Depression screen [facility] 2/9 05/30/2018 10/10/2016  Decreased Interest 1 0  Down, Depressed, Hopeless 1 0  PHQ - 2 Score 2 0  Altered sleeping 3 -  Tired, decreased energy 1 -  Change in appetite 0 -  Feeling bad or failure about yourself  0 -  Trouble concentrating 0 -  Moving slowly or fidgety/restless 0 -  Suicidal thoughts 0 -  PHQ-9 Score 6 -  Difficult doing work/chores Somewhat difficult -    Review of Systems  Constitutional: Negative.   HENT: Negative.   Eyes: Negative.   Respiratory: Negative.   Cardiovascular: Negative.   Gastrointestinal: Negative.   Endocrine: Negative.   Genitourinary: Negative.   Musculoskeletal: Positive for gait problem.       Spasms  Skin: Negative.   Allergic/Immunologic: Negative.   Neurological:  Positive for weakness and numbness.       Tingling  Hematological: Negative.   Psychiatric/Behavioral: Negative.   All other systems reviewed and are negative.      Objective:   Physical Exam Gen: NAD. Vital signs reviewed HENT: Normocephalic, Atraumatic Eyes: EOMI. No discharge.  Cardio: No JVD. Pulm: Effort normal Abd: Nondistended MSK:   Gait: Antalgic with slow cadence  TTP b/l thighs and calves.    No edema.  Neuro: CN II-XII grossly intact.    Sensation diminished to light touch in thighs and toes  Strength  4/5 in all LE myotomes Skin: Warm and Dry. Intact    Assessment & Plan:  Female with pmh of shingles, MS presents with b/l leg pain.  1. Chronic LE pain  Multifactorial - Neuropathic, muscular, spasticity  MRI showing lesions consistent with MS  Labs reviewed  SE with Baclofen  No benefit heat/cold  Referral information reviewed  Will order PT with trial TENS  Will consider Voltaren gel/Lidoderm  Will consider Gabapentin, she states ineffective, but does not recall dose  Will order Cymbalta 30mg  qhs, she states ineffective, but does not recall dose  Will order Robaxin 500 BID  Will consider Referral to Psychology  Will consider compression stockings  Patient states main goal is "do everything"   2. Gait abnormality  Cont rollator  3. Sleep disturbance  Will consider Elavil 5mg  qhs  See #1  4. Myalgia   Will consider trigger point injections  5. Tobacco abuse  Using 1/2 PPD  Encouraged abstinence

## 2018-06-26 DIAGNOSIS — G35 Multiple sclerosis: Secondary | ICD-10-CM | POA: Diagnosis not present

## 2018-06-27 ENCOUNTER — Encounter: Payer: BLUE CROSS/BLUE SHIELD | Admitting: Physical Medicine & Rehabilitation

## 2018-06-29 ENCOUNTER — Other Ambulatory Visit: Payer: Self-pay | Admitting: Diagnostic Neuroimaging

## 2018-07-24 DIAGNOSIS — G35 Multiple sclerosis: Secondary | ICD-10-CM | POA: Diagnosis not present

## 2018-08-25 DIAGNOSIS — G35 Multiple sclerosis: Secondary | ICD-10-CM | POA: Diagnosis not present

## 2018-09-18 DIAGNOSIS — G35 Multiple sclerosis: Secondary | ICD-10-CM | POA: Diagnosis not present

## 2018-10-08 ENCOUNTER — Telehealth: Payer: Self-pay | Admitting: Neurology

## 2018-10-08 NOTE — Telephone Encounter (Signed)
Pt called stating that she has been nauseated and is wanting to know if something can be called in for her. Pt was asked if she had reached out to her PCP and she stated she had not. Please advise.

## 2018-10-08 NOTE — Telephone Encounter (Signed)
Called patient to discuss; she denied any other symptoms. I advised she call her PCP to discuss. She stated she would have to find a ride to go in. I advised she can talk to nurse and maybe a medicine can be called in. Also advised her there are several OTC medicines for nausea. She stated she would call someone to get something at drug store to try. I advised she call PCP if she can't get relief. Patient verbalized understanding, appreciation.

## 2018-10-16 ENCOUNTER — Other Ambulatory Visit: Payer: Self-pay | Admitting: *Deleted

## 2018-10-16 DIAGNOSIS — Z7253 High risk bisexual behavior: Secondary | ICD-10-CM

## 2018-10-16 DIAGNOSIS — G35 Multiple sclerosis: Secondary | ICD-10-CM | POA: Diagnosis not present

## 2018-10-16 NOTE — Addendum Note (Signed)
Addended by: Inis Sizer D on: 10/16/2018 03:46 PM   Modules accepted: Orders

## 2018-10-16 NOTE — Progress Notes (Signed)
Pt here for Tysabri infusion today. She is due for JCV/CBC w/ diff/platelet. I placed order. Liane, RN in infusion suite aware pt needs labs.

## 2018-10-17 LAB — CBC WITH DIFFERENTIAL/PLATELET
Basophils Absolute: 0.1 10*3/uL (ref 0.0–0.2)
Basos: 1 %
EOS (ABSOLUTE): 0.1 10*3/uL (ref 0.0–0.4)
Eos: 1 %
Hematocrit: 44 % (ref 34.0–46.6)
Hemoglobin: 15.3 g/dL (ref 11.1–15.9)
Immature Grans (Abs): 0 10*3/uL (ref 0.0–0.1)
Immature Granulocytes: 0 %
Lymphocytes Absolute: 5.3 10*3/uL — ABNORMAL HIGH (ref 0.7–3.1)
Lymphs: 39 %
MCH: 32.1 pg (ref 26.6–33.0)
MCHC: 34.8 g/dL (ref 31.5–35.7)
MCV: 92 fL (ref 79–97)
Monocytes Absolute: 0.8 10*3/uL (ref 0.1–0.9)
Monocytes: 6 %
Neutrophils Absolute: 7.4 10*3/uL — ABNORMAL HIGH (ref 1.4–7.0)
Neutrophils: 53 %
Platelets: 192 10*3/uL (ref 150–450)
RBC: 4.77 x10E6/uL (ref 3.77–5.28)
RDW: 12.7 % (ref 11.7–15.4)
WBC: 13.8 10*3/uL — ABNORMAL HIGH (ref 3.4–10.8)

## 2018-10-20 ENCOUNTER — Telehealth: Payer: Self-pay | Admitting: Neurology

## 2018-10-20 NOTE — Telephone Encounter (Signed)
Reports having cough, runny nose and congestion for the last week.  She is aware of her lab results.  She was in agreement to contact her PCP today for further evaluation of symptoms.

## 2018-10-20 NOTE — Telephone Encounter (Signed)
Please call patient, lab evaluation showed elevated WBC 13.8,  Please call her to make sure that she does not have signs of infection, UTI, URI,

## 2018-10-27 ENCOUNTER — Telehealth: Payer: Self-pay | Admitting: *Deleted

## 2018-10-27 NOTE — Telephone Encounter (Signed)
Labs collected on 10/16/2018.  JCV ab 0.21 Indeterminate  Inhibition Assay Negative

## 2018-11-12 ENCOUNTER — Other Ambulatory Visit: Payer: Self-pay | Admitting: Neurology

## 2018-11-13 DIAGNOSIS — G35 Multiple sclerosis: Secondary | ICD-10-CM | POA: Diagnosis not present

## 2018-12-04 ENCOUNTER — Encounter: Payer: Self-pay | Admitting: Emergency Medicine

## 2018-12-04 ENCOUNTER — Telehealth: Payer: Self-pay | Admitting: *Deleted

## 2018-12-04 ENCOUNTER — Other Ambulatory Visit: Payer: Self-pay

## 2018-12-04 ENCOUNTER — Ambulatory Visit (INDEPENDENT_AMBULATORY_CARE_PROVIDER_SITE_OTHER): Payer: BLUE CROSS/BLUE SHIELD

## 2018-12-04 ENCOUNTER — Ambulatory Visit: Payer: BLUE CROSS/BLUE SHIELD | Admitting: Emergency Medicine

## 2018-12-04 VITALS — BP 79/49 | HR 77 | Temp 98.7°F | Resp 16 | Ht 62.0 in | Wt 97.6 lb

## 2018-12-04 DIAGNOSIS — G35 Multiple sclerosis: Secondary | ICD-10-CM | POA: Diagnosis not present

## 2018-12-04 DIAGNOSIS — W19XXXA Unspecified fall, initial encounter: Secondary | ICD-10-CM

## 2018-12-04 DIAGNOSIS — S2241XA Multiple fractures of ribs, right side, initial encounter for closed fracture: Secondary | ICD-10-CM | POA: Diagnosis not present

## 2018-12-04 DIAGNOSIS — S20211A Contusion of right front wall of thorax, initial encounter: Secondary | ICD-10-CM | POA: Diagnosis not present

## 2018-12-04 DIAGNOSIS — R0781 Pleurodynia: Secondary | ICD-10-CM

## 2018-12-04 IMAGING — DX DG RIBS W/ CHEST 3+V*R*
4 series · 4 of 4 positions shown · non-contrast
Comparison: Chest x-ray [DATE]

CLINICAL DATA: Right rib contusions.

EXAM:
RIGHT RIBS AND CHEST - 3+ VIEW

[chest pa]
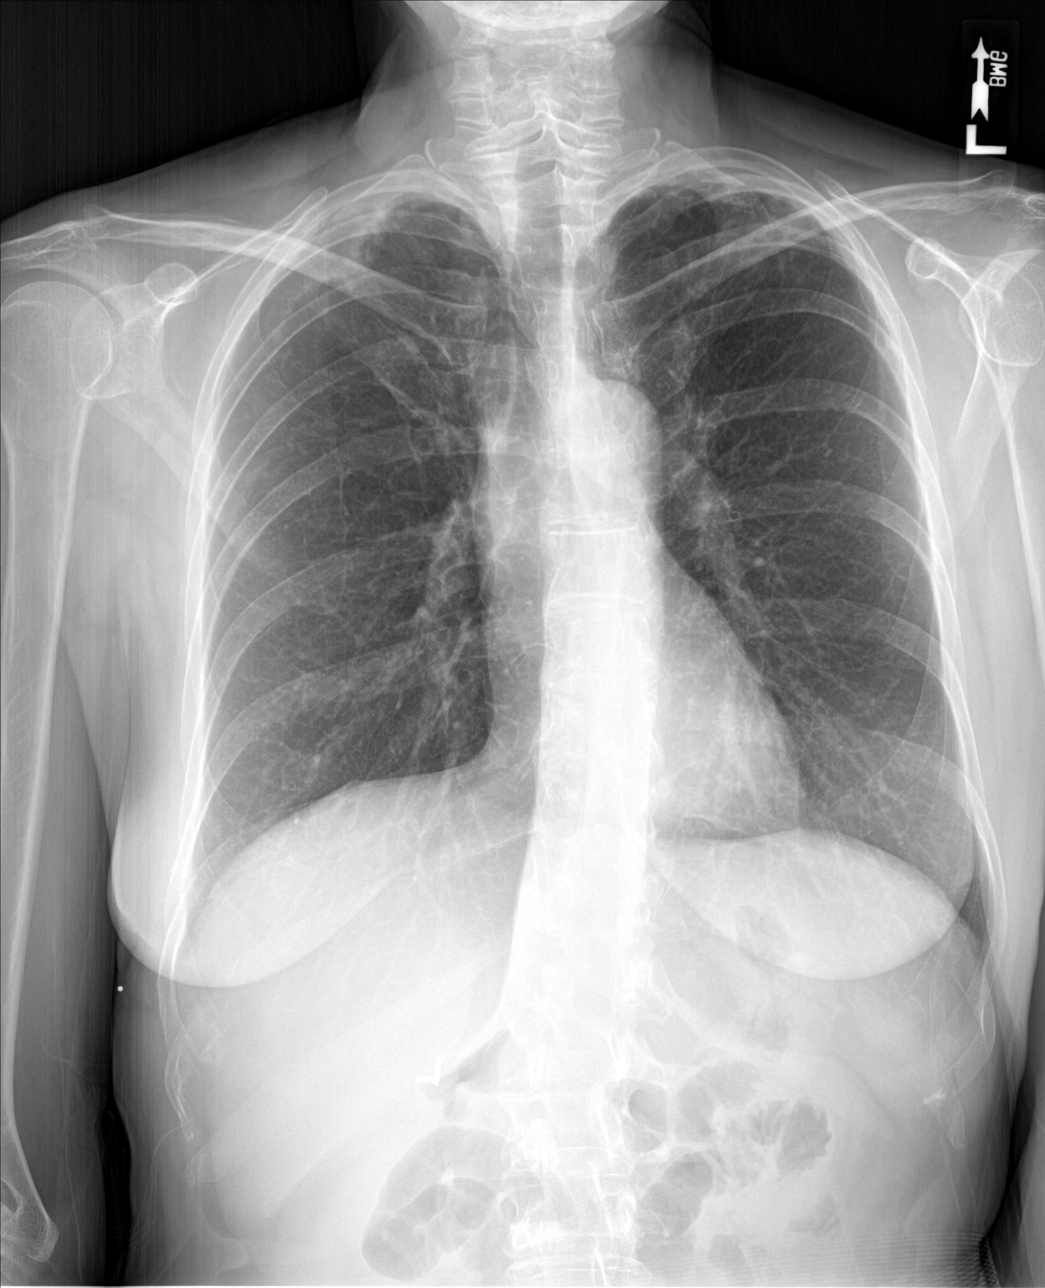

[rib obl (1 of 2)]
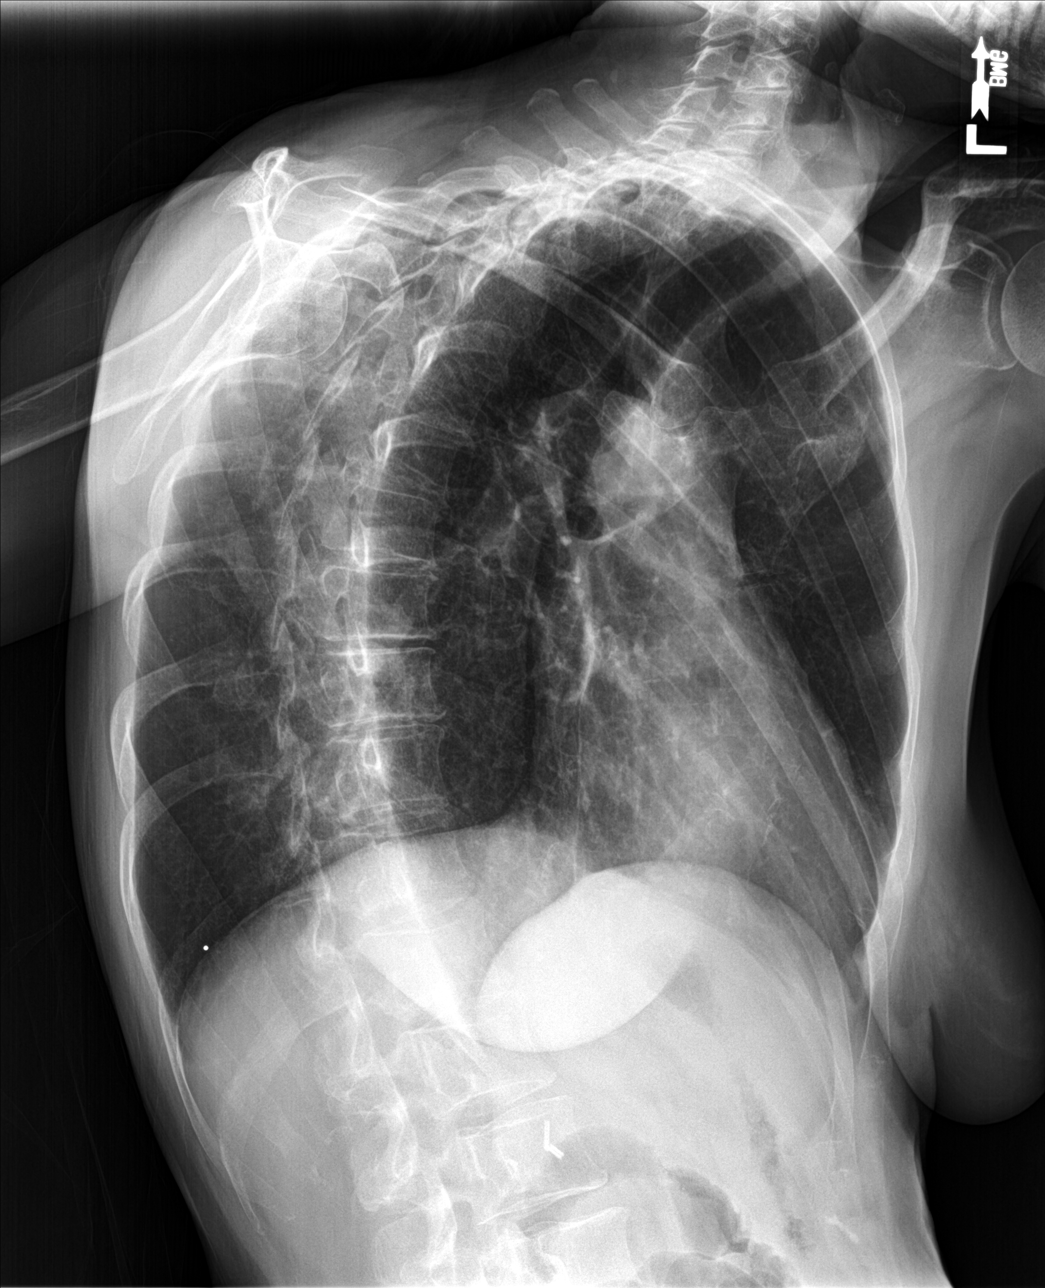

[rib obl (2 of 2)]
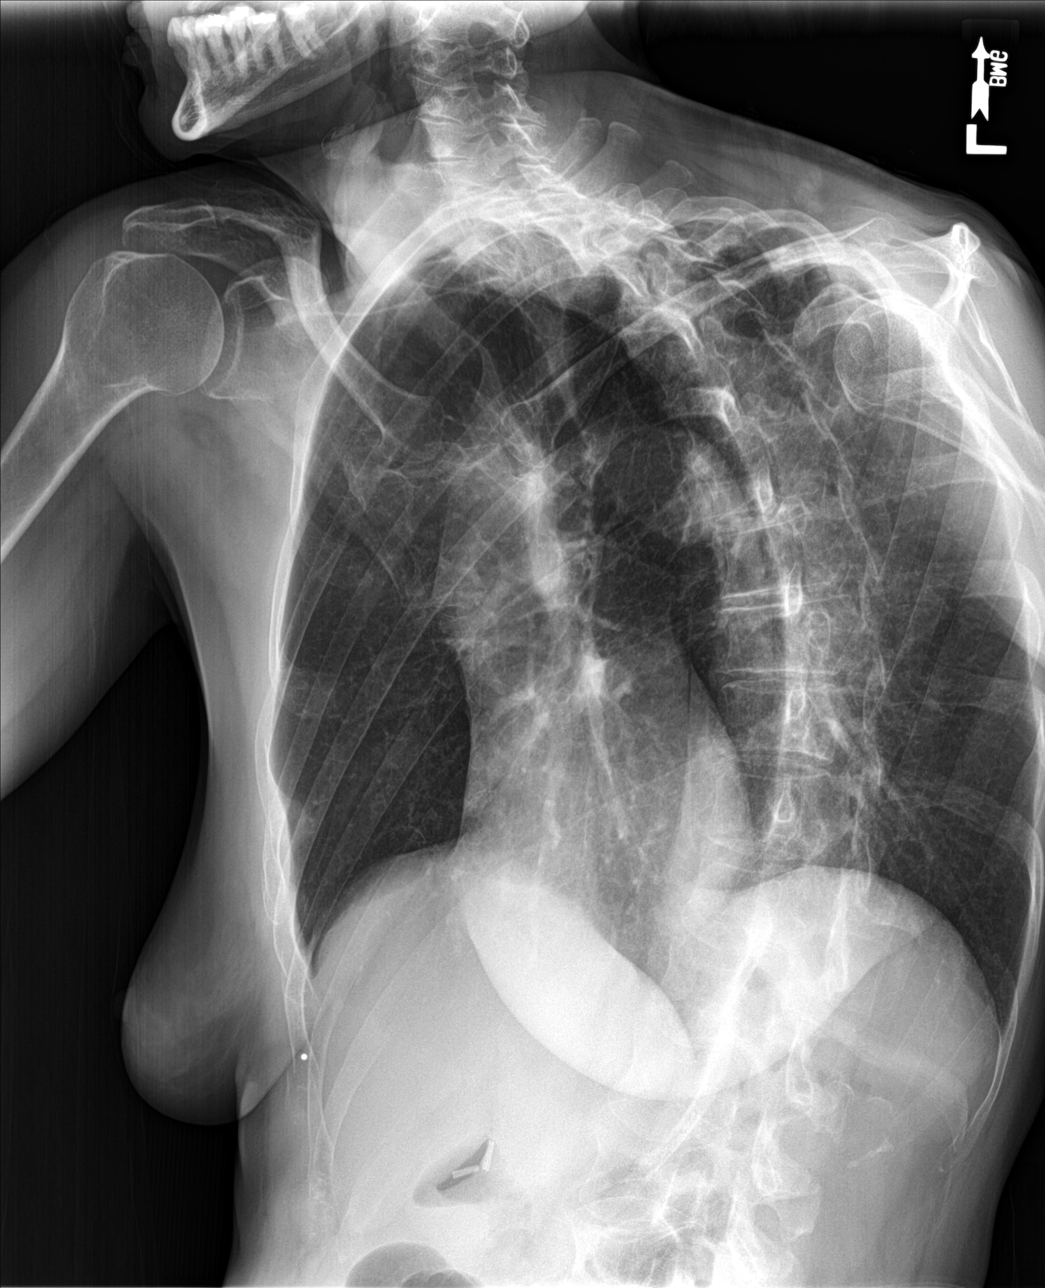

[chest ap]
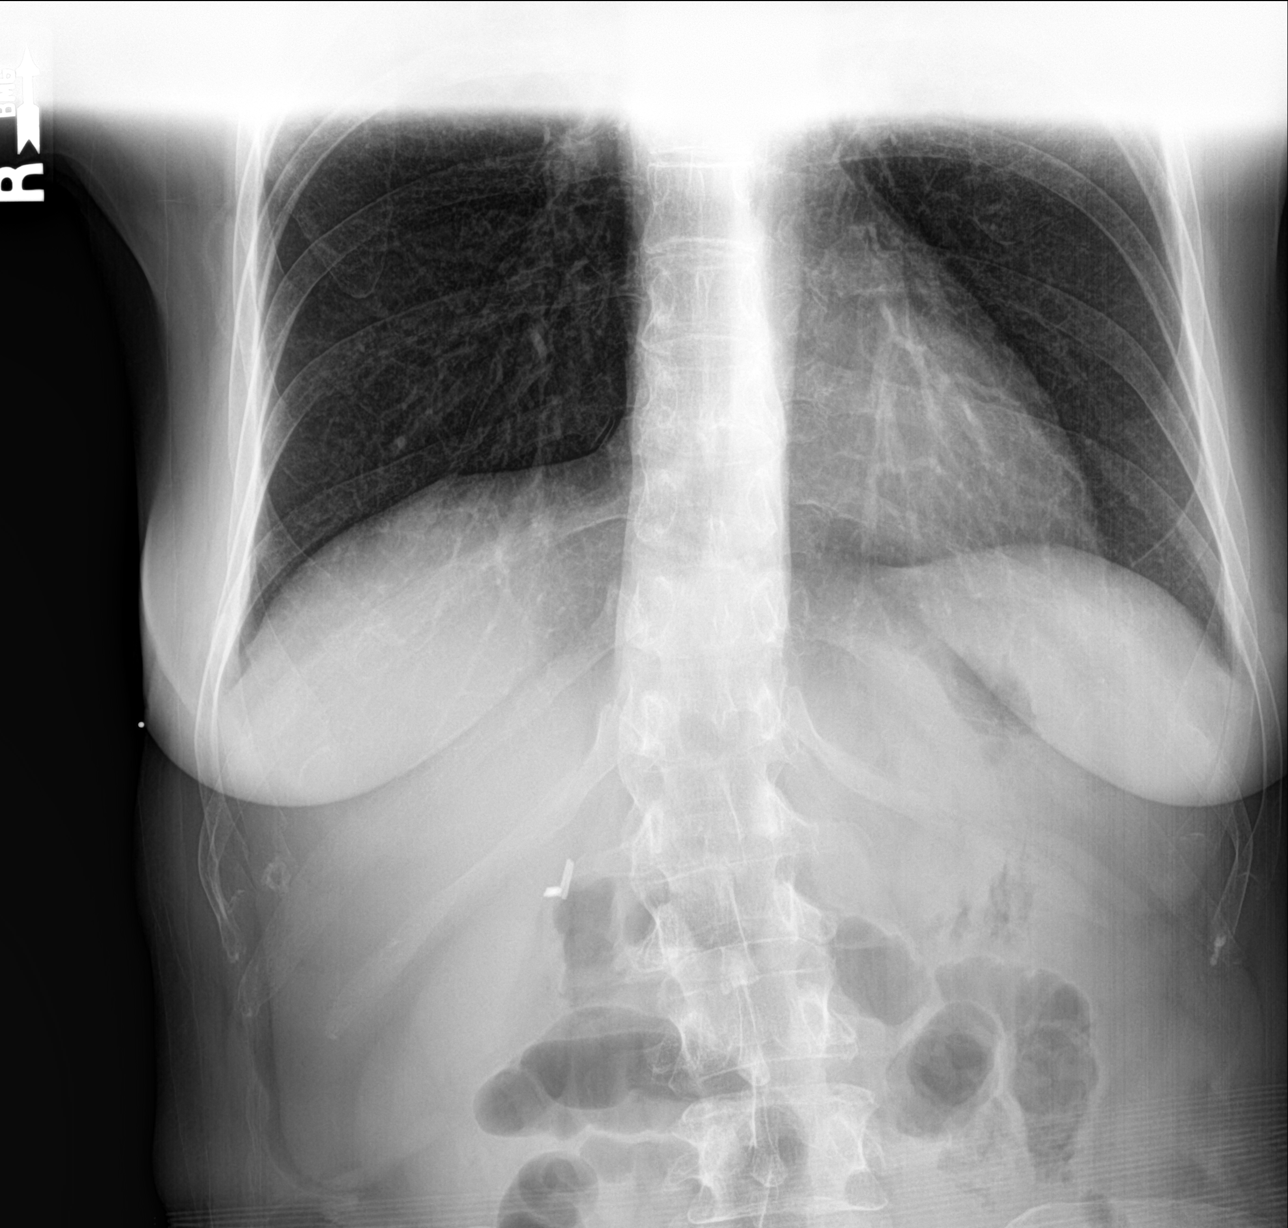

[4 of 4 positions shown; findings below may reference images not displayed]

FINDINGS: The cardiac silhouette, mediastinal and hilar contours are normal.
The lungs are clear of an acute process. No worrisome pulmonary
lesions, pneumothorax or pleural effusion. Stable biapical pleural
and parenchymal scarring changes, likely related to prior
granulomatous disease.

There are minimally displaced fractures involving the seventh,
eighth and ninth anterior ribs.
IMPRESSION: 1. Minimally displaced fractures involving the anterior seventh,
eighth and ninth ribs.
2. No acute cardiopulmonary findings.
3. Stable biapical scarring changes.

## 2018-12-04 MED ORDER — HYDROCODONE-ACETAMINOPHEN 5-325 MG PO TABS: 1.0000 | ORAL_TABLET | Freq: Four times a day (QID) | ORAL | 0 refills | Status: DC | PRN

## 2018-12-04 MED ORDER — TRAMADOL HCL 50 MG PO TABS: 50.0000 mg | ORAL_TABLET | Freq: Three times a day (TID) | ORAL | 0 refills | Status: DC | PRN

## 2018-12-04 NOTE — Patient Instructions (Addendum)
If you have lab work done today you will be contacted with your lab results within the next 2 weeks.  If you have not heard from [place] then please contact us. The fastest way to get your results is to register for My Chart.   IF you received an x-ray today, you will receive an invoice from [facility]. Please contact [facility] at [phone] with questions or concerns regarding your invoice.   IF you received labwork today, you will receive an invoice from [facility]. Please contact [facility] at [phone] with questions or concerns regarding your invoice.   Our billing staff will not be able to assist you with questions regarding bills from these companies.  You will be contacted with the lab results as soon as they are available. The fastest way to get your results is to activate your My Chart account. Instructions are located on the last page of this paperwork. If you have not heard from [place] regarding the results in 2 weeks, please contact this office.    We recommend that you schedule a mammogram for breast cancer screening. Typically, you do not need a referral to do this. Please contact a local imaging center to schedule your mammogram.  [facility] - [phone]  *ask for the Radiology Department [facility] [place] [facility]) - [phone] or [phone]  [facility] - [phone] [place] [phone] [facility] - [phone]  *ask for the [facility] - [phone]  *ask for the Radiology Department [facility] - [phone]  *ask for the [place] - [phone] Rib Fracture  A rib fracture is a break or crack in one of the bones of the ribs. The ribs are like a cage that goes around your upper chest. A broken or cracked rib is often painful, but most do not cause other problems. Most rib fractures usually  heal on their own in 1-3 months. Follow these instructions at home: Managing pain, stiffness, and swelling  If directed, apply ice to the injured area. ? Put ice in a plastic bag. ? Place a towel between your skin and the bag. ? Leave the ice on for 20 minutes, 2-3 times a day.  Take over-the-counter and prescription medicines only as told by your doctor. Activity  Avoid activities that cause pain to the injured area. Protect your injured area.  Slowly increase activity as told by your doctor. General instructions  Do deep breathing as told by your doctor. You may be told to: ? Take deep breaths many times a day. ? Cough many times a day while hugging a pillow. ? Use a device (incentive spirometer) to do deep breathing many times a day.  Drink enough fluid to keep your pee (urine) clear or pale yellow.  Do not wear a rib belt or binder. These do not allow you to breathe deeply.  Keep all follow-up visits as told by your doctor. This is important. Contact a doctor if:  You have a fever. Get help right away if:  You have trouble breathing.  You are short of breath.  You cannot stop coughing.  You cough up thick or bloody spit (sputum).  You feel sick to your stomach (nauseous), throw up (vomit), or have belly (abdominal) pain.  Your pain gets worse and medicine does not help. Summary  A rib fracture is a break or crack in one of  the bones of the ribs.  Apply ice to the injured area and take medicines for pain as told by your doctor.  Take deep breaths and cough many times a day. Hug a pillow every time you cough. This information is not intended to replace advice given to you by your health care provider. Make sure you discuss any questions you have with your health care provider. Document Released: 09/27/2007 Document Revised: 11/30/2016 Document Reviewed: 03/20/2016 Elsevier Patient Education  2020 [employer].

## 2018-12-04 NOTE — Telephone Encounter (Signed)
Spoke to the pharmacist Imonia to cancel Tramadol because patient has an intolerance. Dr Mitchel Honour will e-scribe Hydrocodone-Acetam instead. Patient advised.

## 2018-12-04 NOTE — Progress Notes (Signed)
Gabriela Campbell 53 y.o.   Chief Complaint  Patient presents with  . Fall    x 1 week - injury to RIGHT side with pain    HISTORY OF PRESENT ILLNESS: This is a 53 y.o. female with history of multiple sclerosis and chronic bilateral leg weakness with gait disturbance fell 1 week ago after losing her balance and injured right side of her chest wall complaining of pain since.  Denies difficulty breathing.  Patient is a smoker with chronic smoker's cough.  Denies hemoptysis or any other significant symptoms.  HPI   Prior to Admission medications   Medication Sig Start Date End Date Taking? Authorizing Provider  Armodafinil 150 MG tablet TAKE 1 TABLET(150 MG) BY MOUTH DAILY 11/12/18  Yes Levert Feinstein, MD  DULoxetine (CYMBALTA) 30 MG capsule Take 1 capsule (30 mg total) by mouth daily. Patient not taking: Reported on 12/04/2018 05/30/18   Marcello Fennel, MD  methocarbamol (ROBAXIN) 500 MG tablet Take 1 tablet (500 mg total) by mouth 2 (two) times daily as needed for muscle spasms. Patient not taking: Reported on 12/04/2018 05/30/18   Marcello Fennel, MD    Allergies  Allergen Reactions  . Dilaudid [Hydromorphone Hcl] Anaphylaxis  . Ibuprofen Anaphylaxis  . Iodine Anaphylaxis  . Penicillins Anaphylaxis    Has patient had a PCN reaction causing immediate rash, facial/tongue/throat swelling, SOB or lightheadedness with hypotension:yes Has patient had a PCN reaction causing severe rash involving mucus membranes or skin necrosis: no Has patient had a PCN reaction that required hospitalization no Has patient had a PCN reaction occurring within the last 10 years: no If all of the above answers are "NO", then may proceed with Cephalosporin use.   . Gabapentin Other (See Comments)    GI upset  . Codeine Nausea Only  . Tramadol Nausea And Vomiting    Patient Active Problem List   Diagnosis Date Noted  . Vitamin D deficiency 02/18/2018  . Multiple sclerosis (HCC) 03/28/2017  . Gait  abnormality 03/14/2017  . Smoker 10/10/2016  . Leg weakness, bilateral 10/10/2016    Past Medical History:  Diagnosis Date  . Shingles   . Weakness     Past Surgical History:  Procedure Laterality Date  . ABDOMINAL HYSTERECTOMY    . CHOLECYSTECTOMY      Social History   Socioeconomic History  . Marital status: Married    Spouse name: Not on file  . Number of children: 0  . Years of education: college  . Highest education level: Not on file  Occupational History  . Occupation: Unemployed  Social Needs  . Financial resource strain: Not on file  . Food insecurity    Worry: Not on file    Inability: Not on file  . Transportation needs    Medical: Not on file    Non-medical: Not on file  Tobacco Use  . Smoking status: Current Every Day Smoker    Packs/day: 0.50    Types: Cigarettes  . Smokeless tobacco: Never Used  Substance and Sexual Activity  . Alcohol use: No  . Drug use: No  . Sexual activity: Not on file  Lifestyle  . Physical activity    Days per week: Not on file    Minutes per session: Not on file  . Stress: Not on file  Relationships  . Social Musician on phone: Not on file    Gets together: Not on file    Attends religious service: Not  on file    Active member of club or organization: Not on file    Attends meetings of clubs or organizations: Not on file    Relationship status: Not on file  . Intimate partner violence    Fear of current or ex partner: Not on file    Emotionally abused: Not on file    Physically abused: Not on file    Forced sexual activity: Not on file  Other Topics Concern  . Not on file  Social History Narrative   Lives at home with husband.   Caffeine use:  1-2 cups daily.   Right-handed.    History reviewed. No pertinent family history.   Review of Systems  Constitutional: Positive for weight loss. Negative for chills and fever.  HENT: Negative for congestion and sore throat.   Respiratory: Positive for  cough (Chronic). Negative for hemoptysis, sputum production, shortness of breath and wheezing.   Cardiovascular: Negative for chest pain and palpitations.  Gastrointestinal: Negative for abdominal pain, diarrhea, nausea and vomiting.  Genitourinary: Negative.   Musculoskeletal: Positive for back pain.  Skin: Negative.  Negative for rash.  Neurological: Negative for dizziness and headaches.  All other systems reviewed and are negative.   Vitals:   12/04/18 0844  BP: (!) 79/49  Pulse: 77  Resp: 16  Temp: 98.7 F (37.1 C)  SpO2: 98%    Physical Exam Constitutional:      Comments: Frail looking  HENT:     Head: Normocephalic.  Eyes:     Extraocular Movements: Extraocular movements intact.     Pupils: Pupils are equal, round, and reactive to light.  Neck:     Musculoskeletal: Normal range of motion and neck supple.  Cardiovascular:     Rate and Rhythm: Normal rate and regular rhythm.     Heart sounds: Normal heart sounds.  Pulmonary:     Effort: Pulmonary effort is normal.     Breath sounds: Normal breath sounds.  Chest:     Chest wall: Tenderness (Right lower lateral rib cage) present.  Skin:    General: Skin is warm and dry.     Capillary Refill: Capillary refill takes less than 2 seconds.     Comments: No ecchymosis to area of injury.  Neurological:     General: No focal deficit present.     Mental Status: She is alert and oriented to person, place, and time.  Psychiatric:        Mood and Affect: Mood normal.        Behavior: Behavior normal.    Dg Ribs Unilateral W/chest Right  Result Date: 12/04/2018 CLINICAL DATA:  Right rib contusions. EXAM: RIGHT RIBS AND CHEST - 3+ VIEW COMPARISON:  Chest x-ray 05/16/2016 FINDINGS: The cardiac silhouette, mediastinal and hilar contours are normal. The lungs are clear of an acute process. No worrisome pulmonary lesions, pneumothorax or pleural effusion. Stable biapical pleural and parenchymal scarring changes, likely related to  prior granulomatous disease. There are minimally displaced fractures involving the seventh, eighth and ninth anterior ribs. IMPRESSION: 1. Minimally displaced fractures involving the anterior seventh, eighth and ninth ribs. 2. No acute cardiopulmonary findings. 3. Stable biapical scarring changes. Electronically Signed   By: Marijo Sanes M.D.   On: 12/04/2018 09:25     ASSESSMENT & PLAN: Phyllistine was seen today for fall.  Diagnoses and all orders for this visit:  Closed fracture of multiple ribs of right side, initial encounter  Rib contusion, right, initial encounter -  DG Ribs Unilateral W/Chest Right; Future  Multiple sclerosis (HCC)  Accidental fall, initial encounter  Rib pain on right side -     traMADol (ULTRAM) 50 MG tablet; Take 1 tablet (50 mg total) by mouth every 8 (eight) hours as needed for up to 5 days for severe pain.    Patient Instructions       If you have lab work done today you will be contacted with your lab results within the next 2 weeks.  If you have not heard from [place] then please contact us. The fastest way to get your results is to register for My Chart.   IF you received an x-ray today, you will receive an invoice from [facility]iology. Please contact [facility] Radiology at [phone] with questions or concerns regarding your invoice.   IF you received labwork today, you will receive an invoice from [facility]. Please contact [facility] at [phone] with questions or concerns regarding your invoice.   Our billing staff will not be able to assist you with questions regarding bills from these companies.  You will be contacted with the lab results as soon as they are available. The fastest way to get your results is to activate your My Chart account. Instructions are located on the last page of this paperwork. If you have not heard from [place] regarding the results in 2 weeks, please contact this office.    We recommend that you schedule a  mammogram for breast cancer screening. Typically, you do not need a referral to do this. Please contact a local imaging center to schedule your mammogram.  [facility] - [phone]  *ask for the Radiology Department [facility]) - [phone] or [phone]  [facility] - [phone] [facility] - [phone] [facility] - [phone]  *ask for the Radiology Department [facility] - [phone]  *ask for the Radiology Department [facility] - [phone]  *ask for the Mammography Department [facility] - [phone] Rib Fracture  A rib fracture is a break or crack in one of the bones of the ribs. The ribs are like a cage that goes around your upper chest. A broken or cracked rib is often painful, but most do not cause other problems. Most rib fractures usually heal on their own in 1-3 months. Follow these instructions at home: Managing pain, stiffness, and swelling  If directed, apply ice to the injured area. ? Put ice in a plastic bag. ? Place a towel between your skin and the bag. ? Leave the ice on for 20 minutes, 2-3 times a day.  Take over-the-counter and prescription medicines only as told by your doctor. Activity  Avoid activities that cause pain to the injured area. Protect your injured area.  Slowly increase activity as told by your doctor. General instructions  Do deep breathing as told by your doctor. You may be told to: ? Take deep breaths many times a day. ? Cough many times a day while hugging a pillow. ? Use a device (incentive spirometer) to do deep breathing many times a day.  Drink enough fluid to keep your pee (urine) clear or pale yellow.  Do not wear a rib belt or binder. These do not allow you to breathe deeply.  Keep all follow-up visits as told by your doctor. This is important. Contact a doctor if:  You have a fever. Get help  right away if:  You  have trouble breathing.  You are short of breath.  You cannot stop coughing.  You cough up thick or bloody spit (sputum).  You feel sick to your stomach (nauseous), throw up (vomit), or have belly (abdominal) pain.  Your pain gets worse and medicine does not help. Summary  A rib fracture is a break or crack in one of the bones of the ribs.  Apply ice to the injured area and take medicines for pain as told by your doctor.  Take deep breaths and cough many times a day. Hug a pillow every time you cough. This information is not intended to replace advice given to you by your health care provider. Make sure you discuss any questions you have with your health care provider. Document Released: 09/27/2007 Document Revised: 11/30/2016 Document Reviewed: 03/20/2016 Elsevier Patient Education  2020 [employer]      Edwina BarthMiguel Lolitha Tortora, MD [facility]

## 2018-12-10 ENCOUNTER — Telehealth: Payer: Self-pay | Admitting: Emergency Medicine

## 2018-12-10 NOTE — Telephone Encounter (Signed)
HYDROcodone-acetaminophen (NORCO) 5-325 MG tablet     Patient requesting refill.    Pharmacy:  [facility] [address], [place] [phone] (Phone) [phone] (Fax)

## 2018-12-11 DIAGNOSIS — G35 Multiple sclerosis: Secondary | ICD-10-CM | POA: Diagnosis not present

## 2018-12-17 NOTE — Telephone Encounter (Signed)
Rx sent in by other means

## 2019-01-27 DIAGNOSIS — G35 Multiple sclerosis: Secondary | ICD-10-CM | POA: Diagnosis not present

## 2019-02-24 DIAGNOSIS — G35 Multiple sclerosis: Secondary | ICD-10-CM | POA: Diagnosis not present

## 2019-03-20 ENCOUNTER — Telehealth: Payer: Self-pay | Admitting: Neurology

## 2019-03-20 NOTE — Telephone Encounter (Signed)
I reviewed feedback of the examination performed by [facility] first the service of [place], disability determination service,  The claimant was noted to be only able to rise from a seated position without assistant, and was unable to walk around the exam room without use of the cane, in addition, the claimant was unable to perform a range of functional assessment task including scribe, writes, walking on heels, and toes, tandem walking, stand on 1 foot bilaterally due to bilateral leg weakness, causing instability with balance, consistent with functional limitation as listed below  The claimant can be expected to sleep normally in an 8-hour workday with normal breaks, however, is unable to stand and walk normally due to bilateral leg weakness, causing stability with balance, the claimant does need assistant device with regard to short and long distance, uneven terrain, she needs a cane, the claimant does have significant limitation with lifting, carrying weight due to bilateral leg weakness, causing instability with balance, there limitations on bending, stooping, crouching, squatting, and still on due to bilateral leg weakness, causing stability with balance, there is no manipulative limitation on reaching, handling, feeling, grasping, fingering, there is no relevant visual, communicative, or workplace environmental limitations,

## 2019-03-24 DIAGNOSIS — G35 Multiple sclerosis: Secondary | ICD-10-CM | POA: Diagnosis not present

## 2019-04-02 ENCOUNTER — Other Ambulatory Visit: Payer: Self-pay

## 2019-04-02 ENCOUNTER — Ambulatory Visit: Payer: BLUE CROSS/BLUE SHIELD | Admitting: Neurology

## 2019-04-02 ENCOUNTER — Encounter: Payer: Self-pay | Admitting: Neurology

## 2019-04-02 VITALS — BP 122/88 | HR 73 | Temp 97.2°F | Ht 62.0 in | Wt 106.0 lb

## 2019-04-02 DIAGNOSIS — R269 Unspecified abnormalities of gait and mobility: Secondary | ICD-10-CM | POA: Diagnosis not present

## 2019-04-02 DIAGNOSIS — R5383 Other fatigue: Secondary | ICD-10-CM | POA: Diagnosis not present

## 2019-04-02 DIAGNOSIS — G35 Multiple sclerosis: Secondary | ICD-10-CM

## 2019-04-02 NOTE — Patient Instructions (Signed)
Check blood work today  Continue current medications Ordered MRI of the brain See you in 6 months

## 2019-04-02 NOTE — Progress Notes (Signed)
PATIENT: [name] DOB: 1965-01-30  REASON FOR VISIT: follow up HISTORY FROM: patient  HISTORY OF PRESENT ILLNESS: Today 04/02/19  HISTORY EMELINA HINCH 54 year old female, seen in refer by her primary care doctor Georgina Quint, for evaluation of weakness, initial evaluation was on March 14, 2017.  I reviewed and summarized the referring note, she had a history of hysterectomy in 2003 cholecystectomy in 1999, long time smoker, still smoke half pack a day,  She was highly functional until June 2018, without clear triggers, she began to notice bilateral anterior thigh muscle achy pain, spasm, also noticed gradual onset gait abnormality, which has progressively worse over the past few months, recently she also noticed bilateral hands numbness tingling, clumsy of bilateral hand,  Initially she has mild low back pain, but no longer has significant low back pain, she denies neck pain, denies bowel bladder incontinence, she denies dysarthria, no diplopia no dysphagia.  I reviewed the laboratory evaluation in February 2019, CBC showed elevated WBC of 15.3, hemoglobin of 16.6, normal BMP, negative troponin, CPK was 25, CMP showed elevated alkaline phosphate 156, UA showed evidence of bacteria, normal TSH, negative HIV,  UPDATE March 28 2017: Patient is accompanied by her friend at today's clinical visit, she continue complains of slow decline gait abnormality, paresthesia achy pain of bilateral lower extremity.  MRI of cervical spine with without contrast at [facility] in March 2019 showed evidence of signal abnormality behind C3 and 4, with mild enhancement,  MRI of brain showed evidence of multiple area of abnormal T2/FLAIR hyperintensity signal in the white matter at both hemisphere predominantly periventricular, there was also T1 black holes, involving the lower pons, medulla, deep left cerebellar white matter, faint enhancement of at least one lesion in the  left frontal lobe, unusual bulbous appearing of the anterior, inferior third ventricle in the region of the infundibular recess, with partial ringlike enhancement, hyper intense signal in the hypothalamus, right greater than left, possible he also optic asthma, right greater than left,  MRI of thoracic spine showed abnormal signal behind C7 T1-T2, also behind T6, T7, T8, some enhancement of the upper cord abnormality.  Imaging findings along with her history most consistent with relapsing remitting multiple sclerosis,   Laboratory evaluations in March 2019, vitamin D level was decreased 24, normal or negative NMO antibody were negative,Lyme titer, ANA, copper, ferritin level was mildly elevated 183, CPK, TSH, C-reactive protein, folic acid, A1c was 5.5, HIV, B12, RPR,  We had discussed extensive treatment options, with her aggressive clinical course, gait abnormality, significant abnormality on MRI of brain and spinal cord, will proceed with IV infusion, offered her the option of Tysarbri versus ocrelizumab  She also complains of significant fatigue,  UPDATE August 6th 2019: She is used to work as Building surveyor for hotel, 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, now she has significant gait abnormality, rely on her cane, also complains of low back pain, over the past few months we have tried Cymbalta, gabapentin, Lyrica, nortriptyline without helping her symptoms, she cannot tolerate NSAIDs due to GI symptoms, and there was also documented anaphylactic reaction,  [place] since April 2019, last infusion was on July 28, 2017, she tolerated very well, there was no significant side effect noticed, she continue have fatigue, taking Provigil 100 mg daily for her fatigue  Laboratory evaluation seen April 2019, showed normal or negative varicella-zoster IgG, hepatitis C, B antibody, quantiferonTB Gold antibody, vitamin D  level is 24, JC virus antibody was decreased 0.29, Lyme  titer was negative, NMO antibody was less than 1.5, ANA, copper, Ferritin 183, A1C 5.5, cpk, tsh, crp, folic acid, HIV, RPR, B12.  CSF showed more than 5 oligoclonal banding, with normal total protein 31  UPDATE Feb 18 2018: She is tolerating Tysabri infusion well, continue complains of gait abnormality, bilateral lower extremity deep achy pain, especially at nighttime, ambulate with a walker, fatigue has been helped by Nuvigil Update 03/24/2018 Dr. Terrace Arabia: Last Tysabri IV infusion was on February 14, 2018, laboratory evaluation on February 18, 2018 showed significant abnormal liver functional test, alkaline phosphate 523, AST 387, ALT 297,  Repeat laboratory evaluations on March 06, 2018 showed significant improvement, alkaline phosphate 275, normal AST 15, ALT 21, patient reported that she has been taking frequent Tylenol 500 mg up to 8 tablets on a daily basis because of bilateral lower extremity deep achy pain,  For similar complaints, we have tried Cymbalta, gabapentin, nortriptyline, NSAIDs Lyrica without helping her symptoms  She complains of excessive drowsiness with baclofen, is no longer taking it,  I gave her a trial of Trileptal 150 mg twice a day March 2020, she denies significant improvement.  She has missed her March 2020 Tysabri infusion  I personally reviewed MRI of the brain with and without contrast on March 15, 2018: Bilateral scattered supratentorium, periventricular and subcortical white matter hyperintensity, solitary subtle linear enhancing lesion in the right frontal just cortical region, presence of multiple T1 black holes, mild supratentorium cortical atrophy  Update April 02, 2019 SS: She is on Tysabri, doing well with that. She has pain to tops of her legs, days where her legs don't operate right, like she may be clumsy, wobbley and wobbly. Fell in November, cracked 3 ribs, has healed, was sore. Tried tizanidine, it must not have helped, because she doesn't remember  it. She doesn't work, is try to get disability, using walker. She prefers not to drive, because has to closely concentrate. Lives alone, her husband passed away 2 months ago, unexpectedly, married 20 years. Overall, doing well, condition is stable, no new symptoms.  REVIEW OF SYSTEMS: Out of a complete 14 system review of symptoms, the patient complains only of the following symptoms, and all other reviewed systems are negative.  Paresthesia, gait abnormality   ALLERGIES: Allergies  Allergen Reactions  . Dilaudid [Hydromorphone Hcl] Anaphylaxis  . Ibuprofen Anaphylaxis  . Iodine Anaphylaxis  . Penicillins Anaphylaxis    Has patient had a PCN reaction causing immediate rash, facial/tongue/throat swelling, SOB or lightheadedness with hypotension:yes Has patient had a PCN reaction causing severe rash involving mucus membranes or skin necrosis: no Has patient had a PCN reaction that required hospitalization no Has patient had a PCN reaction occurring within the last 10 years: no If all of the above answers are "NO", then may proceed with Cephalosporin use.   . Gabapentin Other (See Comments)    GI upset  . Codeine Nausea Only  . Tramadol Nausea And Vomiting    HOME MEDICATIONS: Outpatient Medications Prior to Visit  Medication Sig Dispense Refill  . Armodafinil 150 MG tablet TAKE 1 TABLET(150 MG) BY MOUTH DAILY 30 tablet 5  . Cholecalciferol (VITAMIN D3) 25 MCG (1000 UT) CAPS Take 2 capsules by mouth daily.    . DULoxetine (CYMBALTA) 30 MG capsule Take 1 capsule (30 mg total) by mouth daily. (Patient not taking: Reported on 12/04/2018) 30 capsule 1  . HYDROcodone-acetaminophen (NORCO) 5-325 MG tablet Take  1 tablet by mouth every 6 (six) hours as needed for severe pain. 15 tablet 0  . methocarbamol (ROBAXIN) 500 MG tablet Take 1 tablet (500 mg total) by mouth 2 (two) times daily as needed for muscle spasms. (Patient not taking: Reported on 12/04/2018) 60 tablet 1   No facility-administered  medications prior to visit.    PAST MEDICAL HISTORY: Past Medical History:  Diagnosis Date  . Multiple sclerosis ([place])   . Shingles   . Weakness     PAST SURGICAL HISTORY: Past Surgical History:  Procedure Laterality Date  . ABDOMINAL HYSTERECTOMY    . CHOLECYSTECTOMY      FAMILY HISTORY: No family history on file.  SOCIAL HISTORY: Social History   Socioeconomic History  . Marital status: Married    Spouse name: Not on file  . Number of children: 0  . Years of education: college  . Highest education level: Not on file  Occupational History  . Occupation: Unemployed  Tobacco Use  . Smoking status: Current Every Day Smoker    Packs/day: 0.50    Types: Cigarettes  . Smokeless tobacco: Never Used  Substance and Sexual Activity  . Alcohol use: No  . Drug use: No  . Sexual activity: Not on file  Other Topics Concern  . Not on file  Social History Narrative   Lives at home with husband.   Caffeine use:  1-2 cups daily.   Right-handed.   Social Determinants of Health   Financial Resource Strain:   . Difficulty of Paying Living Expenses:   Food Insecurity:   . Worried About Charity fundraiser in the Last Year:   . Arboriculturist in the Last Year:   Transportation Needs:   . Film/video editor (Medical):   [name] Lack of Transportation (Non-Medical):   Physical Activity:   . Days of Exercise per Week:   . Minutes of Exercise per Session:   Stress:   . Feeling of Stress :   Social Connections:   . Frequency of Communication with Friends and Family:   . Frequency of Social Gatherings with Friends and Family:   . Attends Religious Services:   . Active Member of Clubs or Organizations:   . Attends Archivist Meetings:   [name] Marital Status:   Intimate Partner Violence:   . Fear of Current or Ex-Partner:   . Emotionally Abused:   [name] Physically Abused:   . Sexually Abused:       PHYSICAL EXAM  Vitals:   04/02/19 1420  BP: 122/88  Pulse: 73    Temp: (!) 97.2 F (36.2 C)  Weight: 106 lb (48.1 kg)  Height: 5\' 2"  (1.575 m)   Body mass index is 19.39 kg/m.  Generalized: Well developed, in no acute distress   Neurological examination  Mentation: Alert oriented to time, place, history taking. Follows all commands speech and language fluent Cranial nerve II-XII: Pupils were equal round reactive to light. Extraocular movements were full, visual field were full on confrontational test. Facial sensation and strength were normal. Head turning and shoulder shrug were normal and symmetric. Motor: Good strength of all extremities, mild spasticity of bilateral lower extremities, mild hip flexion weakness bilaterally Sensory: Sensory testing is intact to soft touch on all 4 extremities. No evidence of extinction is noted.  Coordination: Cerebellar testing reveals good finger-nose-finger and heel-to-shin bilaterally.  Gait and station: Has to push off from seated position to stand, gait is unsteady, wide-based, dragging right  leg, has walker Reflexes: Deep tendon reflexes are symmetric but brisk throughout  DIAGNOSTIC DATA (LABS, IMAGING, TESTING) - I reviewed patient records, labs, notes, testing and imaging myself where available.  Lab Results  Component Value Date   WBC 13.8 (H) 10/16/2018   HGB 15.3 10/16/2018   HCT 44.0 10/16/2018   MCV 92 10/16/2018   PLT 192 10/16/2018      Component Value Date/Time   NA 146 (H) 04/21/2018 1328   K 3.8 04/21/2018 1328   CL 101 04/21/2018 1328   CO2 29 04/21/2018 1328   GLUCOSE 89 04/21/2018 1328   GLUCOSE 98 02/18/2017 2053   BUN 15 04/21/2018 1328   CREATININE 0.85 04/21/2018 1328   CALCIUM 9.0 04/21/2018 1328   PROT 6.1 04/21/2018 1328   ALBUMIN 4.1 04/21/2018 1328   ALBUMIN 4.2 04/16/2017 1143   AST 12 04/21/2018 1328   ALT 6 04/21/2018 1328   ALKPHOS 173 (H) 04/21/2018 1328   BILITOT <0.2 04/21/2018 1328   GFRNONAA 78 04/21/2018 1328   GFRAA 90 04/21/2018 1328   No results  found for: CHOL, HDL, LDLCALC, LDLDIRECT, TRIG, CHOLHDL Lab Results  Component Value Date   HGBA1C 5.5 03/27/2017   Lab Results  Component Value Date   VITAMINB12 633 03/27/2017   Lab Results  Component Value Date   TSH 0.827 03/27/2017   ASSESSMENT AND PLAN 54 y.o. year old female  has a past medical history of Multiple sclerosis (HCC), Shingles, and Weakness. here with:  1.  Relapsing remitting multiple sclerosis -Overall, stable, no new symptoms -Evidence of cervical, and thoracic involvement -She has been on Tysabri since April 2019, had to stop early in April 2020, due to elevated liver enzymes, likely due to long-term large Tylenol dose usage, this cleared  -JCV in October 2020 0.21, indeterminate, inhibition assay negative -Check labs, and JCV today -Order MRI of the brain with and without contrast (will need sedation, will send Xanax once MRI scheduled) -Continue Tysabri   2.  Gait abnormality, bilateral lower extremity deep achy pain -Tried and failed gabapentin, Lyrica, Cymbalta, NSAIDs, baclofen, Trileptal, most recently tried on tizanidine  3.  Fatigue -Continue Nuvigil -Return in 6 months or sooner if needed  I spent 30 minutes of face-to-face and non-face-to-face time with patient.  This included previsit chart review, lab review, study review, order entry, electronic health record documentation, patient education.  Margie Ege, AGNP-C, DNP 04/02/2019, 2:41 PM [facility] [address], [address] [place], [place] [address] [phone]

## 2019-04-02 NOTE — Progress Notes (Signed)
JCV Lab have been sent to [facility] on 04/02/19

## 2019-04-03 LAB — CBC WITH DIFFERENTIAL/PLATELET
Basophils Absolute: 0.1 10*3/uL (ref 0.0–0.2)
Basos: 1 %
EOS (ABSOLUTE): 0.2 10*3/uL (ref 0.0–0.4)
Eos: 1 %
Hematocrit: 40.5 % (ref 34.0–46.6)
Hemoglobin: 14.2 g/dL (ref 11.1–15.9)
Immature Grans (Abs): 0 10*3/uL (ref 0.0–0.1)
Immature Granulocytes: 0 %
Lymphocytes Absolute: 3.3 10*3/uL — ABNORMAL HIGH (ref 0.7–3.1)
Lymphs: 28 %
MCH: 32.6 pg (ref 26.6–33.0)
MCHC: 35.1 g/dL (ref 31.5–35.7)
MCV: 93 fL (ref 79–97)
Monocytes Absolute: 1 10*3/uL — ABNORMAL HIGH (ref 0.1–0.9)
Monocytes: 8 %
Neutrophils Absolute: 7.3 10*3/uL — ABNORMAL HIGH (ref 1.4–7.0)
Neutrophils: 62 %
Platelets: 168 10*3/uL (ref 150–450)
RBC: 4.35 x10E6/uL (ref 3.77–5.28)
RDW: 12.7 % (ref 11.7–15.4)
WBC: 11.9 10*3/uL — ABNORMAL HIGH (ref 3.4–10.8)

## 2019-04-03 LAB — COMPREHENSIVE METABOLIC PANEL
ALT: 183 IU/L — ABNORMAL HIGH (ref 0–32)
AST: 207 IU/L — ABNORMAL HIGH (ref 0–40)
Albumin/Globulin Ratio: 2 (ref 1.2–2.2)
Albumin: 4.3 g/dL (ref 3.8–4.9)
Alkaline Phosphatase: 490 IU/L — ABNORMAL HIGH (ref 39–117)
BUN/Creatinine Ratio: 18 (ref 9–23)
BUN: 17 mg/dL (ref 6–24)
Bilirubin Total: 0.4 mg/dL (ref 0.0–1.2)
CO2: 29 mmol/L (ref 20–29)
Calcium: 9.4 mg/dL (ref 8.7–10.2)
Chloride: 99 mmol/L (ref 96–106)
Creatinine, Ser: 0.95 mg/dL (ref 0.57–1.00)
GFR calc Af Amer: 78 mL/min/{1.73_m2} (ref >59)
GFR calc non Af Amer: 68 mL/min/{1.73_m2} (ref >59)
Globulin, Total: 2.1 g/dL (ref 1.5–4.5)
Glucose: 102 mg/dL — ABNORMAL HIGH (ref 65–99)
Potassium: 4.2 mmol/L (ref 3.5–5.2)
Sodium: 140 mmol/L (ref 134–144)
Total Protein: 6.4 g/dL (ref 6.0–8.5)

## 2019-04-06 ENCOUNTER — Telehealth: Payer: Self-pay | Admitting: Neurology

## 2019-04-06 NOTE — Telephone Encounter (Signed)
Left patient a detailed message, with results and instructions to schedule follow up w/ PCP, on voicemail (ok per DPR).  Provided our number to call back with any questions.

## 2019-04-06 NOTE — Telephone Encounter (Signed)
Please call patient, laboratory evaluation showed significant abnormal liver functional test, with elevated alkaline phosphate 490, AST 207, ALT183, which is significant change compared to previous laboratory evaluation 11 months ago,  There is also evidence of evaded WBC of 11.9 with elevated absolute neutrophil,  She has been treated with Tysabri for her relapsing remitting multiple sclerosis since April 2019, less likely above abnormality is due to Tysabri treatment,  I have faxed the lab result to her PCP Dr. Alvy Bimler, [facility], relayed above information, she should contact her office as soon as possible for further evaluation.

## 2019-04-07 ENCOUNTER — Encounter: Payer: Self-pay | Admitting: Neurology

## 2019-04-07 ENCOUNTER — Other Ambulatory Visit: Payer: Self-pay | Admitting: Neurology

## 2019-04-07 ENCOUNTER — Telehealth: Payer: Self-pay | Admitting: Neurology

## 2019-04-07 MED ORDER — ALPRAZOLAM 1 MG PO TABS: ORAL_TABLET | ORAL | 0 refills | Status: DC

## 2019-04-07 NOTE — Progress Notes (Signed)
I have sent in Xanax before MRI for patient, 1 mg tablets, as Dr. Terrace Arabia has done before.

## 2019-04-07 NOTE — Telephone Encounter (Signed)
[employer] Auth: [id number] (exp. 04/07/19 to 10/03/19) order faxed to [facility]. They will reach out to the patient to schedule.

## 2019-04-08 NOTE — Telephone Encounter (Signed)
Schedule at [facility] for 04/17/19.

## 2019-04-14 ENCOUNTER — Telehealth: Payer: Self-pay | Admitting: Neurology

## 2019-04-14 NOTE — Telephone Encounter (Signed)
JCV on 04/02/2019 was 0.23 indeterminate, inhibition assay was negative.   In October 2020, 0.21 indeterminate, inhibition assay was negative.

## 2019-04-16 ENCOUNTER — Telehealth: Payer: Self-pay | Admitting: Neurology

## 2019-04-16 NOTE — Telephone Encounter (Signed)
I personally do not know this. Maybe Gabriela Campbell could help with this?  We could provide her with a letter documenting her MS and associated symptoms if that may be helpful.

## 2019-04-16 NOTE — Telephone Encounter (Signed)
Pt called requesting a CB from RN to discuss food stamp assistance

## 2019-04-16 NOTE — Telephone Encounter (Signed)
I called pt and she states that she has attempted to get food stamps and they are needing some type of documentation that she cannot work.  She has applied for SSD 29yrs ago and is waiting.  We have not filled out any type of documentation for her.

## 2019-04-16 NOTE — Telephone Encounter (Signed)
I called pt and relayed thry VM that we can provide letter that you have MS and associated sx with that.  Meals on wheels, food banks are other options.  She is to let us know.

## 2019-04-17 DIAGNOSIS — G35 Multiple sclerosis: Secondary | ICD-10-CM | POA: Diagnosis not present

## 2019-04-17 DIAGNOSIS — R200 Anesthesia of skin: Secondary | ICD-10-CM | POA: Diagnosis not present

## 2019-04-21 ENCOUNTER — Telehealth: Payer: Self-pay | Admitting: Neurology

## 2019-04-21 DIAGNOSIS — G35 Multiple sclerosis: Secondary | ICD-10-CM | POA: Diagnosis not present

## 2019-04-21 NOTE — Telephone Encounter (Signed)
Sandy, I tried to call the patient x 2, left a message asking for a call back to discuss MRI, and review findings (from prior telephone note). Make sure she is seeing PCP for evaluation of liver enzymes. Looks like Dr. Terrace Arabia faxed results over to PCP on 4/5. I will see her in October. Please check with her, important she has follow-up for this issue.

## 2019-04-21 NOTE — Telephone Encounter (Signed)
Reviewed MRI of the brain with and without contrast from [facility], completed on April 17, 2019. CD attached for review.  Impression 1.  Multiple demyelinating plaques seen in both cerebral hemispheres and brainstem.  Consistent with patient's known demyelinating process such as multiple sclerosis.  No new lesions appreciated.  There is nonspecific enhancement in the right frontal lobe that is not associated with a demyelinating plaque.  This is uncertain etiology.  2.  Persistent unusual bulbous appearance of the anterior, inferior third ventricle, in the region of the inferior recess/optic chasm with partial ringlike enhancement that is indeterminate but could represent a demyelinating lesion as well.  Recommend continued close attention to this region on the follow-up examinations.  Overall, seems to be overall stable, no new lesions were seen.  Will need to be closely monitor.  She remains on Tysabri.  I saw her for follow-up 04/02/2019, was overall stable at that time.  Routine lab work showed significant abnormal liver function test, elevated alkaline phosphatase 190, AST 207, ALT 183, felt to be less likely due to Tysabri. Will route this to Dr. Terrace Arabia as well, she needs to make sure follow-up with PCP for evaluation of liver enzymes.

## 2019-04-21 NOTE — Telephone Encounter (Signed)
Please follow up on her abnormal LFTs workup by her pcp

## 2019-04-22 NOTE — Telephone Encounter (Signed)
I called pt and relayed the MRI/lab results. Per SS/NP  seems to be overall stable, no new lesions were seen.  Will need to be closely monitor.  Remains on Tysabri.  SS/NP saw her for follow-up 04/02/2019, was overall stable at that time.  Routine lab work showed significant abnormal liver function test, elevated alkaline phosphatase 190, AST 207, ALT 183, felt to be less likely due to Tysabri. This was routed to Dr. Terrace Arabia as well, she needs to make sure follow-up with PCP for evaluation of liver enzymes.  Pt verbalized understanding.

## 2019-04-22 NOTE — Telephone Encounter (Signed)
Pt has called back, please call pt back [place], [place]

## 2019-05-12 NOTE — Progress Notes (Signed)
I have reviewed and agreed above plan. 

## 2019-05-16 ENCOUNTER — Other Ambulatory Visit: Payer: Self-pay | Admitting: Neurology

## 2019-05-18 NOTE — Telephone Encounter (Signed)
Pt is due for a refill on nuvigil. Pt is up to date on her appts. [place] Controlled Substance Registry reviewed and is appropriate.

## 2019-05-19 ENCOUNTER — Telehealth: Payer: Self-pay | Admitting: Neurology

## 2019-05-19 MED ORDER — ARMODAFINIL 150 MG PO TABS: ORAL_TABLET | ORAL | 5 refills | Status: DC

## 2019-05-19 NOTE — Addendum Note (Signed)
Addended by: Glean Salvo on: 05/19/2019 05:06 PM   Modules accepted: Orders

## 2019-05-19 NOTE — Addendum Note (Signed)
Addended by: Guy Begin on: 05/19/2019 05:04 PM   Modules accepted: Orders

## 2019-05-19 NOTE — Telephone Encounter (Signed)
Pt is no longer insured, she is asking that her  Armodafinil 150 MG tablet be called into [facility] [phone]

## 2019-07-29 IMAGING — US US ABDOMEN COMPLETE
1 series · 14 of 25 positions shown · non-contrast
Comparison: CT abdomen pelvis [DATE]

CLINICAL DATA: Abnormal liver function tests.

EXAM:
ABDOMEN ULTRASOUND COMPLETE

[Series 1: us abdomen complete · 0.22mm/px · 14 of 86 slices shown]
[im 1/86]
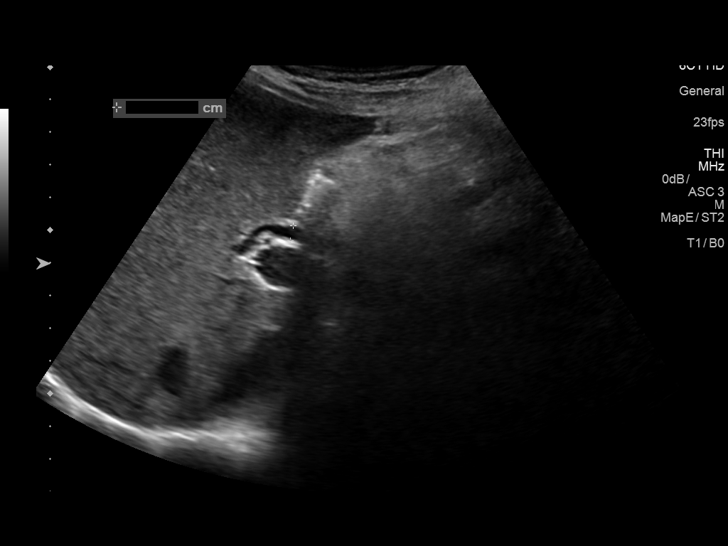
[im 8/86]
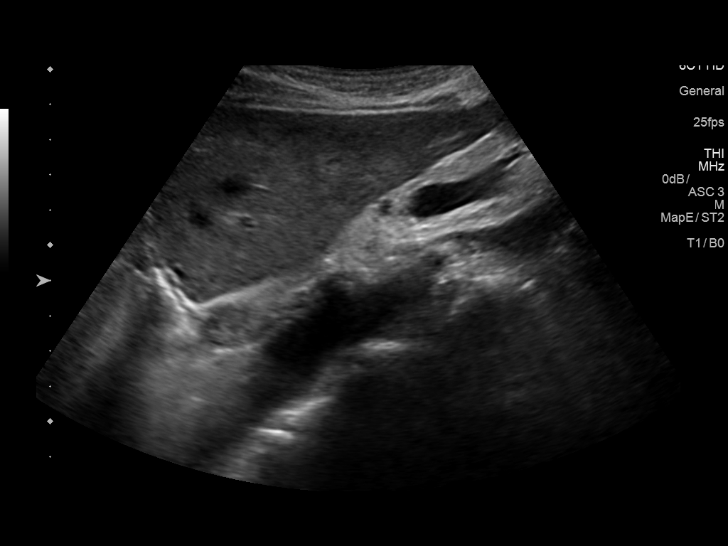
[im 15/86]
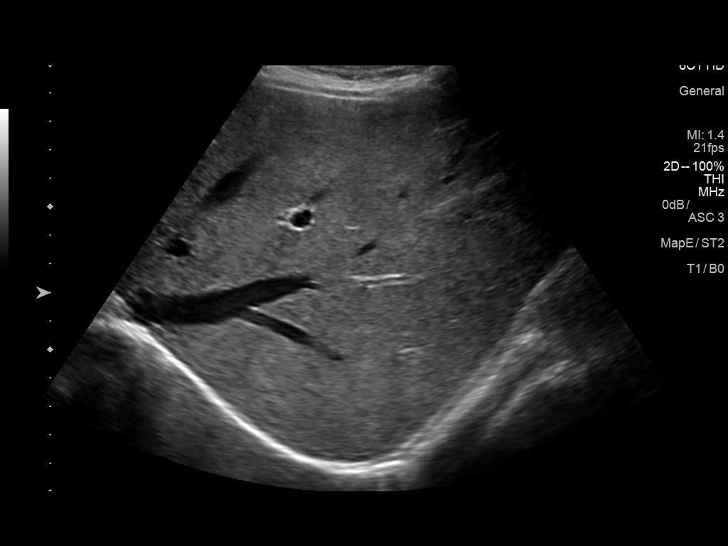
[im 22/86]
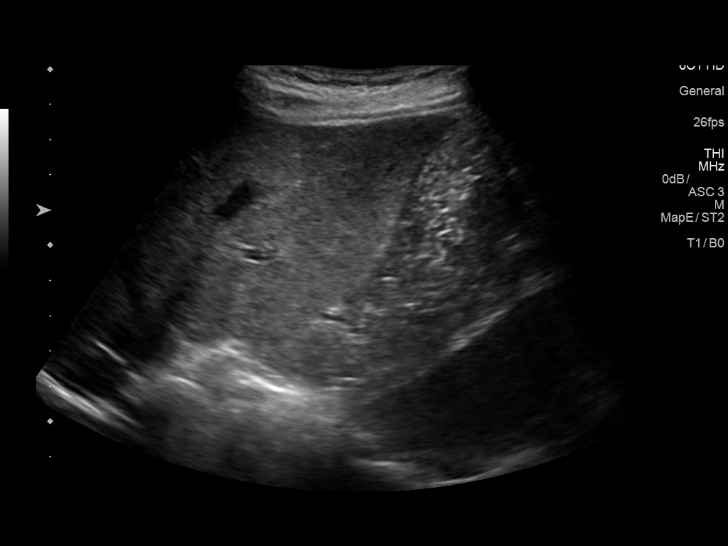
[im 29/86]
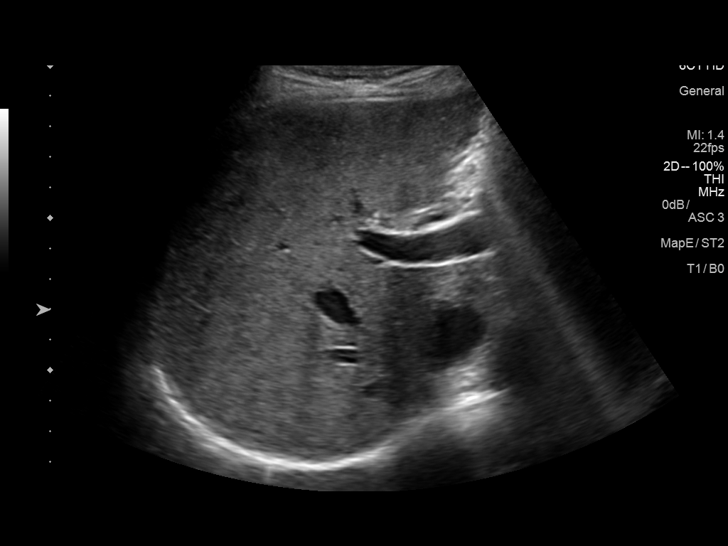
[im 32/86]
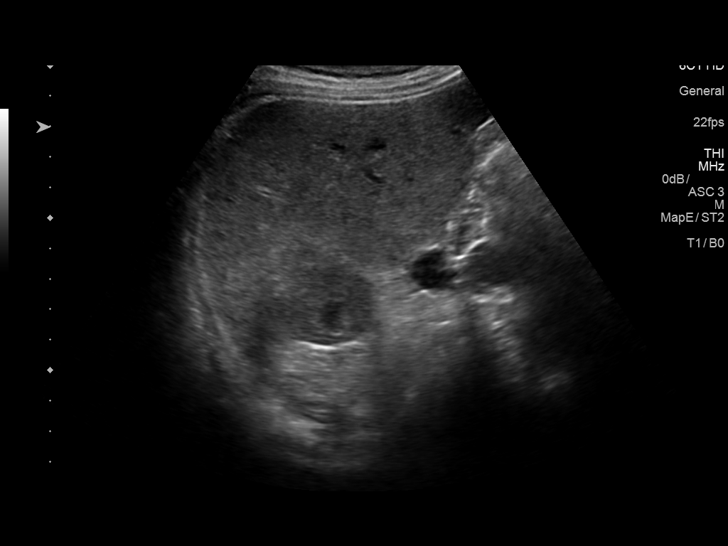
[im 39/86]
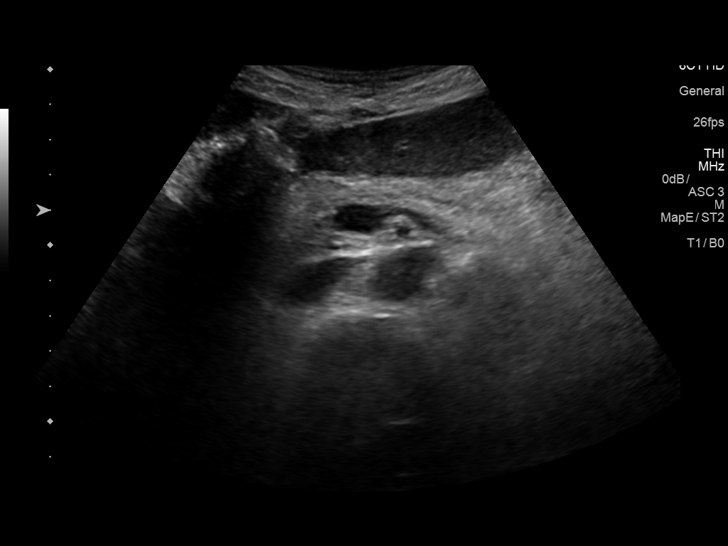
[im 47/86]
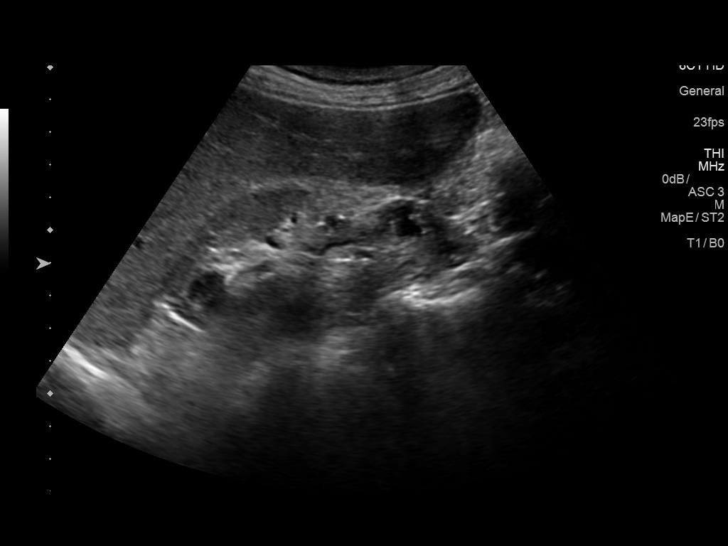
[im 54/86]
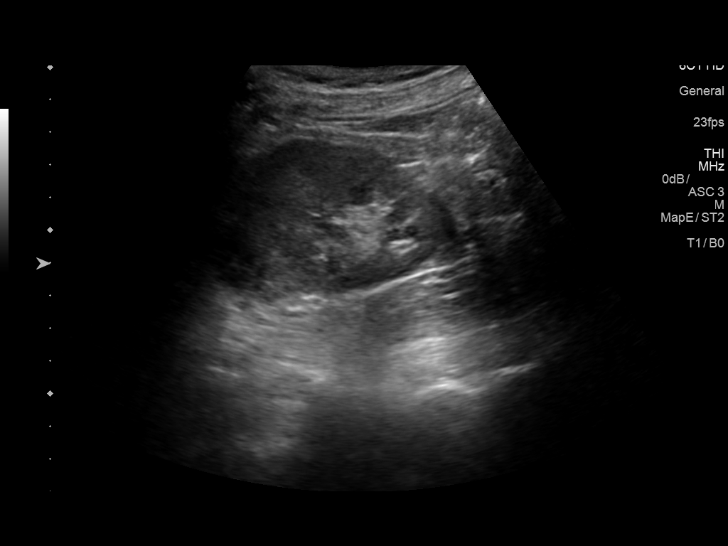
[im 57/86]
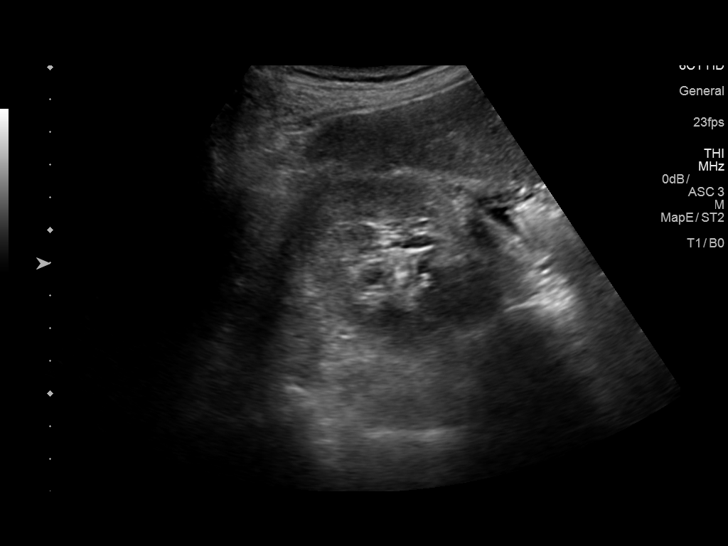
[im 64/86]
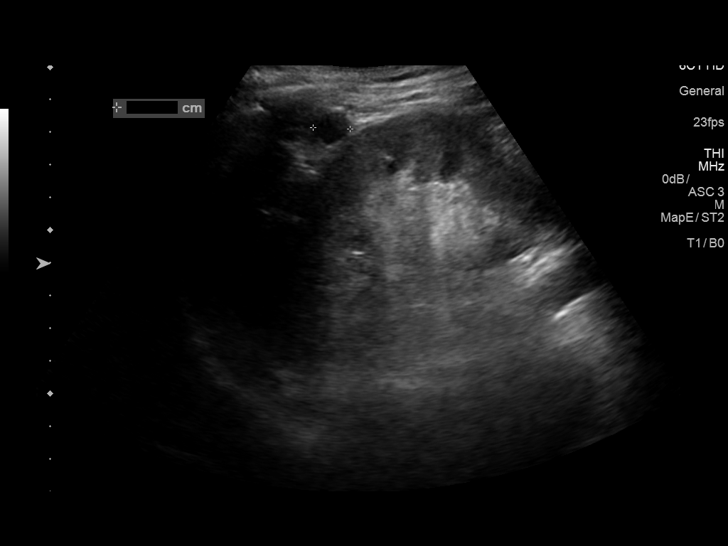
[im 71/86]
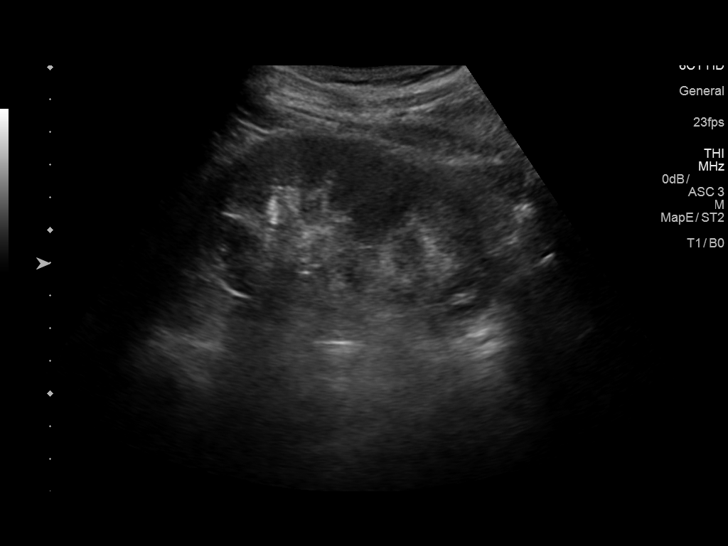
[im 78/86]
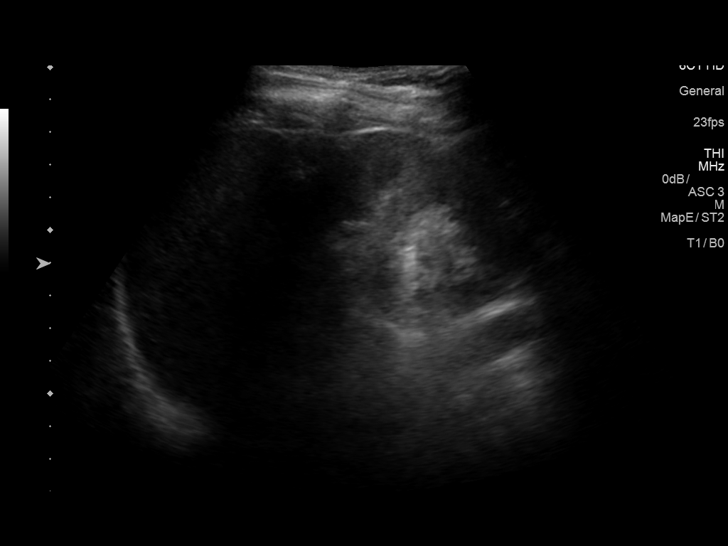
[im 86/86]
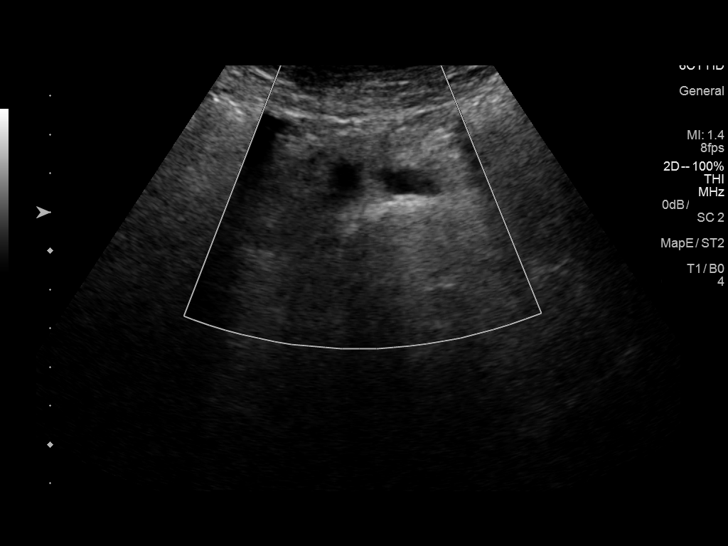

[14 of 25 positions shown; findings below may reference images not displayed]

FINDINGS: Gallbladder: Status post prior cholecystectomy.

Common bile duct: Diameter: 4.6 mm

Liver: No focal lesion identified. Within normal limits in
parenchymal echogenicity. Portal vein is patent on color Doppler
imaging with normal direction of blood flow towards the liver.

IVC: No abnormality visualized.

Pancreas: Visualized portion unremarkable.

Spleen: Size and appearance within normal limits.

Right Kidney: Length: 9.8 cm. Echogenicity within normal limits. No
hydronephrosis visualized. There is a 1 cm cyst in the upper pole
right kidney.

Left Kidney: Length: 9.6 cm. Echogenicity within normal limits. No
hydronephrosis visualized. There is a 1.1 cm simple cyst in the left
kidney.

Abdominal aorta: No aneurysm visualized.

Other findings: None.
IMPRESSION: No acute abnormality identified.

The liver is normal.

Prior cholecystectomy.

Kidney cysts.

## 2019-08-20 ENCOUNTER — Encounter: Payer: Self-pay | Admitting: *Deleted

## 2019-08-24 ENCOUNTER — Telehealth: Payer: Self-pay | Admitting: *Deleted

## 2019-08-24 NOTE — Telephone Encounter (Addendum)
The patient currently has a pending appt w/ Sarah on 10/07/19. While she was here last week getting her Tysabri infusion, she requesting to be seen sooner.  Maralyn Sago had a cancellation this week on 08/26/19 at 2:15pm (check-in 1:45pm). I left the patient a message offering this time. I requested she call me back prior to 1pm today to confirm this follow up. It has been placed on hold.

## 2019-08-24 NOTE — Telephone Encounter (Signed)
I spoke to the patient. She declined this appt. States she is getting some things worked out on her insurance. She would like to keep her current pending appt for now.

## 2019-10-07 ENCOUNTER — Ambulatory Visit: Payer: Self-pay | Admitting: Neurology

## 2019-10-07 ENCOUNTER — Encounter: Payer: Self-pay | Admitting: Neurology

## 2019-10-07 ENCOUNTER — Telehealth: Payer: Self-pay | Admitting: Neurology

## 2019-10-07 ENCOUNTER — Other Ambulatory Visit: Payer: Self-pay

## 2019-10-07 VITALS — BP 138/79 | HR 62 | Ht 62.0 in | Wt 106.4 lb

## 2019-10-07 DIAGNOSIS — R269 Unspecified abnormalities of gait and mobility: Secondary | ICD-10-CM

## 2019-10-07 DIAGNOSIS — G35 Multiple sclerosis: Secondary | ICD-10-CM

## 2019-10-07 NOTE — Progress Notes (Signed)
[name] DOB: 1965/07/05  REASON FOR VISIT: follow up HISTORY FROM: patient  HISTORY OF PRESENT ILLNESS: Today 10/07/19  HISTORY Gabriela Campbell 54 year old female, seen in refer by her primary care doctor Gabriela Campbell, for evaluation of weakness, initial evaluation was on March 14, 2017.  I reviewed and summarized the referring note, she had a history of hysterectomy in 2003 cholecystectomy in 1999, long time smoker, still smoke half pack a day,  She was highly functional until June 2018, without clear triggers, she began to notice bilateral anterior thigh muscle achy pain, spasm, also noticed gradual onset gait abnormality, which has progressively worse over the past few months, recently she also noticed bilateral hands numbness tingling, clumsy of bilateral hand,  Initially she has mild low back pain, but no longer has significant low back pain, she denies neck pain, denies bowel bladder incontinence, she denies dysarthria, no diplopia no dysphagia.  I reviewed the laboratory evaluation in February 2019, CBC showed elevated WBC of 15.3, hemoglobin of 16.6, normal BMP, negative troponin, CPK was 25, CMP showed elevated alkaline phosphate 156, UA showed evidence of bacteria, normal TSH, negative HIV,  UPDATE March 28 2017: Patient is accompanied by her friend at today's clinical visit, she continue complains of slow decline gait abnormality, paresthesia achy pain of bilateral lower extremity.  MRI of cervical spine with without contrast at [facility] in March 2019 showed evidence of signal abnormality behind C3 and 4, with mild enhancement,  MRI of brain showed evidence of multiple area of abnormal T2/FLAIR hyperintensity signal in the white matter at both hemisphere predominantly periventricular, there was also T1 black holes, involving the lower pons, medulla, deep left cerebellar white matter, faint enhancement of at least one lesion in the  left frontal lobe, unusual bulbous appearing of the anterior, inferior third ventricle in the region of the infundibular recess, with partial ringlike enhancement, hyper intense signal in the hypothalamus, right greater than left, possible he also optic asthma, right greater than left,  MRI of thoracic spine showed abnormal signal behind C7 T1-T2, also behind T6, T7, T8, some enhancement of the upper cord abnormality.  Imaging findings along with her history most consistent with relapsing remitting multiple sclerosis,   Laboratory evaluations in March 2019, vitamin D level was decreased 24, normal or negative NMO antibody were negative,Lyme titer, ANA, copper, ferritin level was mildly elevated 183, CPK, TSH, C-reactive protein, folic acid, A1c was 5.5, HIV, B12, RPR,  We had discussed extensive treatment options, with her aggressive clinical course, gait abnormality, significant abnormality on MRI of brain and spinal cord, will proceed with IV infusion, offered her the option of Tysarbri versus ocrelizumab  She also complains of significant fatigue,  UPDATE August 6th 2019: She is used to work as Building surveyor for hotel, 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, now she has significant gait abnormality, rely on her cane, also complains of low back pain, over the past few months we have tried Cymbalta, gabapentin, Lyrica, nortriptyline without helping her symptoms, she cannot tolerate NSAIDs due to GI symptoms, and there was also documented anaphylactic reaction,  She [place] since April 2019,last infusion was on July 28, 2017, she tolerated very well, there was no significant side effect noticed, she continue have fatigue, taking Provigil 100 mg daily for her fatigue  Laboratory evaluation seen April 2019, showed normal or negative varicella-zoster IgG, hepatitis C, B antibody, quantiferonTB Gold antibody, vitamin D level  is 24, JC virus antibody was decreased 0.29, Lyme  titer was negative, NMO antibody was less than 1.5, ANA, copper, Ferritin 183, A1C 5.5, cpk, tsh, crp, folic acid, HIV, RPR, B12.  CSF showed more than 5 oligoclonal banding, with normal total protein 31  UPDATE Feb 18 2018: She is tolerating Tysabri infusion well, continue complains of gait abnormality, bilateral lower extremity deep achy pain, especially at nighttime, ambulate with a walker, fatigue has been helped by Nuvigil Update 03/24/2018 Dr. Terrace Arabia: Last Tysabri IV infusion was on February 14, 2018, laboratory evaluation on February 18, 2018 showed significant abnormal liver functional test, alkaline phosphate 523, AST 387, ALT 297,  Repeat laboratory evaluations on March 06, 2018 showed significant improvement, alkaline phosphate 275, normal AST 15, ALT 21, patient reported that she has been taking frequent Tylenol 500 mg up to 8 tablets on a daily basis because of bilateral lower extremity deep achy pain,  For similar complaints, we havetried Cymbalta, gabapentin, nortriptyline, NSAIDsLyrica without helping her symptoms  She complains of excessive drowsiness with baclofen, is no longer taking it,  I gave her a trial of Trileptal 150 mg twice a day March 2020, she denies significant improvement.  She has missed her March 2020 Tysabri infusion  I personally reviewed MRI of the brain with and without contrast on March 15, 2018: Bilateral scattered supratentorium, periventricular and subcortical white matter hyperintensity, solitary subtle linear enhancing lesion in the right frontal just cortical region, presence of multiple T1 black holes, mild supratentorium cortical atrophy  Update April 02, 2019 SS: She is on Tysabri, doing well with that. She has pain to tops of her legs, days where her legs don't operate right, like she may be clumsy, wobbley and wobbly. Fell in November, cracked 3 ribs, has healed, was sore. Tried tizanidine, it must not have helped, because she doesn't  remember it. She doesn't work, is try to get disability, using walker. She prefers not to drive, because has to closely concentrate. Lives alone, her husband passed away 2 months ago, unexpectedly, married 20 years. Overall, doing well, condition is stable, no new symptoms.  Update October 07, 2019 SS: Laboratory evaluation in April showed elevated alkaline phosphatase 490, AST 207, ALT 183, WBC 11.9, elevated absolute neutrophil, felt less likely due to Tysabri.  JCV in April was 0.23 - inhibition assay.  MRI of the brain in April 2021 that was overall stable, but reported nonspecific enhancement in the right frontal lobe that is not associated with demyelinating plaque-uncertain etiology, unusual bulbous appearance in the anterior, inferior third ventricle in the region of the inferior recess/optic chasm that is indeterminate, could represent demyelinating lesions well.  Never followed up with PCP on elevated liver enzymes, she has no health insurance, just granted disability, couldn't afford to go. MS overall stable, continues with pain to the tops of her legs, has learned to live with it. She has most trouble with the left leg, drags the left leg. B & B doing well, using walker, lives alone, drove today. 1 fall last week, was not injured, lost balance. Continues to smoke a pack a day. Tysabri infusion coming up soon.  REVIEW OF SYSTEMS: Out of a complete 14 system review of symptoms, the patient complains only of the following symptoms, and all other reviewed systems are negative.  Walking difficulty  ALLERGIES: Allergies  Allergen Reactions  . Dilaudid [Hydromorphone Hcl] Anaphylaxis  . Ibuprofen Anaphylaxis  . Iodine Anaphylaxis  . Penicillins Anaphylaxis    Has patient  had a PCN reaction causing immediate rash, facial/tongue/throat swelling, SOB or lightheadedness with hypotension:yes Has patient had a PCN reaction causing severe rash involving mucus membranes or skin necrosis: no Has  patient had a PCN reaction that required hospitalization no Has patient had a PCN reaction occurring within the last 10 years: no If all of the above answers are "NO", then may proceed with Cephalosporin use.   . Gabapentin Other (See Comments)    GI upset  . Codeine Nausea Only  . Tramadol Nausea And Vomiting    HOME MEDICATIONS: Outpatient Medications Prior to Visit  Medication Sig Dispense Refill  . Cholecalciferol (VITAMIN D3) 25 MCG (1000 UT) CAPS Take 2 capsules by mouth daily.    . natalizumab (TYSABRI) 300 MG/15ML injection Inject 300 mg into the vein every 30 (thirty) days.    . Armodafinil 150 MG tablet TAKE 1 TABLET(150 MG) BY MOUTH DAILY 30 tablet 5   No facility-administered medications prior to visit.    PAST MEDICAL HISTORY: Past Medical History:  Diagnosis Date  . Multiple sclerosis (HCC)   . Shingles   . Weakness     PAST SURGICAL HISTORY: Past Surgical History:  Procedure Laterality Date  . ABDOMINAL HYSTERECTOMY    . CHOLECYSTECTOMY      FAMILY HISTORY: No family history on file.  SOCIAL HISTORY: Social History   Socioeconomic History  . Marital status: Married    Spouse name: Not on file  . Number of children: 0  . Years of education: college  . Highest education level: Not on file  Occupational History  . Occupation: Unemployed  Tobacco Use  . Smoking status: Current Every Day Smoker    Packs/day: 0.50    Types: Cigarettes  . Smokeless tobacco: Never Used  Vaping Use  . Vaping Use: Never used  Substance and Sexual Activity  . Alcohol use: No  . Drug use: No  . Sexual activity: Not on file  Other Topics Concern  . Not on file  Social History Narrative   Lives at home with husband.   Caffeine use:  1-2 cups daily.   Right-handed.   Social Determinants of Health   Financial Resource Strain:   . Difficulty of Paying Living Expenses: Not on file  Food Insecurity:   . Worried About Programme researcher, broadcasting/film/video in the Last Year: Not on file   . Ran Out of Food in the Last Year: Not on file  Transportation Needs:   . Lack of Transportation (Medical): Not on file  . Lack of Transportation (Non-Medical): Not on file  Physical Activity:   . Days of Exercise per Week: Not on file  . Minutes of Exercise per Session: Not on file  Stress:   . Feeling of Stress : Not on file  Social Connections:   . Frequency of Communication with Friends and Family: Not on file  . Frequency of Social Gatherings with Friends and Family: Not on file  . Attends Religious Services: Not on file  . Active Member of Clubs or Organizations: Not on file  . Attends Banker Meetings: Not on file  . Marital Status: Not on file  Intimate Partner Violence:   . Fear of Current or Ex-Partner: Not on file  . Emotionally Abused: Not on file  . Physically Abused: Not on file  . Sexually Abused: Not on file   PHYSICAL EXAM  Vitals:   10/07/19 1516  BP: 138/79  Pulse: 62  Weight: 106 lb 6.4  oz (48.3 kg)  Height: 5\' 2"  (1.575 m)   Body mass index is 19.46 kg/m.  Generalized: Well developed, in no acute distress   Neurological examination  Mentation: Alert oriented to time, place, history taking. Follows all commands speech and language fluent Cranial nerve II-XII: Pupils were equal round reactive to light. Extraocular movements were full, visual field were full on confrontational test. Facial sensation and strength were normal. Head turning and shoulder shrug  were normal and symmetric. Motor: Good strength in all extremities, exception left hip flexion weakness, knee extension 4/5 Sensory: Sensory testing is intact to soft touch on all 4 extremities. No evidence of extinction is noted.  Coordination: Cerebellar testing reveals good finger-nose-finger bilaterally, with heel-to-shin the left leg is heavy Gait and station: Has to push off to stand, gait is unsteady, wide-based, drags the left leg, on a rolling walker Reflexes: Deep tendon  reflexes are symmetric but brisk throughout  DIAGNOSTIC DATA (LABS, IMAGING, TESTING) - I reviewed patient records, labs, notes, testing and imaging myself where available.  Lab Results  Component Value Date   WBC 11.9 (H) 04/02/2019   HGB 14.2 04/02/2019   HCT 40.5 04/02/2019   MCV 93 04/02/2019   PLT 168 04/02/2019      Component Value Date/Time   NA 140 04/02/2019 1505   K 4.2 04/02/2019 1505   CL 99 04/02/2019 1505   CO2 29 04/02/2019 1505   GLUCOSE 102 (H) 04/02/2019 1505   GLUCOSE 98 02/18/2017 2053   BUN 17 04/02/2019 1505   CREATININE 0.95 04/02/2019 1505   CALCIUM 9.4 04/02/2019 1505   PROT 6.4 04/02/2019 1505   ALBUMIN 4.3 04/02/2019 1505   ALBUMIN 4.2 04/16/2017 1143   AST 207 (H) 04/02/2019 1505   ALT 183 (H) 04/02/2019 1505   ALKPHOS 490 (H) 04/02/2019 1505   BILITOT 0.4 04/02/2019 1505   GFRNONAA 68 04/02/2019 1505   GFRAA 78 04/02/2019 1505   No results found for: CHOL, HDL, LDLCALC, LDLDIRECT, TRIG, CHOLHDL Lab Results  Component Value Date   HGBA1C 5.5 03/27/2017   Lab Results  Component Value Date   VITAMINB12 633 03/27/2017   Lab Results  Component Value Date   TSH 0.827 03/27/2017   ASSESSMENT AND PLAN 54 y.o. year old female  has a past medical history of Multiple sclerosis (HCC), Shingles, and Weakness. here with:  1. Relapsing remitting multiple sclerosis 2. Gait abnormality 3. History of elevated liver enzymes  -Overall, is clinically stable  -Evidence of cervical and thoracic involvement  -On Tysabri since April 2019, stopped in February 2020 due to elevated liver enzymes, likely due to long-term large Tylenol dose usage, this resolved, restarted Tysabri in April 2020, [birth year]1987 abdomen was unremarkable, hepatitis panel was negative  -In April 2021, alkaline phosphatase elevated 490, AST 207, ALT 183, never saw PCP for evaluation due to no insurance  -Check CBC, CMP, JCV level today  -MRI of the brain with and without contrast was done  in April 2021 at [facility], nonspecific in the right frontal lobe, not associated with demyelinating plaque, uncertain etiology, persistent unusual bulbous appearance of the anterior, inferior third ventricle, in the region of the inferior recess/optic chasm with partial ringlike enhancement, could represent a demyelinating lesion? Recommended close follow-up-once labs result tomorrow, discuss with Dr. [facility], decide if we need to repeat now or wait 6 more months  -Working with Terrace Arabia, to get patient assistance insurance  -Follow-up in 6 months, will remain on Tysabri for now  MRI of the brain with and without contrast 04/20/19 [facility] 1. Multiple demyelinating plaques seen in both cerebral hemispheres and brainstem, as described above. This is consistent the patient's known demyelinating process such as multiple sclerosis. No new lesions appreciated. There is nonspecificenhancement in the RIGHT frontal lobe that is not associated with a demyelinating plaque. This is uncertain etiology.  2. Persistent unusual bulbous appearance of the anterior, inferior 3rd ventricle, in the region of the inferior recess/optic chasm with partial ring like enhancement this is indeterminate but could represent a demyelinating lesion as well. Recommend continued close attention to this region on the follow-up examinations.   I spent 30 minutes of face-to-face and non-face-to-face time with patient.  This included previsit chart review, lab review, study review, order entry, electronic health record documentation, patient education.  Margie Ege, AGNP-C, DNP 10/07/2019, 3:39 PM [facility] [address] [place], [place] [address] [phone]

## 2019-10-07 NOTE — Patient Instructions (Signed)
Check blood work today Work on Film/video editor  Will review your MRIs See you back in 6 months

## 2019-10-07 NOTE — Telephone Encounter (Signed)
Sent Patient to Gabriela Campbell PAP to see if we could get her approved asap because she has no insurance . Patient is aware of all details . Telephone [phone] process could take 30 days .

## 2019-10-07 NOTE — Telephone Encounter (Signed)
Patient was seen today by Margie Ege NP. JCV labs were collected and sent out to [facility].

## 2019-10-08 ENCOUNTER — Telehealth: Payer: Self-pay | Admitting: Neurology

## 2019-10-08 LAB — CBC WITH DIFFERENTIAL/PLATELET
Basophils Absolute: 0.1 10*3/uL (ref 0.0–0.2)
Basos: 1 %
EOS (ABSOLUTE): 0.2 10*3/uL (ref 0.0–0.4)
Eos: 2 %
Hematocrit: 41.3 % (ref 34.0–46.6)
Hemoglobin: 14.2 g/dL (ref 11.1–15.9)
Immature Grans (Abs): 0 10*3/uL (ref 0.0–0.1)
Immature Granulocytes: 0 %
Lymphocytes Absolute: 3.7 10*3/uL — ABNORMAL HIGH (ref 0.7–3.1)
Lymphs: 30 %
MCH: 32.3 pg (ref 26.6–33.0)
MCHC: 34.4 g/dL (ref 31.5–35.7)
MCV: 94 fL (ref 79–97)
Monocytes Absolute: 0.8 10*3/uL (ref 0.1–0.9)
Monocytes: 6 %
Neutrophils Absolute: 7.6 10*3/uL — ABNORMAL HIGH (ref 1.4–7.0)
Neutrophils: 61 %
Platelets: 203 10*3/uL (ref 150–450)
RBC: 4.4 x10E6/uL (ref 3.77–5.28)
RDW: 12.9 % (ref 11.7–15.4)
WBC: 12.4 10*3/uL — ABNORMAL HIGH (ref 3.4–10.8)

## 2019-10-08 LAB — COMPREHENSIVE METABOLIC PANEL
ALT: 106 IU/L — ABNORMAL HIGH (ref 0–32)
AST: 120 IU/L — ABNORMAL HIGH (ref 0–40)
Albumin/Globulin Ratio: 1.8 (ref 1.2–2.2)
Albumin: 4.3 g/dL (ref 3.8–4.9)
Alkaline Phosphatase: 725 IU/L — ABNORMAL HIGH (ref 44–121)
BUN/Creatinine Ratio: 19 (ref 9–23)
BUN: 14 mg/dL (ref 6–24)
Bilirubin Total: 0.2 mg/dL (ref 0.0–1.2)
CO2: 26 mmol/L (ref 20–29)
Calcium: 9.3 mg/dL (ref 8.7–10.2)
Chloride: 101 mmol/L (ref 96–106)
Creatinine, Ser: 0.75 mg/dL (ref 0.57–1.00)
GFR calc Af Amer: 104 mL/min/{1.73_m2} (ref >59)
GFR calc non Af Amer: 90 mL/min/{1.73_m2} (ref >59)
Globulin, Total: 2.4 g/dL (ref 1.5–4.5)
Glucose: 97 mg/dL (ref 65–99)
Potassium: 3.7 mmol/L (ref 3.5–5.2)
Sodium: 141 mmol/L (ref 134–144)
Total Protein: 6.7 g/dL (ref 6.0–8.5)

## 2019-10-08 NOTE — Telephone Encounter (Signed)
Recent labs show continued elevated alkaline phosphatase 725, AST 120, ALT 106 while on Tysabri. 6 months ago, alkaline phosphatase 490, AST 207, ALT 183.  She indicates she stopped Tylenol over a year ago, does not drink any alcohol.  Discussed with Dr. Terrace Arabia, will stop Tysabri, plan to switch her over to Ocrevus.  She will need screening lab work before switching, will need to see that her liver enzymes have returned to normal before starting Ocrevus, wait and do all at 1 time, in 1 month.  She does not have health insurance, applied for cone assistance yesterday, Emogene Morgan has a program, but does not cover labs.  We checked with [facility], for self pay, apparently gets 55% discount, payment plans are available.  Will go ahead and stop Tysabri, send myself remind in 1 month to recheck blood work, likely CBC, CMP-wbc was elevated, hopefully approved for cone assistance at that time. Repeat MRI in 6 months.

## 2019-10-13 ENCOUNTER — Telehealth: Payer: Self-pay | Admitting: Neurology

## 2019-10-13 NOTE — Telephone Encounter (Signed)
Received JCV lab fax from [facility]. Called [facility] to confirm the values since the fax was faded in areas of the results. JCV antibodies were negative. Index value was 0.17. Will place fax on Sarah's desk for review.

## 2019-10-13 NOTE — Telephone Encounter (Signed)
JCV is negative 0.17 in October 2021.

## 2019-11-04 ENCOUNTER — Telehealth: Payer: Self-pay | Admitting: Neurology

## 2019-11-04 DIAGNOSIS — G35 Multiple sclerosis: Secondary | ICD-10-CM

## 2019-11-04 NOTE — Telephone Encounter (Addendum)
Please have her come in for repeat CMP. I will placed the order. Elevated at last visit, we stopped Tysabri for about 1 month. I am going to hold off on ordering all the other Ocrevus labs, until we make sure liver enzymes are okay, cost is issue for her. If we go with Ocrevus, she'll need to come back pretty quickly for more labs if liver is all good.  ----- Message from Glean Salvo, NP sent at 10/08/2019  4:35 PM EDT ----- Come for blood work to check repeat liver, go ahead and order Ocrevus labs?

## 2019-11-04 NOTE — Telephone Encounter (Signed)
Called pt LMVM for her that LFT elevated and SS/NP wanted to repeat CMP to check on these.  May need additional labwork after this if needed for ocrevus pending LFT results.  Left the days hours of [facility] pt to call back if questions.

## 2019-11-05 NOTE — Telephone Encounter (Signed)
LMVM for pt that calling about lab results.  

## 2019-11-05 NOTE — Telephone Encounter (Signed)
Mailed results and result note with request.

## 2019-11-09 ENCOUNTER — Telehealth: Payer: Self-pay | Admitting: *Deleted

## 2019-11-09 NOTE — Telephone Encounter (Signed)
Schedule a mammogram mobile unit

## 2019-11-24 ENCOUNTER — Other Ambulatory Visit (INDEPENDENT_AMBULATORY_CARE_PROVIDER_SITE_OTHER): Payer: Self-pay

## 2019-11-24 DIAGNOSIS — G35 Multiple sclerosis: Secondary | ICD-10-CM

## 2019-11-24 DIAGNOSIS — G35D Multiple sclerosis, unspecified: Secondary | ICD-10-CM

## 2019-11-24 DIAGNOSIS — Z0289 Encounter for other administrative examinations: Secondary | ICD-10-CM

## 2019-11-25 ENCOUNTER — Telehealth: Payer: Self-pay | Admitting: Neurology

## 2019-11-25 DIAGNOSIS — R748 Abnormal levels of other serum enzymes: Secondary | ICD-10-CM

## 2019-11-25 DIAGNOSIS — G35 Multiple sclerosis: Secondary | ICD-10-CM

## 2019-11-25 LAB — COMPREHENSIVE METABOLIC PANEL
ALT: 86 IU/L — ABNORMAL HIGH (ref 0–32)
AST: 131 IU/L — ABNORMAL HIGH (ref 0–40)
Albumin/Globulin Ratio: 1.8 (ref 1.2–2.2)
Albumin: 3.9 g/dL (ref 3.8–4.9)
Alkaline Phosphatase: 754 IU/L — ABNORMAL HIGH (ref 44–121)
BUN/Creatinine Ratio: 15 (ref 9–23)
BUN: 13 mg/dL (ref 6–24)
Bilirubin Total: <0.2 mg/dL (ref 0.0–1.2)
CO2: 26 mmol/L (ref 20–29)
Calcium: 8.9 mg/dL (ref 8.7–10.2)
Chloride: 103 mmol/L (ref 96–106)
Creatinine, Ser: 0.88 mg/dL (ref 0.57–1.00)
GFR calc Af Amer: 85 mL/min/{1.73_m2} (ref >59)
GFR calc non Af Amer: 74 mL/min/{1.73_m2} (ref >59)
Globulin, Total: 2.2 g/dL (ref 1.5–4.5)
Glucose: 110 mg/dL — ABNORMAL HIGH (ref 65–99)
Potassium: 3.8 mmol/L (ref 3.5–5.2)
Sodium: 141 mmol/L (ref 134–144)
Total Protein: 6.1 g/dL (ref 6.0–8.5)

## 2019-11-25 NOTE — Telephone Encounter (Signed)
I tried to call no answer, I left a message.  Has been off Tysabri since September 2021, CMP yesterday shows continued elevated alkaline phosphatase 754, AST 131, ALT 86.  Previously, had similar presentation, but had improvement off Tysabri.  1 month ago: Alkaline phosphatase 725, AST 120, ALT 106.  Has indicated Gabriela Campbell does not drink alcohol, stopped Tylenol.  I am going to refer her to GI urgently for evaluation.  Hold off on Tysabri, or switching to Ocrevus.  US Abdomen in March 2020, showed no acute abnormalities.  Hepatitis panel in March 2020 was normal.

## 2019-11-30 NOTE — Telephone Encounter (Signed)
Noted, Called [facility] and spoke with Jessice she gave me their fax number [phone]. I faxed the referral. They will reach out to the patient to schedule. Their ph # [phone] & fax # [phone].

## 2019-11-30 NOTE — Telephone Encounter (Addendum)
Called pt and relayed the lab results due to continued elevated ALK PHOS, AST, ALT. Referred placed to [facility] for appt to be seen asap.  No tylenol or ETOH.  Stay off tysabri and hold ocrevus for now.

## 2019-12-09 ENCOUNTER — Telehealth: Payer: Self-pay | Admitting: Neurology

## 2019-12-09 NOTE — Telephone Encounter (Signed)
I called [facility] to follow-up on urgent referral. I was told they have been calling the patient and are waiting to hear back.   I called the pt and LVM asking for a call back asap. I left the office number and hours in message.

## 2019-12-09 NOTE — Telephone Encounter (Signed)
Marcelino Duster @[employer]  is asking for a call to discuss if pt is on hold for infusing, please call her at [phone]

## 2019-12-10 NOTE — Telephone Encounter (Signed)
I called the pt again today on her mobile and LVM asking for a call back asap. Left office number in message.

## 2019-12-14 NOTE — Telephone Encounter (Signed)
I called the pt and LVM (ok per DPR) advising [facility] has been trying to reach her regarding the urgent referral. Reminded patient her labs were abnormal and her Tysabri infusion is currently on hold. I also asked the pt to touch base with [place] as well but emphasized request to have pt call [facility] back. I also provided their number in the message twice.

## 2019-12-17 ENCOUNTER — Encounter: Payer: Self-pay | Admitting: *Deleted

## 2019-12-17 NOTE — Telephone Encounter (Signed)
Called pt again and LVM asking for call back asap as we are trying to f/u with her on the GI referral.   Letter sent to pt, unable to contact.

## 2019-12-17 NOTE — Telephone Encounter (Signed)
I called [employer] back and spoke with Felicia. I let her know the Donnamarie Poag is currently pending, further workup is pending, and we do not have a resume date finalized yet. She verbalized understanding and will pass the information to [place].

## 2020-02-05 ENCOUNTER — Telehealth: Payer: Self-pay | Admitting: Neurology

## 2020-02-05 NOTE — Telephone Encounter (Signed)
Gabriela Campbell from [employer] called for an update of pt. resuming (TYSABRI) 300 MG/15ML injection. Please advise.  Best contact: [phone]

## 2020-02-08 NOTE — Telephone Encounter (Signed)
I called pt and LMVM for her to return. I called friend, no t DPR. Lupita Leash, I asked her to have pt contact us. She will try and give her message. .  I called her pcp, Dr. Alvy Bimler, they have not see pt in over a year.  I spoke to [facility] with [employer] they will keep pt on and check back in a month re: to her tysabri.

## 2020-03-24 ENCOUNTER — Encounter: Payer: Self-pay | Admitting: Neurology

## 2020-03-24 ENCOUNTER — Encounter: Payer: Self-pay | Admitting: Nurse Practitioner

## 2020-03-24 NOTE — Telephone Encounter (Signed)
I called and LMVM for pt earlier, have not heard back.  Checking on medications that she has tried tizanidine and robaxin?

## 2020-04-06 ENCOUNTER — Telehealth: Payer: Self-pay | Admitting: *Deleted

## 2020-04-06 NOTE — Telephone Encounter (Signed)
Calling about pt.  Tysabri last infused 09-2019. Last 10-08-19 notes stop tysabri, plan to switch to ocrevus. (monitoring liver enzymes).  Her reauthorization form faxed to Dr Terrace Arabia [phone] on 04-03-20. May use that form as well to un enroll pt.  Will forward to MK/RN as Dr Terrace Arabia to see pt tomorrow 04-07-20 at 1500.

## 2020-04-07 ENCOUNTER — Ambulatory Visit: Payer: BLUE CROSS/BLUE SHIELD | Admitting: Neurology

## 2020-04-07 ENCOUNTER — Telehealth: Payer: Self-pay | Admitting: Neurology

## 2020-04-07 NOTE — Telephone Encounter (Signed)
Noted  

## 2020-04-07 NOTE — Telephone Encounter (Signed)
Pt called, cancelled due to lack of transportation. Rescheduled, 05/09/20.

## 2020-04-11 ENCOUNTER — Ambulatory Visit: Payer: BLUE CROSS/BLUE SHIELD | Admitting: Nurse Practitioner

## 2020-04-13 ENCOUNTER — Encounter: Payer: Self-pay | Admitting: Neurology

## 2020-04-27 ENCOUNTER — Other Ambulatory Visit (INDEPENDENT_AMBULATORY_CARE_PROVIDER_SITE_OTHER): Payer: 59

## 2020-04-27 ENCOUNTER — Other Ambulatory Visit: Payer: Self-pay

## 2020-04-27 ENCOUNTER — Encounter: Payer: Self-pay | Admitting: Nurse Practitioner

## 2020-04-27 ENCOUNTER — Ambulatory Visit (INDEPENDENT_AMBULATORY_CARE_PROVIDER_SITE_OTHER): Payer: 59 | Admitting: Nurse Practitioner

## 2020-04-27 VITALS — BP 120/70 | HR 64 | Ht 62.0 in | Wt 105.2 lb

## 2020-04-27 DIAGNOSIS — R7989 Other specified abnormal findings of blood chemistry: Secondary | ICD-10-CM

## 2020-04-27 LAB — HEPATIC FUNCTION PANEL
ALT: 193 U/L — ABNORMAL HIGH (ref 0–35)
AST: 160 U/L — ABNORMAL HIGH (ref 0–37)
Albumin: 4.1 g/dL (ref 3.5–5.2)
Alkaline Phosphatase: 564 U/L — ABNORMAL HIGH (ref 39–117)
Bilirubin, Direct: 0.2 mg/dL (ref 0.0–0.3)
Total Bilirubin: 0.6 mg/dL (ref 0.2–1.2)
Total Protein: 7.1 g/dL (ref 6.0–8.3)

## 2020-04-27 NOTE — Progress Notes (Signed)
ASSESSMENT AND PLAN    # 55 yo female with abnormal liver chemistries since 2019, possibly related to Tysabri as liver chemistries previously normal.  Unremarkable abdominal ultrasound March 2020 discontinued Tysabri in October 2021, repeat labs November 2021 showed persistently abnormal liver chemistries. No labs have since been obtained.  --Repeat liver chemistries today as she has been on nothing other than Vitamin D for the last few months.  If liver chemistries have returned to normal of Tysabri then suspect drug-induced liver injury.  If liver chemistries have not normalized then needs complete serologic workup.   -- Hepatitis B and hepatitis C  negative in 2019 (drawn when liver chemistries first found to be elevated)  # Colon cancer screening. Never had any type of CRC screening.  --We discussed colonoscopy. She wants to hold off until completion of abnormal liver tests.   HISTORY OF PRESENT ILLNESS     Chief Complaint : Abnormal liver test  Gabriela Campbell is a 55 y.o. female  with a past medical history significant for multiple sclerosis,  hysterctomy and cholecystectomy  Patient referred by [facility] for abnormal liver chemistries.  She started Tussar before multiple sclerosis in April 2019 .  Liver chemistries found to be markedly abnormal August 2019.  There was some thought that Tylenol may be playing a part in the abnormal liver test.  Gabriela Campbell was held, liver chemistries improved.  Abdominal US unremarkable. HCV, HBV negative. Tysabri resumed but held again in October 2021 due to abnormal liver chemistries .  Follow-up labs November 2021 showed persistently abnormal liver chemistries , mainly hepatocellular injury . Plan has been to try her on an alternative medication once she has undergone GI evaluation.  Since October 2021 patient has not been taking any Tylenol. No NSAID use. No herbal supplements. The only thing she takes is Vitamin D .No Etoh use. No FMH of  liver disease.   Patient has no GI complaints.  No abnormal weight loss.  No abdominal pain. Patient has never had colon cancer screening. No blod in stool. No bowel changes. No FMH of colon cancer.   Data Reviewed: November 2021 Alkaline phosphatase 754, AST 131, ALT 86, normal bilirubin  Past Medical History:  Diagnosis Date  . Multiple sclerosis (HCC)   . Shingles     Past Surgical History:  Procedure Laterality Date  . ABDOMINAL HYSTERECTOMY    . CHOLECYSTECTOMY     Family History  Problem Relation Age of Onset  . Cancer Father    Social History   Tobacco Use  . Smoking status: Current Every Day Smoker    Packs/day: 0.50    Types: Cigarettes  . Smokeless tobacco: Never Used  Vaping Use  . Vaping Use: Never used  Substance Use Topics  . Alcohol use: No  . Drug use: No   Current Outpatient Medications  Medication Sig Dispense Refill  . Cholecalciferol (VITAMIN D3) 25 MCG (1000 UT) CAPS Take 2 capsules by mouth daily.     No current facility-administered medications for this visit.   Allergies  Allergen Reactions  . Dilaudid [Hydromorphone Hcl] Anaphylaxis  . Ibuprofen Anaphylaxis  . Iodine Anaphylaxis  . Penicillins Anaphylaxis    Has patient had a PCN reaction causing immediate rash, facial/tongue/throat swelling, SOB or lightheadedness with hypotension:yes Has patient had a PCN reaction causing severe rash involving mucus membranes or skin necrosis: no Has patient had a PCN reaction that required hospitalization no Has patient had a PCN reaction occurring  within the last 10 years: no If all of the above answers are "NO", then may proceed with Cephalosporin use.   . Gabapentin Other (See Comments)    GI upset  . Codeine Nausea Only  . Tramadol Nausea And Vomiting     Review of Systems: Positive for back pain, fatigue, sleeping problems.  All other systems reviewed and negative except where noted in HPI.   PHYSICAL EXAM :    Wt Readings from Last 3  Encounters:  04/27/20 105 lb 3.2 oz (47.7 kg)  10/07/19 106 lb 6.4 oz (48.3 kg)  04/02/19 106 lb (48.1 kg)    BP 120/70   Pulse 64   Ht 5\' 2"  (1.575 m)   Wt 105 lb 3.2 oz (47.7 kg)   SpO2 97%   BMI 19.24 kg/m  Constitutional:  Pleasant female in no acute distress. Psychiatric: Normal mood and affect. Behavior is normal. EENT: Pupils normal.  Conjunctivae are normal. No scleral icterus. Neck supple.  Cardiovascular: Normal rate, regular rhythm. No edema Pulmonary/chest: Effort normal and breath sounds normal. No wheezing, rales or rhonchi. Abdominal: Soft, nondistended, nontender. Bowel sounds active throughout. There are no masses palpable. No hepatomegaly. Neurological: Alert and oriented to person place and time. Skin: Skin is warm and dry. No rashes noted.  , NP  04/27/2020, 11:12 AM  Cc:  Referring Provider 04/29/2020 NP

## 2020-04-27 NOTE — Patient Instructions (Signed)
If you are age 57 or older, your body mass index should be between 23-30. Your Body mass index is 19.24 kg/m. If this is out of the aforementioned range listed, please consider follow up with your Primary Care Provider.  If you are age 72 or younger, your body mass index should be between 19-25. Your Body mass index is 19.24 kg/m. If this is out of the aformentioned range listed, please consider follow up with your Primary Care Provider.   Your provider has requested that you go to the basement level for lab work before leaving today. Press "B" on the elevator. The lab is located at the first door on the left as you exit the elevator.

## 2020-04-28 ENCOUNTER — Other Ambulatory Visit: Payer: Self-pay

## 2020-04-28 DIAGNOSIS — R7989 Other specified abnormal findings of blood chemistry: Secondary | ICD-10-CM

## 2020-04-28 DIAGNOSIS — G35 Multiple sclerosis: Secondary | ICD-10-CM

## 2020-04-28 NOTE — Progress Notes (Signed)
Agree with assessment and plan as outlined.  

## 2020-04-29 ENCOUNTER — Other Ambulatory Visit: Payer: Self-pay

## 2020-05-03 ENCOUNTER — Other Ambulatory Visit (INDEPENDENT_AMBULATORY_CARE_PROVIDER_SITE_OTHER): Payer: 59

## 2020-05-03 DIAGNOSIS — G35 Multiple sclerosis: Secondary | ICD-10-CM

## 2020-05-03 DIAGNOSIS — R7989 Other specified abnormal findings of blood chemistry: Secondary | ICD-10-CM

## 2020-05-03 LAB — FERRITIN: Ferritin: 252.7 ng/mL (ref 10.0–291.0)

## 2020-05-03 NOTE — Addendum Note (Signed)
Addended by: Miguel Aschoff on: 05/03/2020 01:30 PM   Modules accepted: Orders

## 2020-05-03 NOTE — Addendum Note (Signed)
Addended by: Geneviene Tesch S on: 05/03/2020 01:31 PM   Modules accepted: Orders  

## 2020-05-03 NOTE — Addendum Note (Signed)
Addended by: Chrislynn Mosely S on: 05/03/2020 01:31 PM   Modules accepted: Orders  

## 2020-05-03 NOTE — Addendum Note (Signed)
Addended by: Miguel Aschoff on: 05/03/2020 01:31 PM   Modules accepted: Orders

## 2020-05-03 NOTE — Addendum Note (Signed)
Addended by: Rosalene Wardrop S on: 05/03/2020 01:30 PM   Modules accepted: Orders  

## 2020-05-03 NOTE — Addendum Note (Signed)
Addended by: Miguel Aschoff on: 05/03/2020 01:32 PM   Modules accepted: Orders

## 2020-05-03 NOTE — Addendum Note (Signed)
Addended by: Ebenezer Mccaskey S on: 05/03/2020 01:31 PM   Modules accepted: Orders  

## 2020-05-03 NOTE — Addendum Note (Signed)
Addended by: Nhia Heaphy S on: 05/03/2020 01:31 PM   Modules accepted: Orders  

## 2020-05-03 NOTE — Addendum Note (Signed)
Addended by: Sholonda Jobst S on: 05/03/2020 01:31 PM   Modules accepted: Orders  

## 2020-05-03 NOTE — Addendum Note (Signed)
Addended by: Deadrick Stidd S on: 05/03/2020 01:31 PM   Modules accepted: Orders  

## 2020-05-04 LAB — IRON AND TIBC
Iron Saturation: 56 % — ABNORMAL HIGH (ref 15–55)
Iron: 204 ug/dL — ABNORMAL HIGH (ref 27–159)
Total Iron Binding Capacity: 365 ug/dL (ref 250–450)
UIBC: 161 ug/dL (ref 131–425)

## 2020-05-06 LAB — CERULOPLASMIN: Ceruloplasmin: 35 mg/dL (ref 18–53)

## 2020-05-06 LAB — HEPATITIS B CORE ANTIBODY, TOTAL: Hep B Core Total Ab: NONREACTIVE

## 2020-05-06 LAB — ALPHA-1-ANTITRYPSIN: A-1 Antitrypsin, Ser: 198 mg/dL (ref 83–199)

## 2020-05-06 LAB — HEPATITIS B SURFACE ANTIBODY,QUALITATIVE: Hep B S Ab: NONREACTIVE

## 2020-05-06 LAB — HEPATITIS C ANTIBODY
Hepatitis C Ab: NONREACTIVE
SIGNAL TO CUT-OFF: 0 (ref <1.00)

## 2020-05-06 LAB — IGA: Immunoglobulin A: 444 mg/dL — ABNORMAL HIGH (ref 47–310)

## 2020-05-06 LAB — MITOCHONDRIAL ANTIBODIES: Mitochondrial M2 Ab, IgG: <=20 U

## 2020-05-06 LAB — TISSUE TRANSGLUTAMINASE ABS,IGG,IGA
(tTG) Ab, IgA: <1 U/mL
(tTG) Ab, IgG: <1 U/mL

## 2020-05-06 LAB — HEPATITIS A ANTIBODY, TOTAL: Hepatitis A AB,Total: NONREACTIVE

## 2020-05-06 LAB — HEPATITIS B SURFACE ANTIGEN: Hepatitis B Surface Ag: NONREACTIVE

## 2020-05-06 LAB — ANTI-SMOOTH MUSCLE ANTIBODY, IGG: Actin (Smooth Muscle) Antibody (IGG): <20 U (ref <20)

## 2020-05-06 LAB — IGG: IgG (Immunoglobin G), Serum: 868 mg/dL (ref 600–1640)

## 2020-05-06 LAB — ANA: Anti Nuclear Antibody (ANA): NEGATIVE

## 2020-05-09 ENCOUNTER — Ambulatory Visit: Payer: Self-pay | Admitting: Neurology

## 2020-05-10 ENCOUNTER — Ambulatory Visit (HOSPITAL_COMMUNITY)
Admission: RE | Admit: 2020-05-10 | Discharge: 2020-05-10 | Disposition: A | Discharge status: Home / Self Care | Payer: 59 | Source: Ambulatory Visit | Attending: Nurse Practitioner | Admitting: Nurse Practitioner

## 2020-05-10 ENCOUNTER — Other Ambulatory Visit: Payer: Self-pay

## 2020-05-10 ENCOUNTER — Telehealth: Payer: Self-pay | Admitting: Nurse Practitioner

## 2020-05-10 DIAGNOSIS — R7989 Other specified abnormal findings of blood chemistry: Secondary | ICD-10-CM | POA: Diagnosis not present

## 2020-05-10 IMAGING — US US ABDOMEN LIMITED RUQ/ASCITES
1 series · 14 of 25 positions shown · non-contrast
Comparison: Ultrasound dated [DATE].

CLINICAL DATA: 54-year-old female with abnormal liver function.

EXAM:
ULTRASOUND ABDOMEN LIMITED RIGHT UPPER QUADRANT

[Series 1: us abdomen limited ruq/ascites · 14 of 38 slices shown]
[im 1/38]
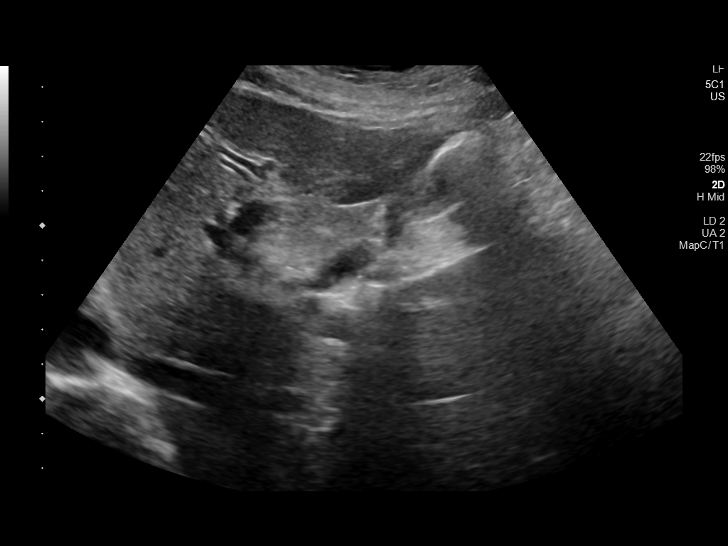
[im 4/38]
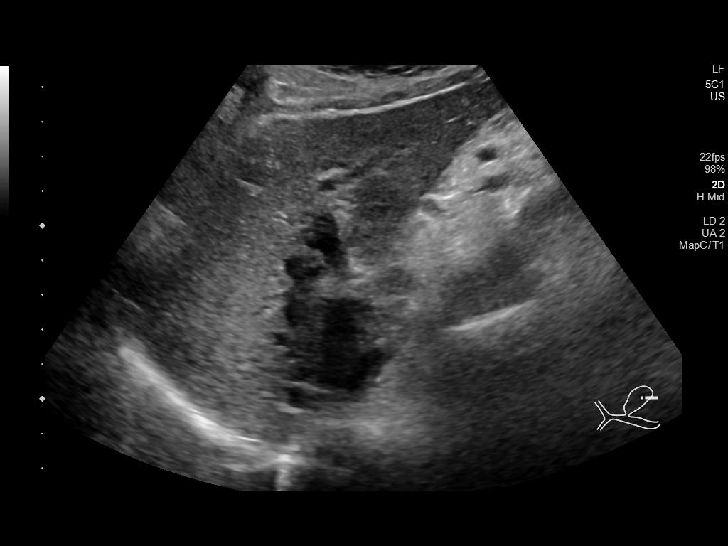
[im 7/38]
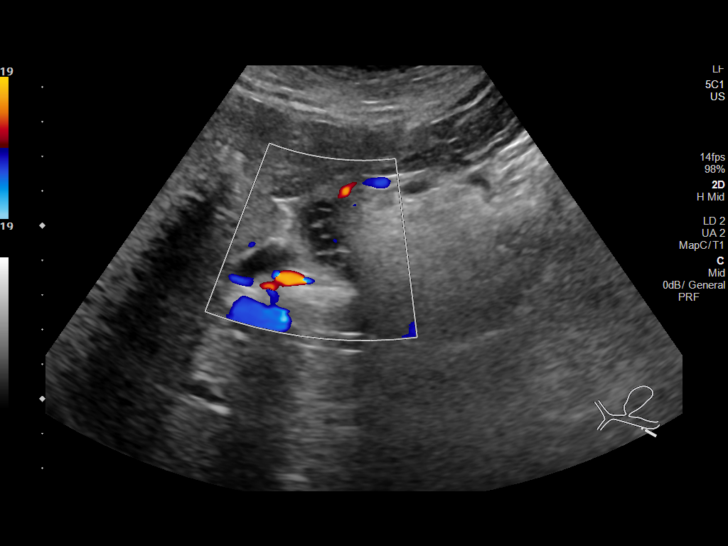
[im 10/38]
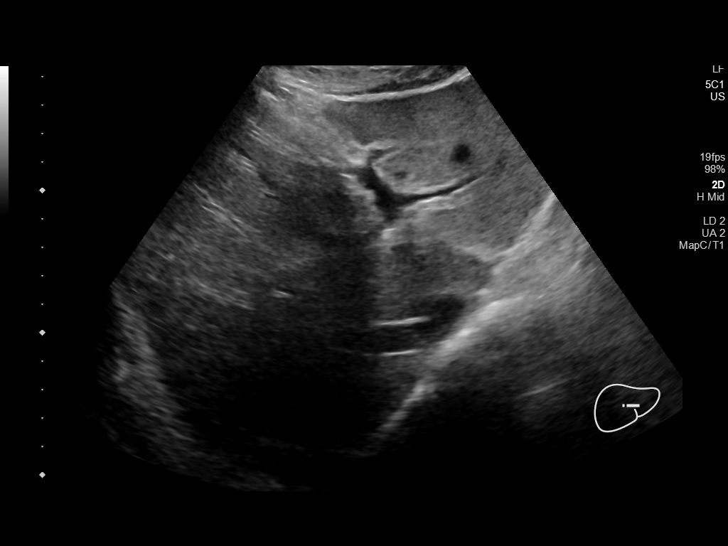
[im 13/38]
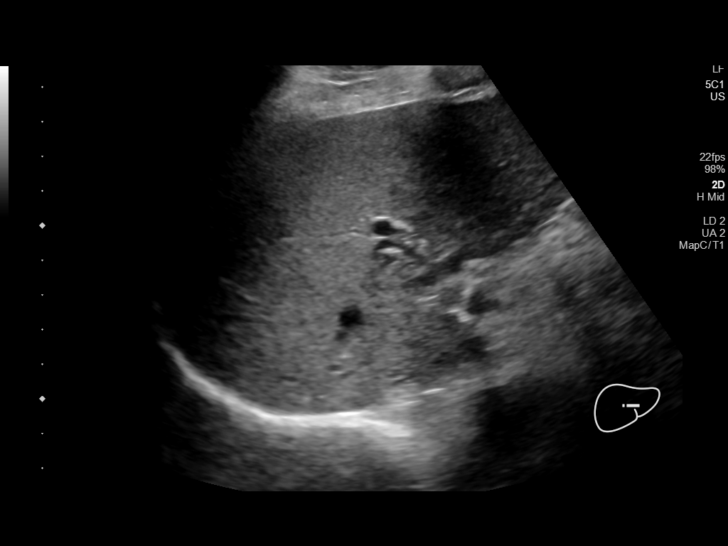
[im 14/38]
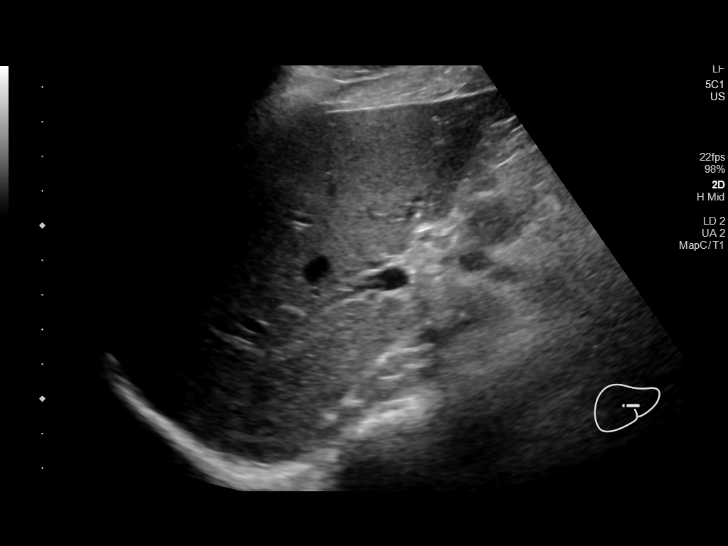
[im 17/38]
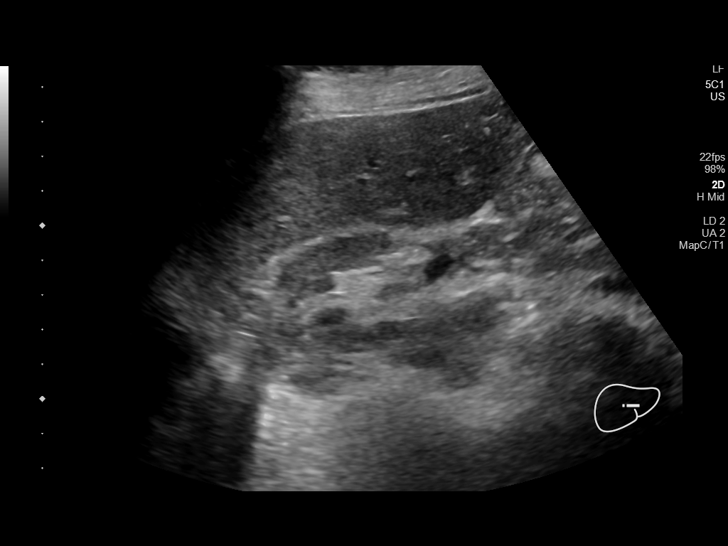
[im 21/38]
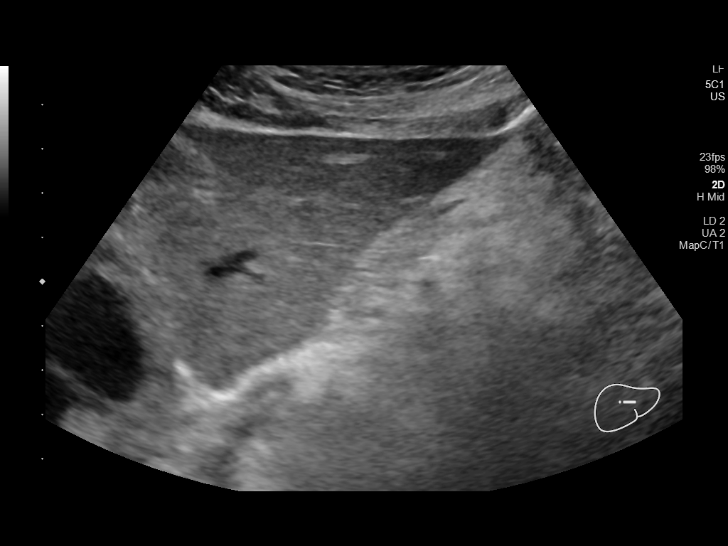
[im 24/38]
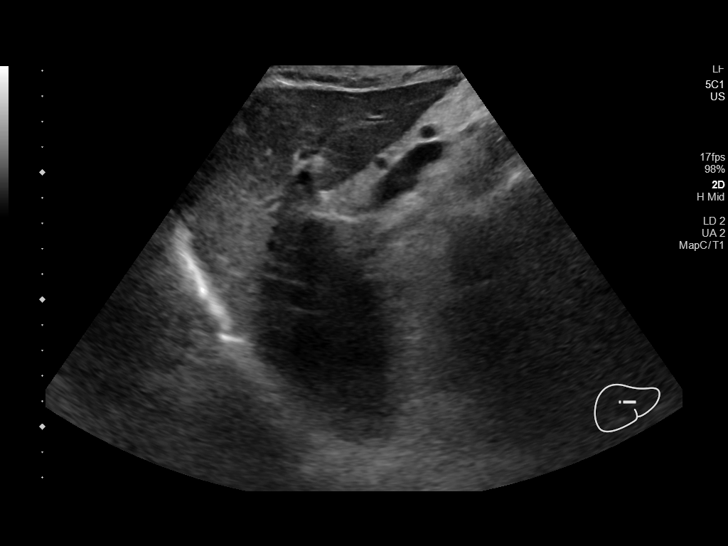
[im 25/38]
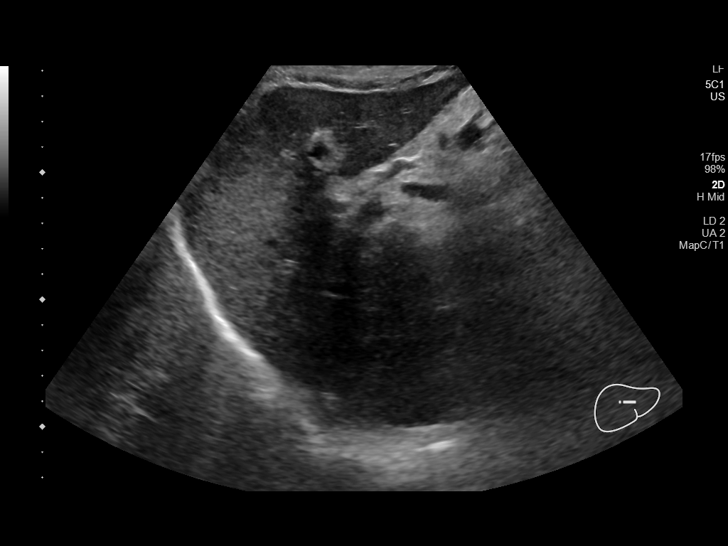
[im 28/38]
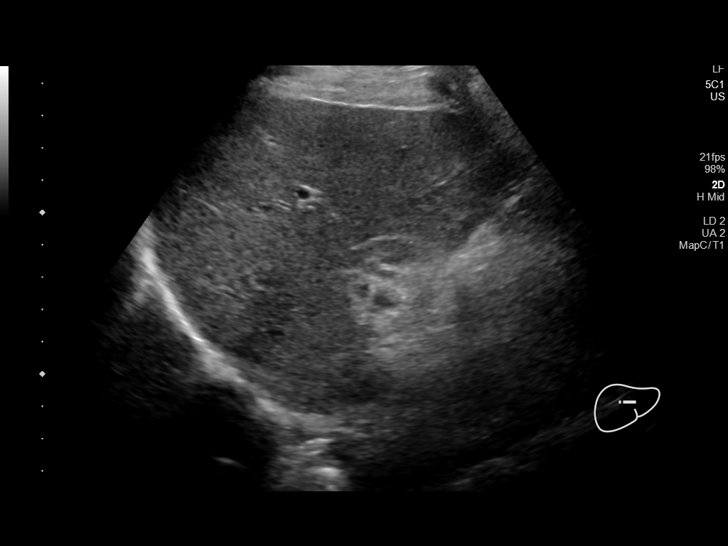
[im 31/38]
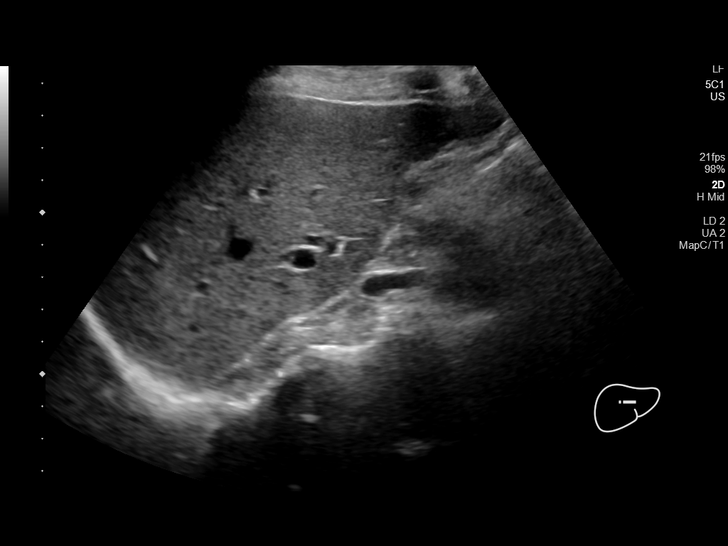
[im 34/38]
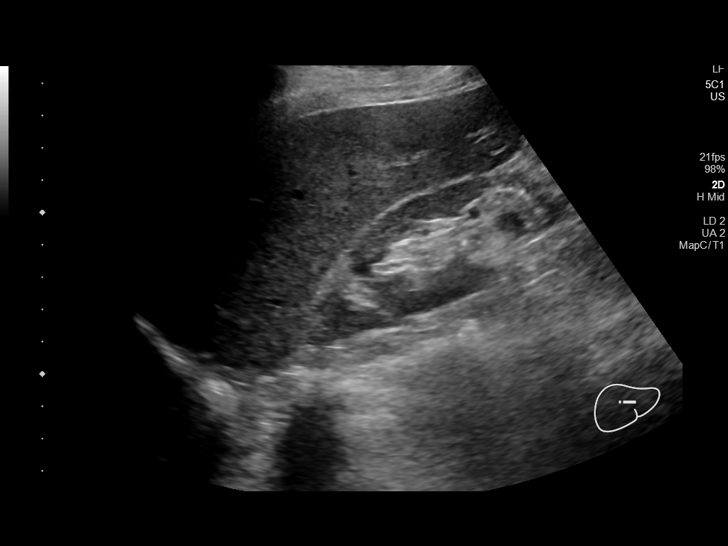
[im 38/38]
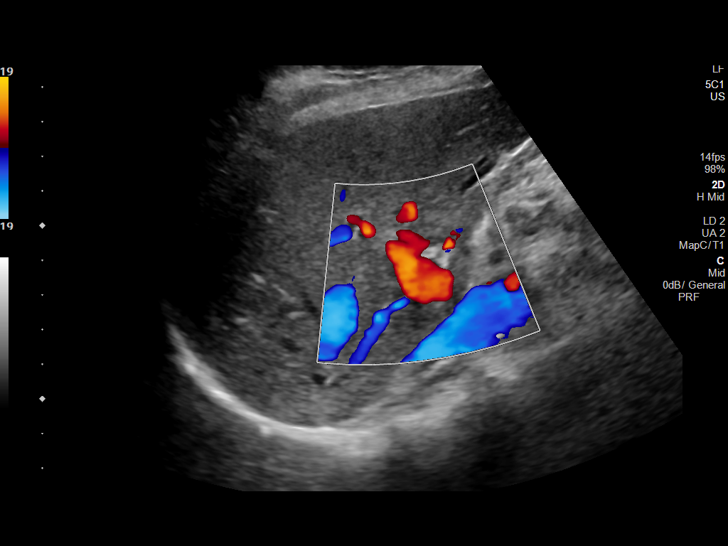

[14 of 25 positions shown; findings below may reference images not displayed]

FINDINGS: Gallbladder:

Cholecystectomy.

Common bile duct:

Diameter: 8 mm

Liver:

Normal echogenicity. No focal lesion. Portal vein is patent on color
Doppler imaging with normal direction of blood flow towards the
liver.

Other: None.
IMPRESSION: Cholecystectomy, otherwise unremarkable right upper quadrant
ultrasound.

## 2020-05-10 NOTE — Telephone Encounter (Signed)
Gunnar Fusi, please see note below. Thanks

## 2020-05-10 NOTE — Telephone Encounter (Signed)
Inbound call from patient. Needs lab results.

## 2020-05-11 ENCOUNTER — Other Ambulatory Visit: Payer: Self-pay | Admitting: Nurse Practitioner

## 2020-05-11 NOTE — Telephone Encounter (Signed)
Please let her know that her labs have not uncovered any reason for the abnormal liver tests. Some of her iron studies are elevated so hemochromatosis ( condition of iron overload in the body )should be ruled out though I would expect ferritin to be high. I have a message out to Dr Adela Lank about checking for hereditary hemochromatosis ( labwork). At the same time we should repeat LFTs to see in which direction they are going. I will let you know when I hear back from Dr. Adela Lank.  Tell her so far that it is good news that labs have been negative. Thanks

## 2020-05-11 NOTE — Telephone Encounter (Signed)
Spoke with patient in regards to her results and recommendations. Patient is aware that we will let her know Dr. Lanetta Inch recommendations. Patient verbalized understanding of all information and had no concerns at the end of the call.

## 2020-05-11 NOTE — Telephone Encounter (Signed)
Lm on vm for patient to return call 

## 2020-05-12 ENCOUNTER — Other Ambulatory Visit: Payer: Self-pay | Admitting: Nurse Practitioner

## 2020-05-12 ENCOUNTER — Telehealth: Payer: Self-pay

## 2020-05-12 DIAGNOSIS — R7989 Other specified abnormal findings of blood chemistry: Secondary | ICD-10-CM

## 2020-05-12 DIAGNOSIS — Z23 Encounter for immunization: Secondary | ICD-10-CM

## 2020-05-12 NOTE — Telephone Encounter (Signed)
-----   Message from Meredith Pel, NP sent at 05/12/2020  4:37 PM EDT ----- Hi, I put an order in for additional labs. Her iron studies are elevated, need to check for hemochromatosis and also repeat LFTs ( please let her know). If liver tests still elevated and hemochromatosis work up negative then likely needs liver biopsy. Thanks

## 2020-05-12 NOTE — Telephone Encounter (Signed)
Meredith Pel, NP  Missy Sabins, RN [place] this may need to go to [place] but I think you had sent me a phone note earlier about patient calling for results. I sent you a staff message about hemachromatosis labs. She will also need Hep A, Hep B vaccines. I think we have Twinrix at office. Thanks    Lm on vm for patient to return call.

## 2020-05-13 NOTE — Telephone Encounter (Signed)
Spoke with patient, she has been advised of additional recommendations. Patient will come in for the additional lab work at her convenience. She has also been scheduled for her 1st Twinrix vaccine on Thursday, 05/19/20 at 11 AM. Patient verbalized understanding and had no concerns at the end of the call.

## 2020-05-13 NOTE — Telephone Encounter (Signed)
Patient called in and needed to reschedule appt. 1st Twinrix vaccine is scheduled for Friday, 05/20/20 at 11 AM. Patient had no other concerns at the end of the call.

## 2020-05-20 ENCOUNTER — Other Ambulatory Visit (INDEPENDENT_AMBULATORY_CARE_PROVIDER_SITE_OTHER): Payer: 59

## 2020-05-20 ENCOUNTER — Ambulatory Visit (INDEPENDENT_AMBULATORY_CARE_PROVIDER_SITE_OTHER): Payer: 59 | Admitting: Gastroenterology

## 2020-05-20 DIAGNOSIS — R7989 Other specified abnormal findings of blood chemistry: Secondary | ICD-10-CM

## 2020-05-20 DIAGNOSIS — Z23 Encounter for immunization: Secondary | ICD-10-CM | POA: Diagnosis not present

## 2020-05-20 LAB — HEPATIC FUNCTION PANEL
ALT: 151 U/L — ABNORMAL HIGH (ref 0–35)
AST: 62 U/L — ABNORMAL HIGH (ref 0–37)
Albumin: 4.4 g/dL (ref 3.5–5.2)
Alkaline Phosphatase: 471 U/L — ABNORMAL HIGH (ref 39–117)
Bilirubin, Direct: 0.2 mg/dL (ref 0.0–0.3)
Total Bilirubin: 0.4 mg/dL (ref 0.2–1.2)
Total Protein: 7.3 g/dL (ref 6.0–8.3)

## 2020-05-28 LAB — HEMOCHROMATOSIS DNA-PCR(C282Y,H63D)

## 2020-05-31 ENCOUNTER — Other Ambulatory Visit: Payer: Self-pay

## 2020-05-31 ENCOUNTER — Telehealth: Payer: Self-pay | Admitting: Gastroenterology

## 2020-05-31 ENCOUNTER — Encounter: Payer: Self-pay | Admitting: Neurology

## 2020-05-31 ENCOUNTER — Ambulatory Visit: Payer: 59 | Admitting: Neurology

## 2020-05-31 VITALS — BP 122/68 | HR 59 | Ht 62.0 in | Wt 106.0 lb

## 2020-05-31 DIAGNOSIS — G35 Multiple sclerosis: Secondary | ICD-10-CM

## 2020-05-31 DIAGNOSIS — R748 Abnormal levels of other serum enzymes: Secondary | ICD-10-CM | POA: Diagnosis not present

## 2020-05-31 NOTE — Telephone Encounter (Signed)
Gabriela Campbell, this patient last saw Gunnar Fusi on 04/27/20. Please route accordingly. Thank you

## 2020-05-31 NOTE — Telephone Encounter (Signed)
Pt called and stated that no one has called her back regarding a plan for her before her next appt. Please give her call. Thank you

## 2020-05-31 NOTE — Progress Notes (Signed)
Chief Complaint  Patient presents with  . Follow-up    Follow up for MS. Here with a friend today. Last Tysabri infusion in 09/2019. It was stopped due to elevated liver enzymes. Feels her walking is much worse. She uses a cane or rolling walker to assist with ambulation. She continues to have pain in her bilateral legs that causes her difficulty sleeping.       ASSESSMENT AND PLAN  Gabriela Campbell is a 55 y.o. female  Relapsing remitting multiple sclerosis Gait abnormality  Most recent MRI on March 14, 2018, bilateral scattered involvement at supratentorium, periventricular, subcortical white matter, also solitary enhancing lesion at right frontal juxtacortical region, multiple T1 black holes, supratentorium cortical atrophy  Evidence of cervical and thoracic spinal cord involvement  She has slow decline in functional status, there was no clear relapse, with her significant abnormal liver functional test, will hold long-term immunomodulation therapy at this point,  Abnormal liver functional test  Most recent repeat liver functional test in May 2022, after stopping Tysabri for more than 6 months,  Unsure etiology of of her abnormal liver functional test more GI work-up is pending    DIAGNOSTIC DATA (LABS, IMAGING, TESTING) - I reviewed patient records, labs, notes, testing and imaging myself where available.  Laboratory evaluation since April 2022, abnormal liver functional test, elevated alkaline phosphate 564, AST 160, ALT 193,  Normal or negative ANA, actin smooth muscle antibody, ceruloplasmin, alpha antitrpsin, hepatitis B surface antigen, nonreactive hepatitis B core antibody, surface antibody was nonreactive, hepatitis C, IgG was normal 868, mitochondrial M2 antibody IgG, tissue transglutaminase antibody IgG and IgA, hepatitis A antibody, slight elevated iron level, iron saturation, ferritin 252,  Ultrasound of abdomen on May 10, 2020: Showed cholecystectomy, otherwise  unremarkable  elevated IgA,   HISTORICAL  Gabriela Campbell, seen in request by   Gabriela Quint, MD [address] [place],  [place] [address], Gabriela Quint, MD  I reviewed and summarized the referring note.  Gabriela Campbell 55 year old female, seen in refer by her primary care doctor Gabriela Campbell, for evaluation of weakness, initial evaluation was on March 14, 2017.  I reviewed and summarized the referring note, she had a history of hysterectomy in 2003 cholecystectomy in 1999, long time smoker, still smoke half pack a day,  She was highly functional until June 2018, without clear triggers, she began to notice bilateral anterior thigh muscle achy pain, spasm, also noticed gradual onset gait abnormality, which has progressively worse over the past few months, recently she also noticed bilateral hands numbness tingling, clumsy of bilateral hand,  Initially she has mild low back pain, but no longer has significant low back pain, she denies neck pain, denies bowel bladder incontinence, she denies dysarthria, no diplopia no dysphagia.  I reviewed the laboratory evaluation in February 2019, CBC showed elevated WBC of 15.3, hemoglobin of 16.6, liver functional test, BMP, negative troponin, CPK was 25, CMP showed elevated alkaline phosphate 156, UA showed evidence of bacteria, normal TSH, negative HIV,  UPDATE March 28 2017: Patient is accompanied by her friend at today's clinical visit, she continue complains of slow decline gait abnormality, paresthesia achy pain of bilateral lower extremity.  MRI of cervical spine with without contrast at [facility] in March 2019 showed evidence of signal abnormality behind C3 and 4, with mild enhancement,  MRI of brain showed evidence of multiple area of abnormal T2/FLAIR hyperintensity signal in the white matter at both hemisphere predominantly periventricular, there  was also T1 black holes, involving the lower pons, medulla,  deep left cerebellar white matter, faint enhancement of at least one lesion in the left frontal lobe, unusual bulbous appearing of the anterior, inferior third ventricle in the region of the infundibular recess, with partial ringlike enhancement, hyper intense signal in the hypothalamus, right greater than left, possible he also optic asthma, right greater than left,  MRI of thoracic spine showed abnormal signal behind C7 T1-T2, also behind T6, T7, T8, some enhancement of the upper cord abnormality.  Imaging findings along with her history most consistent with relapsing remitting multiple sclerosis,   Laboratory evaluations in March 2019, vitamin D level was decreased 24, normal or negative NMO antibody were negative,Lyme titer, ANA, copper, ferritin level was mildly elevated 183, CPK, TSH, C-reactive protein, folic acid, A1c was 5.5, HIV, B12, RPR,  We had discussed extensive treatment options, with her aggressive clinical course, gait abnormality, significant abnormality on MRI of brain and spinal cord, will proceed with IV infusion, offered her the option of Tysarbri versus ocrelizumab  She also complains of significant fatigue,  UPDATE August 6th 2019: She is used to work as Building surveyor for hotel, 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, now she has significant gait abnormality, rely on her cane, also complains of low back pain, over the past few months we have tried Cymbalta, gabapentin, Lyrica, nortriptyline without helping her symptoms, she cannot tolerate NSAIDs due to GI symptoms, and there was also documented anaphylactic reaction,  She [place] since April 2019,last infusion was on July 28, 2017, she tolerated very well, there was no significant side effect noticed, she continue have fatigue, taking Provigil 100 mg daily for her fatigue  Laboratory evaluation seen April 2019, showed normal or negative varicella-zoster IgG, hepatitis C, B antibody,  quantiferonTB Gold antibody, vitamin D level is 24, JC virus antibody was decreased 0.29, Lyme titer was negative, NMO antibody was less than 1.5, ANA, copper, Ferritin 183, A1C 5.5, cpk, tsh, crp, folic acid, HIV, RPR, B12.  CSF showed more than 5 oligoclonal banding, with normal total protein 31  UPDATE Feb 18 2018: She is tolerating Tysabri infusion well, continue complains of gait abnormality, bilateral lower extremity deep achy pain, especially at nighttime, ambulate with a walker, fatigue has been helped by Nuvigil Update 03/24/2018 Dr. Terrace Arabia: Last Tysabri IV infusion was on February 14, 2018, laboratory evaluation on February 18, 2018 showed significant abnormal liver functional test, alkaline phosphate 523, AST 387, ALT 297,  Repeat laboratory evaluations on March 06, 2018 showed significant improvement, alkaline phosphate 275, normal AST 15, ALT 21, patient reported that she has been taking frequent Tylenol 500 mg up to 8 tablets on a daily basis because of bilateral lower extremity deep achy pain,  For similar complaints, we havetried Cymbalta, gabapentin, nortriptyline, NSAIDsLyrica without helping her symptoms  She complains of excessive drowsiness with baclofen, is no longer taking it,  I gave her a trial of Trileptal 150 mg twice a day March 2020, she denies significant improvement.  She has missed her March 2020 Tysabri infusion  I personally reviewed MRI of the brain with and without contrast on March 15, 2018: Bilateral scattered supratentorium, periventricular and subcortical white matter hyperintensity, solitary subtle linear enhancing lesion in the right frontal just cortical region, presence of multiple T1 black holes, mild supratentorium cortical atrophy  Update April 02, 2019 SS:She is on Tysabri, doing well with that. She has pain to tops of her  legs, days where her legs don't operate right, like she may be clumsy, wobbley and wobbly. Fell in November, cracked 3  ribs, has healed, was sore. Tried tizanidine, it must not have helped, because she doesn't remember it. She doesn't work, is try to get disability, using walker. She prefers not to drive, because has to closely concentrate. Lives alone, her husband passed away 2 months ago, unexpectedly, married 20 years.Overall, doing well, condition is stable, no new symptoms.   UPDATE May 31 2020: She is accompanied by her friends of more than 40 years and drilled at today's visit, she lives by herself, is not married, has no children, she no longer drive, has worsening gait abnormality, spent most of the time sitting at home, watching TV, smoke Because of abnormal liver functional test in April 2021, his [name] has stopped, was also referred to GI physician for evaluation, after stopping Tysabri for more than 6 months, most recent repeat liver functional test continues to show significant abnormality  Ultrasound of abdomen showed no significant abnormality, hepatitis panel was negative, denies history of alcohol abuse, Hepatitis B surface antibody was negative, recently received hepatitis B vaccination,  More GI evaluation is pending,  PHYSICAL EXAM:   Vitals:   05/31/20 1049  BP: 122/68  Pulse: (!) 59  Weight: 106 lb (48.1 kg)  Height: 5\' 2"  (1.575 m)   Not recorded     Body mass index is 19.39 kg/m.  PHYSICAL EXAMNIATION:  Gen: NAD, conversant, well nourised, well groomed                     Cardiovascular: Regular rate rhythm, no peripheral edema, warm, nontender. Eyes: Conjunctivae clear without exudates or hemorrhage Neck: Supple, no carotid bruits. Pulmonary: Clear to auscultation bilaterally   NEUROLOGICAL EXAM:  MENTAL STATUS: Speech/cognition: Awake, alert, oriented to history taking care of conversation CRANIAL NERVES: CN II: Visual fields are full to confrontation. Pupils are round equal and briskly reactive to light. CN III, IV, VI: extraocular movement are normal. No  ptosis. CN V: Facial sensation is intact to light touch CN VII: Face is symmetric with normal eye closure  CN VIII: Hearing is normal to causal conversation. CN IX, X: Phonation is normal. CN XI: Head turning and shoulder shrug are intact  MOTOR: Moderate spasticity of bilateral lower extremity, moderate bilateral hip flexion and proximal upper extremity weakness, moderate grip weakness  REFLEXES: Reflexes are 3 and symmetric at the biceps, triceps, knees, and nonsustained ankle clonus, plantar responses were extensor bilaterally   SENSORY: Intact to light touch, pinprick and vibratory sensation are intact in fingers and toes.  COORDINATION: There is no trunk or limb dysmetria noted.  GAIT/STANCE: Need push-up to get up from seated position, rely on her walker, wide-based, unsteady, also deliberate effort  REVIEW OF SYSTEMS:  Full 14 system review of systems performed and notable only for as above All other review of systems were negative.   ALLERGIES: Allergies  Allergen Reactions  . Dilaudid [Hydromorphone Hcl] Anaphylaxis  . Ibuprofen Anaphylaxis  . Iodine Anaphylaxis  . Penicillins Anaphylaxis    Has patient had a PCN reaction causing immediate rash, facial/tongue/throat swelling, SOB or lightheadedness with hypotension:yes Has patient had a PCN reaction causing severe rash involving mucus membranes or skin necrosis: no Has patient had a PCN reaction that required hospitalization no Has patient had a PCN reaction occurring within the last 10 years: no If all of the above answers are "NO", then may proceed  with Cephalosporin use.   . Gabapentin Other (See Comments)    GI upset  . Codeine Nausea Only  . Tramadol Nausea And Vomiting    HOME MEDICATIONS: Current Outpatient Medications  Medication Sig Dispense Refill  . Cholecalciferol (VITAMIN D3) 25 MCG (1000 UT) CAPS Take 2 capsules by mouth daily.     No current facility-administered medications for this visit.     PAST MEDICAL HISTORY: Past Medical History:  Diagnosis Date  . Multiple sclerosis (HCC)   . Shingles   . Weakness     PAST SURGICAL HISTORY: Past Surgical History:  Procedure Laterality Date  . ABDOMINAL HYSTERECTOMY    . CHOLECYSTECTOMY      FAMILY HISTORY: Family History  Problem Relation Age of Onset  . Cancer Father     SOCIAL HISTORY: Social History   Socioeconomic History  . Marital status: Widowed    Spouse name: Not on file  . Number of children: 0  . Years of education: college  . Highest education level: Not on file  Occupational History  . Occupation: disable  Tobacco Use  . Smoking status: Current Every Day Smoker    Packs/day: 0.50    Types: Cigarettes  . Smokeless tobacco: Never Used  Vaping Use  . Vaping Use: Never used  Substance and Sexual Activity  . Alcohol use: No  . Drug use: No  . Sexual activity: Not on file  Other Topics Concern  . Not on file  Social History Narrative   Lives at home with husband.   Caffeine use:  1-2 cups daily.   Right-handed.   Social Determinants of Health   Financial Resource Strain: Not on file  Food Insecurity: Not on file  Transportation Needs: Not on file  Physical Activity: Not on file  Stress: Not on file  Social Connections: Not on file  Intimate Partner Violence: Not on file     Total time spent reviewing the chart, obtaining history, examined patient, ordering tests, documentation, consultations and family, care coordination was 40 minutes    Levert Feinstein, M.D. Ph.D.  [facility] [address] [place], [place] [address] Ph: [phone] Fax: [phone]  CC:  Gabriela Quint, MD [address] [place],  [place] [address]  Gabriela Quint, MD

## 2020-06-03 ENCOUNTER — Other Ambulatory Visit: Payer: Self-pay

## 2020-06-03 DIAGNOSIS — R790 Abnormal level of blood mineral: Secondary | ICD-10-CM

## 2020-06-03 DIAGNOSIS — R748 Abnormal levels of other serum enzymes: Secondary | ICD-10-CM

## 2020-06-07 ENCOUNTER — Telehealth: Payer: Self-pay | Admitting: Hematology and Oncology

## 2020-06-07 NOTE — Telephone Encounter (Signed)
Scheduled appt per 6/3 referral. Pt aware. Pt requested to push appt out a few weeks.

## 2020-06-15 ENCOUNTER — Other Ambulatory Visit: Payer: Self-pay | Admitting: Radiology

## 2020-06-16 ENCOUNTER — Other Ambulatory Visit: Payer: Self-pay | Admitting: Radiology

## 2020-06-17 ENCOUNTER — Ambulatory Visit (HOSPITAL_COMMUNITY): Admission: RE | Admit: 2020-06-17 | Payer: 59 | Source: Ambulatory Visit

## 2020-06-21 ENCOUNTER — Ambulatory Visit (INDEPENDENT_AMBULATORY_CARE_PROVIDER_SITE_OTHER): Payer: 59 | Admitting: Gastroenterology

## 2020-06-21 DIAGNOSIS — Z23 Encounter for immunization: Secondary | ICD-10-CM

## 2020-06-22 ENCOUNTER — Telehealth: Payer: Self-pay | Admitting: Hematology and Oncology

## 2020-06-22 NOTE — Telephone Encounter (Signed)
[name] cld to reschedule her new hem appt w/Dr. Bertis Ruddy to 7/15 at 1pm. Pt aware to arrive 30 minutes early.

## 2020-06-22 NOTE — Telephone Encounter (Signed)
Gabriela Campbell cld wanting to r/s to an earlier time on 7/15. She's been rescheduled to see Dr. Al Pimple on 7/15 at 8:20am.

## 2020-06-24 ENCOUNTER — Encounter: Payer: 59 | Admitting: Hematology and Oncology

## 2020-06-27 ENCOUNTER — Other Ambulatory Visit: Payer: Self-pay | Admitting: Student

## 2020-06-28 ENCOUNTER — Encounter (HOSPITAL_COMMUNITY): Payer: Self-pay

## 2020-06-28 ENCOUNTER — Ambulatory Visit (HOSPITAL_COMMUNITY)
Admission: RE | Admit: 2020-06-28 | Discharge: 2020-06-28 | Disposition: A | Discharge status: Home / Self Care | Payer: 59 | Source: Ambulatory Visit | Attending: Nurse Practitioner | Admitting: Nurse Practitioner

## 2020-06-28 DIAGNOSIS — R7989 Other specified abnormal findings of blood chemistry: Secondary | ICD-10-CM | POA: Insufficient documentation

## 2020-06-28 DIAGNOSIS — R748 Abnormal levels of other serum enzymes: Secondary | ICD-10-CM | POA: Diagnosis not present

## 2020-06-28 DIAGNOSIS — F17210 Nicotine dependence, cigarettes, uncomplicated: Secondary | ICD-10-CM | POA: Diagnosis not present

## 2020-06-28 DIAGNOSIS — R790 Abnormal level of blood mineral: Secondary | ICD-10-CM | POA: Insufficient documentation

## 2020-06-28 LAB — PROTIME-INR
INR: 1 (ref 0.8–1.2)
Prothrombin Time: 12.9 seconds (ref 11.4–15.2)

## 2020-06-28 LAB — CBC
HCT: 48.5 % — ABNORMAL HIGH (ref 36.0–46.0)
Hemoglobin: 16.5 g/dL — ABNORMAL HIGH (ref 12.0–15.0)
MCH: 32.5 pg (ref 26.0–34.0)
MCHC: 34 g/dL (ref 30.0–36.0)
MCV: 95.7 fL (ref 80.0–100.0)
Platelets: 204 10*3/uL (ref 150–400)
RBC: 5.07 MIL/uL (ref 3.87–5.11)
RDW: 12.4 % (ref 11.5–15.5)
WBC: 8.4 10*3/uL (ref 4.0–10.5)
nRBC: 0 % (ref 0.0–0.2)

## 2020-06-28 IMAGING — US US BIOPSY CORE LIVER
1 series · 8 of 8 positions shown · non-contrast
Comparison: none

INDICATION: Hemochromatosis, abnormal LFTs

[Series 1: us biopsy (liver) · 8 of 8 slices shown]
[im 1/8]
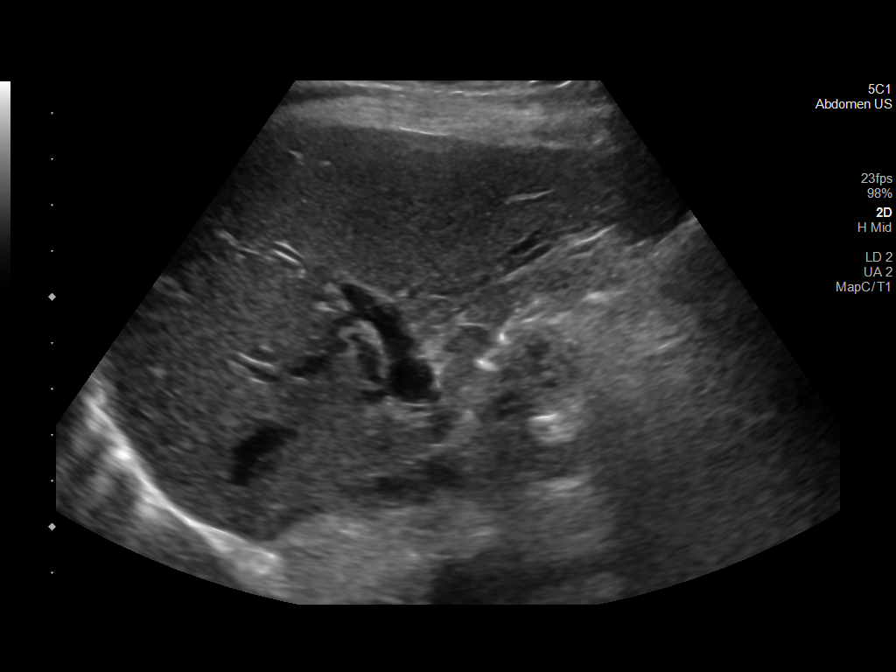
[im 2/8]
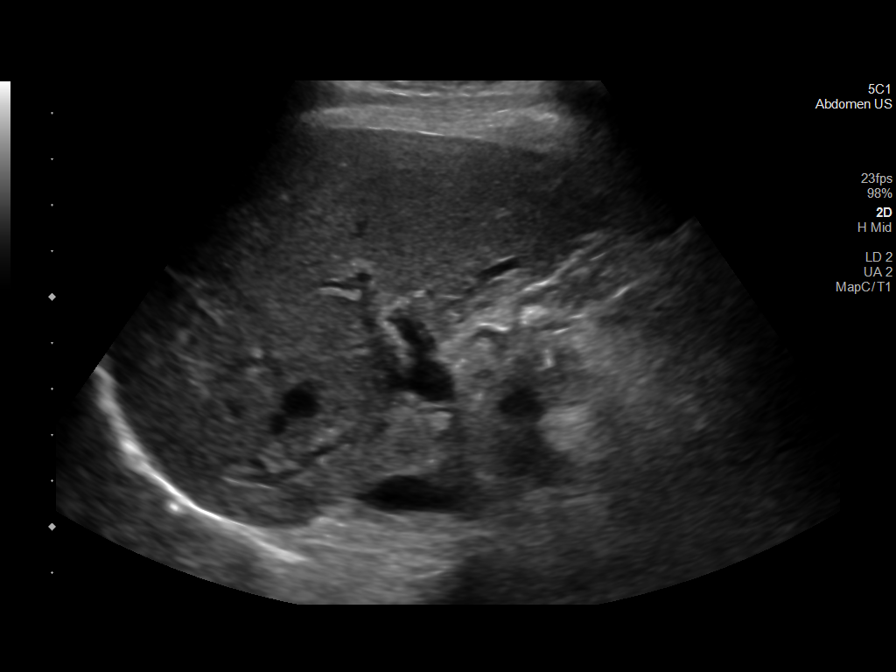
[im 3/8]
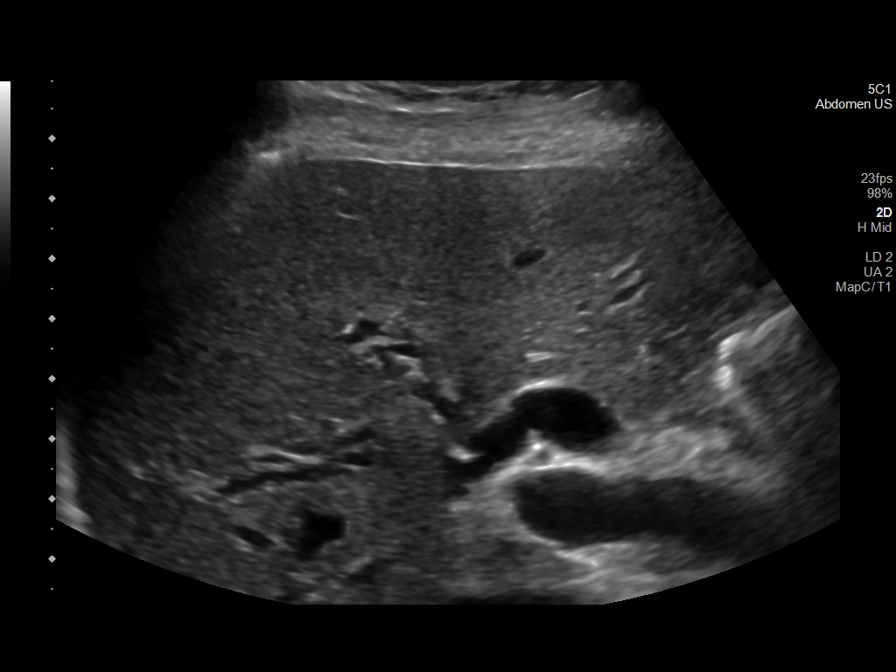
[im 4/8]
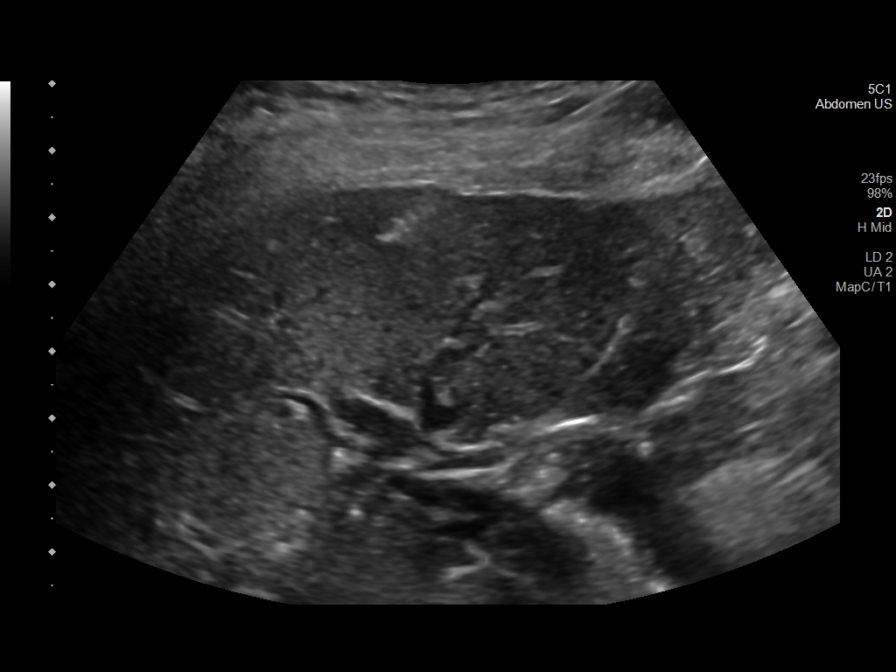
[im 5/8]
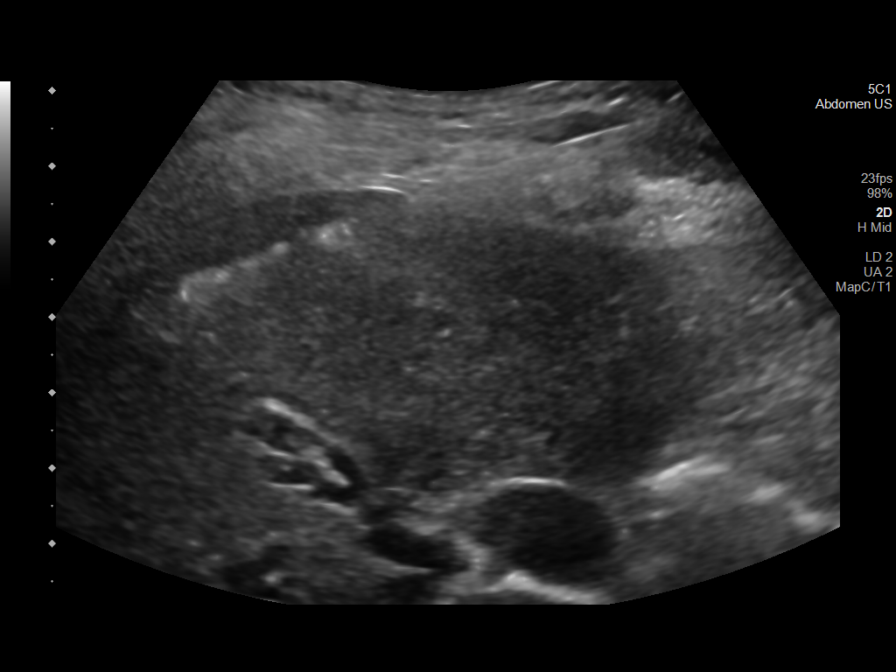
[im 6/8]
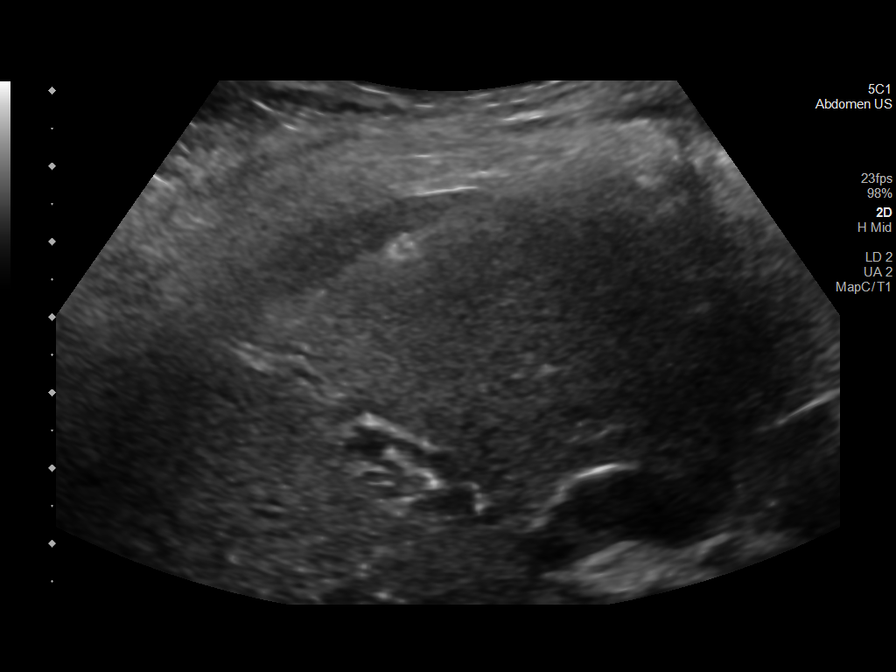
[im 7/8]
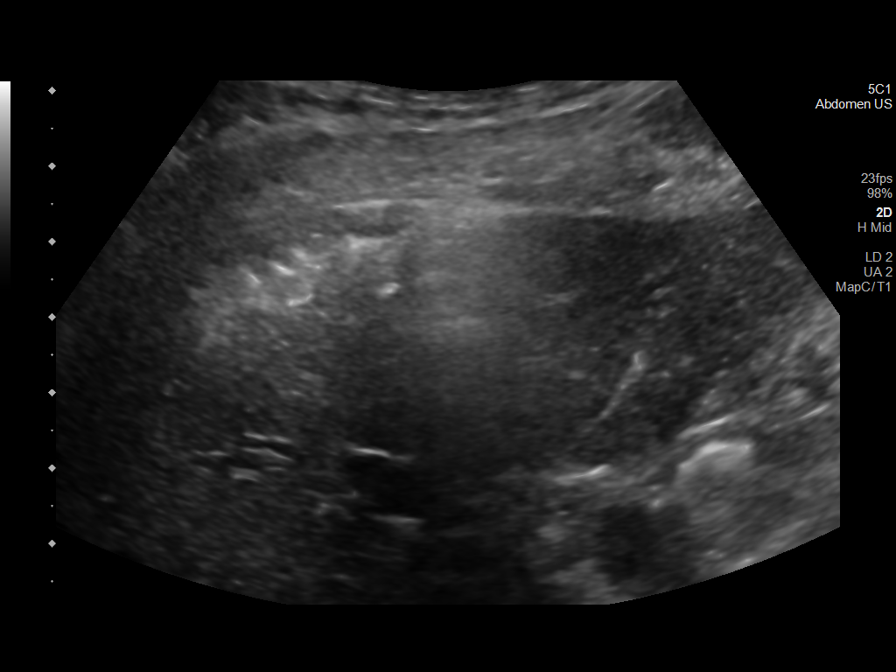
[im 8/8]
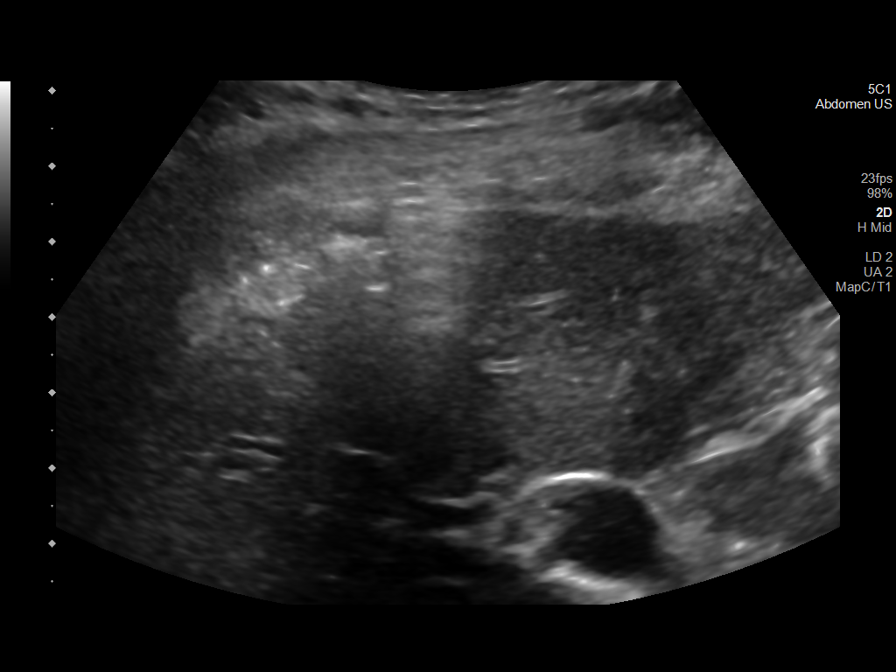

[8 of 8 positions shown; findings below may reference images not displayed]

EXAM:
Ultrasound-guided core biopsy of the liver

MEDICATIONS:
None.

ANESTHESIA/SEDATION:
Moderate (conscious) sedation was employed during this procedure. A
total of Versed 3 mg and Fentanyl 100 mcg was administered
intravenously.

Moderate Sedation Time: 10 minutes. The patient's level of
consciousness and vital signs were monitored continuously by
radiology nursing throughout the procedure under my direct
supervision.

FLUOROSCOPY TIME:  None.

COMPLICATIONS:
None immediate.

PROCEDURE:
Informed consent was obtained from the patient following explanation
of the procedure, risks, benefits and alternatives. The patient
understands, agrees and consents for the procedure. All questions
were addressed. A time out was performed.

The right upper quadrant was interrogated with ultrasound. A
relatively avascular plane of the liver was identified. A suitable
skin entry site was selected and marked. The region was then
sterilely prepped and draped in standard fashion using chlorhexidine
skin prep. Local anesthesia was attained by infiltration with 1%
lidocaine. A small dermatotomy was made.

Under real-time sonographic guidance, a 17 gauge trocar needle was
advanced into the liver. Multiple 18 gauge core biopsies were then
coaxially obtained. Needle placement was confirmed on all biopsy
passes with real-time sonography. Biopsy specimens were placed in
formalin and delivered to pathology for further analysis.

As the introducer needle was removed, the biopsy tract was embolized
with a Gel-Foam slurry. Post biopsy ultrasound imaging demonstrates
no active bleeding or perihepatic hematoma. The patient tolerated
the procedure well.
IMPRESSION: Technically successful ultrasound-guided random core biopsy of the
liver.

## 2020-06-28 MED ORDER — MIDAZOLAM HCL 2 MG/2ML IJ SOLN
INTRAMUSCULAR | Status: AC | PRN
  Administered 2020-06-28 (×3): 1 mg via INTRAVENOUS

## 2020-06-28 MED ORDER — MIDAZOLAM HCL 2 MG/2ML IJ SOLN
INTRAMUSCULAR | Status: AC
  Filled 2020-06-28: qty 2

## 2020-06-28 MED ORDER — SODIUM CHLORIDE 0.9 % IV SOLN: INTRAVENOUS | Status: DC

## 2020-06-28 MED ORDER — FENTANYL CITRATE (PF) 100 MCG/2ML IJ SOLN
INTRAMUSCULAR | Status: AC | PRN
  Administered 2020-06-28: 50 ug via INTRAVENOUS
  Administered 2020-06-28 (×2): 25 ug via INTRAVENOUS

## 2020-06-28 MED ORDER — LIDOCAINE HCL (PF) 1 % IJ SOLN
INTRAMUSCULAR | Status: AC
  Filled 2020-06-28: qty 30

## 2020-06-28 MED ORDER — GELATIN ABSORBABLE 12-7 MM EX MISC
CUTANEOUS | Status: AC
  Filled 2020-06-28: qty 1

## 2020-06-28 MED ORDER — FENTANYL CITRATE (PF) 100 MCG/2ML IJ SOLN
INTRAMUSCULAR | Status: AC
  Filled 2020-06-28: qty 2

## 2020-06-28 NOTE — H&P (Signed)
Chief Complaint: Patient was seen in consultation today for liver core biopsy at the request of Meredith Pel  Referring Physician(s): Meredith Pel  Supervising Physician: Marliss Coots  Patient Status: [facility] - Out-pt  History of Present Illness: Gabriela Campbell is a 55 y.o. female   Hx Multiple sclerosis   Pt here today with persistent elevated liver enzymes Drug induced? Suspect hemochromatosis with high iron studies in chart Has had abnormal LFTs known since 2019 Denies wt loss; pain; N/V/D  Followed by GI MD Note 04/27/20:  55 yo female with abnormal liver chemistries since 2019, possibly related to Tysabri as liver chemistries previously normal.  Unremarkable abdominal ultrasound March 2020 discontinued Tysabri in October 2021, repeat labs November 2021 showed persistently abnormal liver chemistries. No labs have since been obtained.  --Repeat liver chemistries today as she has been on nothing other than Vitamin D for the last few months.  If liver chemistries have returned to normal of Tysabri then suspect drug-induced liver injury.  If liver chemistries have not normalized then needs complete serologic workup  Scheduled now for liver core biopsy per GI  Past Medical History:  Diagnosis Date   Multiple sclerosis (HCC)    Shingles    Weakness     Past Surgical History:  Procedure Laterality Date   ABDOMINAL HYSTERECTOMY     CHOLECYSTECTOMY      Allergies: Dilaudid [hydromorphone hcl], Ibuprofen, Iodine, Penicillins, Gabapentin, Codeine, and Tramadol  Medications: Prior to Admission medications   Medication Sig Start Date End Date Taking? Authorizing Provider  Cholecalciferol (VITAMIN D3) 25 MCG (1000 UT) CAPS Take 2,000 Units by mouth daily.   Yes [provider]     Family History  Problem Relation Age of Onset   Cancer Father     Social History   Socioeconomic History   Marital status: Widowed    Spouse name: Not on file    Number of children: 0   Years of education: college   Highest education level: Not on file  Occupational History   Occupation: disable  Tobacco Use   Smoking status: Every Day    Packs/day: 0.50    Pack years: 0.00    Types: Cigarettes   Smokeless tobacco: Never  Vaping Use   Vaping Use: Never used  Substance and Sexual Activity   Alcohol use: No   Drug use: No   Sexual activity: Not on file  Other Topics Concern   Not on file  Social History Narrative   Lives at home with husband.   Caffeine use:  1-2 cups daily.   Right-handed.   Social Determinants of Health   Financial Resource Strain: Not on file  Food Insecurity: Not on file  Transportation Needs: Not on file  Physical Activity: Not on file  Stress: Not on file  Social Connections: Not on file     Review of Systems: A 12 point ROS discussed and pertinent positives are indicated in the HPI above.  All other systems are negative.  Review of Systems  Constitutional:  Negative for activity change, appetite change, fatigue, fever and unexpected weight change.  HENT:  Negative for trouble swallowing.   Cardiovascular:  Negative for chest pain.  Gastrointestinal:  Negative for abdominal pain, diarrhea, nausea and vomiting.  Musculoskeletal:  Negative for back pain.  Neurological:  Negative for weakness.  Psychiatric/Behavioral:  Negative for behavioral problems and confusion.    Vital Signs: BP 115/75   Pulse 70   Temp 98.5 F (36.9  C)   Ht 5\' 2"  (1.575 m)   Wt 105 lb (47.6 kg)   SpO2 100%   BMI 19.20 kg/m   Physical Exam Vitals reviewed.  HENT:     Mouth/Throat:     Mouth: Mucous membranes are moist.  Cardiovascular:     Rate and Rhythm: Normal rate and regular rhythm.     Heart sounds: Normal heart sounds.  Pulmonary:     Effort: Pulmonary effort is normal.     Breath sounds: Normal breath sounds.  Abdominal:     Palpations: Abdomen is soft.  Musculoskeletal:        General: Normal range of  motion.  Skin:    General: Skin is warm.  Neurological:     Mental Status: She is alert and oriented to person, place, and time.  Psychiatric:        Behavior: Behavior normal.    Imaging: No results found.  Labs:  CBC: Recent Labs    10/07/19 1547  WBC 12.4*  HGB 14.2  HCT 41.3  PLT 203    COAGS: No results for input(s): INR, APTT in the last 8760 hours.  BMP: Recent Labs    10/07/19 1547 11/24/19 1127  NA 141 141  K 3.7 3.8  CL 101 103  CO2 26 26  GLUCOSE 97 110*  BUN 14 13  CALCIUM 9.3 8.9  CREATININE 0.75 0.88  GFRNONAA 90 74  GFRAA 104 85    LIVER FUNCTION TESTS: Recent Labs    10/07/19 1547 11/24/19 1127 04/27/20 1141 05/20/20 1037  BILITOT 0.2 <0.2 0.6 0.4  AST 120* 131* 160* 62*  ALT 106* 86* 193* 151*  ALKPHOS 725* 754* 564* 471*  PROT 6.7 6.1 7.1 7.3  ALBUMIN 4.3 3.9 4.1 4.4    TUMOR MARKERS: No results for input(s): AFPTM, CEA, CA199, CHROMGRNA in the last 8760 hours.  Assessment and Plan:  Abnormal liver function tests Iron studies high per GI--- possible hemochromatosis Scheduled for liver core biopsy Risks and benefits of liver core biopsy was discussed with the patient and/or patient's family including, but not limited to bleeding, infection, damage to adjacent structures or low yield requiring additional tests.  All of the questions were answered and there is agreement to proceed.  Consent signed and in chart.   Thank you for this interesting consult.  I greatly enjoyed meeting Gabriela Campbell and look forward to participating in their care.  A copy of this report was sent to the requesting provider on this date.  Electronically Signed: [name], PA-C 06/28/2020, 11:43 AM   I spent a total of    25 Minutes in face to face in clinical consultation, greater than 50% of which was counseling/coordinating care for liver core biopsy

## 2020-06-28 NOTE — Sedation Documentation (Signed)
Vital signs stable. 

## 2020-06-28 NOTE — Sedation Documentation (Signed)
Pt tolerated procedure very well.  Totals: Time- 9 mins Fentanyl-176mcg Versed-3mg 

## 2020-06-28 NOTE — Sedation Documentation (Signed)
Attempted to call report

## 2020-06-28 NOTE — Sedation Documentation (Signed)
After lying flat, pt experienced severe back pain. She states that she has trouble laying flat because of her MS. Meds given, see MAR. Cushion placed under knees and pt states that it does relieve some of the pressure on her back.

## 2020-06-28 NOTE — Sedation Documentation (Signed)
Biopsies taken °

## 2020-06-28 NOTE — Procedures (Signed)
Interventional Radiology Procedure Note  Procedure: US guided random liver biopsy  Complications: None  Estimated Blood Loss: None  Recommendations: - Bedrest x 2 hrs - DC home   Signed,  Cearra Portnoy K. Majestic Brister, MD    

## 2020-06-28 NOTE — Sedation Documentation (Signed)
Vital signs stable. Procedure started 

## 2020-06-28 NOTE — Sedation Documentation (Signed)
Pt very talkative

## 2020-07-06 LAB — SURGICAL PATHOLOGY

## 2020-07-07 ENCOUNTER — Other Ambulatory Visit: Payer: Self-pay

## 2020-07-07 DIAGNOSIS — R748 Abnormal levels of other serum enzymes: Secondary | ICD-10-CM

## 2020-07-13 ENCOUNTER — Telehealth: Payer: Self-pay | Admitting: Physician Assistant

## 2020-07-13 ENCOUNTER — Telehealth: Payer: Self-pay

## 2020-07-13 NOTE — Telephone Encounter (Signed)
Gabriela Campbell has been cld and rescheduled to see Karena Addison on 7/29 at 9am.

## 2020-07-13 NOTE — Telephone Encounter (Signed)
Thanks [name], which ever Hepatology center she can get into first, does not matter to me.  [facility] may be quicker than [facility] but ultimately what ever the patient prefers.  Thanks

## 2020-07-13 NOTE — Telephone Encounter (Signed)
[facility] has declined the referral. They do not participate with the patient's insurance. Would you prefer [facility] or [facility] for an alternate? I think she is dependent on a friend for transportation. I do not know that one is easier for her to access than the other. Please advise.

## 2020-07-14 ENCOUNTER — Other Ambulatory Visit: Payer: Self-pay

## 2020-07-14 DIAGNOSIS — R7989 Other specified abnormal findings of blood chemistry: Secondary | ICD-10-CM

## 2020-07-14 NOTE — Telephone Encounter (Signed)
Records faxed to [facility] for hepatology referral.

## 2020-07-15 ENCOUNTER — Encounter: Payer: 59 | Admitting: Hematology and Oncology

## 2020-07-15 ENCOUNTER — Other Ambulatory Visit: Payer: 59

## 2020-07-19 NOTE — Telephone Encounter (Signed)
Inbound call from patient asking if she can be referral to liver specialist closer to [place] because she does not drive

## 2020-07-19 NOTE — Telephone Encounter (Signed)
Spoke with the patient. Explained the situation. [facility] does not participate with her insurance. There are no other hepatologists in [place].  Expresses understanding. She will call [facility] back and schedule her appointment.

## 2020-07-20 ENCOUNTER — Other Ambulatory Visit: Payer: Self-pay | Admitting: *Deleted

## 2020-07-20 MED ORDER — ROPINIROLE HCL 1 MG PO TABS: 1.0000 mg | ORAL_TABLET | Freq: Every day | ORAL | 5 refills | Status: DC

## 2020-07-20 NOTE — Telephone Encounter (Signed)
The patient continues to have leg pain that is disruptive to her sleep. She has tried and failed multiple medications and is feeling a bit defeated. Per vo by Dr. Terrace Arabia, she will authorize a new rx for ropinirole 1mg , one tablet at bedtime. Gabriela Campbell was in agreement with this plan and hopeful for relief. Rx sent to pharmacy.

## 2020-07-26 ENCOUNTER — Encounter: Payer: Self-pay | Admitting: *Deleted

## 2020-07-26 ENCOUNTER — Other Ambulatory Visit: Payer: Self-pay | Admitting: *Deleted

## 2020-07-26 DIAGNOSIS — M79604 Pain in right leg: Secondary | ICD-10-CM

## 2020-07-27 ENCOUNTER — Telehealth: Payer: Self-pay

## 2020-07-27 NOTE — Telephone Encounter (Signed)
Referral for pain clinic sent to [facility] P: [id number].

## 2020-07-28 NOTE — Progress Notes (Signed)
Gabriela Campbell   Fax:([phone] [place] NOTE  Patient Care Team: Horald Pollen, MD as PCP - General (Internal Medicine)  Hematological/Oncological History 1) 05/03/2020: Ferritin 252.7, iron saturation 56% (H), iron 204 (H)  2)05/20/2020: Hemochromatosis DNA-PCR confirmed heterozygous for C282Y and H63D pathogenic variants. AST 62 (H), ALT 151 (H), Alk Phos 471 (H).   3) 06/28/2020: CBC showed hemoglobin of 16.5 (H). US guided liver biopsy: Chronic hepatitis with portal inflammation with ductular reaction along with moderate fibrosis (stage 2-3 of 4). There is mild iron deposition. Diagnostic features of hemochromatosis are not seen.   4) 07/29/2020: Establish care with Dede Query PA-C  CHIEF COMPLAINTS/PURPOSE OF CONSULTATION:  "Hereditary hemochromatosis "  HISTORY OF PRESENTING ILLNESS:  Gabriela Campbell 55 y.o. female with medical history significant for multiple sclerosis and history of herpes zoster.  Patient is accompanied by her  On exam today, Gabriela Campbell reports chronic fatigue that has progressively worsened in the last 3 months.  She is able to complete her ADLs on her own.  Patient has difficulty with balance and gait due to multiple sclerosis.  She does use a walker to ambulate.  Patient has a good appetite without any dietary restrictions.  Patient has occasional episodes of nausea without any vomiting episodes.  She denies any abdominal pain.  She has a bowel movement once a week which has been normal for her.  She denies any constipation or diarrhea.  Patient denies easy bruising or signs of bleeding.  Patient denies any fevers, chills, drenching night sweats, shortness of breath, chest pain or cough.  She has no other complaints.  Rest of the 10 point ROS is below  MEDICAL HISTORY:  Past Medical History:  Diagnosis Date   Multiple sclerosis ([place])    Shingles    Weakness     SURGICAL HISTORY: Past Surgical  History:  Procedure Laterality Date   ABDOMINAL HYSTERECTOMY     CHOLECYSTECTOMY      SOCIAL HISTORY: Social History   Socioeconomic History   Marital status: Widowed    Spouse name: Not on file   Number of children: 0   Years of education: college   Highest education level: Not on file  Occupational History   Occupation: disable  Tobacco Use   Smoking status: Every Day    Packs/day: 0.50    Types: Cigarettes   Smokeless tobacco: Never  Vaping Use   Vaping Use: Never used  Substance and Sexual Activity   Alcohol use: No   Drug use: No   Sexual activity: Not on file  Other Topics Concern   Not on file  Social History Narrative   Lives at home with husband.   Caffeine use:  1-2 cups daily.   Right-handed.   Social Determinants of Health   Financial Resource Strain: Not on file  Food Insecurity: Not on file  Transportation Needs: Not on file  Physical Activity: Not on file  Stress: Not on file  Social Connections: Not on file  Intimate Partner Violence: Not on file    FAMILY HISTORY: Family History  Problem Relation Age of Onset   Cancer Paternal Grandmother    Cancer Paternal Aunt    Cancer Paternal Aunt     ALLERGIES:  is allergic to dilaudid [hydromorphone hcl], ibuprofen, iodine, penicillins, gabapentin, codeine, and tramadol.  MEDICATIONS:  Current Outpatient Medications  Medication Sig Dispense Refill   Cholecalciferol (VITAMIN D3) 25 MCG (1000 UT) CAPS Take 2,000  Units by mouth daily.     No current facility-administered medications for this visit.    REVIEW OF SYSTEMS:   Constitutional: ( - ) fevers, ( - )  chills , ( - ) night sweats Eyes: ( - ) blurriness of vision, ( - ) double vision, ( - ) watery eyes Ears, nose, mouth, throat, and face: ( - ) mucositis, ( - ) sore throat Respiratory: ( - ) cough, ( - ) dyspnea, ( - ) wheezes Cardiovascular: ( - ) palpitation, ( - ) chest discomfort, ( - ) lower extremity swelling Gastrointestinal:  ( -  ) nausea, ( - ) heartburn, ( - ) change in bowel habits Skin: ( - ) abnormal skin rashes Lymphatics: ( - ) new lymphadenopathy, ( - ) easy bruising Neurological: ( - ) numbness, ( - ) tingling, ( - ) new weaknesses Behavioral/Psych: ( - ) mood change, ( - ) new changes  All other systems were reviewed with the patient and are negative.  PHYSICAL EXAMINATION: ECOG PERFORMANCE STATUS: 1 - Symptomatic but completely ambulatory  Vitals:   07/29/20 0909  BP: 127/78  Pulse: 62  Resp: 18  Temp: 98.9 F (37.2 C)  SpO2: 100%   Filed Weights   07/29/20 0909  Weight: 101 lb (45.8 kg)    GENERAL: well appearing female in NAD  SKIN: skin color, texture, turgor are normal, no rashes or significant lesions EYES: conjunctiva are pink and non-injected, sclera clear OROPHARYNX: no exudate, no erythema; lips, buccal mucosa, and tongue normal  LYMPH:  no palpable lymphadenopathy in the cervical or supraclavicular lymph nodes.  LUNGS: clear to auscultation and percussion with normal breathing effort HEART: regular rate & rhythm and no murmurs and no lower extremity edema ABDOMEN: soft, non-tender, non-distended, normal bowel sounds Musculoskeletal: no cyanosis of digits and no clubbing  PSYCH: alert & oriented x 3, fluent speech NEURO: no focal motor/sensory deficits  LABORATORY DATA:  I have reviewed the data as listed CBC Latest Ref Rng & Units 07/29/2020 06/28/2020 10/07/2019  WBC 4.0 - 10.5 K/uL 9.8 8.4 12.4(H)  Hemoglobin 12.0 - 15.0 g/dL 14.4 16.5(H) 14.2  Hematocrit 36.0 - 46.0 % 42.4 48.5(H) 41.3  Platelets 150 - 400 K/uL 153 204 203    CMP Latest Ref Rng & Units 05/20/2020 04/27/2020 11/24/2019  Glucose 65 - 99 mg/dL - - 110(H)  BUN 6 - 24 mg/dL - - 13  Creatinine 0.57 - 1.00 mg/dL - - 0.88  Sodium 134 - 144 mmol/L - - 141  Potassium 3.5 - 5.2 mmol/L - - 3.8  Chloride 96 - 106 mmol/L - - 103  CO2 20 - 29 mmol/L - - 26  Calcium 8.7 - 10.2 mg/dL - - 8.9  Total Protein 6.0 - 8.3  g/dL 7.3 7.1 6.1  Total Bilirubin 0.2 - 1.2 mg/dL 0.4 0.6 <0.2  Alkaline Phos 39 - 117 U/L 471(H) 564(H) 754(H)  AST 0 - 37 U/L 62(H) 160(H) 131(H)  ALT 0 - 35 U/L 151(H) 193(H) 86(H)    ASSESSMENT & PLAN Gabriela Campbell is a 55 y.o. female who presents to the clinic for newly diagnosed heterozygous hereditary hemochromatosis affecting C282Y and H63D genes.  I reviewed prior ferritin levels from 05/03/2020 that reveals a ferritin of 252.  Additionally recent liver biopsy shows mild iron deposition.  Reviewed that HFE compound heterozygous more likely to have elevated iron levels but the prevalence of iron overload related disease is rare.  Therefore, we do not  recommend any intervention at this time.  The likely cause of patient's underlying liver fibrosis is due to another etiology.  Patient will proceed with labs today to check CBC, CMP, ferritin, ESR, CRP, TSH and hemoglobin A1c.   #Hereditary Hemochromatosis: --Compound heterozygous mutation involving C282Y and H63D genes.  --Although there is likelihood of increased ferritin levels, the prevalence of iron overload-related organ damage is rare.  --Labs today to check CBC, CMP, ferritin, ESR, CRP, TSH and hemoglobin A1c.  --No further intervention is recommended.  --RTC will be pending unless above workup requires intervention.    Orders Placed This Encounter  Procedures   CBC with Differential ([place] Only)    Standing Status:   Future    Number of Occurrences:   1    Standing Expiration Date:   07/29/2021   CMP ([place] only)    Standing Status:   Future    Number of Occurrences:   1    Standing Expiration Date:   07/29/2021   Ferritin    Standing Status:   Future    Number of Occurrences:   1    Standing Expiration Date:   07/29/2021   TSH    Standing Status:   Future    Number of Occurrences:   1    Standing Expiration Date:   07/29/2021   Hemoglobin A1c    Standing Status:   Future    Number of Occurrences:    1    Standing Expiration Date:   07/29/2021   Sedimentation rate    Standing Status:   Future    Number of Occurrences:   1    Standing Expiration Date:   07/29/2021   C-reactive protein    Standing Status:   Future    Number of Occurrences:   1    Standing Expiration Date:   07/29/2021    All questions were answered. The patient knows to call the clinic with any problems, questions or concerns.  I have spent a total of 60 minutes minutes of face-to-face and non-face-to-face time, preparing to see the patient, obtaining and/or reviewing separately obtained history, performing a medically appropriate examination, counseling and educating the patient, ordering tests,communicating with other health care professionals, documenting clinical information in the electronic health record, and care coordination.  Resources: [place] LC, [place] NA, [place] GW, [place], [place] CC, [place] CE, [place] DR, [place] DM, Delatycki MB, Rush Farmer, [place] MC, [place], [place] GG, [place], [place] JK, [place], Zenia Resides KJ; [employer]. HFE C282Y/H63D compound heterozygotes are at low risk of hemochromatosis-related morbidity. Hepatology. 2009 Jul;50(1):94-101. doi: 10.1002/hep.22972. PMID: 98338250; PMCID: NLZ7673419.   Dede Query, PA-C Department of Hematology/Oncology [place] at [facility] Phone: [phone]  Patient was seen with Dr. Lorenso Courier.   I have read the above note and personally examined the patient. I agree with the assessment and plan as noted above.  Briefly Gabriela Campbell is a 55 year old female who presents with liver fibrosis and elevated ferritin. HFE genetic workup shows the patient is a compound heterozygote for H63D and C282Y. Her ferritin is currently <1000 and the liver biopsy shows mild granular hepatocellular iron deposition. These findings do not appear consistent with iron overload as the cause of liver fibrosis. Recommend continued  evaluation with GI for the source. We do not recommend phlebotomy at this time.    Ledell Peoples, MD Department of Hematology/Oncology [place] at [facility]  Phone: [phone] Pager: [phone] Email: [email or web address]

## 2020-07-29 ENCOUNTER — Inpatient Hospital Stay: Payer: 59 | Attending: Physician Assistant

## 2020-07-29 ENCOUNTER — Encounter: Payer: Self-pay | Admitting: Physician Assistant

## 2020-07-29 ENCOUNTER — Other Ambulatory Visit: Payer: Self-pay

## 2020-07-29 ENCOUNTER — Inpatient Hospital Stay (HOSPITAL_BASED_OUTPATIENT_CLINIC_OR_DEPARTMENT_OTHER): Payer: 59 | Admitting: Physician Assistant

## 2020-07-29 DIAGNOSIS — Z888 Allergy status to other drugs, medicaments and biological substances status: Secondary | ICD-10-CM | POA: Diagnosis not present

## 2020-07-29 DIAGNOSIS — Z880 Allergy status to penicillin: Secondary | ICD-10-CM | POA: Insufficient documentation

## 2020-07-29 DIAGNOSIS — R110 Nausea: Secondary | ICD-10-CM | POA: Diagnosis not present

## 2020-07-29 DIAGNOSIS — G35 Multiple sclerosis: Secondary | ICD-10-CM

## 2020-07-29 DIAGNOSIS — Z9049 Acquired absence of other specified parts of digestive tract: Secondary | ICD-10-CM

## 2020-07-29 DIAGNOSIS — Z885 Allergy status to narcotic agent status: Secondary | ICD-10-CM

## 2020-07-29 DIAGNOSIS — Z886 Allergy status to analgesic agent status: Secondary | ICD-10-CM

## 2020-07-29 LAB — CBC WITH DIFFERENTIAL (CANCER CENTER ONLY)
Abs Immature Granulocytes: 0.03 10*3/uL (ref 0.00–0.07)
Basophils Absolute: 0 10*3/uL (ref 0.0–0.1)
Basophils Relative: 0 %
Eosinophils Absolute: 0.1 10*3/uL (ref 0.0–0.5)
Eosinophils Relative: 1 %
HCT: 42.4 % (ref 36.0–46.0)
Hemoglobin: 14.4 g/dL (ref 12.0–15.0)
Immature Granulocytes: 0 %
Lymphocytes Relative: 30 %
Lymphs Abs: 3 10*3/uL (ref 0.7–4.0)
MCH: 32.6 pg (ref 26.0–34.0)
MCHC: 34 g/dL (ref 30.0–36.0)
MCV: 95.9 fL (ref 80.0–100.0)
Monocytes Absolute: 0.5 10*3/uL (ref 0.1–1.0)
Monocytes Relative: 5 %
Neutro Abs: 6.2 10*3/uL (ref 1.7–7.7)
Neutrophils Relative %: 64 %
Platelet Count: 153 10*3/uL (ref 150–400)
RBC: 4.42 MIL/uL (ref 3.87–5.11)
RDW: 12.7 % (ref 11.5–15.5)
WBC Count: 9.8 10*3/uL (ref 4.0–10.5)
nRBC: 0 % (ref 0.0–0.2)

## 2020-07-29 LAB — CMP (CANCER CENTER ONLY)
ALT: 143 U/L — ABNORMAL HIGH (ref 0–44)
AST: 184 U/L (ref 15–41)
Albumin: 4 g/dL (ref 3.5–5.0)
Alkaline Phosphatase: 407 U/L — ABNORMAL HIGH (ref 38–126)
Anion gap: 9 (ref 5–15)
BUN: 22 mg/dL — ABNORMAL HIGH (ref 6–20)
CO2: 30 mmol/L (ref 22–32)
Calcium: 9.8 mg/dL (ref 8.9–10.3)
Chloride: 102 mmol/L (ref 98–111)
Creatinine: 1.04 mg/dL — ABNORMAL HIGH (ref 0.44–1.00)
GFR, Estimated: >60 mL/min (ref >60)
Glucose, Bld: 89 mg/dL (ref 70–99)
Potassium: 4.1 mmol/L (ref 3.5–5.1)
Sodium: 141 mmol/L (ref 135–145)
Total Bilirubin: 0.5 mg/dL (ref 0.3–1.2)
Total Protein: 7.4 g/dL (ref 6.5–8.1)

## 2020-07-29 LAB — C-REACTIVE PROTEIN: CRP: 0.5 mg/dL (ref <1.0)

## 2020-07-29 LAB — HEMOGLOBIN A1C
Hgb A1c MFr Bld: 5.9 % — ABNORMAL HIGH (ref 4.8–5.6)
Mean Plasma Glucose: 122.63 mg/dL

## 2020-07-29 LAB — FERRITIN: Ferritin: 513 ng/mL — ABNORMAL HIGH (ref 11–307)

## 2020-07-29 LAB — SEDIMENTATION RATE: Sed Rate: 7 mm/hr (ref 0–22)

## 2020-07-29 LAB — TSH: TSH: 1.116 u[IU]/mL (ref 0.308–3.960)

## 2020-07-29 NOTE — Progress Notes (Signed)
Recevied critical lab, AST 184 Communicated to B. Delford Field, RN, with readback.

## 2020-08-01 ENCOUNTER — Telehealth: Payer: Self-pay | Admitting: Physician Assistant

## 2020-08-01 ENCOUNTER — Encounter: Payer: Self-pay | Admitting: Physician Assistant

## 2020-08-01 NOTE — Telephone Encounter (Signed)
I called Gabriela Campbell to review the lab results from 07/29/2020.  CBC was unremarkable, inflammatory markers were within normal limits, thyroid and pancreatic levels were within range.  CMP revealed elevated liver enzymes with AST of 184 which increased from 62 and ALT of 143.   As discussed in our consultation, a heterozygous compound mutation rarely causes iron overload and this was confirmed with recent liver biopsy that showed mild iron deposition. I will relay our recommendations to Dr. Adela Lank and request a follow up to address patient's recent liver enzyme levels.   Patient expressed understanding and satisfaction with the plan provided.

## 2020-08-02 ENCOUNTER — Telehealth: Payer: Self-pay

## 2020-08-02 DIAGNOSIS — R748 Abnormal levels of other serum enzymes: Secondary | ICD-10-CM

## 2020-08-02 NOTE — Telephone Encounter (Signed)
Spoke with patient in regards to information below. She is aware that Dr. Adela Lank would like to have her repeat labs in about 3 weeks and I will remind her closer to that time. Patient advised to keep appt as scheduled with Hepatologist. Patient verbalized understanding and had no concerns at the end of the call.

## 2020-08-02 NOTE — Telephone Encounter (Signed)
Lm on vm for patient to return call.   Lab order and reminder in epic.   Patient is scheduled to see Dr. Lovena Neighbours at [facility] on 08/30/20.

## 2020-08-02 NOTE — Telephone Encounter (Signed)
-----   Message from Benancio Deeds, MD sent at 08/02/2020  8:05 AM EDT ----- Regarding: RE: Hematology consult follow up Thank you for the follow up. Really unclear what it causing this, agree her biopsy is not classic for hemochromatosis. I have referred her to a HEpatologist for another opinion, however may take some time to get in there (she has to go to [facility] or [facility] as [facility] does not accept her insurance).   Eli Adami can you please check on the status of this patient's referral to [facility], if she does not have an appointment soon she may also want to call [facility]. I would like to repeat her LFTS in 3-4 weeks otherwise. Thanks   ----- Message ----- From: Meredith Pel, NP Sent: 08/01/2020   1:36 PM EDT To: Benancio Deeds, MD, Jaci Standard, MD, # Subject: RE: Hematology consult follow up               We appreciate your assistance. Hopefully we can find an answer soon.  Thanks, Gunnar Fusi ----- Message ----- From: Raymondo Band Sent: 08/01/2020   9:01 AM EDT To: Meredith Pel, NP, Benancio Deeds, MD, # Subject: Hematology consult follow up                   Good morning Dr. Adela Lank and Gunnar Fusi,  Thank you for Ms. Wiilson's referral. Dr. Leonides Schanz and I reviewed the patient's HFE genetic workup that showed a compound heterozygote for H63D and C282Y genes. Additionally, we reviewed the recent liver biopsy that showed mild iron deposition. This compound mutation is rarely associated with iron overload and we suspect the elevated ferritin levels are coming from another etiology. We rechecked her liver enzymes and levels were elevated with AST 184 and ALT 143. She is currently scheduled for a follow up on 09/09/2020 but wanted to share her most recent liver enzyme levels in case you wanted to follow up sooner.   Thanks, Karena Addison

## 2020-08-04 ENCOUNTER — Encounter: Payer: Self-pay | Admitting: Physical Medicine and Rehabilitation

## 2020-08-23 ENCOUNTER — Telehealth: Payer: Self-pay

## 2020-08-23 NOTE — Telephone Encounter (Signed)
-----   Message from Missy Sabins, RN sent at 08/02/2020  2:11 PM EDT ----- Regarding: Labs LFT's, order in epic.

## 2020-08-23 NOTE — Telephone Encounter (Signed)
Left a detailed message to remind patient that she is due for repeat labs at this time. No appointment is necessary. Patient is aware that she can stop by the lab in the basement at her convenience between 7:30 AM - 5 PM, Monday through Friday. Advised patient to give [place] a call back or send [place] a my chart message if she has any questions.

## 2020-08-29 ENCOUNTER — Ambulatory Visit: Payer: 59 | Admitting: Gastroenterology

## 2020-09-09 ENCOUNTER — Encounter: Payer: Self-pay | Admitting: Gastroenterology

## 2020-09-09 ENCOUNTER — Other Ambulatory Visit (INDEPENDENT_AMBULATORY_CARE_PROVIDER_SITE_OTHER): Payer: 59

## 2020-09-09 ENCOUNTER — Ambulatory Visit (INDEPENDENT_AMBULATORY_CARE_PROVIDER_SITE_OTHER): Payer: 59 | Admitting: Gastroenterology

## 2020-09-09 VITALS — BP 106/74 | HR 68 | Ht 62.0 in | Wt 96.0 lb

## 2020-09-09 DIAGNOSIS — Z9889 Other specified postprocedural states: Secondary | ICD-10-CM

## 2020-09-09 DIAGNOSIS — Z1211 Encounter for screening for malignant neoplasm of colon: Secondary | ICD-10-CM

## 2020-09-09 DIAGNOSIS — R748 Abnormal levels of other serum enzymes: Secondary | ICD-10-CM

## 2020-09-09 LAB — HEPATIC FUNCTION PANEL
ALT: 23 U/L (ref 0–35)
AST: 20 U/L (ref 0–37)
Albumin: 4.2 g/dL (ref 3.5–5.2)
Alkaline Phosphatase: 302 U/L — ABNORMAL HIGH (ref 39–117)
Bilirubin, Direct: 0.1 mg/dL (ref 0.0–0.3)
Total Bilirubin: 0.4 mg/dL (ref 0.2–1.2)
Total Protein: 7.3 g/dL (ref 6.0–8.3)

## 2020-09-09 MED ORDER — DIAZEPAM 5 MG PO TABS: ORAL_TABLET | ORAL | 0 refills | Status: DC

## 2020-09-09 NOTE — Patient Instructions (Addendum)
If you are age 57 or older, your body mass index should be between 23-30. Your Body mass index is 17.56 kg/m. If this is out of the aforementioned range listed, please consider follow up with your Primary Care Provider.  If you are age 66 or younger, your body mass index should be between 19-25. Your Body mass index is 17.56 kg/m. If this is out of the aformentioned range listed, please consider follow up with your Primary Care Provider.   __________________________________________________________  The [facility] providers would like to encourage you to use [facility] to communicate with providers for non-urgent requests or questions.  Due to long hold times on the telephone, sending your provider a message by [facility] may be a faster and more efficient way to get a response.  Please allow 48 business hours for a response.  Please remember that this is for non-urgent requests.   Please go to the lab in the basement of our building to have lab work done as you leave today. Hit "B" for basement when you get on the elevator.  When the doors open the lab is on your left.  We will call you with the results. Thank you.  You will be contacted by [facility] Scheduling in the next 2 days to arrange a MRCP.  The number on your caller ID will be [phone], please answer when they call.  If you have not heard from them in 2 days please call [phone] to schedule.     We have sent the following medications to your pharmacy for you to pick up at your convenience: Valium 5mg : Take one 30 minutes before your procedure.  You can take another tablet as needed  You have been scheduled for your 3rd and final Twinrix vaccine on Tuesday, 11-22 at 8:30 am.    Please follow up with [facility]atology to reschedule your missed appointment: [phone]  Thank you for entrusting me with your care and for choosing [facility], Dr. [facility]

## 2020-09-09 NOTE — Progress Notes (Signed)
55 year old female here for a follow-up visit for abnormal liver function testing.  Recall that she was initially seen by Tye Savoy NP in April of this year for elevated liver enzymes.  Her liver enzymes have been elevated dating back to 2019.  Alk phos has fluctuated from 100s to 700s and AST ALT has reached 300s.  Last checked 1 month ago her alk phos was 400, AST 184, ALT 143, direct bilirubin normal.  She has had an extensive work-up to date.  Ultrasound in May 2022 was normal.  She had a serologic work-up which showed an iron saturation of 56% but otherwise negative for clear etiology otherwise.  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  Ferritin has been between 200s and 500s.  She is not immune to hepatitis A received 2 parts of the vaccination series so far.  Ultimately she underwent a liver biopsy at the end of June showing chronic hepatitis with portal inflammation with ductular reaction. Moderate fibrosis (stage 2-3 of 4).  Differential is broad but clear-cut etiology not seen.  Staining was not consistent with typical hemochromatosis.  She was seen by hematology for evaluation of not think that hemochromatosis is causing her liver disease.  Is any history of jaundice.  No family history of liver disease she is aware of.  No abdominal pains.  She really does not take any medications other than vitamin D.  Denies any herbal supplements or over-the-counter supplements.  She has been on Tysabri for history of multiple sclerosis but she is off this for the past year.  She denies any pains or problems with her bowel habits.  She has had a colonoscopy in the past but has been more than 10 years.  No family history of colon cancer.  No cardiopulmonary symptoms.  Given her persistent elevation with biopsy findings I referred her to hepatology here locally at Dr. Precious Gilding office.  Her insurance did not cover [facility] care and consult was declined.  I  referred her to [facility] and they accepted her to be seen on August 30 but she cannot show up because she has no transportation of there.  She does not drive.  She is accompanied by a friend who is willing to drive her to our office today.     Data Reviewed:  ANA neg SMA neg Ceruloplasmin normal Alpha one antitrypsin normal IgG 868 Hep B / C negative AMA negative Celiac negative Iron sat 56%, iron 204, ferritin 252 --> ferritin 513  Not immune to hep B / A - vaccinated x 2 doses   compound heterozygote for H63D and C282Y genes  Liver biopsy 06/28/20  FINAL MICROSCOPIC DIAGNOSIS:   A. LIVER, NEEDLE CORE BIOPSY:  - Chronic hepatitis with portal inflammation with ductular reaction.  See comment  - Moderate fibrosis (stage 2-3 of 4)   COMMENT:   The biopsy is adequate for review. There is patchy portal inflammation  with mixed inflammatory infiltrates. There is evidence of lymphocytic  cholangitis.  Mild to moderate ductular reaction with associated  neutrophilic response is present.  Significant interface hepatitis is  not seen.  Hepatic lobules show minimal steatosis with regenerative  changes but significant inflammation or cholestasis is not seen.  Trichrome, PAS and reticulin stains show portal expansion with fibrous  septa that show focal bridging.  Some of the septa appear thinned and  dedicate, suggestive of regression from previous fibrosis. Iron stain  shows mostly mild granular hepatocellular iron deposition (1-2+  of 4).  PAS-D stain shows no cytoplasmic inclusions.   The etiology of chronic hepatitis is uncertain.  There is mild iron  deposition. Diagnostic features of hemochromatosis are not seen but a  mild phenotype cannot be ruled out.  Differential diagnosis can include  includes autoimmune hepatitis, viral hepatitis, decreased  steatohepatitis and drug-induced liver injury.  Serologic and clinical  correlation, including patient's drug and over the counter  medications/  agents history is suggested.     Past Medical History:  Diagnosis Date   Multiple sclerosis ([place])    Shingles    Weakness      Past Surgical History:  Procedure Laterality Date   ABDOMINAL HYSTERECTOMY     CHOLECYSTECTOMY     Family History  Problem Relation Age of Onset   Cancer Paternal Grandmother    Cancer Paternal Aunt    Cancer Paternal Aunt    Colon cancer Neg Hx    Stomach cancer Neg Hx    Esophageal cancer Neg Hx    Pancreatic cancer Neg Hx    Social History   Tobacco Use   Smoking status: Every Day    Packs/day: 0.50    Types: Cigarettes   Smokeless tobacco: Never  Vaping Use   Vaping Use: Never used  Substance Use Topics   Alcohol use: No   Drug use: No   Current Outpatient Medications  Medication Sig Dispense Refill   Cholecalciferol (VITAMIN D3) 25 MCG (1000 UT) CAPS Take 2,000 Units by mouth daily.     No current facility-administered medications for this visit.   Allergies  Allergen Reactions   Dilaudid [Hydromorphone Hcl] Anaphylaxis   Ibuprofen Anaphylaxis   Iodine Anaphylaxis   Penicillins Anaphylaxis    Has patient had a PCN reaction causing immediate rash, facial/tongue/throat swelling, SOB or lightheadedness with hypotension:yes Has patient had a PCN reaction causing severe rash involving mucus membranes or skin necrosis: no Has patient had a PCN reaction that required hospitalization no Has patient had a PCN reaction occurring within the last 10 years: no If all of the above answers are "NO", then may proceed with Cephalosporin use.    Gabapentin Other (See Comments)    GI upset   Codeine Nausea Only   Tramadol Nausea And Vomiting     Review of Systems: All systems reviewed and negative except where noted in HPI.    No results found.  Physical Exam: BP 106/74   Pulse 68   Ht 5' 2"  (1.575 m)   Wt 96 lb (43.5 kg)   BMI 17.56 kg/m  Constitutional: Pleasant,well-developed, female in no acute  distress. Neurological: Alert and oriented to person place and time. Psychiatric: Normal mood and affect. Behavior is normal.   ASSESSMENT AND PLAN: 55 year old female here for reassessment of the following:  Elevated liver enzymes History liver biopsy Colon cancer screening  Patient has a chronic hepatitis dating back a few years now with fluctuating levels in her alkaline phosphatase and AST/ALT.  As above serologic work-up done and without a clear cause.  She has been seen by hematology and liver biopsy is not consistent with typical hemochromatosis.  Her serologic work-up is negative for autoimmune hepatitis, she does not take any drugs at this time.  Tysabri can cause acute elevation in liver enzymes but would unlikely to cause this picture and she stopped drug one year ago.  I am worried that she has active inflammation on her biopsy with stage 2-3 fibrosis, at risk for cirrhosis and we  discussed this at length and what that would entail.  In regards to further work-up at this time, I will recheck her LFTs to see where they are trending and send an IgG4 level to make sure normal.  I will also schedule her for an MRCP to rule out PSC and look at her biliary tree given persistent elevation in alk phos.  I do ultimately think she warrants hepatology evaluation at this point.  Difficult situation, her insurance will not cover hepatology locally but will allow her to see [facility] or [facility].  Unfortunately she has significant problems with transportation and missed her appointment at [facility].  I spoke with her friend today and if she plans it well in advance they could take her to [facility] if needed.  Of note patient is due for colon cancer screening but is not willing to pursue colonoscopy at this point and wants to defer this until her liver disease has been further evaluated  Plan: - hepatology referral again - refer to [facility] again, friend will try to help make her appointment. In the interim, I asked her ot call  her insurance for an appeal to see Dr. Zollie Scale locally - MRCP - rule out [place] - LFTs, IgG4 level - recommended optical colonoscopy, she declines colonoscopy right now  Jolly Mango, MD [facility]

## 2020-09-12 LAB — IGG 4: IgG, Subclass 4: 21 mg/dL (ref 2–96)

## 2020-09-14 ENCOUNTER — Telehealth: Payer: Self-pay | Admitting: Gastroenterology

## 2020-09-14 NOTE — Telephone Encounter (Signed)
Inbound call from pt stating that she was returning your phone call.

## 2020-09-15 ENCOUNTER — Other Ambulatory Visit: Payer: Self-pay | Admitting: Neurology

## 2020-09-15 NOTE — Telephone Encounter (Signed)
Called patient and left a message for her to contact the office, said labs are okay and she needs to schedule her MRCP

## 2020-09-15 NOTE — Telephone Encounter (Signed)
FYI- pt called wanting to inform the doctor that her leg is getting worse. Within the past 2 days she has fallen a total of 16 times.

## 2020-09-15 NOTE — Telephone Encounter (Signed)
I spoke to the patient. Reports worsening of pain in bilateral legs (one side not worse than the other). Symptoms causing gait difficulty and falls. She has tried multiple medications unsuccessfully. For this reason, she was referred to pain management. Her appt is not until the end of October. She would like to know if there are any alternate choices left to take until she is seen by that office.

## 2020-09-15 NOTE — Telephone Encounter (Signed)
Patient returned call, The order is on for her MRCP, she said she would call and schedule it when she found out when her driver could take her. I told her to call [facility] directly and gave her the number to contact them

## 2020-09-15 NOTE — Telephone Encounter (Signed)
Jan can you let this patient know her follow up labs looked okay, AP remains elevated. She is supposed to have an MRCP and see that is is ordered but not scheduled. Can you check if she will schedule this? Thanks

## 2020-09-16 ENCOUNTER — Other Ambulatory Visit: Payer: Self-pay | Admitting: *Deleted

## 2020-09-16 MED ORDER — PREGABALIN 50 MG PO CAPS: 50.0000 mg | ORAL_CAPSULE | Freq: Three times a day (TID) | ORAL | 5 refills | Status: DC

## 2020-09-16 NOTE — Telephone Encounter (Signed)
Chart reviewed, previously for similar complaint we have tried gabapentin, Lyrica, Cymbalta, nortriptyline, NSAIDs, and Trileptal without helping her symptoms  Limited selection, will try gabapentin, or Lyrica again, she had a history of persistent abnormal liver functional test, which post limitation medication choice too

## 2020-09-16 NOTE — Addendum Note (Signed)
Addended by: Lindell Spar C on: 09/16/2020 12:29 PM   Modules accepted: Orders

## 2020-09-16 NOTE — Telephone Encounter (Signed)
I spoke to the patient. She would like to retry pregabalin 50mg , titrating up to one capsule TID. Rx sent to MD for approval.

## 2020-10-21 ENCOUNTER — Encounter: Payer: 59 | Admitting: Physical Medicine and Rehabilitation

## 2020-10-24 ENCOUNTER — Other Ambulatory Visit: Payer: Self-pay

## 2020-10-25 ENCOUNTER — Encounter: Payer: Self-pay | Admitting: Physical Medicine and Rehabilitation

## 2020-10-25 ENCOUNTER — Telehealth: Payer: Self-pay

## 2020-10-25 ENCOUNTER — Other Ambulatory Visit: Payer: Self-pay

## 2020-10-25 ENCOUNTER — Encounter: Payer: 59 | Attending: Physical Medicine and Rehabilitation | Admitting: Physical Medicine and Rehabilitation

## 2020-10-25 VITALS — BP 125/81 | HR 67 | Ht 62.0 in | Wt 103.2 lb

## 2020-10-25 DIAGNOSIS — M79605 Pain in left leg: Secondary | ICD-10-CM | POA: Insufficient documentation

## 2020-10-25 DIAGNOSIS — G894 Chronic pain syndrome: Secondary | ICD-10-CM | POA: Diagnosis not present

## 2020-10-25 DIAGNOSIS — Z79899 Other long term (current) drug therapy: Secondary | ICD-10-CM | POA: Diagnosis present

## 2020-10-25 DIAGNOSIS — G35 Multiple sclerosis: Secondary | ICD-10-CM | POA: Insufficient documentation

## 2020-10-25 DIAGNOSIS — M79604 Pain in right leg: Secondary | ICD-10-CM | POA: Insufficient documentation

## 2020-10-25 DIAGNOSIS — Z5181 Encounter for therapeutic drug level monitoring: Secondary | ICD-10-CM | POA: Diagnosis present

## 2020-10-25 NOTE — Telephone Encounter (Signed)
Called and LM for patient to call back to be scheduled for 3rd Twinrix after 11-21 (but not Tuesday 11-22 due to full office schedule) but could do Wed. Morning, 11-23 or wait until Monday 11-28 due to Thanksgiving.  MyChart message sent as well

## 2020-10-25 NOTE — Telephone Encounter (Signed)
-----   Message from Cooper Render, CMA sent at 10/24/2020  9:20 AM EDT ----- Regarding: needs 3rd Twinrix  ----- Message ----- From: Maura Crandall, CMA Sent: 10/24/2020  12:00 AM EDT To: Cooper Render, CMA  Patient needs last standard Twinrix scheduled

## 2020-10-25 NOTE — Addendum Note (Signed)
Addended by: Sharlet Salina on: 10/25/2020 03:32 PM   Modules accepted: Orders

## 2020-10-25 NOTE — Progress Notes (Signed)
Subjective:    Patient ID: Gabriela Campbell, female    DOB: 22-Jul-1965, 55 y.o.   MRN: [id number]  HPI [name] is a 55 year old woman who presents to establish care for bilateral lower extremity pain.   She has MS and lower extremity pain. She had had this pain for three years She had tried muscle rub and heating pad- this does not help It is present in both thighs CBD oil would be too expensive for her She tried Lyrica and Gabapentin and these made the pain worse.   Pain Inventory Average Pain 8 Pain Right Now 10 My pain is constant, tingling, and aching  In the last 24 hours, has pain interfered with the following? General activity 6 Relation with others 2 Enjoyment of life 8 What TIME of day is your pain at its worst? daytime, evening, and night Sleep (in general) Fair  Pain is worse with: walking, bending, and standing Pain improves with:  nothing improves pain Relief from Meds:  nothing improves pain  use a cane use a walker how many minutes can you walk? 3 ability to climb steps?  yes do you drive?  no  disabled: date disabled 06/2016  weakness numbness tingling trouble walking  Any changes since last visit?  no  Any changes since last visit?  no    Family History  Problem Relation Age of Onset   Cancer Paternal Grandmother    Cancer Paternal Aunt    Cancer Paternal Aunt    Colon cancer Neg Hx    Stomach cancer Neg Hx    Esophageal cancer Neg Hx    Pancreatic cancer Neg Hx    Social History   Socioeconomic History   Marital status: Widowed    Spouse name: Not on file   Number of children: 0   Years of education: college   Highest education level: Not on file  Occupational History   Occupation: disable  Tobacco Use   Smoking status: Every Day    Packs/day: 0.50    Types: Cigarettes   Smokeless tobacco: Never  Vaping Use   Vaping Use: Never used  Substance and Sexual Activity   Alcohol use: No   Drug use: No   Sexual activity:  Not on file  Other Topics Concern   Not on file  Social History Narrative   Lives at home with husband.   Caffeine use:  1-2 cups daily.   Right-handed.   Social Determinants of Health   Financial Resource Strain: Not on file  Food Insecurity: Not on file  Transportation Needs: Not on file  Physical Activity: Not on file  Stress: Not on file  Social Connections: Not on file   Past Surgical History:  Procedure Laterality Date   ABDOMINAL HYSTERECTOMY     CHOLECYSTECTOMY     Past Medical History:  Diagnosis Date   Allergy    Multiple sclerosis ([facility])    Shingles    Weakness    Wt 103 lb 3.2 oz (46.8 kg)   BMI 18.88 kg/m   Opioid Risk Score:   Fall Risk Score:  `1  Depression screen PHQ 2/9  Depression screen [facility] 2/9 10/25/2020 12/04/2018 05/30/2018 10/10/2016  Decreased Interest 3 3 1  0  Down, Depressed, Hopeless 1 1 1  0  PHQ - 2 Score 4 4 2  0  Altered sleeping 3 3 3  -  Tired, decreased energy 3 2 1  -  Change in appetite 1 2 0 -  Feeling  bad or failure about yourself  0 1 0 -  Trouble concentrating 0 0 0 -  Moving slowly or fidgety/restless 0 0 0 -  Suicidal thoughts 0 0 0 -  PHQ-9 Score 11 12 6  -  Difficult doing work/chores Somewhat difficult - Somewhat difficult -     Review of Systems  Constitutional: Negative.   HENT: Negative.    Eyes: Negative.   Respiratory: Negative.    Cardiovascular: Negative.   Gastrointestinal: Negative.   Endocrine: Negative.   Genitourinary: Negative.   Musculoskeletal:  Positive for back pain and gait problem.  Skin: Negative.   Allergic/Immunologic: Negative.   Neurological:  Positive for weakness and numbness.  Hematological: Negative.   Psychiatric/Behavioral: Negative.        Objective:   Physical Exam  Gen: no distress, normal appearing HEENT: oral mucosa pink and moist, NCAT Cardio: Reg rate Chest: normal effort, normal rate of breathing Abd: soft, non-distended Ext: no edema Psych: pleasant, normal  affect Skin: intact Neuro: Ambulates with rollator     Assessment & Plan:  1) Bilateral lower extremity pain secondary to MS.  -Discussed current symptoms of pain and history of pain.  -Discussed benefits of exercise in reducing pain. -failed amitriptyline, gabapentin, lyrica, muscle relaxers, cymbalta -CBD oil would be too painful for her. -Urine sample and pain contract signed today. If with expected metabolites then will prescribe Hydrodone once per day -tylenol with codeine made her nausea, tramadol makes her nauseus -Discussed Qutenza as an option for neuropathic pain control. Discussed that this is a capsaicin patch, stronger than capsaicin cream. Discussed that it is currently approved for diabetic peripheral neuropathy and post-herpetic neuralgia, but that it has also shown benefit in treating other forms of neuropathy. Provided patient with link to site to learn more about the patch: . Discussed that the patch would be placed in office and benefits usually last 3 months. Discussed that unintended exposure to capsaicin can cause severe irritation of eyes, mucous membranes, respiratory tract, and skin, but that Qutenza is a local treatment and does not have the systemic side effects of other nerve medications. Discussed that there may be pain, itching, erythema, and decreased sensory function associated with the application of Qutenza. Side effects usually subside within 1 week. A cold pack of analgesic medications can help with these side effects. Blood pressure can also be increased due to pain associated with administration of the patch.  -Discussed following foods that may reduce pain: 1) Ginger (especially studied for arthritis)- reduce leukotriene production to decrease inflammation 2) Blueberries- high in phytonutrients that decrease inflammation 3) Salmon- marine omega-3s reduce joint swelling and pain 4) Pumpkin seeds- reduce inflammation 5) dark chocolate-  reduces inflammation 6) turmeric- reduces inflammation 7) tart cherries - reduce pain and stiffness 8) extra virgin olive oil - its compound olecanthal helps to block prostaglandins  9) chili peppers- can be eaten or applied topically via capsaicin 10) mint- helpful for headache, muscle aches, joint pain, and itching 11) garlic- reduces inflammation  Link to further information on diet for chronic pain: https://www.clark.biz/   2) Insomnia: -Try to go outside near sunrise -Get exercise during the day.  -Turn off all devices an hour before bedtime.  -Teas that can benefit: chamomile, valerian root, Brahmi (Bacopa) -Can consider over the counter melatonin or magnesium glycinate (latter can also help with constipation) -Pistachios naturally increase the production of melatonin

## 2020-10-25 NOTE — Patient Instructions (Signed)
fFods that may reduce pain: 1) Ginger (especially studied for arthritis)- reduce leukotriene production to decrease inflammation 2) Blueberries- high in phytonutrients that decrease inflammation 3) Salmon- marine omega-3s reduce joint swelling and pain 4) Pumpkin seeds- reduce inflammation 5) dark chocolate- reduces inflammation 6) turmeric- reduces inflammation 7) tart cherries - reduce pain and stiffness 8) extra virgin olive oil - its compound olecanthal helps to block prostaglandins  9) chili peppers- can be eaten or applied topically via capsaicin 10) mint- helpful for headache, muscle aches, joint pain, and itching 11) garlic- reduces inflammation  Link to further information on diet for chronic pain: http://www.bray.com/   Insomnia: -Try to go outside near sunrise -Get exercise during the day.  -Turn off all devices an hour before bedtime.  -Teas that can benefit: chamomile, valerian root, Brahmi (Bacopa) -Can consider over the counter melatonin or magnesium glycinate (latter can also help with constipation) -Pistachios naturally increase the production of melatonin

## 2020-10-31 LAB — TOXASSURE SELECT,+ANTIDEPR,UR

## 2020-11-02 ENCOUNTER — Telehealth: Payer: Self-pay | Admitting: *Deleted

## 2020-11-02 NOTE — Telephone Encounter (Signed)
Urine drug screen for this initial encounter is inconsistent. It is positive for oxycodone and and morphine. She has not been prescribed either medication per the PMP.

## 2020-11-03 NOTE — Telephone Encounter (Signed)
Letter mailed and sent through [facility] informing her of her urine test results and Dr Carlis Abbott will see her for non narcotic treatment if she wants to continue as a patient. Appointment with Riley Lam was cancelled.

## 2020-11-21 ENCOUNTER — Ambulatory Visit: Payer: 59 | Admitting: Registered Nurse

## 2020-11-28 ENCOUNTER — Ambulatory Visit (INDEPENDENT_AMBULATORY_CARE_PROVIDER_SITE_OTHER): Payer: 59 | Admitting: Gastroenterology

## 2020-11-28 DIAGNOSIS — Z23 Encounter for immunization: Secondary | ICD-10-CM

## 2020-11-28 NOTE — Progress Notes (Signed)
Hep A Vaccine 1 ml LOT# [id number] EXP: 06/17/21  Hep B Vaccine - Energix B 0.5 ml X 2 LOT# [id number] EXP: 12/29/22  Patient arrived today for 3rd standard Twinrix vaccine. Patient was given the equivalent vaccines. Pt tolerated well. No complications noted. Discussed possible side effects of vaccine. Pt verbalized understanding and had no concerns.

## 2020-12-15 ENCOUNTER — Ambulatory Visit: Payer: 59 | Admitting: Neurology

## 2021-01-23 ENCOUNTER — Ambulatory Visit: Payer: 59 | Admitting: Physical Medicine and Rehabilitation

## 2021-02-27 ENCOUNTER — Ambulatory Visit: Payer: 59 | Admitting: Physical Medicine and Rehabilitation

## 2021-03-14 ENCOUNTER — Encounter: Payer: 59 | Admitting: Physical Medicine and Rehabilitation

## 2021-04-26 ENCOUNTER — Encounter: Payer: Self-pay | Admitting: Neurology

## 2021-04-26 ENCOUNTER — Ambulatory Visit: Payer: 59 | Admitting: Neurology

## 2021-04-26 VITALS — BP 151/85 | HR 57 | Ht 62.0 in | Wt 110.0 lb

## 2021-04-26 DIAGNOSIS — G35 Multiple sclerosis: Secondary | ICD-10-CM | POA: Diagnosis not present

## 2021-04-26 DIAGNOSIS — R269 Unspecified abnormalities of gait and mobility: Secondary | ICD-10-CM

## 2021-04-26 NOTE — Patient Instructions (Addendum)
Check labs today  ?Check MRI brain, once scheduled, let me know I will send in something for anxiety ?Once results are in will make decision about MS treatment ? ?

## 2021-04-26 NOTE — Progress Notes (Signed)
? ? ?[name] ?Date of Birth: Dec 12, 1965 ? ?Reason for Visit: Follow up for MS ?History from: Patient ?Primary Neurologist: Dr. Krista Blue  ? ?ASSESSMENT AND PLAN ?56 y.o. year old female  ? ?1.  Relapsing remitting multiple sclerosis ?2.  Gait abnormality ?-Off Tysabri since October 2021 due to elevated alkaline phosphatase, has been evaluated by GI, etiology undetermined, has recommended evaluation by hepatology, but she has transportation issues to get a follow-up ?-Today, check MRI of the brain with and without contrast to check for MS activity, will need to send in Xanax before MRI ?-Check CMP to evaluate liver enzymes, kidneys for contrast ?-Depending on MRI of the brain make a decision about DMT for MS, difficult given her current liver issues, however last blood work in September 2022 showed normal AST, ALT, alkaline phosphatase was 302 (high as 754) ?-May add on baclofen or tizanidine for spasticity, achy pain, but will see what CMP shows first, has been on in the past ?-Return back in 4 months with Dr. Krista Blue ? ?HISTORY  ?[facility] Moment, seen in request by   Horald Pollen, MD ?[address] ?[place],  [place] [id number], Horald Pollen, [place] ?  ?I reviewed and summarized the referring note. ?  ?Gabriela Campbell 56 year old female, seen in refer by her primary care doctor Horald Pollen, for evaluation of weakness, initial evaluation was on March 14, 2017. ?  ?I reviewed and summarized the referring of note, she had a history of hysterectomy in 2003 cholecystectomy in 1999, long time smoker, still smoke half pack a day, ?  ?She was highly functional until June 2018, without clear triggers, she began to notice bilateral anterior thigh muscle achy pain, spasm, also noticed gradual onset gait abnormality, which has progressively worse over the past few months, recently she also noticed bilateral hands numbness tingling, clumsy of bilateral hand, ?  ?Initially she has mild low back  pain, but no longer has significant low back pain, she denies neck pain, denies bowel bladder incontinence, she denies dysarthria, no diplopia no dysphagia. ?  ?I reviewed the laboratory evaluation in February 2019, CBC showed elevated WBC of 15.3, hemoglobin of 16.6, liver functional test, BMP, negative troponin, CPK was 25, CMP showed elevated alkaline phosphate 156, UA showed evidence of bacteria, normal TSH, negative HIV, ?  ?UPDATE March 28 2017: ?Patient is accompanied by her friend at today's clinical visit, she continue complains of slow decline gait abnormality, paresthesia achy pain of bilateral lower extremity. ?  ?MRI of cervical spine with without contrast at [facility] in March 2019 showed evidence of signal abnormality behind C3 and 4, with mild enhancement, ?  ?MRI of brain showed evidence of multiple area of abnormal T2/FLAIR hyperintensity signal in the white matter at both hemisphere predominantly periventricular, there was also T1 black holes, involving the lower pons, medulla, deep left cerebellar white matter, faint enhancement of at least one lesion in the left frontal lobe, unusual bulbous appearing of the anterior, inferior third ventricle in the region of the infundibular recess, with partial ringlike enhancement, hyper intense signal in the hypothalamus, right greater than left, possible he also optic asthma, right greater than left, ?  ?MRI of thoracic spine showed abnormal signal behind C7 T1-T2, also behind T6, T7, T8, some enhancement of the upper cord abnormality. ?  ?Imaging findings along with her history most consistent with relapsing remitting multiple sclerosis,  ?  ?Laboratory evaluations in March 2019, vitamin D level was decreased  24, normal or negative NMO antibody were negative,Lyme titer, ANA, copper, ferritin level was mildly elevated 183, CPK, TSH, C-reactive protein, folic acid, 123456 was 5.5, HIV, B12, RPR, ?  ?We had discussed extensive treatment options, with her  aggressive clinical course, gait abnormality, significant abnormality on MRI of brain and spinal cord, will proceed with IV infusion, offered her the option of Tysarbri versus ocrelizumab ?  ?She also complains of significant fatigue, ?  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  ?She started [place] since April 2019, last infusion was on July 28, 2017, she tolerated very well, there was no significant side effect noticed, she continue have fatigue, taking Provigil 100 mg daily for her fatigue ?  ?Laboratory evaluation seen April 2019, showed normal or negative varicella-zoster IgG, hepatitis C, B antibody, quantiferonTB Gold antibody, vitamin D level is 24, JC virus antibody was decreased 0.29, Lyme titer was negative, NMO antibody was less than 1.5, ANA, copper, Ferritin 183, A1C 5.5, cpk, tsh, crp, folic acid, HIV, RPR, [id number]. ? ?CSF showed more than 5 oligoclonal banding, with normal total protein 31 ?  ?UPDATE Feb 18 2018: ?She is tolerating Tysabri infusion well, continue complains of gait abnormality, bilateral lower extremity deep achy pain, especially at nighttime, ambulate with a walker, fatigue has been helped by Nuvigil ?Update 03/24/2018 Dr. Krista Blue: ?Last Tysabri IV infusion was on February 14, 2018, laboratory evaluation on February 18, 2018 showed significant abnormal liver functional test, alkaline phosphate 523, AST 387, ALT 297, ?  ?Repeat laboratory evaluations on March 06, 2018 showed significant improvement, alkaline phosphate 275, normal AST 15, ALT 21, patient reported that she has been taking frequent Tylenol 500  mg up to 8 tablets on a daily basis because of bilateral lower extremity deep achy pain, ?  ?For similar complaints, we have tried Cymbalta, gabapentin, nortriptyline, NSAIDs Lyrica without helping her symptoms ?  ?She complains of excessive drowsiness with baclofen, is no longer taking it, ?  ?I gave her a trial of Trileptal 150 mg twice a day March 2020, she denies significant improvement. ?  ?She has missed her March 2020 Tysabri infusion ?  ?I personally reviewed MRI of the brain with and without contrast on March 15, 2018: Bilateral scattered supratentorium, periventricular and subcortical white matter hyperintensity, solitary subtle linear enhancing lesion in the right frontal just cortical region, presence of multiple T1 black holes, mild supratentorium cortical atrophy ?  ?Update April 02, 2019 SS: She is on Tysabri, doing well with that. She has pain to tops of her legs, days where her legs don't operate right, like she may be clumsy, wobbley and wobbly. Fell in November, cracked 3 ribs, has healed, was sore. Tried tizanidine, it must not have helped, because she doesn't remember it. She doesn't work, is try to get disability, using walker. She prefers not to drive, because has to closely concentrate. Lives alone, her husband passed away 2 months ago, unexpectedly, married 17 years. Overall, doing well, condition is stable, no new symptoms. ?  ?UPDATE May 31 2020: ?She is accompanied by her friends of more than 40 years and  drilled at today's visit, she lives by herself, is not married, has no children, she no longer drive, has worsening gait abnormality, spent most of the time sitting at home, watching TV, smoke ?Because of abnormal liver functional test in April 2021, his [place]ane Dee has stopped, was also referred to GI physician for evaluation, after stopping Tysabri for more than 6 months, most recent repeat liver functional test continues to show significant abnormality ?  ?Ultrasound of abdomen showed no  significant abnormality, hepatitis panel was negative, denies history of alcohol abuse, ?Hepatitis B surface antibody was negative, recently received hepatitis B vaccination, ?  ?More GI evaluation is pendi

## 2021-04-26 NOTE — Progress Notes (Signed)
Chart reviewed, agree above plan ?

## 2021-04-27 ENCOUNTER — Telehealth: Payer: Self-pay | Admitting: Neurology

## 2021-04-27 LAB — COMPREHENSIVE METABOLIC PANEL
ALT: 51 IU/L — ABNORMAL HIGH (ref 0–32)
AST: 54 IU/L — ABNORMAL HIGH (ref 0–40)
Albumin/Globulin Ratio: 1.8 (ref 1.2–2.2)
Albumin: 4.3 g/dL (ref 3.8–4.9)
Alkaline Phosphatase: 385 IU/L — ABNORMAL HIGH (ref 44–121)
BUN/Creatinine Ratio: 14 (ref 9–23)
BUN: 12 mg/dL (ref 6–24)
Bilirubin Total: 0.2 mg/dL (ref 0.0–1.2)
CO2: 30 mmol/L — ABNORMAL HIGH (ref 20–29)
Calcium: 9.4 mg/dL (ref 8.7–10.2)
Chloride: 99 mmol/L (ref 96–106)
Creatinine, Ser: 0.85 mg/dL (ref 0.57–1.00)
Globulin, Total: 2.4 g/dL (ref 1.5–4.5)
Glucose: 101 mg/dL — ABNORMAL HIGH (ref 70–99)
Potassium: 3.9 mmol/L (ref 3.5–5.2)
Sodium: 142 mmol/L (ref 134–144)
Total Protein: 6.7 g/dL (ref 6.0–8.5)
eGFR: 80 mL/min/{1.73_m2} (ref >59)

## 2021-04-27 NOTE — Telephone Encounter (Signed)
I called the patient, relayed comments about stable LFTs, she is agreeable for MRCP, I will let GI know.  She still does not have transportation to [facility] for hepatology referral.  I will update her once MRI of the brain returns. ?

## 2021-04-27 NOTE — Telephone Encounter (Signed)
Hello.  Her LFTs are stable and in fact AST/ALT improved from previous, this is a chronic issue.  I had previously ordered an MRCP for her but she never followed through with having it done.  Can you see if she is willing to get the MRCP done?  That is the next step in her care.  She has had an extensive evaluation of the liver biopsy already and I have recommended hepatology referral.  Her friend said she would take her to [facility], but sounds like she has not followed through with that yet, I strongly recommend she follow through with Hepatology.  Other than the MRCP we have done everything for her workup and needs subspecialty care with hepatology. Thanks ?

## 2021-04-27 NOTE — Telephone Encounter (Signed)
Please call the patient, CMP from yesterday shows continued elevated alkaline phosphatase 385, AST 54, ALT 51.  We can proceed with MRI of the brain with and without contrast to ensure MS stability.  I will copy her gastroenterologist on these results, reviewing his last notes it seems it was recommended that she visit with hepatology, but she has transportation issues. ?

## 2021-04-27 NOTE — Telephone Encounter (Signed)
[employer] pending faxed notes 

## 2021-04-28 NOTE — Telephone Encounter (Signed)
Secure staff message sent to radiology scheduling to contact pt to set up her appt.  ? ?Lm on vm for patient to return call. ?

## 2021-05-01 NOTE — Telephone Encounter (Signed)
[employer] auth: [id number] (exp. 04/27/21 to 07/27/21) order sent to [facility]. They will reach out to the patient to schedule.  ?

## 2021-05-01 NOTE — Telephone Encounter (Signed)
Lm on vm for patient to return call 

## 2021-05-02 ENCOUNTER — Telehealth: Payer: Self-pay | Admitting: Neurology

## 2021-05-02 MED ORDER — ALPRAZOLAM 1 MG PO TABS: ORAL_TABLET | ORAL | 0 refills | Status: DC

## 2021-05-02 NOTE — Addendum Note (Signed)
Addended by: Christophe Louis E on: 05/02/2021 01:28 PM ? ? Modules accepted: Orders ? ?

## 2021-05-02 NOTE — Telephone Encounter (Signed)
Pt is asking that Xanax be called into  [id number] for her upcoming MRI ?

## 2021-05-02 NOTE — Telephone Encounter (Signed)
Pt is scheduled for MRCP on Friday, 05/19/21 at 2 pm. ?

## 2021-05-02 NOTE — Telephone Encounter (Signed)
Orders pended for approval

## 2021-05-02 NOTE — Addendum Note (Signed)
Addended by: Suzzanne Cloud on: 05/02/2021 02:50 PM ? ? Modules accepted: Orders ? ?

## 2021-05-19 ENCOUNTER — Other Ambulatory Visit: Payer: Self-pay | Admitting: Gastroenterology

## 2021-05-19 ENCOUNTER — Ambulatory Visit (HOSPITAL_COMMUNITY)
Admission: RE | Admit: 2021-05-19 | Discharge: 2021-05-19 | Disposition: A | Discharge status: Home / Self Care | Payer: 59 | Source: Ambulatory Visit | Attending: Neurology | Admitting: Neurology

## 2021-05-19 ENCOUNTER — Ambulatory Visit (HOSPITAL_COMMUNITY)
Admission: RE | Admit: 2021-05-19 | Discharge: 2021-05-19 | Disposition: A | Discharge status: Home / Self Care | Payer: 59 | Source: Ambulatory Visit | Attending: Gastroenterology | Admitting: Gastroenterology

## 2021-05-19 DIAGNOSIS — G35 Multiple sclerosis: Secondary | ICD-10-CM | POA: Insufficient documentation

## 2021-05-19 DIAGNOSIS — R748 Abnormal levels of other serum enzymes: Secondary | ICD-10-CM

## 2021-05-19 DIAGNOSIS — Z1211 Encounter for screening for malignant neoplasm of colon: Secondary | ICD-10-CM | POA: Insufficient documentation

## 2021-05-19 DIAGNOSIS — Z9889 Other specified postprocedural states: Secondary | ICD-10-CM | POA: Insufficient documentation

## 2021-05-19 IMAGING — MR MR ABDOMEN WO/W CM MRCP
12 of 21 series · 22 of 48 positions shown · IV contrast (gadavist)
Comparison: Right upper quadrant ultrasound [DATE] and CT
[DATE]

CLINICAL DATA: Elevated LFTs concern for PSC.

EXAM:
MRI ABDOMEN WITHOUT AND WITH CONTRAST (INCLUDING MRCP)
TECHNIQUE: Multiplanar multisequence MR imaging of the abdomen was performed
both before and after the administration of intravenous contrast.
Heavily T2-weighted images of the biliary and pancreatic ducts were
obtained, and three-dimensional MRCP images were rendered by post
processing.
CONTRAST:  4mL GADAVIST GADOBUTROL 1 MMOL/ML IV SOLN

[Series 12: cor ssfse nav · coronal · 6.0mm · 0.78mm/px · 1 of 30 slices shown]
[im 1/30]
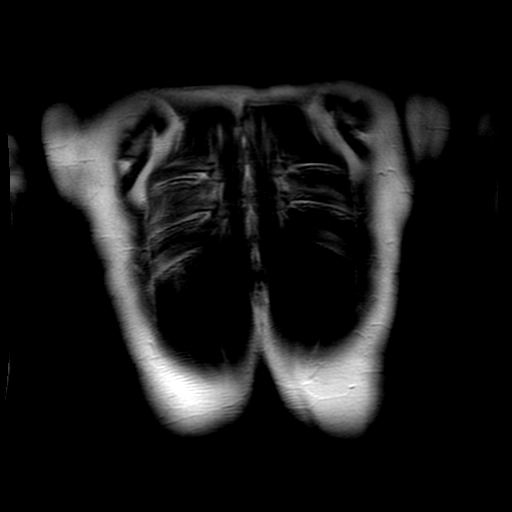

[Series 13: ax ssfse nav · axial · 6.0mm · 0.70mm/px · 1 of 34 slices shown]
[im 1/34]
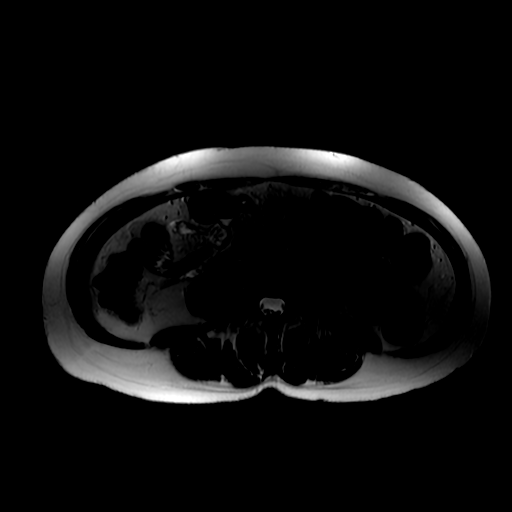

[Series 14: T2 fat-sat · axial · 6.0mm · 0.70mm/px · 1 of 34 slices shown]
[im 1/34]
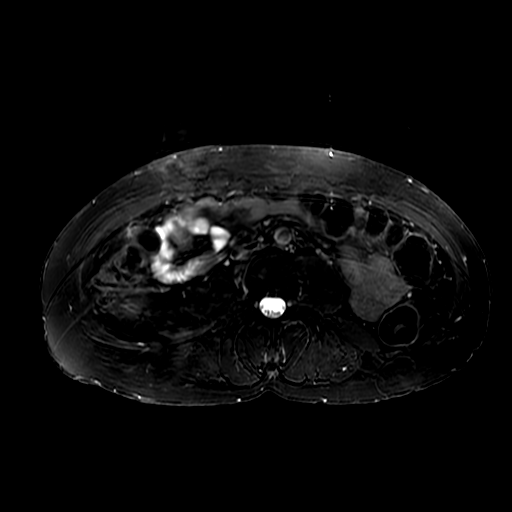

[Series 16: DWI b500 · axial · 8.0mm · 1.48mm/px · 1 of 64 slices shown]
[im 1/64]
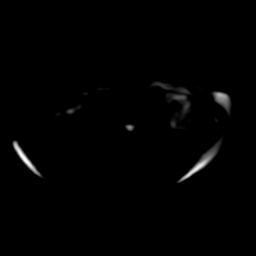

[Series 17: T1 dynamic · axial · 5.0mm · 0.82mm/px · z∈[-510,-253]mm · 3 of 104 slices shown (1 of 4)]
[im 1/104]
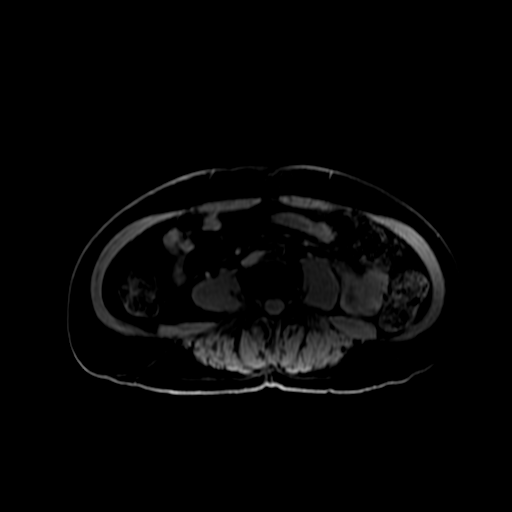
[im 52/104]
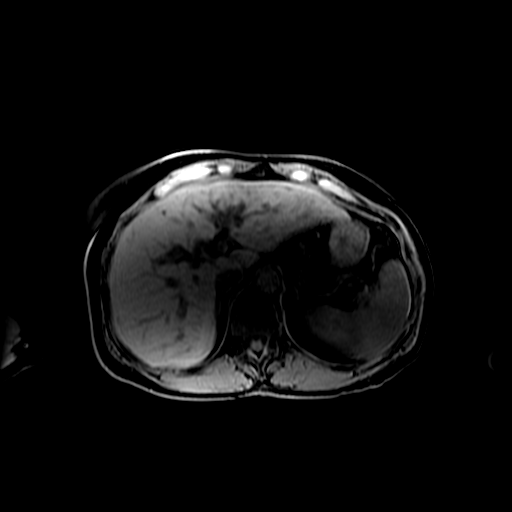
[im 104/104]
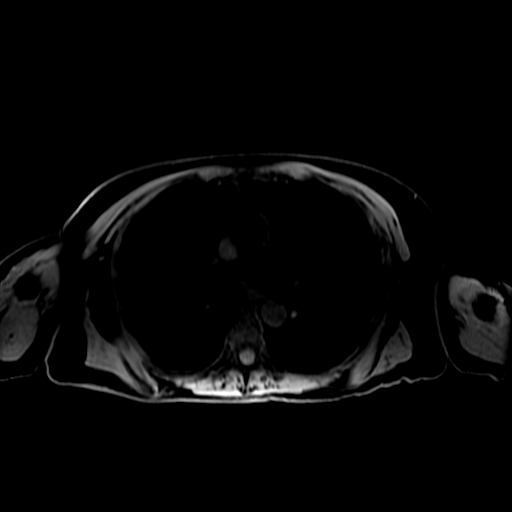

[Series 18: radial 2d thick · coronal · 40.0mm · 0.86mm/px · 1 of 4 slices shown]
[im 1/4]
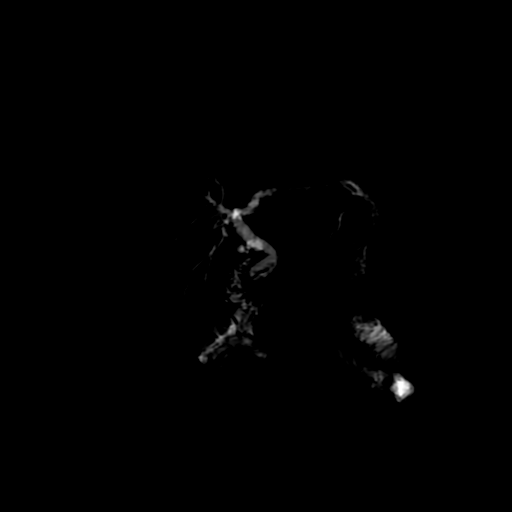

[Series 19: bSSFP · coronal · 6.0mm · 0.78mm/px · 1 of 30 slices shown]
[im 1/30]
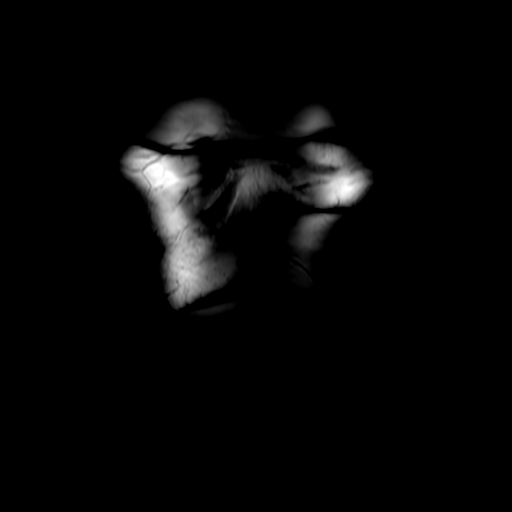

[Series 21: T1 dynamic · coronal · 3.4mm · 1.56mm/px · 3 of 108 slices shown (2 of 4)]
[im 1/108]
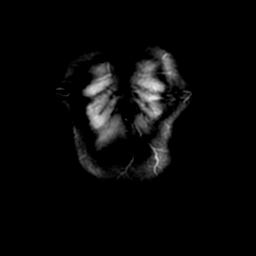
[im 54/108]
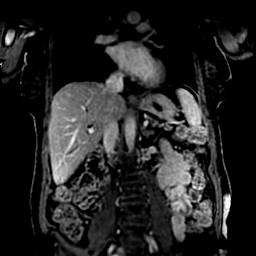
[im 108/108]
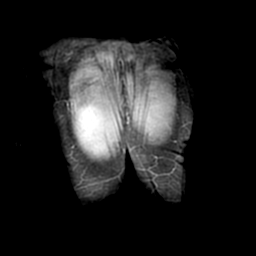

[Series 1650: ADC · axial · 8.0mm · 1.48mm/px · 1 of 32 slices shown]
[im 1/32]
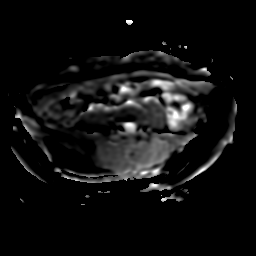

[Series 1701: T1 dynamic · axial · 5.0mm · 0.82mm/px · z∈[-510,-253]mm · 3 of 104 slices shown (3 of 4)]
[im 1/104]
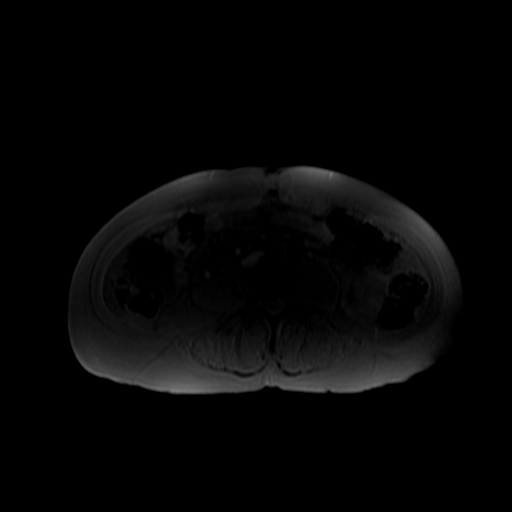
[im 52/104]
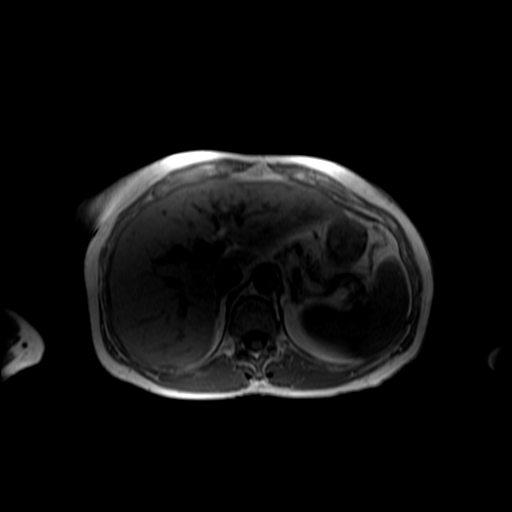
[im 104/104]
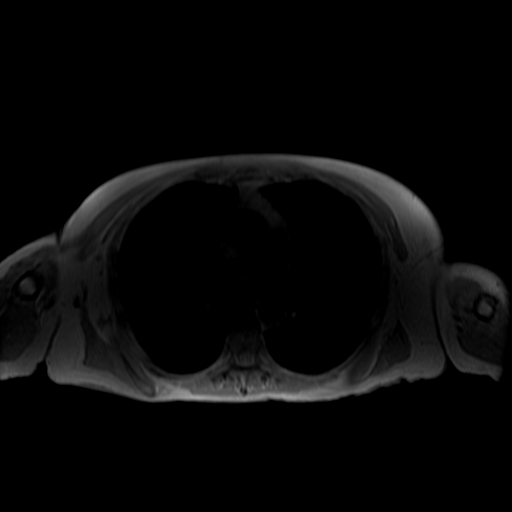

[Series 1702: T1 dynamic · axial · 5.0mm · 0.82mm/px · z∈[-510,-253]mm · 3 of 104 slices shown (4 of 4)]
[im 1/104]
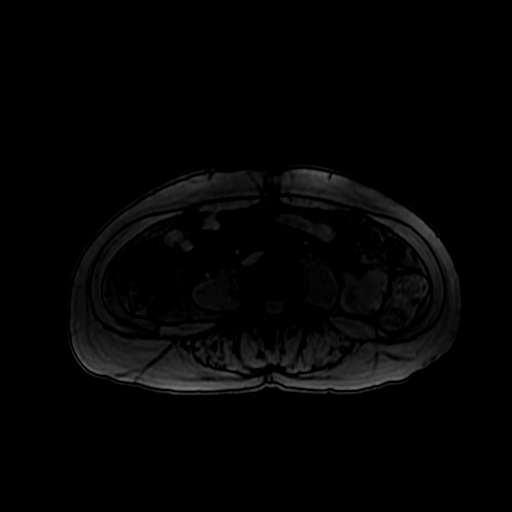
[im 52/104]
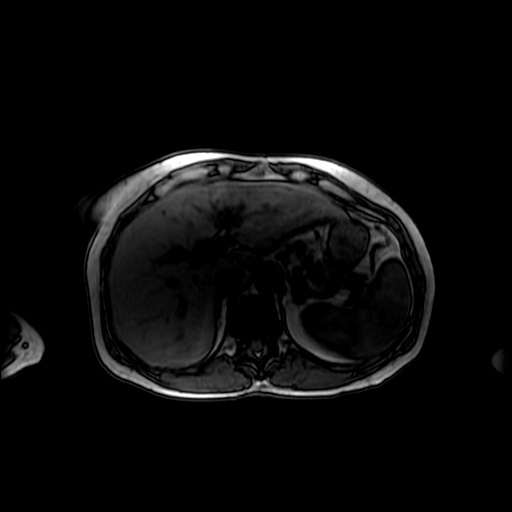
[im 104/104]
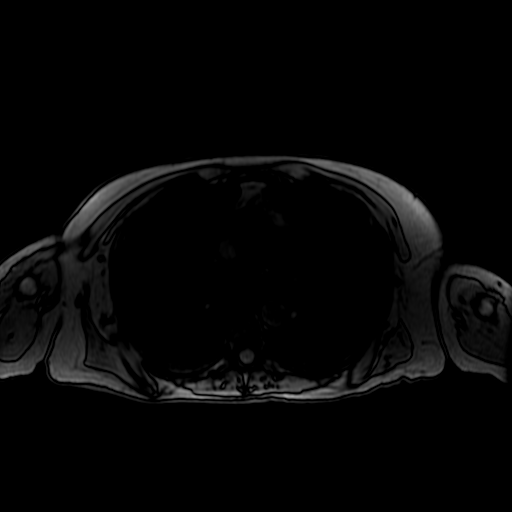

[Series 2000: T1 dynamic post-contrast · axial · non-contrast · 4.0mm · 0.86mm/px · z∈[-502,-272]mm · 3 of 116 slices shown]
[im 1/116]
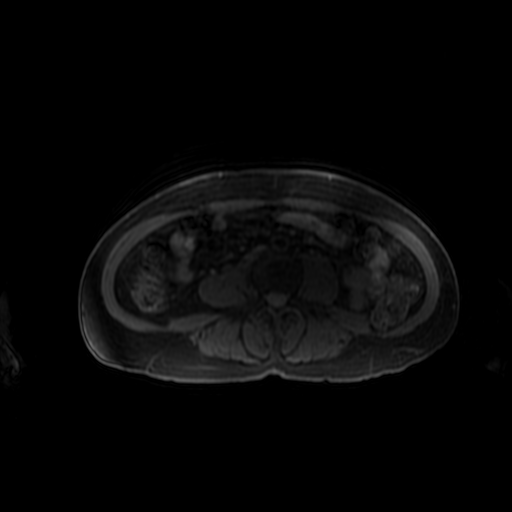
[im 58/116]
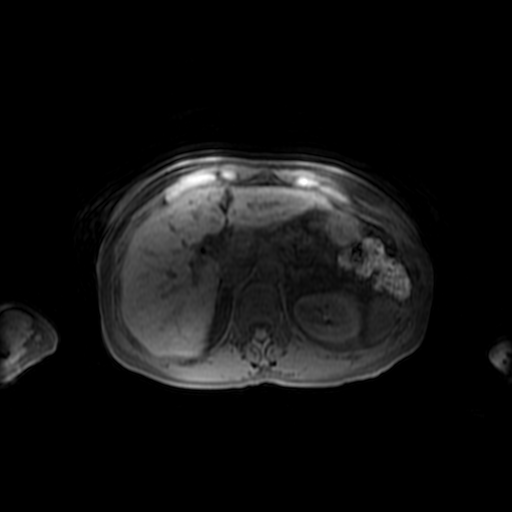
[im 116/116]
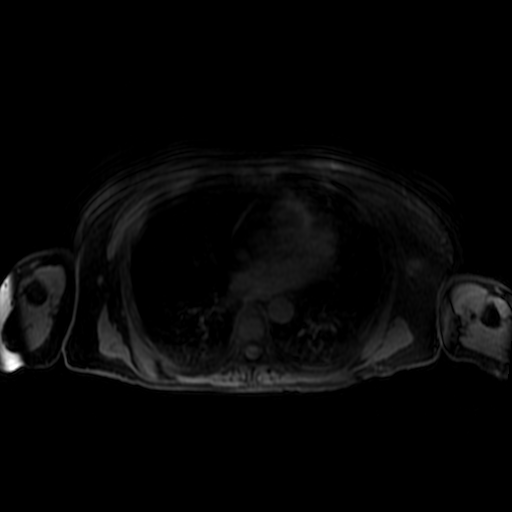

[22 of 48 positions shown; findings below may reference images not displayed]

FINDINGS: Lower chest: No acute abnormality.

Hepatobiliary: No hepatic steatosis. No suspicious hepatic lesion.
Gallbladder surgically absent. Prominence of the extra and central
intrahepatic biliary tree with the common duct measuring 8 mm. There
is gentle tapering of the common duct to the level of the ampulla.
No peribiliary enhancement or dominant stricture. Dilation of the
duct is similar to prior ultrasound [DATE] and favored
reservoir effect post cholecystectomy.

Pancreas: No pancreatic ductal dilation or evidence of acute
inflammation. No cystic or solid hyperenhancing pancreatic lesion
identified.

Spleen:  Within normal limits in size and appearance.

Adrenals/Urinary Tract: No masses identified. No evidence of
hydronephrosis.

Stomach/Bowel: Visualized portions within the abdomen are
unremarkable.

Vascular/Lymphatic: No pathologically enlarged lymph nodes
identified. No abdominal aortic aneurysm demonstrated.

Other:  None.

Musculoskeletal: No suspicious bone lesions identified.
IMPRESSION: Prominence of the extra and central intrahepatic biliary tree with
the common duct measuring 8 mm but with gentle tapering to the level
of the ampulla, dilation of the duct is similar to prior ultrasound
[DATE] and favored reservoir effect post cholecystectomy.

No peribiliary enhancement or dominant stricture.

No hepatic imaging findings to suggest cirrhosis. No suspicious
hepatic lesion.

## 2021-05-19 IMAGING — MR MR HEAD WO/W CM
9 of 14 series · 22 of 48 positions shown · IV contrast (gadavist)
Comparison: MRI [DATE] (without report).

CLINICAL DATA: Multiple sclerosis (MS)

EXAM:
MRI HEAD WITHOUT AND WITH CONTRAST
TECHNIQUE: Multiplanar, multiecho pulse sequences of the brain and surrounding
structures were obtained without and with intravenous contrast.
CONTRAST:  4mL GADAVIST GADOBUTROL 1 MMOL/ML IV SOLN

[Series 2: DWI · axial · 3.6mm · 0.94mm/px · z∈[-64,+73]mm · 2 of 78 slices shown (1 of 2)]
[im 1/78]
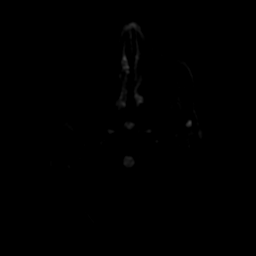
[im 78/78]
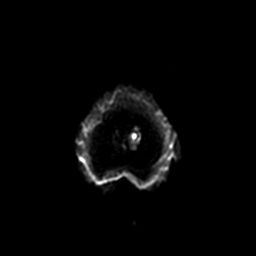

[Series 3: FLAIR · sagittal · 5.0mm · 0.47mm/px · 1 of 23 slices shown (1 of 3)]
[im 1/23]
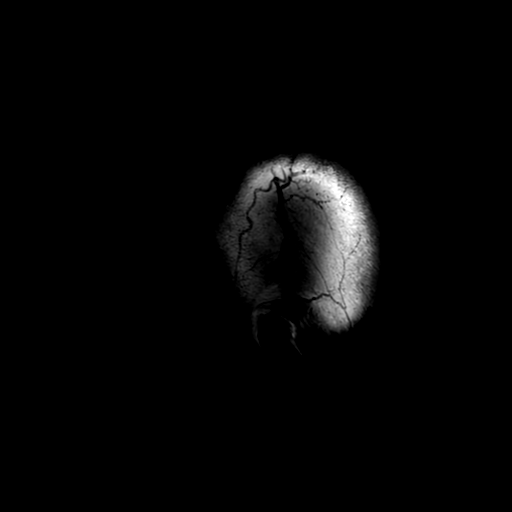

[Series 4: T2 · axial · 5.0mm · 0.23mm/px · 1 of 24 slices shown]
[im 1/24]
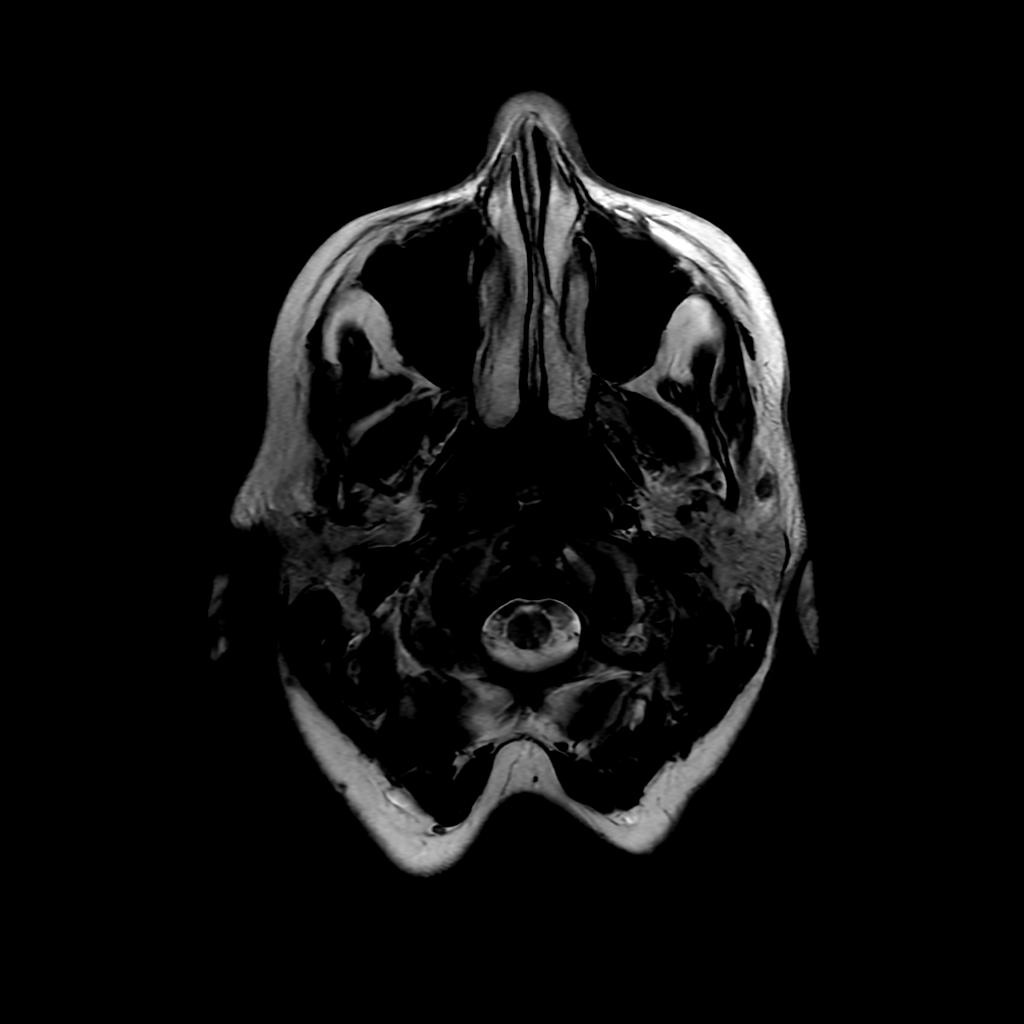

[Series 5: FLAIR · axial · 4.0mm · 0.45mm/px · 1 of 33 slices shown (2 of 3)]
[im 1/33]
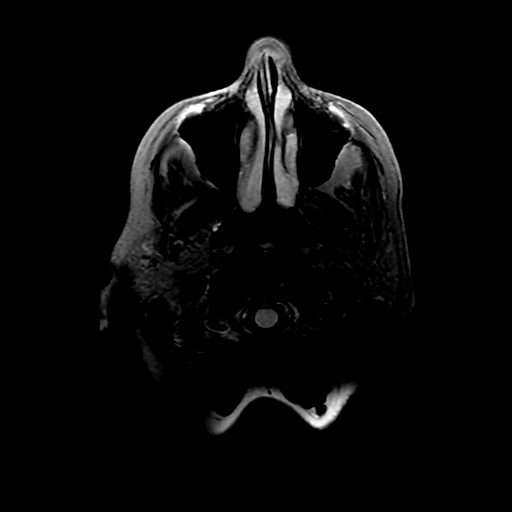

[Series 6: FLAIR · sagittal · 1.6mm · 0.49mm/px · 8 of 204 slices shown (3 of 3)]
[im 1/204]
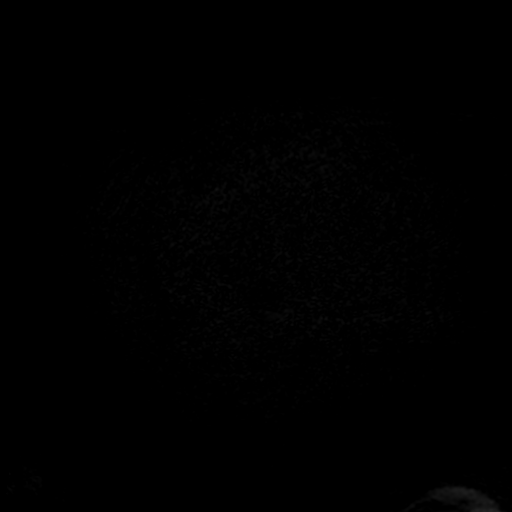
[im 30/204]
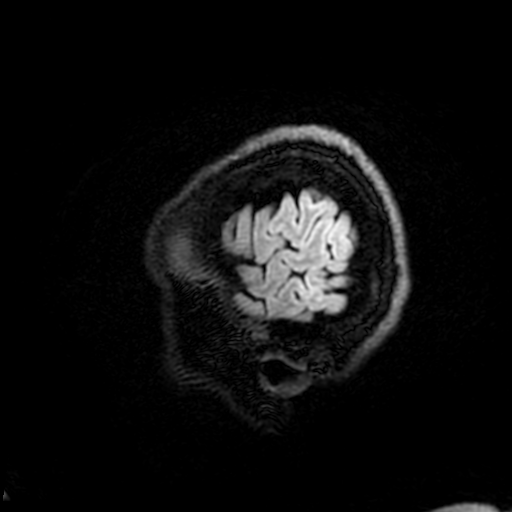
[im 59/204]
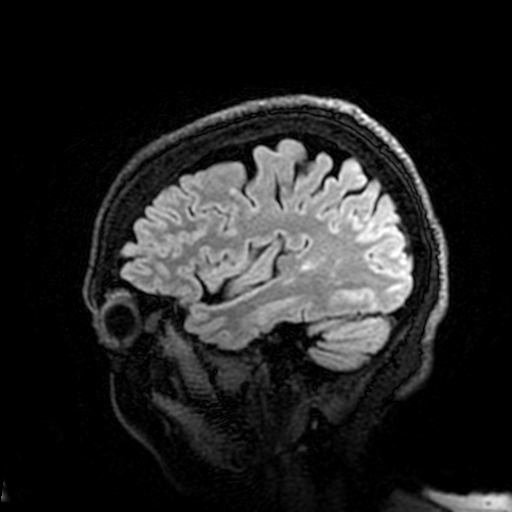
[im 88/204]
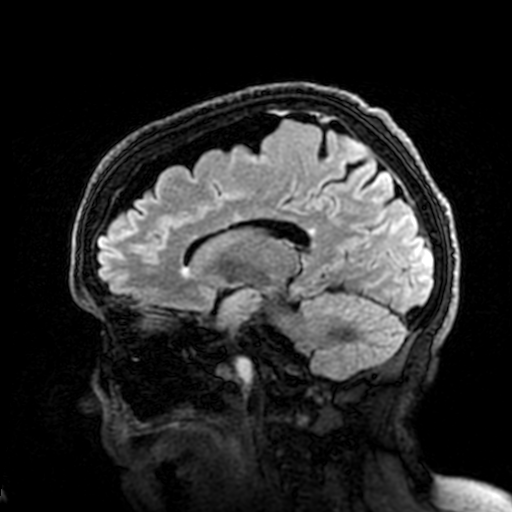
[im 117/204]
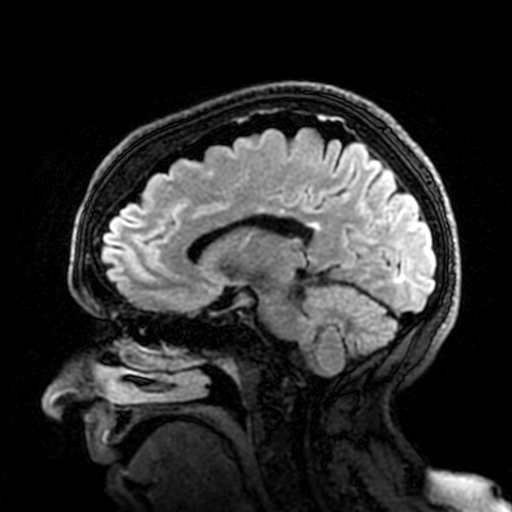
[im 146/204]
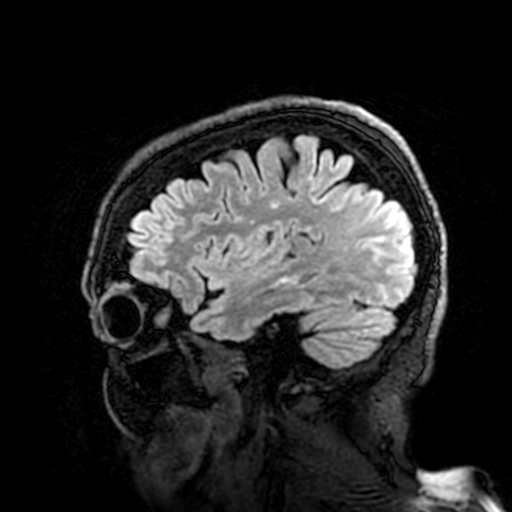
[im 175/204]
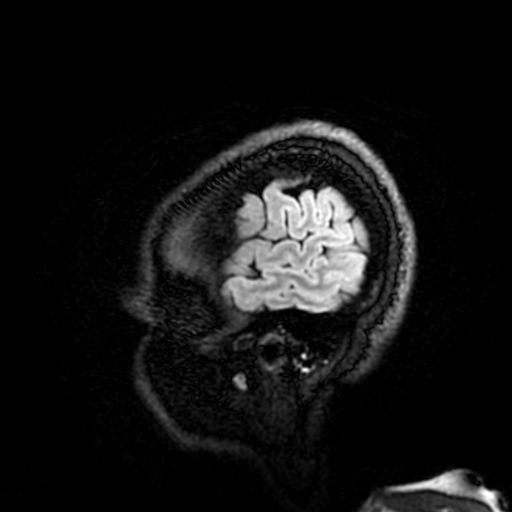
[im 204/204]
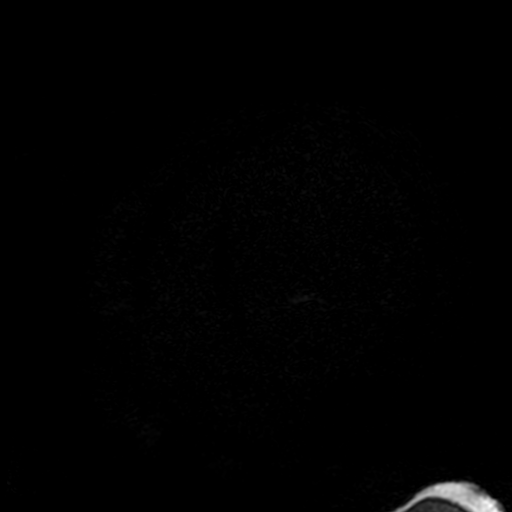

[Series 7: DWI · coronal · 5.0mm · 0.94mm/px · 3 of 70 slices shown (2 of 2)]
[im 1/70]
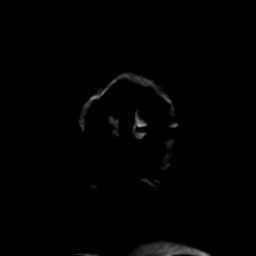
[im 35/70]
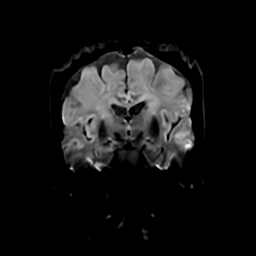
[im 70/70]
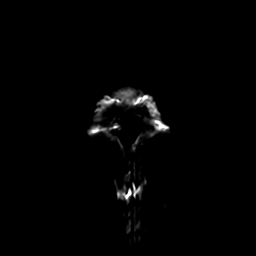

[Series 8: SWI · axial · 3.0mm · 0.47mm/px · z∈[-71,+19]mm · 3 of 92 slices shown]
[im 1/92]
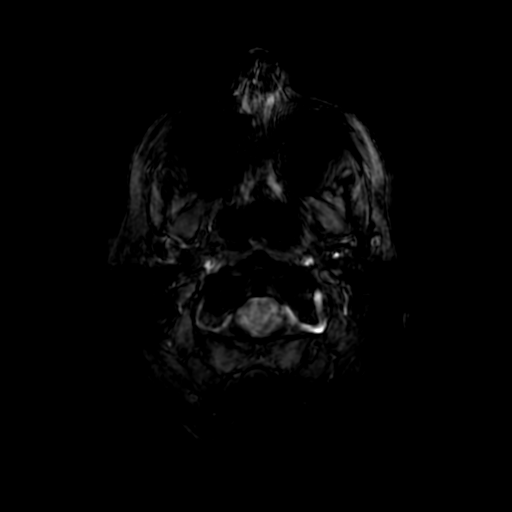
[im 31/92]
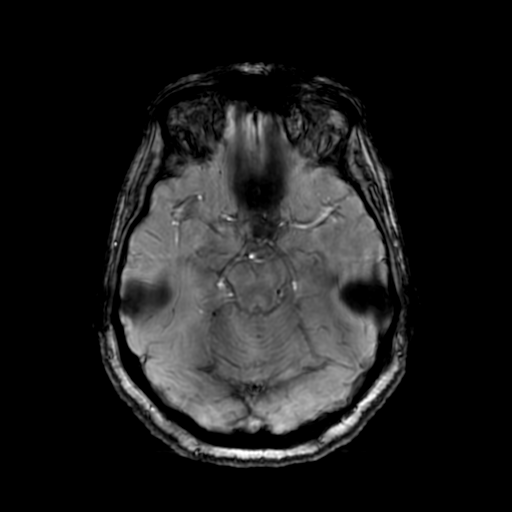
[im 61/92]
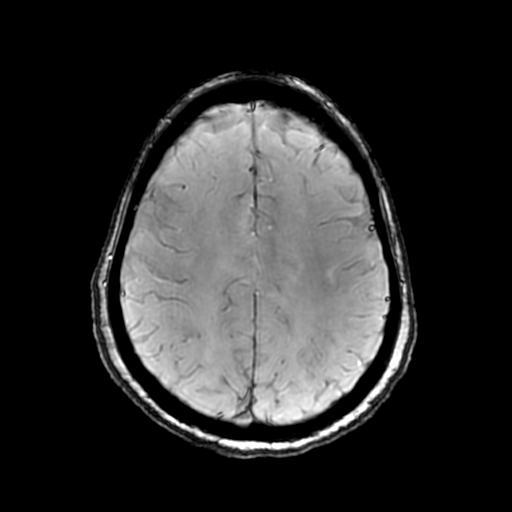

[Series 250: ADC · axial · 3.6mm · 0.94mm/px · z∈[-64,+73]mm · 2 of 39 slices shown (1 of 2)]
[im 1/39]
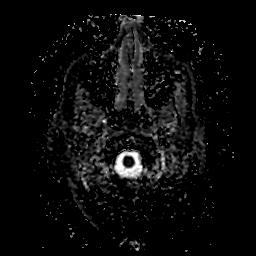
[im 39/39]
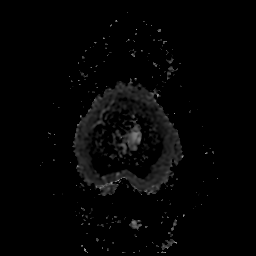

[Series 750: ADC · coronal · 5.0mm · 0.94mm/px · 1 of 35 slices shown (2 of 2)]
[im 1/35]
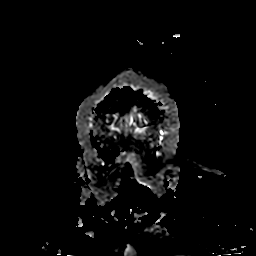

[22 of 48 positions shown; findings below may reference images not displayed]

FINDINGS: Brain: No acute infarction, hemorrhage, hydrocephalus, extra-axial
collection or mass lesion. Similar scattered periventricular and
subcortical T2/FLAIR hyperintensities within the white matter. This
includes many lesions oriented perpendicular to the long axis of the
lateral ventricles at the callososeptal interface, compatible with
chronic demyelination. Some of these lesions are slightly more
conspicuous, but this is favored to relate to improved technique. No
clearly new lesions identified. No enhancing lesions or pathologic
enhancement.

Vascular: Major arterial flow voids are maintained at the skull
base.

Skull and upper cervical spine: Normal marrow signal.

Sinuses/Orbits: Clear visualized sinuses. No acute orbital findings.

Other: Small left mastoid effusion.
IMPRESSION: Similar white matter lesions in this patient with reported history
of multiple sclerosis. No new or enhancing lesions identified.

## 2021-05-19 MED ORDER — GADOBUTROL 1 MMOL/ML IV SOLN
4.0000 mL | Freq: Once | INTRAVENOUS | Status: AC | PRN
  Administered 2021-05-19: 4 mL via INTRAVENOUS

## 2021-05-22 ENCOUNTER — Telehealth: Payer: Self-pay | Admitting: Neurology

## 2021-05-22 NOTE — Telephone Encounter (Signed)
I called the patient. MRI of the brain, looks stable, no new lesions. Will continue to hold MS medication until etiology of liver elevation is determined, discussed with Dr. Terrace Arabia. She is appealing with her insurance to be approved to see hepatology in [place]. She will reach out if any change in her MS.  MRI brain with and without contrast IMPRESSION: Similar white matter lesions in this patient with reported history of multiple sclerosis. No new or enhancing lesions identified.

## 2021-05-31 ENCOUNTER — Telehealth: Payer: Self-pay

## 2021-05-31 NOTE — Telephone Encounter (Signed)
Lm on vm for patient to return call to see if she was able to speak with her insurance regarding denial of Hepatology referral.

## 2021-05-31 NOTE — Telephone Encounter (Signed)
Pt returned call. She states that she has reached out to her insurance and they told her that she needed to complete an appeal. Pt states that she has already started the Appeals process. I advised pt to keep [place] updated on her insurance's decision. Pt verbalized understanding.

## 2021-05-31 NOTE — Telephone Encounter (Signed)
-----   Message from Missy Sabins, RN sent at 05/23/2021  8:00 AM EDT ----- Regarding: FW: Hepatology referral  ----- Message ----- From: Missy Sabins, RN Sent: 05/23/2021  12:00 AM EDT To: Missy Sabins, RN Subject: Hepatology referral                            Follow up with patient to see if she was able to appeal DENIAL for Hepatology locally.

## 2021-06-19 ENCOUNTER — Encounter: Payer: Self-pay | Admitting: Neurology

## 2021-06-26 ENCOUNTER — Telehealth: Payer: Self-pay

## 2021-06-29 ENCOUNTER — Encounter: Payer: Self-pay | Admitting: Neurology

## 2021-06-29 ENCOUNTER — Ambulatory Visit: Payer: 59 | Admitting: Neurology

## 2021-06-29 VITALS — BP 138/82 | HR 51 | Ht 62.0 in | Wt 107.5 lb

## 2021-06-29 DIAGNOSIS — R269 Unspecified abnormalities of gait and mobility: Secondary | ICD-10-CM | POA: Diagnosis not present

## 2021-06-29 DIAGNOSIS — M79605 Pain in left leg: Secondary | ICD-10-CM | POA: Diagnosis not present

## 2021-06-29 DIAGNOSIS — G35 Multiple sclerosis: Secondary | ICD-10-CM | POA: Diagnosis not present

## 2021-06-29 DIAGNOSIS — M79604 Pain in right leg: Secondary | ICD-10-CM | POA: Insufficient documentation

## 2021-06-29 MED ORDER — ALPRAZOLAM 1 MG PO TABS: 1.0000 mg | ORAL_TABLET | Freq: Every evening | ORAL | 0 refills | Status: DC | PRN

## 2021-06-29 NOTE — Progress Notes (Signed)
Assessment and plan: Relapsing remitting multiple sclerosis Gait abnormality  Presented with gait abnormality since 2018, diagnosis was confirmed by abnormal MRI of the brain, also with involvement of cervical spine, and thoracic spinal cord MRI, spinal fluid testing showed more than 5 oligoclonal bands She was started on Tysabri since April 2019, until October 2021 when she was found to have significantly abnormal liver functional test, elevated alkaline phosphate,  Abnormal liver functional test She is off long-term immunomodulation therapy since then, over the past couple years, was seen by hepatologist, continue have elevation of alkaline phosphate, abnormal liver functional test, biopsy showed chronic hepatitis with portal inflammation with ductal reaction, but no etiology confirmed, she is referred to [facility] hepatologist for evaluation evaluation   New onset of left anterior thigh pain, gait abnormality, Previously we tried different medication to manage her long-term complaints of low back pain lower extremity pain without benefit,  I offered few options, she also stated is not effective, will refer her to pain management, Home physical therapy Repeat MRI of cervical thoracic spine, she did have a history of spinal cord X-ray of bilateral hip  Will return to clinic with nurse practitioner in 6 months, if her liver functional test remains stable, may consider restart long-term immunomodulation therapy, options are Gilenya or its like, ocrelizumab  DIAGNOSTIC DATA (LABS, IMAGING, TESTING) - I reviewed patient records, labs, notes, testing and imaging myself where available.    ASSESSMENT AND PLAN 56 y.o. year old female   1.  Relapsing remitting multiple sclerosis 2.  Gait abnormality -Off Tysabri since October 2021 due to elevated alkaline phosphatase, has been evaluated by GI, etiology undetermined, has recommended evaluation by hepatology, but she has transportation issues to  get a follow-up -Today, check MRI of the brain with and without contrast to check for MS activity, will need to send in Xanax before MRI -Check CMP to evaluate liver enzymes, kidneys for contrast -Depending on MRI of the brain make a decision about DMT for MS, difficult given her current liver issues, however last blood work in September 2022 showed normal AST, ALT, alkaline phosphatase was 302 (high as 754) -May add on baclofen or tizanidine for spasticity, achy pain, but will see what CMP shows first, has been on in the past -Return back in 4 months with Dr. Terrace Arabia  Medical History :    [name] 56 year old female, relapsing remitting multiple sclerosis,   Past medical history of hysterectomy in 2003  cholecystectomy in 1999,  long time smoker, pack a day   She was highly functional until June 2018, without clear triggers, she began to notice bilateral anterior thigh muscle achy pain, spasm, also noticed gradual onset gait abnormality, which has progressively worse over the past few months, recently she also noticed bilateral hands numbness tingling, clumsy of bilateral hand,   Initially she has mild low back pain, but no longer has significant low back pain, she denies neck pain, denies bowel bladder incontinence, she denies dysarthria, no diplopia no dysphagia.   Laboratory evaluation in February 2019, CBC showed elevated WBC of 15.3, hemoglobin of 16.6, liver functional test, BMP, negative troponin, CPK was 25, CMP showed elevated alkaline phosphate 156, UA showed evidence of bacteria, normal TSH, negative HIV,    MRI of cervical spine with without contrast at [facility] in March 2019 showed evidence of signal abnormality behind C3 and 4, with mild enhancement,   MRI of brain showed evidence of multiple area of abnormal T2/FLAIR hyperintensity  signal in the white matter at both hemisphere predominantly periventricular, there was also T1 black holes, involving the lower pons,  medulla, deep left cerebellar white matter, faint enhancement of at least one lesion in the left frontal lobe, unusual bulbous appearing of the anterior, inferior third ventricle in the region of the infundibular recess, with partial ringlike enhancement, hyper intense signal in the hypothalamus, right greater than left, possible he also optic asthma, right greater than left,   MRI of thoracic spine showed abnormal signal behind C7 T1-T2, also behind T6, T7, T8, some enhancement of the upper cord abnormality.   CSF showed more than 5 oligoclonal banding, with normal total protein 31  Imaging findings along with her history spinal fluid testing confirmed the diagnosis of relapsing remitting multiple sclerosis,    Laboratory evaluations in March 2019, vitamin D level was decreased 24, normal or negative NMO antibody were negative,Lyme titer, ANA, copper, ferritin level was mildly elevated 183, CPK, TSH, C-reactive protein, folic acid, A1c was 5.5, HIV, B12, RPR,    She started [place] since April 2019,  , she tolerated very well, there was no significant side effect noticed, she continue have fatigue, taking Provigil 100 mg daily for her fatigue   But laboratory evaluation in February 2020 began to show significant abnormal liver functional test alkaline phosphate 523, AST 387, ALT 297, [name] has been on hold since then,   Over the years, she has been complains of low back pain, lower extremity deep achy pain,  we have tried many different medications, either ineffective or she cannot tolerate it, Cymbalta, gabapentin, nortriptyline, NSAIDs Lyrica, Trileptal   She was followed by our clinic intermittently, had extensive GI evaluation by Dr. 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, ferritin has been between 200-500 range, liver biopsy June 2022 showed chronic hepatitis with portal inflammation with ductular reaction, moderate fibrosis, differentiation is broad but clear-cut etiology was not found, staining was not consistent with typical hemochromatosis, was also seen by hematologist for evaluation, do not think that hemochromatosis is causing of her liver disease,  Magnetic resonance cholangio pancreatography, on May 19, 2021 showed prominence of the extra and center intrahepatic biliary tree with the common duct measuring 8 mm but with gentle tapering to the level of the ampulla, dilation of the duct is similar to prior ultrasound, failure reservoir effect post cholecystectomy, no hepatic imaging finding to suggest cyanosis, no suspicious hepatic lesion,  CMP in April 2023: Continued elevation of alkaline phosphate, AST 54, ALT 51, actually this is better than her baseline level   Reviewed most recent MRI of the brain with without contrast on May 19, 2021, white matter multiple sclerosis lesions, no contrast-enhancement,  Today she complains of fairly acute onset significant tingling, radiating to left left anterior lateral thigh, increased gait abnormality due to denies bowel and bladder incontinence, or upper extremity involvement      PHYSICAL EXAM  Vitals:   06/29/21 1329  BP: 138/82  Pulse: (!) 51  Weight: 107 lb 8 oz (48.8 kg)  Height: 5\' 2"  (1.575 m)   Body mass index is 19.66 kg/m.  PHYSICAL EXAMNIATION:  Gen: NAD, conversant, well nourised, well groomed  Cardiovascular: Regular rate rhythm, no peripheral edema, warm, nontender. Eyes: Conjunctivae clear without exudates or hemorrhage Neck: Supple, no carotid bruits. Pulmonary: Clear to auscultation bilaterally   NEUROLOGICAL EXAM:  MENTAL  STATUS: Speech/cognition: Depressed looking middle-age female, awake, alert oriented to history taking and casual conversation  CRANIAL NERVES: CN II: Visual fields are full to confrontation.  Pupils are round equal and briskly reactive to light. CN III, IV, VI: extraocular movement are normal. No ptosis. CN V: Facial sensation is intact to pinprick in all 3 divisions bilaterally. Corneal responses are intact.  CN VII: Face is symmetric with normal eye closure and smile. CN VIII: Hearing is normal to casual conversation CN IX, X: Palate elevates symmetrically. Phonation is normal. CN XI: Head turning and shoulder shrug are intact CN XII: Tongue is midline with normal movements and no atrophy.  MOTOR: Upper extremity motor strength is normal, with action tremor, variable effort on lower extremity, felt there was mild bilateral hip flexion weakness  REFLEXES: Reflexes are 2+ and symmetric at the biceps, triceps, knees, and ankles. Plantar responses are flexor.  SENSORY: Intact to light touch   COORDINATION: Rapid alternating movements and fine finger movements are intact. There is no dysmetria on finger-to-nose and heel-knee-shin.    GAIT/STANCE: Need push-up to get up from seated position, cautious, wide-based, unsteady    REVIEW OF SYSTEMS: Out of a complete 14 system review of symptoms, the patient complains only of the following symptoms, and all other reviewed systems are negative.  See HPI  ALLERGIES: Allergies  Allergen Reactions   Dilaudid [Hydromorphone Hcl] Anaphylaxis   Ibuprofen Anaphylaxis   Iodine Anaphylaxis   Penicillins Anaphylaxis    Has patient had a PCN reaction causing immediate rash, facial/tongue/throat swelling, SOB or lightheadedness with hypotension:yes Has patient had a PCN reaction causing severe rash involving mucus membranes or skin necrosis: no Has patient had a PCN reaction that required hospitalization no Has patient had a PCN reaction  occurring within the last 10 years: no If all of the above answers are "NO", then may proceed with Cephalosporin use.    Gabapentin Other (See Comments)    GI upset   Codeine Nausea Only   Tramadol Nausea And Vomiting    HOME MEDICATIONS: Outpatient Medications Prior to Visit  Medication Sig Dispense Refill   Cholecalciferol (VITAMIN D3) 25 MCG (1000 UT) CAPS Take 2,000 Units by mouth daily.     ALPRAZolam (XANAX) 1 MG tablet Take 1-2 tablets 30 minutes before MRI, may repeat before going into scanner if needed. Must have driver. 3 tablet 0   No facility-administered medications prior to visit.    PAST MEDICAL HISTORY: Past Medical History:  Diagnosis Date   Allergy    Multiple sclerosis (HCC)    Shingles    Weakness     PAST SURGICAL HISTORY: Past Surgical History:  Procedure Laterality Date   ABDOMINAL HYSTERECTOMY     CHOLECYSTECTOMY      FAMILY HISTORY: Family History  Problem Relation Age of Onset   Cancer Paternal Grandmother    Cancer Paternal Aunt    Cancer Paternal Aunt    Colon cancer Neg Hx    Stomach cancer Neg Hx    Esophageal cancer Neg Hx    Pancreatic cancer Neg Hx     SOCIAL HISTORY: Social History   Socioeconomic History   Marital status: Widowed    Spouse name: Not on file   Number of children: 0   Years of education: college  Highest education level: Not on file  Occupational History   Occupation: disable  Tobacco Use   Smoking status: Every Day    Packs/day: 0.50    Types: Cigarettes   Smokeless tobacco: Never  Vaping Use   Vaping Use: Never used  Substance and Sexual Activity   Alcohol use: No   Drug use: No   Sexual activity: Not on file  Other Topics Concern   Not on file  Social History Narrative   Lives at home with husband.   Caffeine use:  1-2 cups daily.   Right-handed.   Social Determinants of Health   Financial Resource Strain: Not on file  Food Insecurity: Not on file  Transportation Needs: Not on file   Physical Activity: Not on file  Stress: Not on file  Social Connections: Not on file  Intimate Partner Violence: Not on file

## 2021-07-05 ENCOUNTER — Telehealth: Payer: Self-pay | Admitting: Neurology

## 2021-07-05 NOTE — Telephone Encounter (Signed)
Referral for Pain Clinic sent to [facility] [phone]. 

## 2021-07-05 NOTE — Telephone Encounter (Signed)
[facility] is taking this patient. 

## 2021-07-06 NOTE — Telephone Encounter (Signed)
Lm on vm for patient to return call. I told pt that I was calling to follow up and see if she was able to speak with her insurance company about Hepatology referral. I asked that pt give me a call back to discuss.

## 2021-07-07 ENCOUNTER — Telehealth: Payer: Self-pay | Admitting: Neurology

## 2021-07-07 NOTE — Telephone Encounter (Signed)
Cecillia from [facility] is calling to request Physical Therapy orders for Pt. She would like return call for a verbal order at [phone]

## 2021-07-10 ENCOUNTER — Telehealth: Payer: Self-pay | Admitting: Neurology

## 2021-07-10 ENCOUNTER — Other Ambulatory Visit: Payer: Self-pay | Admitting: Neurology

## 2021-07-10 NOTE — Telephone Encounter (Signed)
[employer] auth: [id number] exp. 07/07/21-10/07/21 sent to [facility].

## 2021-07-10 NOTE — Telephone Encounter (Signed)
Returned call to [employer]. She will add PT orders to meet patient's needs.

## 2021-07-11 MED ORDER — ALPRAZOLAM 1 MG PO TABS: 1.0000 mg | ORAL_TABLET | Freq: Every evening | ORAL | 0 refills | Status: DC | PRN

## 2021-07-12 ENCOUNTER — Telehealth: Payer: Self-pay | Admitting: Neurology

## 2021-07-12 DIAGNOSIS — R269 Unspecified abnormalities of gait and mobility: Secondary | ICD-10-CM

## 2021-07-12 DIAGNOSIS — R29898 Other symptoms and signs involving the musculoskeletal system: Secondary | ICD-10-CM

## 2021-07-12 DIAGNOSIS — G35 Multiple sclerosis: Secondary | ICD-10-CM

## 2021-07-12 NOTE — Telephone Encounter (Signed)
MyChart message sent to patient.

## 2021-07-12 NOTE — Telephone Encounter (Signed)
Received voicemail from [place] with [employer] that she received an order for this patient. Stated that [facility] [employer] is Out of Network with their company and patient would be paying out of pocket for any expenses. She would like to know how to proceed with this. I didn't see any documentation of this referral going out so I'm unsure what it is for. Her call back number is [phone]

## 2021-07-12 NOTE — Telephone Encounter (Signed)
I spoke to [place] w/ [employer]. They received an order from [facility] for a bedside commode and walker. The company is out-of-network with the patient's plan.   I called the patient. She has PT this week and will discuss the equipment order with her therapist.

## 2021-07-13 NOTE — Telephone Encounter (Signed)
Patient reviewed and responded to MyChart message. Last read by [name] at 11:35 AM on 07/13/2021. She has no updated information from her insurance company at this time. Pt advised to keep [place] updated.

## 2021-07-19 ENCOUNTER — Other Ambulatory Visit: Payer: Self-pay | Admitting: Neurology

## 2021-07-20 ENCOUNTER — Telehealth: Payer: Self-pay | Admitting: *Deleted

## 2021-07-20 MED ORDER — ALPRAZOLAM 1 MG PO TABS
1.0000 mg | ORAL_TABLET | Freq: Every evening | ORAL | 0 refills | Status: DC | PRN
Start: 2021-07-20 — End: 2021-07-24

## 2021-07-20 NOTE — Telephone Encounter (Signed)
The patient was referred to pain management. Received a fax from [facility] that they reached out to the patient. She declined to schedule an initial evaluation.

## 2021-07-24 MED ORDER — ALPRAZOLAM 1 MG PO TABS: 1.0000 mg | ORAL_TABLET | Freq: Every evening | ORAL | 0 refills | Status: DC | PRN

## 2021-07-24 NOTE — Telephone Encounter (Signed)
[facility] [employer]) calling to check on the status of rollator.  Would like a call back.  Contact info: [phone]

## 2021-07-24 NOTE — Telephone Encounter (Signed)
Signed by MD, faxed and confirmed to [employer].

## 2021-07-24 NOTE — Telephone Encounter (Signed)
[name] called back. She needs orders for a rollator and bedside commode faxed to [employer] at [phone].

## 2021-07-24 NOTE — Telephone Encounter (Signed)
I called [name] back at [facility]. I left voicemail with the out-of-network information below. We are happy to send the DME orders to another location, if needed. Provided our call back number.

## 2021-07-24 NOTE — Addendum Note (Signed)
Addended by: Lindell Spar C on: 07/24/2021 03:04 PM   Modules accepted: Orders

## 2021-07-24 NOTE — Addendum Note (Signed)
Addended by: Melvyn Novas on: 07/24/2021 01:21 PM   Modules accepted: Orders

## 2021-07-31 ENCOUNTER — Telehealth: Payer: Self-pay | Admitting: Neurology

## 2021-07-31 NOTE — Telephone Encounter (Signed)
I returned the call from [facility]). The patient had a fall on Friday, she was uninjured. [name] reports the patient is doing very well. She has been able to exercise.  I informed her that the pt's rollator has been ordered along with a bedside commode. She verbalized appreciation for the call.

## 2021-07-31 NOTE — Telephone Encounter (Signed)
FYI: [facility]) reporting pt had a fall. Pt was standing at kitchen sink and legs buckled. Pt did not have any injuries, was able to get up on her own. Also if order has been entered for pt's rollator. Would  like call back.

## 2021-08-10 ENCOUNTER — Other Ambulatory Visit: Payer: Self-pay | Admitting: Neurology

## 2021-08-21 ENCOUNTER — Encounter: Payer: Self-pay | Admitting: Neurology

## 2021-08-24 MED ORDER — ALPRAZOLAM 1 MG PO TABS: ORAL_TABLET | ORAL | 0 refills | Status: DC

## 2021-08-26 ENCOUNTER — Ambulatory Visit (HOSPITAL_COMMUNITY)
Admission: RE | Admit: 2021-08-26 | Discharge: 2021-08-26 | Disposition: A | Discharge status: Home / Self Care | Payer: 59 | Source: Ambulatory Visit | Attending: Neurology | Admitting: Neurology

## 2021-08-26 DIAGNOSIS — M79605 Pain in left leg: Secondary | ICD-10-CM | POA: Diagnosis present

## 2021-08-26 DIAGNOSIS — G35 Multiple sclerosis: Secondary | ICD-10-CM | POA: Insufficient documentation

## 2021-08-26 DIAGNOSIS — M79604 Pain in right leg: Secondary | ICD-10-CM

## 2021-08-26 DIAGNOSIS — R269 Unspecified abnormalities of gait and mobility: Secondary | ICD-10-CM | POA: Diagnosis present

## 2021-08-26 MED ORDER — GADOBUTROL 1 MMOL/ML IV SOLN
5.0000 mL | Freq: Once | INTRAVENOUS | Status: AC | PRN
  Administered 2021-08-26: 5 mL via INTRAVENOUS

## 2021-08-28 ENCOUNTER — Telehealth: Payer: Self-pay | Admitting: Neurology

## 2021-08-28 ENCOUNTER — Ambulatory Visit: Payer: 59 | Admitting: Neurology

## 2021-08-28 ENCOUNTER — Telehealth: Payer: Self-pay

## 2021-08-28 NOTE — Telephone Encounter (Signed)
Received call report from Ruby with [facility] for Dr. Terrace Arabia to review MRI of cervical and thoracic. I will fwd message.

## 2021-08-28 NOTE — Telephone Encounter (Signed)
Please call patient, MRI of cervical and thoracic spine showed evidence of significant cervical and thoracic spinal cord involvement from multiple sclerosis, which was present since her diagnosis  She was not able to put back on long-term immunomodulation therapy because abnormal liver functional test, on repeat lab in April 2023, continue to show significant abnormality  Please confirm with her who is her current hepatologist, what is the final conclusion about her abnormal liver functional test  Follow-up is scheduled for January 24  IMPRESSION: Cervical spine:   1. Multifocal T2 hyperintense signal abnormality within the cervical spinal cord, left cerebellar white matter, pontomedullary junction and dorsal medulla, as described and compatible with the provided history of multiple sclerosis. No pathologic enhancement is demonstrated within the cervical spinal cord or visible posterior fossa to suggest active demyelination. 2. Cervical spondylosis, as outlined. No significant spinal canal stenosis. Multilevel foraminal stenosis, as detailed and greatest on the right at C4-C5 (moderate), and on the left at C6-C7 (moderate/severe).   Thoracic spine:   1. Multifocal T2 hyperintense signal abnormality within the spinal cord at the thoracic levels, as described and compatible with the provided history of multiple sclerosis. Pathologic enhancement associated with lesions at the T6-T7 level, T5 level and possibly the T1-T2 level compatible with sites of active demyelination. 2. Thoracic spondylosis without significant spinal canal or foraminal stenosis. 3. Exaggerated thoracic kyphosis.

## 2021-08-29 NOTE — Telephone Encounter (Signed)
Attempted to call pt, LVM for results per DPR. °Ask pt to call back for questions or concerns.  °

## 2021-09-01 ENCOUNTER — Telehealth: Payer: Self-pay | Admitting: *Deleted

## 2021-09-01 NOTE — Telephone Encounter (Signed)
Transition Care Management Unsuccessful Follow-up Telephone Call  Date of discharge and from where:  08/30/2021 [facility]- inpatient   Attempts:  1st Attempt  Reason for unsuccessful TCM follow-up call:  Left voice message

## 2021-09-06 NOTE — Telephone Encounter (Signed)
Transition Care Management Unsuccessful Follow-up Telephone Call  Date of discharge and from where:  08/30/2021 [facility] inpatient   Attempts:  2nd Attempt  Reason for unsuccessful TCM follow-up call:  Left voice message

## 2021-09-08 NOTE — Telephone Encounter (Signed)
Transition Care Management Unsuccessful Follow-up Telephone Call  Date of discharge and from where:  08/30/2021 [facility]  inpatient   Attempts:  3rd Attempt  Reason for unsuccessful TCM follow-up call:  Left voice message     Letter to be mailed to address on file

## 2021-09-21 ENCOUNTER — Encounter (HOSPITAL_COMMUNITY): Payer: Self-pay

## 2021-09-21 ENCOUNTER — Encounter: Payer: Self-pay | Admitting: Physician Assistant

## 2021-09-21 ENCOUNTER — Emergency Department (HOSPITAL_COMMUNITY): Payer: Medicare Other

## 2021-09-21 ENCOUNTER — Emergency Department (HOSPITAL_COMMUNITY)
Admission: EM | Admit: 2021-09-21 | Discharge: 2021-09-21 | Disposition: A | Discharge status: Home / Self Care | Payer: Medicare Other | Attending: Emergency Medicine | Admitting: Emergency Medicine

## 2021-09-21 ENCOUNTER — Other Ambulatory Visit: Payer: Self-pay

## 2021-09-21 DIAGNOSIS — Y92019 Unspecified place in single-family (private) house as the place of occurrence of the external cause: Secondary | ICD-10-CM | POA: Diagnosis not present

## 2021-09-21 DIAGNOSIS — Y9301 Activity, walking, marching and hiking: Secondary | ICD-10-CM | POA: Diagnosis not present

## 2021-09-21 DIAGNOSIS — M25561 Pain in right knee: Secondary | ICD-10-CM | POA: Insufficient documentation

## 2021-09-21 DIAGNOSIS — W19XXXA Unspecified fall, initial encounter: Secondary | ICD-10-CM | POA: Insufficient documentation

## 2021-09-21 DIAGNOSIS — M79605 Pain in left leg: Secondary | ICD-10-CM | POA: Insufficient documentation

## 2021-09-21 DIAGNOSIS — M545 Low back pain, unspecified: Secondary | ICD-10-CM | POA: Insufficient documentation

## 2021-09-21 DIAGNOSIS — M79652 Pain in left thigh: Secondary | ICD-10-CM | POA: Insufficient documentation

## 2021-09-21 DIAGNOSIS — F17210 Nicotine dependence, cigarettes, uncomplicated: Secondary | ICD-10-CM | POA: Diagnosis not present

## 2021-09-21 NOTE — ED Provider Notes (Signed)
[facility]ENCY DEPT [facility] Emergency Department Provider Note MRN:  [id number]  Arrival date & time: 09/21/21     Chief Complaint   Leg Injury   History of Present Illness   Gabriela Campbell is a 56 y.o. year-old female with a history of MS presenting to the ED with chief complaint of leg injury.  Patient was walking into her house and stumbled forward onto her knees.  Endorsing knee pain left greater than right, left thigh pain.  Mild low back pain.  Denies head trauma, no loss of consciousness, no neck pain, no chest pain, no shortness of breath, no abdominal pain.  Uses a cane at baseline.  Review of Systems  A thorough review of systems was obtained and all systems are negative except as noted in the HPI and PMH.   Patient's Health History    Past Medical History:  Diagnosis Date   Allergy    Multiple sclerosis (HCC)    Shingles    Weakness     Past Surgical History:  Procedure Laterality Date   ABDOMINAL HYSTERECTOMY     CHOLECYSTECTOMY      Family History  Problem Relation Age of Onset   Cancer Paternal Grandmother    Cancer Paternal Aunt    Cancer Paternal Aunt    Colon cancer Neg Hx    Stomach cancer Neg Hx    Esophageal cancer Neg Hx    Pancreatic cancer Neg Hx     Social History   Socioeconomic History   Marital status: Widowed    Spouse name: Not on file   Number of children: 0   Years of education: college   Highest education level: Not on file  Occupational History   Occupation: disable  Tobacco Use   Smoking status: Every Day    Packs/day: 0.50    Types: Cigarettes   Smokeless tobacco: Never  Vaping Use   Vaping Use: Never used  Substance and Sexual Activity   Alcohol use: No   Drug use: No   Sexual activity: Not on file  Other Topics Concern   Not on file  Social History Narrative   Lives at home with husband.   Caffeine use:  1-2 cups daily.   Right-handed.   Social Determinants of Health   Financial Resource Strain:  Not on file  Food Insecurity: Not on file  Transportation Needs: Not on file  Physical Activity: Not on file  Stress: Not on file  Social Connections: Not on file  Intimate Partner Violence: Not on file     Physical Exam   Vitals:   09/21/21 1938 09/21/21 2126  BP: (!) 151/93 133/89  Pulse: 80 68  Resp: 20 16  Temp: 99 F (37.2 C) 98.4 F (36.9 C)  SpO2: 100% 99%    CONSTITUTIONAL: Chronically ill-appearing, NAD NEURO/PSYCH:  Alert and oriented x 3, no focal deficits EYES:  eyes equal and reactive ENT/NECK:  no LAD, no JVD CARDIO: Regular rate, well-perfused, normal S1 and S2 PULM:  CTAB no wheezing or rhonchi GI/GU:  non-distended, non-tender MSK/SPINE:  No gross deformities, no edema SKIN: Minor abrasions to bilateral knees   *Additional and/or pertinent findings included in MDM below  Diagnostic and Interventional Summary    EKG Interpretation  Date/Time:    Ventricular Rate:    PR Interval:    QRS Duration:   QT Interval:    QTC Calculation:   R Axis:     Text Interpretation:  Labs Reviewed - No data to display  DG Knee Complete 4 Views Right  Final Result    DG Knee Complete 4 Views Left  Final Result    DG Hips Bilat W or Wo Pelvis 5 Views  Final Result      Medications - No data to display   Procedures  /  Critical Care Procedures  ED Course and Medical Decision Making  Initial Impression and Ddx Mechanical fall, patient is ambulatory, minimal signs of trauma on exam, normal range of motion of the hips, knees, does not have any midline spinal tenderness.  Past medical/surgical history that increases complexity of ED encounter: History of MS  Interpretation of Diagnostics I personally reviewed the knee x-ray and my interpretation is as follows: No fractures noted    Patient Reassessment and Ultimate Disposition/Management     Discharge  Patient management required discussion with the following services or consulting  groups:  None  Complexity of Problems Addressed Acute illness or injury that poses threat of life of bodily function  Additional Data Reviewed and Analyzed Further history obtained from: None  Additional Factors Impacting ED Encounter Risk None  Barth Kirks. Sedonia Small, MD [place] [email or web address]  Final Clinical Impressions(s) / ED Diagnoses     ICD-10-CM   1. Left leg pain  M79.605     2. Fall, initial encounter  W19.[facility]       ED Discharge Orders     None        Discharge Instructions Discussed with and Provided to Patient:    Discharge Instructions      You were evaluated in the Emergency Department and after careful evaluation, we did not find any emergent condition requiring admission or further testing in the hospital.  Your exam/testing today was overall reassuring.  X-rays did not show any broken bones or emergencies.  Recommend Tylenol and Motrin at home for discomfort.  Please return to the Emergency Department if you experience any worsening of your condition.  Thank you for allowing [place] to be a part of your care.       Maudie Flakes, MD 09/21/21 [phone]

## 2021-09-21 NOTE — ED Triage Notes (Signed)
BIB [facility] from home s/p mechanical fall. Fell onto knees and hands. Currently c/o L thigh pain. Was ambulatory on scene. No deformity. Did not hit head, no LOC. Hx of MS.

## 2021-09-21 NOTE — ED Provider Triage Note (Signed)
Emergency Medicine Provider Triage Evaluation Note  Gabriela Campbell , a 56 y.o. female  was evaluated in triage.  Pt complains of bilateral knee pain and hip pain after a fall today. She reports that she has MS and walks with a gain and fell to her knee walking through a door. Did not hit her knee. Denies any chest pain, abdominal pain, or back pain.  Review of Systems  Positive:  Negative:   Physical Exam  BP (!) 151/93   Pulse 80   Temp 99 F (37.2 C)   Resp 20   Ht 5\' 2"  (1.575 m)   Wt 48.8 kg   SpO2 100%   BMI 19.68 kg/m  Gen:   Awake, no distress   Resp:  Normal effort  MSK:   Moves extremities without difficulty  Other:  Compartments soft.   Medical Decision Making  Medically screening exam initiated at 8:01 PM.  Appropriate orders placed.  Gabriela Campbell was informed that the remainder of the evaluation will be completed by another provider, this initial triage assessment does not replace that evaluation, and the importance of remaining in the ED until their evaluation is complete.  XR ordered   Sherrell Puller, PA-C 09/21/21 2002

## 2021-09-21 NOTE — Discharge Instructions (Signed)
You were evaluated in the Emergency Department and after careful evaluation, we did not find any emergent condition requiring admission or further testing in the hospital.  Your exam/testing today was overall reassuring.  X-rays did not show any broken bones or emergencies.  Recommend Tylenol and Motrin at home for discomfort.  Please return to the Emergency Department if you experience any worsening of your condition.  Thank you for allowing [place] to be a part of your care.

## 2021-09-27 ENCOUNTER — Telehealth: Payer: Self-pay | Admitting: Neurology

## 2021-09-27 NOTE — Telephone Encounter (Signed)
LVM and sent MyChart msg informing pt of r/s needed for 01/08/22 appt- MD out.

## 2021-10-30 ENCOUNTER — Encounter: Payer: Self-pay | Admitting: Physician Assistant

## 2021-10-31 ENCOUNTER — Encounter: Payer: Self-pay | Admitting: Physician Assistant

## 2022-01-08 ENCOUNTER — Encounter: Payer: Self-pay | Admitting: Physician Assistant

## 2022-01-08 ENCOUNTER — Ambulatory Visit: Payer: 59 | Admitting: Neurology

## 2022-01-22 ENCOUNTER — Other Ambulatory Visit: Payer: Self-pay

## 2022-01-22 ENCOUNTER — Encounter: Payer: Self-pay | Admitting: Neurology

## 2022-01-22 ENCOUNTER — Telehealth: Payer: Self-pay | Admitting: Neurology

## 2022-01-22 ENCOUNTER — Ambulatory Visit (INDEPENDENT_AMBULATORY_CARE_PROVIDER_SITE_OTHER): Payer: Medicare Other | Admitting: Neurology

## 2022-01-22 VITALS — BP 147/91 | HR 79 | Ht 62.0 in

## 2022-01-22 DIAGNOSIS — G35 Multiple sclerosis: Secondary | ICD-10-CM | POA: Diagnosis not present

## 2022-01-22 DIAGNOSIS — R748 Abnormal levels of other serum enzymes: Secondary | ICD-10-CM | POA: Diagnosis not present

## 2022-01-22 DIAGNOSIS — E43 Unspecified severe protein-calorie malnutrition: Secondary | ICD-10-CM

## 2022-01-22 MED ORDER — DULOXETINE HCL 30 MG PO CPEP: 30.0000 mg | ORAL_CAPSULE | Freq: Every day | ORAL | 5 refills | Status: DC

## 2022-01-22 MED ORDER — DULOXETINE HCL 20 MG PO CPEP: 20.0000 mg | ORAL_CAPSULE | Freq: Every day | ORAL | 0 refills | Status: DC

## 2022-01-22 NOTE — Progress Notes (Signed)
Assessment and plan: Relapsing remitting multiple sclerosis Gait abnormality  Presented with gait abnormality since 2018, diagnosis was confirmed by abnormal MRI of the brain, also with significant involvement of cervical spine, and thoracic spinal cord MRI, spinal fluid testing showed more than 5 oligoclonal bands She was started on Tysabri since April 2019, until October 2021 when she was found to have significantly abnormal liver functional test, elevated alkaline phosphate, Will reconsider immunomodulation therapy after liver functional test recover  Abnormal liver functional test She is off long-term immunomodulation therapy since t October 2021, over the past couple years, was seen by hepatologist, continue have elevation of alkaline phosphate, abnormal liver functional test, liver biopsy showed chronic hepatitis with portal inflammation with ductal reaction, but no etiology confirmed, she is referred to [facility] hepatologist for evaluation evaluation, but never carried through due to lack of transportation,    Worsening gait abnormality Referral to home physical therapy  Depression anxiety Start low-dose of Cymbalta 20 mg daily, then titrating to 30 mg daily, if she can tolerate, we will continue to go up on Cymbalta, Repeat laboratory evaluations,   DIAGNOSTIC DATA (LABS, IMAGING, TESTING) - I reviewed patient records, labs, notes, testing and imaging myself where available.    Medical History :    Gabriela Campbell 57 year old female, relapsing remitting multiple sclerosis,   Past medical history of hysterectomy in 2003  cholecystectomy in 1999,  long time smoker, pack a day   She was highly functional until June 2018, without clear triggers, she began to notice bilateral anterior thigh muscle achy pain, spasm, also noticed gradual onset gait abnormality, which has progressively worse over the past few months, recently she also noticed bilateral hands numbness tingling,  clumsy of bilateral hand,   Initially she has mild low back pain, but no longer has significant low back pain, she denies neck pain, denies bowel bladder incontinence, she denies dysarthria, no diplopia no dysphagia.   Laboratory evaluation in February 2019, CBC showed elevated WBC of 15.3, hemoglobin of 16.6, liver functional test, BMP, negative troponin, CPK was 25, CMP showed elevated alkaline phosphate 156, UA showed evidence of bacteria, normal TSH, negative HIV,    MRI of cervical spine with without contrast at [facility] in March 2019 showed evidence of signal abnormality behind C3 and 4, with mild enhancement,   MRI of brain showed evidence of multiple area of abnormal T2/FLAIR hyperintensity signal in the white matter at both hemisphere predominantly periventricular, there was also T1 black holes, involving the lower pons, medulla, deep left cerebellar white matter, faint enhancement of at least one lesion in the left frontal lobe, unusual bulbous appearing of the anterior, inferior third ventricle in the region of the infundibular recess, with partial ringlike enhancement, hyper intense signal in the hypothalamus, right greater than left, possible he also optic asthma, right greater than left,   MRI of thoracic spine showed abnormal signal behind C7 T1-T2, also behind T6, T7, T8, some enhancement of the upper cord abnormality.   CSF showed more than 5 oligoclonal banding, with normal total protein 31  Imaging findings along with her history spinal fluid testing confirmed the diagnosis of relapsing remitting multiple sclerosis,    Laboratory evaluations in March 2019, vitamin D level was decreased 24, normal or negative NMO antibody were negative,Lyme titer, ANA, copper, ferritin level was mildly elevated 183, CPK, TSH, C-reactive protein, folic acid, A1c was 5.5, HIV, B12, RPR,    She started [place] since April 2019,  ,  she tolerated very well, there was no significant side effect  noticed, she continue have fatigue, taking Provigil 100 mg daily for her fatigue   But laboratory evaluation in February 2020 began to show significant abnormal liver functional test alkaline phosphate 523, AST 387, ALT 297, Dwyane Dee has been on hold since then,   Over the years, she has been complains of low back pain, lower extremity deep achy pain,  we have tried many different medications, either ineffective or she cannot tolerate it, Cymbalta, gabapentin, nortriptyline, NSAIDs Lyrica, Trileptal   She was followed by our clinic intermittently, had extensive GI evaluation by Dr. 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, ferritin has been between 200-500 range, liver biopsy June 2022 showed chronic hepatitis with portal inflammation with ductular reaction, moderate fibrosis, differentiation is broad but clear-cut etiology was not found, staining was not consistent with typical hemochromatosis, was also seen by hematologist for evaluation, do not think that hemochromatosis is causing of her liver disease,  Magnetic resonance cholangio pancreatography, on May 19, 2021 showed prominence of the extra and center intrahepatic biliary tree with the common duct measuring 8 mm but with gentle tapering to the level of the ampulla, dilation of the duct is similar to prior ultrasound, failure reservoir effect post cholecystectomy, no hepatic imaging finding to suggest cyanosis, no suspicious hepatic lesion,  CMP in April 2023: Continued elevation of alkaline phosphate, AST 54, ALT 51, actually this is better than her baseline level   Reviewed most recent MRI of the brain with  without contrast on May 19, 2021, white matter multiple sclerosis lesions, no contrast-enhancement,  Today she complains of fairly acute onset significant tingling, radiating to left left anterior lateral thigh, increased gait abnormality due to denies bowel and bladder incontinence, or upper extremity involvement  UPDATE Jan 22 2022: She is brought in by transportation today, alone at clinical visit, she lives with her father, he is down a child of her family, mother is at nursing home  She continued to decline slowly," feels irritated all the time", lost interest, does not do much at home, poor appetite, not sleeping well, worsening gait abnormality  Last laboratory evaluation was in April 2023: Abnormal liver functional test, elevated alkaline phosphate 385, AST 54, ALT 51     PHYSICAL EXAM  Vitals:   06/29/21 1329  BP: 138/82  Pulse: (!) 51  Weight: 107 lb 8 oz (48.8 kg)  Height: 5\' 2"  (1.575 m)   Body mass index is 19.66 kg/m.  PHYSICAL EXAMNIATION:  Gen: NAD, conversant, well nourised, well groomed                     Cardiovascular: Regular rate rhythm, no peripheral edema, warm, nontender. Eyes: Conjunctivae clear without exudates or hemorrhage Neck: Supple, no carotid bruits. Pulmonary: Clear to auscultation bilaterally   NEUROLOGICAL EXAM:  MENTAL STATUS: Speech/cognition: Depressed looking middle-age female, awake, alert oriented to history taking and casual conversation  CRANIAL NERVES: CN II: Visual fields are full to confrontation.  Pupils are round equal and briskly reactive to light. CN III, IV, VI: extraocular movement are normal. No ptosis. CN V: Facial sensation is intact to pinprick in all 3 divisions bilaterally. Corneal  responses are intact.  CN VII: Face is symmetric with normal eye closure and smile. CN VIII: Hearing is normal to casual conversation CN IX, X: Palate elevates symmetrically. Phonation is normal. CN XI: Head turning and shoulder  shrug are intact CN XII: Tongue is midline with normal movements and no atrophy.  MOTOR: Upper extremity motor strength is normal, with action tremor, variable effort on lower extremity, felt there was mild moderate bilateral hip flexion weakness  REFLEXES: Reflexes are 3 and symmetric at the biceps, triceps, knees, and ankles.   SENSORY: Intact to light touch   COORDINATION: Rapid alternating movements and fine finger movements are intact. There is no dysmetria on finger-to-nose and heel-knee-shin.    GAIT/STANCE: Need push-up to get up from seated position, very unsteady gait    REVIEW OF SYSTEMS: Out of a complete 14 system review of symptoms, the patient complains only of the following symptoms, and all other reviewed systems are negative.  See HPI  ALLERGIES: Allergies  Allergen Reactions   Dilaudid [Hydromorphone Hcl] Anaphylaxis   Ibuprofen Anaphylaxis   Iodine Anaphylaxis   Penicillins Anaphylaxis    Has patient had a PCN reaction causing immediate rash, facial/tongue/throat swelling, SOB or lightheadedness with hypotension:yes Has patient had a PCN reaction causing severe rash involving mucus membranes or skin necrosis: no Has patient had a PCN reaction that required hospitalization no Has patient had a PCN reaction occurring within the last 10 years: no If all of the above answers are "NO", then may proceed with Cephalosporin use.    Gabapentin Other (See Comments)    GI upset   Codeine Nausea Only   Tramadol Nausea And Vomiting    HOME MEDICATIONS: Outpatient Medications Prior to Visit  Medication Sig Dispense Refill   Cholecalciferol (VITAMIN D3) 25 MCG (1000 UT) CAPS Take 2,000 Units by mouth daily.     ALPRAZolam (XANAX) 1 MG tablet Take 1-2 tablets before MRI additional pill can be used as going back for MRI. (Patient taking differently: Take 1 mg by mouth as needed (mri).) 3 tablet 0   No facility-administered medications prior to visit.    PAST  MEDICAL HISTORY: Past Medical History:  Diagnosis Date   Allergy    Multiple sclerosis (HCC)    Shingles    Weakness     PAST SURGICAL HISTORY: Past Surgical History:  Procedure Laterality Date   ABDOMINAL HYSTERECTOMY     CHOLECYSTECTOMY      FAMILY HISTORY: Family History  Problem Relation Age of Onset   Cancer Paternal Grandmother    Cancer Paternal Aunt    Cancer Paternal Aunt    Colon cancer Neg Hx    Stomach cancer Neg Hx    Esophageal cancer Neg Hx    Pancreatic cancer Neg Hx     SOCIAL HISTORY: Social History   Socioeconomic History   Marital status: Widowed    Spouse name: Not on file   Number of children: 0   Years of education: college   Highest education level: Not on file  Occupational History   Occupation: disable  Tobacco Use   Smoking status: Every Day    Packs/day: 0.50    Types: Cigarettes   Smokeless tobacco: Never  Vaping Use   Vaping Use: Never used  Substance and Sexual Activity   Alcohol use: No   Drug use: No   Sexual activity: Not on file  Other Topics Concern   Not on file  Social History Narrative   Lives at  home with husband.   Caffeine use:  1-2 cups daily.   Right-handed.   Social Determinants of Health   Financial Resource Strain: Not on file  Food Insecurity: Not on file  Transportation Needs: Not on file  Physical Activity: Not on file  Stress: Not on file  Social Connections: Not on file  Intimate Partner Violence: Not on file

## 2022-01-22 NOTE — Addendum Note (Signed)
Addended by: Verlin Grills on: 01/22/2022 02:31 PM   Modules accepted: Orders

## 2022-01-22 NOTE — Telephone Encounter (Signed)
Pt has been accepted by [facility], they will reach out to her to set up.

## 2022-02-02 LAB — DRUG SCREEN 10 W/CONF, SERUM
Amphetamines, IA: NEGATIVE ng/mL
Barbiturates, IA: NEGATIVE ug/mL
Benzodiazepines, IA: NEGATIVE ng/mL
Cocaine & Metabolite, IA: NEGATIVE ng/mL
Methadone, IA: NEGATIVE ng/mL
Opiates, IA: NEGATIVE ng/mL
Oxycodones, IA: POSITIVE ng/mL — AB
Phencyclidine, IA: NEGATIVE ng/mL
Propoxyphene, IA: NEGATIVE ng/mL
THC(Marijuana) Metabolite, IA: POSITIVE ng/mL — AB

## 2022-02-02 LAB — CBC WITH DIFFERENTIAL/PLATELET
Basophils Absolute: 0 10*3/uL (ref 0.0–0.2)
Basos: 0 %
EOS (ABSOLUTE): 0.1 10*3/uL (ref 0.0–0.4)
Eos: 1 %
Hematocrit: 39.4 % (ref 34.0–46.6)
Hemoglobin: 13.5 g/dL (ref 11.1–15.9)
Immature Grans (Abs): 0 10*3/uL (ref 0.0–0.1)
Immature Granulocytes: 0 %
Lymphocytes Absolute: 1.9 10*3/uL (ref 0.7–3.1)
Lymphs: 22 %
MCH: 32.6 pg (ref 26.6–33.0)
MCHC: 34.3 g/dL (ref 31.5–35.7)
MCV: 95 fL (ref 79–97)
Monocytes Absolute: 0.4 10*3/uL (ref 0.1–0.9)
Monocytes: 5 %
Neutrophils Absolute: 6.4 10*3/uL (ref 1.4–7.0)
Neutrophils: 72 %
Platelets: 152 10*3/uL (ref 150–450)
RBC: 4.14 x10E6/uL (ref 3.77–5.28)
RDW: 12.3 % (ref 11.7–15.4)
WBC: 8.9 10*3/uL (ref 3.4–10.8)

## 2022-02-02 LAB — COMPREHENSIVE METABOLIC PANEL
ALT: 52 IU/L — ABNORMAL HIGH (ref 0–32)
AST: 29 IU/L (ref 0–40)
Albumin/Globulin Ratio: 1.9 (ref 1.2–2.2)
Albumin: 4.1 g/dL (ref 3.8–4.9)
Alkaline Phosphatase: 347 IU/L — ABNORMAL HIGH (ref 44–121)
BUN/Creatinine Ratio: 14 (ref 9–23)
BUN: 11 mg/dL (ref 6–24)
Bilirubin Total: 0.3 mg/dL (ref 0.0–1.2)
CO2: 26 mmol/L (ref 20–29)
Calcium: 9 mg/dL (ref 8.7–10.2)
Chloride: 102 mmol/L (ref 96–106)
Creatinine, Ser: 0.78 mg/dL (ref 0.57–1.00)
Globulin, Total: 2.2 g/dL (ref 1.5–4.5)
Glucose: 84 mg/dL (ref 70–99)
Potassium: 3.1 mmol/L — ABNORMAL LOW (ref 3.5–5.2)
Sodium: 144 mmol/L (ref 134–144)
Total Protein: 6.3 g/dL (ref 6.0–8.5)
eGFR: 88 mL/min/{1.73_m2} (ref >59)

## 2022-02-02 LAB — SEDIMENTATION RATE: Sed Rate: 10 mm/hr (ref 0–40)

## 2022-02-02 LAB — OXYCODONES,MS,WB/SP RFX
Oxycocone: 5.1 ng/mL
Oxycodones Confirmation: POSITIVE
Oxymorphone: NEGATIVE ng/mL

## 2022-02-02 LAB — THC,MS,WB/SP RFX
Cannabidiol: NEGATIVE ng/mL
Cannabinoid Confirmation: POSITIVE
Carboxy-THC: 4.8 ng/mL
Hydroxy-THC: NEGATIVE ng/mL
Tetrahydrocannabinol(THC): NEGATIVE ng/mL

## 2022-02-02 LAB — ANA W/REFLEX IF POSITIVE: Anti Nuclear Antibody (ANA): NEGATIVE

## 2022-02-02 LAB — CK: Total CK: 51 U/L (ref 32–182)

## 2022-02-02 LAB — THYROID PANEL WITH TSH
Free Thyroxine Index: 1.5 (ref 1.2–4.9)
T3 Uptake Ratio: 22 % — ABNORMAL LOW (ref 24–39)
T4, Total: 6.8 ug/dL (ref 4.5–12.0)
TSH: 1.4 u[IU]/mL (ref 0.450–4.500)

## 2022-02-02 LAB — C-REACTIVE PROTEIN: CRP: <1 mg/L (ref 0–10)

## 2022-02-02 LAB — VITAMIN D 25 HYDROXY (VIT D DEFICIENCY, FRACTURES): Vit D, 25-Hydroxy: 20.4 ng/mL — ABNORMAL LOW (ref 30.0–100.0)

## 2022-02-05 ENCOUNTER — Telehealth: Payer: Self-pay | Admitting: Neurology

## 2022-02-05 NOTE — Telephone Encounter (Signed)
Please call patient, laboratory evaluation showed  --- Decreased vitamin D level 20.4, she would benefit over-the-counter D3 supplement, 1000 units daily   ----Positive marijuana, oxycodone, I do not see narcotic prescription on her medication list, not sure where she get oxycodone, for what reason,  ----Continue with evidence of abnormal liver functional test, alkaline phosphate 347, ALT 52, this is about her baseline level, please advise patient, medication use, can cause abnormal liver functional test  Continue follow-up visit, suggest her complete GI/liver workup  Significant abnormal MRI of the brain, also evidence of cervical and thoracic spinal cord involvement from her multiple sclerosis, recently increased gait abnormality, would like to consider long-term immunomodulation therapy

## 2022-02-06 NOTE — Telephone Encounter (Signed)
I left the patient a voice mail to return our call.

## 2022-02-06 NOTE — Telephone Encounter (Signed)
Pt is calling. Requesting a call back from nurse.  

## 2022-02-06 NOTE — Telephone Encounter (Signed)
I called and provided results for the patient. She stated she ran out of vitamin d3 for a month, has recently started taking it again.   She denied any use of oxycodone or marijuana. I informed the patient of her abnormal liver function test, recommended GI/liver work up.  I informed her of the results of her MRI. She would like more information on options for long-term immunomodulation therapy.

## 2022-02-07 NOTE — Telephone Encounter (Signed)
Many similar discussion in the past, with her abnormal liver functional test, all long-term immunomodulation therapy would potentially exacerbate her liver conditions, she needs to complete GI workup, will have further discussion of treatment option at next follow-up visit

## 2022-02-27 NOTE — Telephone Encounter (Signed)
Orders faxed to [facility]

## 2022-03-06 NOTE — Telephone Encounter (Signed)
[facility]) Had a visit with pt today.Want to make neurologist aware BP trending upward:today;178/108, visit 02/26/22;160/84, visit 02/21/22 158-88, visit 02/15/22;156/84. Have made increase in BP over the last month, pain in left leg.

## 2022-03-07 ENCOUNTER — Encounter: Payer: Self-pay | Admitting: Physician Assistant

## 2022-03-07 NOTE — Telephone Encounter (Signed)
Please make sure patient document her blood pressure daily, also make sure she has her primary care aware of her elevated blood pressure trend

## 2022-03-07 NOTE — Telephone Encounter (Signed)
Called pt and left her a detail message per DPR informing her of the message Dr. Krista Blue sent.  Please make sure patient document her blood pressure daily, also make sure she has her primary care aware of her elevated blood pressure trend.

## 2022-03-12 ENCOUNTER — Encounter: Payer: Self-pay | Admitting: Neurology

## 2022-03-12 NOTE — Telephone Encounter (Signed)
Error

## 2022-03-12 NOTE — Telephone Encounter (Addendum)
[facility]) calling to make neurologist aware pt's blood pressure 166/94. Pt do not have a blood pressure cuff to monitor blood pressure. Contact info: [phone].

## 2022-03-12 NOTE — Telephone Encounter (Signed)
Please let patient contact her primary care physician for better blood pressure control, update her PCP information in chart,

## 2022-03-13 NOTE — Telephone Encounter (Signed)
After checking DPR, a vm was left asking pt to call office so message from Dr Krista Blue could be relayed from 03/12/22

## 2022-03-13 NOTE — Telephone Encounter (Signed)
Pt left a vm calling back.  The message from Dr Krista Blue was relayed, pt states she has to find a new PCP. No call back requested

## 2022-03-19 ENCOUNTER — Telehealth: Payer: Self-pay | Admitting: Neurology

## 2022-03-19 NOTE — Telephone Encounter (Signed)
[name] called requesting VO for an extension of PT for 1x 8 weeks VO can be left on her secure VM.

## 2022-03-20 NOTE — Telephone Encounter (Signed)
Orders given LVM

## 2022-04-05 NOTE — Telephone Encounter (Signed)
[name] called from [facility]. Stated pt blood pressure is high 170/96. Stated pt doesn't have pcp and want to know if Dr. Krista Blue can see her before July. Eustaquio Maize is requesting a call [phone]

## 2022-04-05 NOTE — Telephone Encounter (Signed)
Called beth informed her of message Sarah sent, We do not manage her BP, needs to get re-established with PCP. Can go to urgent care if continues to be persistently elevated. Similar phone call a few weeks ago. She said she would inform pt of message.

## 2022-04-09 ENCOUNTER — Other Ambulatory Visit: Payer: Self-pay

## 2022-04-09 ENCOUNTER — Encounter: Payer: Self-pay | Admitting: Physician Assistant

## 2022-04-09 ENCOUNTER — Emergency Department (HOSPITAL_COMMUNITY)
Admission: EM | Admit: 2022-04-09 | Discharge: 2022-04-09 | Disposition: A | Discharge status: Home / Self Care | Payer: Medicare Other | Attending: Emergency Medicine | Admitting: Emergency Medicine

## 2022-04-09 DIAGNOSIS — E876 Hypokalemia: Secondary | ICD-10-CM

## 2022-04-09 DIAGNOSIS — I10 Essential (primary) hypertension: Secondary | ICD-10-CM | POA: Diagnosis present

## 2022-04-09 DIAGNOSIS — Z79899 Other long term (current) drug therapy: Secondary | ICD-10-CM | POA: Insufficient documentation

## 2022-04-09 LAB — CBC WITH DIFFERENTIAL/PLATELET
Abs Immature Granulocytes: 0.02 10*3/uL (ref 0.00–0.07)
Basophils Absolute: 0 10*3/uL (ref 0.0–0.1)
Basophils Relative: 0 %
Eosinophils Absolute: 0.1 10*3/uL (ref 0.0–0.5)
Eosinophils Relative: 1 %
HCT: 42.1 % (ref 36.0–46.0)
Hemoglobin: 14.7 g/dL (ref 12.0–15.0)
Immature Granulocytes: 0 %
Lymphocytes Relative: 24 %
Lymphs Abs: 2.2 10*3/uL (ref 0.7–4.0)
MCH: 33.7 pg (ref 26.0–34.0)
MCHC: 34.9 g/dL (ref 30.0–36.0)
MCV: 96.6 fL (ref 80.0–100.0)
Monocytes Absolute: 0.5 10*3/uL (ref 0.1–1.0)
Monocytes Relative: 6 %
Neutro Abs: 6.3 10*3/uL (ref 1.7–7.7)
Neutrophils Relative %: 69 %
Platelets: 139 10*3/uL — ABNORMAL LOW (ref 150–400)
RBC: 4.36 MIL/uL (ref 3.87–5.11)
RDW: 11.9 % (ref 11.5–15.5)
WBC: 9.2 10*3/uL (ref 4.0–10.5)
nRBC: 0 % (ref 0.0–0.2)

## 2022-04-09 LAB — BASIC METABOLIC PANEL
Anion gap: 10 (ref 5–15)
BUN: 20 mg/dL (ref 6–20)
CO2: 32 mmol/L (ref 22–32)
Calcium: 8.9 mg/dL (ref 8.9–10.3)
Chloride: 101 mmol/L (ref 98–111)
Creatinine, Ser: 0.85 mg/dL (ref 0.44–1.00)
GFR, Estimated: >60 mL/min (ref >60)
Glucose, Bld: 83 mg/dL (ref 70–99)
Potassium: 2.3 mmol/L — CL (ref 3.5–5.1)
Sodium: 143 mmol/L (ref 135–145)

## 2022-04-09 LAB — MAGNESIUM: Magnesium: 1.8 mg/dL (ref 1.7–2.4)

## 2022-04-09 MED ORDER — POTASSIUM CHLORIDE CRYS ER 20 MEQ PO TBCR: 20.0000 meq | EXTENDED_RELEASE_TABLET | Freq: Two times a day (BID) | ORAL | 0 refills | Status: DC

## 2022-04-09 MED ORDER — AMLODIPINE BESYLATE 5 MG PO TABS: 5.0000 mg | ORAL_TABLET | Freq: Every day | ORAL | 1 refills | Status: DC

## 2022-04-09 MED ORDER — POTASSIUM CHLORIDE CRYS ER 20 MEQ PO TBCR
40.0000 meq | EXTENDED_RELEASE_TABLET | Freq: Once | ORAL | Status: AC
  Administered 2022-04-09: 40 meq via ORAL
  Filled 2022-04-09: qty 2

## 2022-04-09 MED ORDER — POTASSIUM CHLORIDE 10 MEQ/100ML IV SOLN
10.0000 meq | Freq: Once | INTRAVENOUS | Status: AC
  Administered 2022-04-09: 10 meq via INTRAVENOUS
  Filled 2022-04-09: qty 100

## 2022-04-09 MED ORDER — HYDROCODONE-ACETAMINOPHEN 5-325 MG PO TABS
1.0000 | ORAL_TABLET | Freq: Once | ORAL | Status: AC
Start: 2022-04-09 — End: 2022-04-09
  Administered 2022-04-09: 1 via ORAL
  Filled 2022-04-09: qty 1

## 2022-04-09 NOTE — ED Provider Notes (Signed)
  Physical Exam  BP (!) 185/103   Pulse 61   Temp 98.5 F (36.9 C)   Resp 16   Wt 48 kg   SpO2 98%   BMI 19.35 kg/m   Physical Exam  Procedures  Procedures  ED Course / MDM    Medical Decision Making Amount and/or Complexity of Data Reviewed Labs: ordered.  Risk Prescription drug management.   Received patient in signout.  Hypertension.  Uncontrolled.  Also found to have hypokalemia.  Reassuring magnesium.  With a level to 2.3 given 1 mill equivalent IV and given 40 oral.  Will start oral supplementation.  Also start blood pressure medicines.  Outpatient follow-up.       Benjiman Core, MD 04/09/22 1924

## 2022-04-09 NOTE — Discharge Instructions (Addendum)
You are seen in the emergency room for elevated blood pressure.  Please take the medication that is prescribed.  Please call the number provided to set up an appointment with PCP.  You can also call your insurance company and find in network Doctor that will take over managing your blood pressure appropriately.

## 2022-04-09 NOTE — ED Triage Notes (Addendum)
BIBA from home, home physical therapist referred to ED due to bp 110/102. Pt reports HTN x1 month w/o hx  Denies headache, n/v, dizziness.

## 2022-04-09 NOTE — ED Provider Notes (Signed)
  [place] EMERGENCY DEPARTMENT AT [facility] Provider Note   CSN: [id number] Arrival date & time: 04/09/22  1405     History {Add pertinent medical, surgical, social history, OB history to HPI:1} Chief Complaint  Patient presents with   Hypertension    Gabriela Campbell is a 57 y.o. female.  HPI    57 year old female comes in with chief complaint of elevated blood pressure. Home Medications Prior to Admission medications   Medication Sig Start Date End Date Taking? Authorizing Provider  amLODipine (NORVASC) 5 MG tablet Take 1 tablet (5 mg total) by mouth daily. 04/09/22  Yes Derwood Kaplan, MD  Cholecalciferol (VITAMIN D3) 25 MCG (1000 UT) CAPS Take 2,000 Units by mouth daily.    [provider]  DULoxetine (CYMBALTA) 20 MG capsule Take 1 capsule (20 mg total) by mouth daily. 01/22/22   Levert Feinstein, MD  DULoxetine (CYMBALTA) 30 MG capsule Take 1 capsule (30 mg total) by mouth daily. Fill Cymbalta 20mg  for the first month 01/22/22   Levert Feinstein, MD      Allergies    Dilaudid [hydromorphone hcl], Ibuprofen, Iodine, Penicillins, Gabapentin, Codeine, and Tramadol    Review of Systems   Review of Systems  Physical Exam Updated Vital Signs BP (!) 204/108 (BP Location: Right Arm) Comment: recheck b/p right arm  Pulse 69   Temp 98.5 F (36.9 C) (Oral)   Resp 18   Wt 48 kg   SpO2 100%   BMI 19.35 kg/m  Physical Exam  ED Results / Procedures / Treatments   Labs (all labs ordered are listed, but only abnormal results are displayed) Labs Reviewed  BASIC METABOLIC PANEL  CBC WITH DIFFERENTIAL/PLATELET    EKG None  Radiology No results found.  Procedures Procedures  {Document cardiac monitor, telemetry assessment procedure when appropriate:1}  Medications Ordered in ED Medications  HYDROcodone-acetaminophen (NORCO/VICODIN) 5-325 MG per tablet 1 tablet (1 tablet Oral Given 04/09/22 1512)    ED Course/ Medical Decision Making/ A&P   {   Click  here for ABCD2, HEART and other calculatorsREFRESH Note before signing :1}                          Medical Decision Making Amount and/or Complexity of Data Reviewed Labs: ordered.  Risk Prescription drug management.   ***  {Document critical care time when appropriate:1} {Document review of labs and clinical decision tools ie heart score, Chads2Vasc2 etc:1}  {Document your independent review of radiology images, and any outside records:1} {Document your discussion with family members, caretakers, and with consultants:1} {Document social determinants of health affecting pt's care:1} {Document your decision making why or why not admission, treatments were needed:1} Final Clinical Impression(s) / ED Diagnoses Final diagnoses:  Uncontrolled hypertension    Rx / DC Orders ED Discharge Orders          Ordered    amLODipine (NORVASC) 5 MG tablet  Daily        04/09/22 1540

## 2022-04-09 NOTE — ED Notes (Signed)
Assumed care of this pt. Pt is CAOx4. Equal chest rise and fall noted without incident. Pt is complaining of hypertension. Pt denies headache or dizziness. Pt is complaining of thigh pain and rates it at a 9. Pt states that thigh pain is chronic due to her MS.

## 2022-04-10 ENCOUNTER — Telehealth: Payer: Self-pay

## 2022-04-10 ENCOUNTER — Encounter: Payer: Self-pay | Admitting: Physician Assistant

## 2022-04-10 NOTE — Transitions of Care (Post Inpatient/ED Visit) (Signed)
   04/10/2022  Name: Gabriela Campbell MRN: [id number] DOB: 1965/04/07  Today's TOC FU Call Status: Today's TOC FU Call Status:: Unsuccessul Call (1st Attempt) Unsuccessful Call (1st Attempt) Date: 04/10/22  Attempted to reach the patient regarding the most recent Inpatient/ED visit.  Follow Up Plan: No further outreach attempts will be made at this time. We have been unable to contact the patient. No PCP Signature Karena Addison, LPN [facility] Nurse Health Advisor Direct Dial [phone]

## 2022-05-07 ENCOUNTER — Emergency Department (HOSPITAL_COMMUNITY): Payer: Medicare Other

## 2022-05-07 ENCOUNTER — Emergency Department (HOSPITAL_COMMUNITY)
Admission: EM | Admit: 2022-05-07 | Discharge: 2022-05-07 | Disposition: A | Discharge status: Home / Self Care | Payer: Medicare Other | Attending: Emergency Medicine | Admitting: Emergency Medicine

## 2022-05-07 ENCOUNTER — Encounter (HOSPITAL_COMMUNITY): Payer: Self-pay

## 2022-05-07 ENCOUNTER — Other Ambulatory Visit: Payer: Self-pay

## 2022-05-07 DIAGNOSIS — Y9301 Activity, walking, marching and hiking: Secondary | ICD-10-CM | POA: Insufficient documentation

## 2022-05-07 DIAGNOSIS — W010XXA Fall on same level from slipping, tripping and stumbling without subsequent striking against object, initial encounter: Secondary | ICD-10-CM | POA: Diagnosis not present

## 2022-05-07 DIAGNOSIS — M545 Low back pain, unspecified: Secondary | ICD-10-CM | POA: Insufficient documentation

## 2022-05-07 DIAGNOSIS — W19XXXA Unspecified fall, initial encounter: Secondary | ICD-10-CM

## 2022-05-07 DIAGNOSIS — Y92009 Unspecified place in unspecified non-institutional (private) residence as the place of occurrence of the external cause: Secondary | ICD-10-CM | POA: Insufficient documentation

## 2022-05-07 MED ORDER — OXYCODONE-ACETAMINOPHEN 5-325 MG PO TABS
1.0000 | ORAL_TABLET | Freq: Once | ORAL | Status: AC
  Administered 2022-05-07: 1 via ORAL
  Filled 2022-05-07: qty 1

## 2022-05-07 NOTE — ED Triage Notes (Addendum)
Per EMS, Pt, from home, c/o low back r/t a fall this afternoon.  Pain score 10/10.  Pt has a history of MS and frequent falls.  Pt reports she has PT once per week.

## 2022-05-07 NOTE — ED Provider Notes (Signed)
[place] [facility]MENT AT [facility] Provider Note   CSN: [id number] Arrival date & time: 05/07/22  1556     History Chief Complaint  Patient presents with   Fall   Back Pain    HPI Gabriela Campbell is a 57 y.o. female presenting for chief complaint of ground-level fall.  She is a 57 year old female who lives at home.  Has a history of frequent falls and MS is supposed to be using her walker but was not today.  States that she lost her balance while walking back from the bathroom and collapsed.  She fell directly onto her tailbone and back has low back and lumbar pain.  Also has some pain with a deep breath and pelvic pain.  States that the pain in her bilateral hips is chronic.  Denies any new fevers chills nausea vomiting syncope or shortness of breath.  No known sick contacts otherwise.   Patient's recorded medical, surgical, social, medication list and allergies were reviewed in the Snapshot window as part of the initial history.   Review of Systems   Review of Systems  Constitutional:  Negative for chills and fever.  HENT:  Negative for ear pain and sore throat.   Eyes:  Negative for pain and visual disturbance.  Respiratory:  Negative for cough and shortness of breath.   Cardiovascular:  Negative for chest pain and palpitations.  Gastrointestinal:  Negative for abdominal pain and vomiting.  Genitourinary:  Negative for dysuria and hematuria.  Musculoskeletal:  Positive for back pain. Negative for arthralgias.  Skin:  Negative for color change and rash.  Neurological:  Negative for seizures and syncope.  All other systems reviewed and are negative.   Physical Exam Updated Vital Signs BP 138/87   Pulse 76   Temp 98.1 F (36.7 C)   Resp 17   Wt 47.6 kg   SpO2 98%   BMI 19.20 kg/m  Physical Exam Vitals and nursing note reviewed.  Constitutional:      General: She is not in acute distress.    Appearance: She is well-developed.  HENT:      Head: Normocephalic and atraumatic.  Eyes:     Conjunctiva/sclera: Conjunctivae normal.  Cardiovascular:     Rate and Rhythm: Normal rate and regular rhythm.     Heart sounds: No murmur heard. Pulmonary:     Effort: Pulmonary effort is normal. No respiratory distress.     Breath sounds: Normal breath sounds.  Abdominal:     General: There is no distension.     Palpations: Abdomen is soft.     Tenderness: There is no abdominal tenderness. There is no right CVA tenderness or left CVA tenderness.  Musculoskeletal:        General: No swelling or tenderness. Normal range of motion.     Cervical back: Neck supple.  Skin:    General: Skin is warm and dry.  Neurological:     General: No focal deficit present.     Mental Status: She is alert and oriented to person, place, and time. Mental status is at baseline.     Cranial Nerves: No cranial nerve deficit.      ED Course/ Medical Decision Making/ A&P Clinical Course as of 05/07/22 2219  Mon May 07, 2022  1720 [facility]  Walking to bathroom and legs gave out N/V/D [CC]    Clinical Course User Index [CC] Glyn Ade, MD    Procedures Procedures   Medications Ordered in ED  Medications  oxyCODONE-acetaminophen (PERCOCET/ROXICET) 5-325 MG per tablet 1 tablet (1 tablet Oral Given 05/07/22 1727)  oxyCODONE-acetaminophen (PERCOCET/ROXICET) 5-325 MG per tablet 1 tablet (1 tablet Oral Given 05/07/22 2214)   Medical Decision Making:   Gabriela Campbell is a 57 y.o. female who presented to the ED today with a fall at their living facility detailed above. They are not on a blood thinner. Complete initial physical exam performed, notably the patient  was hemodynamically stable  in no acute distress. No obvious deformities or injuries appreciated on extensive physical exam including active range of motion of all joints.     Reviewed and confirmed nursing documentation for past medical history, family history, social history.    Initial  Assessment/Plan:   This is a patient presenting with a moderate blunt mechanism trauma.  As such, I have considered intracranial injuries including intracranial hemorrhage, intrathoracic injuries including blunt myocardial or blunt lung injury, blunt abdominal injuries including aortic dissection, bladder injury, spleen injury, liver injury and I have considered orthopedic injuries including extremity or spinal injury. This is most consistent with an acute life/limb threatening illness complicated by underlying chronic conditions.  With the patient's presentation of moderate mechanism trauma but an otherwise reassuring exam, patient warrants targeted evaluation for potential traumatic injuries. Will proceed with targeted evaluation for potential injuries. Will proceed with CT LT spine, and Chest/Pelvis XR.   Images reviewed and agree with radiology interpretation.  CT Lumbar Spine Wo Contrast  Result Date: 05/07/2022 CLINICAL DATA:  Fall EXAM: CT THORACIC AND LUMBAR SPINE WITHOUT CONTRAST TECHNIQUE: Multidetector CT imaging of the thoracic and lumbar spine was performed without contrast. Multiplanar CT image reconstructions were also generated. RADIATION DOSE REDUCTION: This exam was performed according to the departmental dose-optimization program which includes automated exposure control, adjustment of the mA and/or kV according to patient size and/or use of iterative reconstruction technique. COMPARISON:  None Available. FINDINGS: CT THORACIC SPINE FINDINGS Alignment: Normal. Vertebrae: No acute fracture or focal pathologic process. Paraspinal and other soft tissues: Negative. Disc levels: No spinal canal stenosis. CT LUMBAR SPINE FINDINGS Segmentation: 5 lumbar type vertebrae. Alignment: Normal. Vertebrae: No acute fracture or focal pathologic process. Paraspinal and other soft tissues: Negative. Disc levels: No spinal canal stenosis. IMPRESSION: No acute abnormality of the thoracic or lumbar spine.  Electronically Signed   By: Deatra Robinson M.D.   On: 05/07/2022 22:11   CT Thoracic Spine Wo Contrast  Result Date: 05/07/2022 CLINICAL DATA:  Fall EXAM: CT THORACIC AND LUMBAR SPINE WITHOUT CONTRAST TECHNIQUE: Multidetector CT imaging of the thoracic and lumbar spine was performed without contrast. Multiplanar CT image reconstructions were also generated. RADIATION DOSE REDUCTION: This exam was performed according to the departmental dose-optimization program which includes automated exposure control, adjustment of the mA and/or kV according to patient size and/or use of iterative reconstruction technique. COMPARISON:  None Available. FINDINGS: CT THORACIC SPINE FINDINGS Alignment: Normal. Vertebrae: No acute fracture or focal pathologic process. Paraspinal and other soft tissues: Negative. Disc levels: No spinal canal stenosis. CT LUMBAR SPINE FINDINGS Segmentation: 5 lumbar type vertebrae. Alignment: Normal. Vertebrae: No acute fracture or focal pathologic process. Paraspinal and other soft tissues: Negative. Disc levels: No spinal canal stenosis. IMPRESSION: No acute abnormality of the thoracic or lumbar spine. Electronically Signed   By: Deatra Robinson M.D.   On: 05/07/2022 22:11   DG Pelvis Portable  Result Date: 05/07/2022 CLINICAL DATA:  Fall. EXAM: PORTABLE PELVIS 1-2 VIEWS COMPARISON:  09/21/2021 FINDINGS: The cortical margins of the  bony pelvis are intact. No fracture. Pubic symphysis and sacroiliac joints are congruent. Both femoral heads are well-seated in the respective acetabula. IMPRESSION: No pelvic fracture. Electronically Signed   By: Narda Rutherford M.D.   On: 05/07/2022 18:03   DG Chest 2 View  Result Date: 05/07/2022 CLINICAL DATA:  Fall. EXAM: CHEST - 2 VIEW COMPARISON:  Chest radiograph dated 12/04/2018. FINDINGS: No focal consolidation, pleural effusion, or pneumothorax. Biapical subpleural scarring. The cardiac silhouette is within normal limits. No acute osseous pathology.  IMPRESSION: No active cardiopulmonary disease. Electronically Signed   By: Elgie Collard M.D.   On: 05/07/2022 18:02    Final Reassessment and Plan:   Objective findings with no focal pathology.  Patient stable for outpatient care management with follow-up with her primary care provider.  Patient feels stable at this time.  Pain is well under control.  Disposition:  I have considered need for hospitalization, however, considering all of the above, I believe this patient is stable for discharge at this time.  Patient/family educated about specific return precautions for given chief complaint and symptoms.  Patient/family educated about follow-up with PCP.     Patient/family expressed understanding of return precautions and need for follow-up. Patient spoken to regarding all imaging and laboratory results and appropriate follow up for these results. All education provided in verbal form with additional information in written form. Time was allowed for answering of patient questions. Patient discharged.    Emergency Department Medication Summary:   Medications  oxyCODONE-acetaminophen (PERCOCET/ROXICET) 5-325 MG per tablet 1 tablet (1 tablet Oral Given 05/07/22 1727)  oxyCODONE-acetaminophen (PERCOCET/ROXICET) 5-325 MG per tablet 1 tablet (1 tablet Oral Given 05/07/22 2214)     Clinical Impression:  1. Acute low back pain, unspecified back pain laterality, unspecified whether sciatica present   2. Fall in home, initial encounter      Discharge   Final Clinical Impression(s) / ED Diagnoses Final diagnoses:  Acute low back pain, unspecified back pain laterality, unspecified whether sciatica present  Fall in home, initial encounter    Rx / DC Orders ED Discharge Orders     None         Glyn Ade, MD 05/07/22 2302

## 2022-05-07 NOTE — ED Provider Triage Note (Signed)
Emergency Medicine Provider Triage Evaluation Note  Gabriela Campbell , a 57 y.o. female  was evaluated in triage.  Pt complains of severe thoracic and lumbar back pain after a mechanical fall.  Patient states her legs gave out on her and she landed hard on her butt.  No history of spinal fracture.  She does have history of MS.  Denies LOC, head or neck pain.   Review of Systems  Positive: As above Negative: As above  Physical Exam  BP (!) 146/93   Pulse 79   Temp 98 F (36.7 C) (Oral)   Resp 17   Wt 47.6 kg   SpO2 98%   BMI 19.20 kg/m  Gen:   Awake, no distress   Resp:  Normal effort  MSK:   Moves extremities without difficulty  Other:  Midline tenderness to palpation of T11-T12 as well as entire lumbar spine.  Medical Decision Making  Medically screening exam initiated at 4:48 PM.  Appropriate orders placed.  EMER GLANDON was informed that the remainder of the evaluation will be completed by another provider, this initial triage assessment does not replace that evaluation, and the importance of remaining in the ED until their evaluation is complete.     Melton Alar R, PA-C 05/07/22 1650

## 2022-05-22 ENCOUNTER — Telehealth: Payer: Self-pay

## 2022-05-22 NOTE — Telephone Encounter (Signed)
Discharge summary faxed back to [facility].

## 2022-06-15 ENCOUNTER — Other Ambulatory Visit: Payer: Self-pay

## 2022-06-15 ENCOUNTER — Encounter: Payer: Self-pay | Admitting: Physician Assistant

## 2022-06-15 ENCOUNTER — Emergency Department (HOSPITAL_COMMUNITY): Payer: Medicare PPO

## 2022-06-15 ENCOUNTER — Emergency Department (HOSPITAL_COMMUNITY)
Admission: EM | Admit: 2022-06-15 | Discharge: 2022-06-15 | Disposition: A | Discharge status: Home / Self Care | Payer: Medicare PPO | Attending: Emergency Medicine | Admitting: Emergency Medicine

## 2022-06-15 ENCOUNTER — Encounter (HOSPITAL_COMMUNITY): Payer: Self-pay | Admitting: *Deleted

## 2022-06-15 DIAGNOSIS — M79605 Pain in left leg: Secondary | ICD-10-CM | POA: Diagnosis not present

## 2022-06-15 DIAGNOSIS — M1612 Unilateral primary osteoarthritis, left hip: Secondary | ICD-10-CM | POA: Diagnosis not present

## 2022-06-15 DIAGNOSIS — M62838 Other muscle spasm: Secondary | ICD-10-CM | POA: Diagnosis not present

## 2022-06-15 LAB — BASIC METABOLIC PANEL WITH GFR
Anion gap: 8 (ref 5–15)
BUN: 15 mg/dL (ref 6–20)
CO2: 28 mmol/L (ref 22–32)
Calcium: 9 mg/dL (ref 8.9–10.3)
Chloride: 103 mmol/L (ref 98–111)
Creatinine, Ser: 0.64 mg/dL (ref 0.44–1.00)
GFR, Estimated: >60 mL/min (ref >60)
Glucose, Bld: 105 mg/dL — ABNORMAL HIGH (ref 70–99)
Potassium: 3.3 mmol/L — ABNORMAL LOW (ref 3.5–5.1)
Sodium: 139 mmol/L (ref 135–145)

## 2022-06-15 LAB — CBC WITH DIFFERENTIAL/PLATELET
Abs Immature Granulocytes: 0.02 K/uL (ref 0.00–0.07)
Basophils Absolute: 0 K/uL (ref 0.0–0.1)
Basophils Relative: 0 %
Eosinophils Absolute: 0 K/uL (ref 0.0–0.5)
Eosinophils Relative: 0 %
HCT: 47.2 % — ABNORMAL HIGH (ref 36.0–46.0)
Hemoglobin: 16.3 g/dL — ABNORMAL HIGH (ref 12.0–15.0)
Immature Granulocytes: 0 %
Lymphocytes Relative: 23 %
Lymphs Abs: 2.1 K/uL (ref 0.7–4.0)
MCH: 32.6 pg (ref 26.0–34.0)
MCHC: 34.5 g/dL (ref 30.0–36.0)
MCV: 94.4 fL (ref 80.0–100.0)
Monocytes Absolute: 0.4 K/uL (ref 0.1–1.0)
Monocytes Relative: 4 %
Neutro Abs: 6.5 K/uL (ref 1.7–7.7)
Neutrophils Relative %: 73 %
Platelets: 166 K/uL (ref 150–400)
RBC: 5 MIL/uL (ref 3.87–5.11)
RDW: 12.7 % (ref 11.5–15.5)
WBC: 9.1 K/uL (ref 4.0–10.5)
nRBC: 0 % (ref 0.0–0.2)

## 2022-06-15 LAB — MAGNESIUM: Magnesium: 1.9 mg/dL (ref 1.7–2.4)

## 2022-06-15 LAB — CK: Total CK: 35 U/L — ABNORMAL LOW (ref 38–234)

## 2022-06-15 MED ORDER — BACLOFEN 10 MG PO TABS: 5.0000 mg | ORAL_TABLET | Freq: Two times a day (BID) | ORAL | 0 refills | Status: DC

## 2022-06-15 MED ORDER — BACLOFEN 10 MG PO TABS
5.0000 mg | ORAL_TABLET | Freq: Once | ORAL | Status: AC
  Administered 2022-06-15: 5 mg via ORAL
  Filled 2022-06-15: qty 1

## 2022-06-15 NOTE — ED Triage Notes (Signed)
Leg pain x 4 days with Left pain that comes and goes as a shooting type pain. Unable to ambulate without assistance. Has history of MS. 148/92-98%-80

## 2022-06-15 NOTE — ED Provider Notes (Signed)
[place] EMERGENCY DEPARTMENT AT [facility] Provider Note   CSN: [id number] Arrival date & time: 06/15/22  1000     History  Chief Complaint  Patient presents with   Leg Pain    Left    Gabriela Campbell is a 57 y.o. female.  With past medical history of multiple sclerosis, hereditary hemochromatosis, leg weakness who presents to the emergency department with left leg pain.  States she is having pain in the left thigh and hip.  Describes the pain as cramping in her thigh or squeezing.  She states that it is a steady pain that does not get better or worse.  She has trouble finding a position of comfort.  She denies having any falls or trauma to the leg.  She denies having any swelling to her joints.  Denies having any fevers.  She has not had any bruising or paleness or coolness to the leg.  She does have chronic tingling in her toes.  With her history of MS she has not had any recent flares.  She uses a walker at all times.   Leg Pain      Home Medications Prior to Admission medications   Medication Sig Start Date End Date Taking? Authorizing Provider  baclofen (LIORESAL) 10 MG tablet Take 0.5 tablets (5 mg total) by mouth 2 (two) times daily. 06/15/22  Yes Cristopher Peru, PA-C  amLODipine (NORVASC) 5 MG tablet Take 1 tablet (5 mg total) by mouth daily. 04/09/22   Derwood Kaplan, MD  Cholecalciferol (VITAMIN D3) 25 MCG (1000 UT) CAPS Take 2,000 Units by mouth daily.    [provider]  DULoxetine (CYMBALTA) 20 MG capsule Take 1 capsule (20 mg total) by mouth daily. 01/22/22   Levert Feinstein, MD  DULoxetine (CYMBALTA) 30 MG capsule Take 1 capsule (30 mg total) by mouth daily. Fill Cymbalta 20mg  for the first month 01/22/22   Levert Feinstein, MD  potassium chloride SA (KLOR-CON M) 20 MEQ tablet Take 1 tablet (20 mEq total) by mouth 2 (two) times daily. 04/09/22   Benjiman Core, MD      Allergies    Dilaudid [hydromorphone hcl], Ibuprofen, Iodine, Penicillins,  Gabapentin, Codeine, and Tramadol    Review of Systems   Review of Systems  Musculoskeletal:  Positive for arthralgias, gait problem and myalgias.  All other systems reviewed and are negative.   Physical Exam Updated Vital Signs BP (!) 145/85 (BP Location: Right Arm)   Pulse 70   Temp 98.1 F (36.7 C) (Oral)   Resp 18   Ht 5\' 2"  (1.575 m)   Wt 38.1 kg   SpO2 97%   BMI 15.36 kg/m  Physical Exam Vitals and nursing note reviewed.  Constitutional:      General: She is not in acute distress.    Appearance: Normal appearance.     Comments: Chronically ill-appearing  HENT:     Head: Normocephalic.  Eyes:     General: No scleral icterus.    Extraocular Movements: Extraocular movements intact.  Cardiovascular:     Rate and Rhythm: Normal rate and regular rhythm.     Pulses: Normal pulses.     Heart sounds: No murmur heard. Pulmonary:     Effort: Pulmonary effort is normal.     Breath sounds: Normal breath sounds.  Abdominal:     General: Bowel sounds are normal.     Palpations: Abdomen is soft.  Musculoskeletal:        General: Tenderness  present. No swelling or deformity.     Left lower leg: No edema.     Comments: Tenderness to palpation of the left thigh and left hip.  There is no obvious swelling of the joints.  No bruising or erythema.  There is no dependent lower extremity edema, paleness, coolness.  DP pulses intact.  Skin:    General: Skin is warm and dry.     Capillary Refill: Capillary refill takes less than 2 seconds.     Findings: No bruising, erythema or rash.  Neurological:     General: No focal deficit present.     Mental Status: She is alert and oriented to person, place, and time. Mental status is at baseline.  Psychiatric:        Mood and Affect: Mood normal.        Behavior: Behavior normal.     ED Results / Procedures / Treatments   Labs (all labs ordered are listed, but only abnormal results are displayed) Labs Reviewed  BASIC METABOLIC PANEL  - Abnormal; Notable for the following components:      Result Value   Potassium 3.3 (*)    Glucose, Bld 105 (*)    All other components within normal limits  CBC WITH DIFFERENTIAL/PLATELET - Abnormal; Notable for the following components:   Hemoglobin 16.3 (*)    HCT 47.2 (*)    All other components within normal limits  CK - Abnormal; Notable for the following components:   Total CK 35 (*)    All other components within normal limits  MAGNESIUM    EKG None  Radiology DG Hip Unilat With Pelvis 2-3 Views Left  Result Date: 06/15/2022 CLINICAL DATA:  Hip pain. Leg pain for 4 days. History of multiple sclerosis. EXAM: DG HIP (WITH OR WITHOUT PELVIS) 2-3V LEFT COMPARISON:  Pelvis and bilateral hip radiographs 09/21/2021 FINDINGS: Normal bone mineralization. Mild pubic symphysis joint space narrowing is similar to prior. The bilateral sacroiliac and bilateral femoroacetabular joint spaces are maintained. Normal morphology of the bilateral femoral head-neck junction without CAM-type bump deformity. No acute fracture or dislocation. Vascular phleboliths overlie the pelvis. IMPRESSION: Minimal pubic symphysis osteoarthritis, similar to prior. No acute fracture. Electronically Signed   By: Neita Garnet M.D.   On: 06/15/2022 12:10    Procedures Procedures   Medications Ordered in ED Medications  baclofen (LIORESAL) tablet 5 mg (5 mg Oral Given 06/15/22 1118)    ED Course/ Medical Decision Making/ A&P   {    Medical Decision Making Amount and/or Complexity of Data Reviewed Labs: ordered. Radiology: ordered.  Risk Prescription drug management.  Initial Impression and Ddx 57 year old female who presents to the emergency department with left leg pain Patient PMH that increases complexity of ED encounter: Multiple sclerosis, hereditary hemochromatosis, leg weakness Differential: Fracture, subluxation, muscle spasm, DVT, arthritis, malignancy, etc.  Interpretation of Diagnostics I  independent reviewed and interpreted the labs as followed: No leukocytosis, BMP without significant electrolyte derangement, CK negative, magnesium within normal limits  - I independently visualized the following imaging with scope of interpretation limited to determining acute life threatening conditions related to emergency care: Plain film of the left pelvis, which revealed no acute findings  Patient Reassessment and Ultimate Disposition/Management 57 year old female who presents to the emergency department with left leg pain.  On my exam she is chronically ill-appearing, older than stated age.  She is having muscle spasm of the left leg.  There is no obvious swelling of the joints or the  leg itself.  There is no bruising or erythema.  There is no deformity.  She has had no trauma or falls.  She also has pain to the left hip.  She is neurovascularly intact.  Obtain an plain film of the left hip and upper leg which does not show any fractures or acute abnormality.  I also obtained a BMP with no significant electrolyte dysfunction.  Her CK is less than 35.  Her magnesium is 1.9.  There is no leukocytosis.  There is no risk factor here at this time for infectious symptoms.  Do not feel that she has a septic joint.  Gave her a dose of baclofen here in the emergency department and her which provided her with some relief.  I am suspicious that she is having muscle spasm related to her.  Multiple sclerosis.  She uses a walker at baseline.  This is not consistent with a radiculopathy or sciatica.  That she has no low back pain or radicular path of the symptoms.  I am going to prescribe her baclofen for home which may help with the symptoms.  We also discussed heat and gentle massage.  She is currently seeing patient [facility] but would like a referral to a different neurologist.  I have sent a ambulatory referral to [facility] neurology practice to have follow-up on the symptoms.  Additionally have given  her strict return precautions if her symptoms worsen.  The patient has been appropriately medically screened and/or stabilized in the ED. I have low suspicion for any other emergent medical condition which would require further screening, evaluation or treatment in the ED or require inpatient management. At time of discharge the patient is hemodynamically stable and in no acute distress. I have discussed work-up results and diagnosis with patient and answered all questions. Patient is agreeable with discharge plan. We discussed strict return precautions for returning to the emergency department and they verbalized understanding.     Patient management required discussion with the following services or consulting groups:  None  Complexity of Problems Addressed Acute complicated illness or Injury  Additional Data Reviewed and Analyzed Further history obtained from: Past medical history and medications listed in the EMR and Care Everywhere  Patient Encounter Risk Assessment Prescriptions and SDOH impact on management  Final Clinical Impression(s) / ED Diagnoses Final diagnoses:  Muscle spasm    Rx / DC Orders ED Discharge Orders          Ordered    Ambulatory referral to Neurology       Comments: An appointment is requested in approximately: 1 week   06/15/22 1319    baclofen (LIORESAL) 10 MG tablet  2 times daily        06/15/22 1320              Cristopher Peru, PA-C 06/15/22 1331    Linwood Dibbles, MD 06/18/22 [phone]

## 2022-06-15 NOTE — Discharge Instructions (Addendum)
You were seen in the emergency department today for left thigh pain.  This is likely muscle spasm and may be related to your multiple sclerosis.  I have sent to her prescription for baclofen which is a muscle relaxant that you can use twice a day for this.  Additionally please use heat for 15 to 20 minutes at a time and gentle massage to help your symptoms.  I have sent a referral to [facility] neurology for you to have follow-up for your multiple sclerosis.  Please return to the emergency department for significantly worsening symptoms.

## 2022-06-30 DIAGNOSIS — M6281 Muscle weakness (generalized): Secondary | ICD-10-CM | POA: Diagnosis not present

## 2022-06-30 DIAGNOSIS — I10 Essential (primary) hypertension: Secondary | ICD-10-CM | POA: Diagnosis not present

## 2022-06-30 DIAGNOSIS — G35 Multiple sclerosis: Secondary | ICD-10-CM | POA: Diagnosis not present

## 2022-06-30 DIAGNOSIS — G8929 Other chronic pain: Secondary | ICD-10-CM | POA: Diagnosis not present

## 2022-07-15 ENCOUNTER — Encounter: Payer: Self-pay | Admitting: Physician Assistant

## 2022-07-28 ENCOUNTER — Other Ambulatory Visit: Payer: Self-pay

## 2022-07-28 ENCOUNTER — Emergency Department (HOSPITAL_COMMUNITY): Payer: Medicare PPO

## 2022-07-28 ENCOUNTER — Inpatient Hospital Stay (HOSPITAL_COMMUNITY)
Admission: EM | Admit: 2022-07-28 | Discharge: 2022-07-30 | DRG: 060 | Disposition: A | Discharge status: Home Health | Payer: Medicare PPO | Attending: Family Medicine | Admitting: Family Medicine

## 2022-07-28 DIAGNOSIS — R932 Abnormal findings on diagnostic imaging of liver and biliary tract: Secondary | ICD-10-CM | POA: Diagnosis not present

## 2022-07-28 DIAGNOSIS — Z5982 Transportation insecurity: Secondary | ICD-10-CM | POA: Diagnosis not present

## 2022-07-28 DIAGNOSIS — M545 Low back pain, unspecified: Secondary | ICD-10-CM | POA: Diagnosis not present

## 2022-07-28 DIAGNOSIS — Z888 Allergy status to other drugs, medicaments and biological substances status: Secondary | ICD-10-CM

## 2022-07-28 DIAGNOSIS — Z91013 Allergy to seafood: Secondary | ICD-10-CM | POA: Diagnosis not present

## 2022-07-28 DIAGNOSIS — G35 Multiple sclerosis: Secondary | ICD-10-CM | POA: Diagnosis not present

## 2022-07-28 DIAGNOSIS — M79606 Pain in leg, unspecified: Secondary | ICD-10-CM | POA: Diagnosis not present

## 2022-07-28 DIAGNOSIS — Z91041 Radiographic dye allergy status: Secondary | ICD-10-CM

## 2022-07-28 DIAGNOSIS — Z87892 Personal history of anaphylaxis: Secondary | ICD-10-CM

## 2022-07-28 DIAGNOSIS — Z880 Allergy status to penicillin: Secondary | ICD-10-CM | POA: Diagnosis not present

## 2022-07-28 DIAGNOSIS — R748 Abnormal levels of other serum enzymes: Secondary | ICD-10-CM | POA: Diagnosis not present

## 2022-07-28 DIAGNOSIS — I10 Essential (primary) hypertension: Secondary | ICD-10-CM | POA: Diagnosis present

## 2022-07-28 DIAGNOSIS — R9082 White matter disease, unspecified: Secondary | ICD-10-CM | POA: Diagnosis not present

## 2022-07-28 DIAGNOSIS — G959 Disease of spinal cord, unspecified: Secondary | ICD-10-CM | POA: Diagnosis not present

## 2022-07-28 DIAGNOSIS — R531 Weakness: Principal | ICD-10-CM

## 2022-07-28 DIAGNOSIS — D751 Secondary polycythemia: Secondary | ICD-10-CM | POA: Diagnosis present

## 2022-07-28 DIAGNOSIS — R7989 Other specified abnormal findings of blood chemistry: Secondary | ICD-10-CM | POA: Diagnosis not present

## 2022-07-28 DIAGNOSIS — Z885 Allergy status to narcotic agent status: Secondary | ICD-10-CM | POA: Diagnosis not present

## 2022-07-28 DIAGNOSIS — Z9049 Acquired absence of other specified parts of digestive tract: Secondary | ICD-10-CM | POA: Diagnosis not present

## 2022-07-28 DIAGNOSIS — M79604 Pain in right leg: Secondary | ICD-10-CM | POA: Diagnosis present

## 2022-07-28 DIAGNOSIS — F17210 Nicotine dependence, cigarettes, uncomplicated: Secondary | ICD-10-CM | POA: Diagnosis present

## 2022-07-28 DIAGNOSIS — Z886 Allergy status to analgesic agent status: Secondary | ICD-10-CM

## 2022-07-28 DIAGNOSIS — R32 Unspecified urinary incontinence: Secondary | ICD-10-CM | POA: Diagnosis present

## 2022-07-28 DIAGNOSIS — G8929 Other chronic pain: Secondary | ICD-10-CM | POA: Diagnosis present

## 2022-07-28 DIAGNOSIS — F419 Anxiety disorder, unspecified: Secondary | ICD-10-CM | POA: Diagnosis present

## 2022-07-28 DIAGNOSIS — K739 Chronic hepatitis, unspecified: Secondary | ICD-10-CM | POA: Diagnosis not present

## 2022-07-28 DIAGNOSIS — Z79899 Other long term (current) drug therapy: Secondary | ICD-10-CM | POA: Diagnosis not present

## 2022-07-28 DIAGNOSIS — R29898 Other symptoms and signs involving the musculoskeletal system: Secondary | ICD-10-CM | POA: Diagnosis not present

## 2022-07-28 DIAGNOSIS — F32A Depression, unspecified: Secondary | ICD-10-CM | POA: Diagnosis present

## 2022-07-28 DIAGNOSIS — M549 Dorsalgia, unspecified: Secondary | ICD-10-CM | POA: Diagnosis not present

## 2022-07-28 DIAGNOSIS — E876 Hypokalemia: Secondary | ICD-10-CM | POA: Diagnosis not present

## 2022-07-28 DIAGNOSIS — M79605 Pain in left leg: Secondary | ICD-10-CM | POA: Diagnosis present

## 2022-07-28 DIAGNOSIS — Z90710 Acquired absence of both cervix and uterus: Secondary | ICD-10-CM

## 2022-07-28 DIAGNOSIS — R29818 Other symptoms and signs involving the nervous system: Secondary | ICD-10-CM | POA: Diagnosis not present

## 2022-07-28 DIAGNOSIS — K7689 Other specified diseases of liver: Secondary | ICD-10-CM | POA: Diagnosis not present

## 2022-07-28 LAB — CBC WITH DIFFERENTIAL/PLATELET
Abs Immature Granulocytes: 0.02 10*3/uL (ref 0.00–0.07)
Basophils Absolute: 0.1 10*3/uL (ref 0.0–0.1)
Basophils Relative: 1 %
Eosinophils Absolute: 0 10*3/uL (ref 0.0–0.5)
Eosinophils Relative: 0 %
HCT: 49.9 % — ABNORMAL HIGH (ref 36.0–46.0)
Hemoglobin: 17 g/dL — ABNORMAL HIGH (ref 12.0–15.0)
Immature Granulocytes: 0 %
Lymphocytes Relative: 17 %
Lymphs Abs: 1.9 10*3/uL (ref 0.7–4.0)
MCH: 31.8 pg (ref 26.0–34.0)
MCHC: 34.1 g/dL (ref 30.0–36.0)
MCV: 93.4 fL (ref 80.0–100.0)
Monocytes Absolute: 0.4 10*3/uL (ref 0.1–1.0)
Monocytes Relative: 4 %
Neutro Abs: 8.7 10*3/uL — ABNORMAL HIGH (ref 1.7–7.7)
Neutrophils Relative %: 78 %
Platelets: 168 10*3/uL (ref 150–400)
RBC: 5.34 MIL/uL — ABNORMAL HIGH (ref 3.87–5.11)
RDW: 13.2 % (ref 11.5–15.5)
WBC: 11.1 10*3/uL — ABNORMAL HIGH (ref 4.0–10.5)
nRBC: 0 % (ref 0.0–0.2)

## 2022-07-28 LAB — COMPREHENSIVE METABOLIC PANEL
ALT: 169 U/L — ABNORMAL HIGH (ref 0–44)
AST: 65 U/L — ABNORMAL HIGH (ref 15–41)
Albumin: 4.3 g/dL (ref 3.5–5.0)
Alkaline Phosphatase: 644 U/L — ABNORMAL HIGH (ref 38–126)
Anion gap: 12 (ref 5–15)
BUN: 12 mg/dL (ref 6–20)
CO2: 27 mmol/L (ref 22–32)
Calcium: 9.7 mg/dL (ref 8.9–10.3)
Chloride: 100 mmol/L (ref 98–111)
Creatinine, Ser: 0.76 mg/dL (ref 0.44–1.00)
GFR, Estimated: >60 mL/min (ref >60)
Glucose, Bld: 99 mg/dL (ref 70–99)
Potassium: 3.3 mmol/L — ABNORMAL LOW (ref 3.5–5.1)
Sodium: 139 mmol/L (ref 135–145)
Total Bilirubin: 0.7 mg/dL (ref 0.3–1.2)
Total Protein: 8.2 g/dL — ABNORMAL HIGH (ref 6.5–8.1)

## 2022-07-28 LAB — I-STAT CHEM 8, ED
BUN: 11 mg/dL (ref 6–20)
Calcium, Ion: 1.18 mmol/L (ref 1.15–1.40)
Chloride: 101 mmol/L (ref 98–111)
Creatinine, Ser: 0.7 mg/dL (ref 0.44–1.00)
Glucose, Bld: 93 mg/dL (ref 70–99)
HCT: 52 % — ABNORMAL HIGH (ref 36.0–46.0)
Hemoglobin: 17.7 g/dL — ABNORMAL HIGH (ref 12.0–15.0)
Potassium: 3.2 mmol/L — ABNORMAL LOW (ref 3.5–5.1)
Sodium: 141 mmol/L (ref 135–145)
TCO2: 28 mmol/L (ref 22–32)

## 2022-07-28 LAB — URINALYSIS, ROUTINE W REFLEX MICROSCOPIC
Bilirubin Urine: NEGATIVE
Glucose, UA: NEGATIVE mg/dL
Ketones, ur: NEGATIVE mg/dL
Nitrite: NEGATIVE
Protein, ur: NEGATIVE mg/dL
Specific Gravity, Urine: 1.013 (ref 1.005–1.030)
pH: 5 (ref 5.0–8.0)

## 2022-07-28 MED ORDER — ENOXAPARIN SODIUM 30 MG/0.3ML IJ SOSY
30.0000 mg | PREFILLED_SYRINGE | INTRAMUSCULAR | Status: DC
  Administered 2022-07-29 – 2022-07-30 (×2): 30 mg via SUBCUTANEOUS
  Filled 2022-07-28 (×2): qty 0.3

## 2022-07-28 MED ORDER — HYDRALAZINE HCL 20 MG/ML IJ SOLN: 10.0000 mg | Freq: Four times a day (QID) | INTRAMUSCULAR | Status: DC | PRN

## 2022-07-28 MED ORDER — POTASSIUM CHLORIDE CRYS ER 20 MEQ PO TBCR
40.0000 meq | EXTENDED_RELEASE_TABLET | Freq: Once | ORAL | Status: AC
  Administered 2022-07-28: 40 meq via ORAL
  Filled 2022-07-28: qty 2

## 2022-07-28 MED ORDER — LORAZEPAM 2 MG/ML IJ SOLN
2.0000 mg | Freq: Once | INTRAMUSCULAR | Status: AC | PRN
  Administered 2022-07-28: 2 mg via INTRAVENOUS
  Filled 2022-07-28: qty 1

## 2022-07-28 MED ORDER — MORPHINE SULFATE (PF) 4 MG/ML IV SOLN
4.0000 mg | Freq: Once | INTRAVENOUS | Status: AC
  Administered 2022-07-28: 4 mg via INTRAVENOUS
  Filled 2022-07-28: qty 1

## 2022-07-28 MED ORDER — OXYCODONE-ACETAMINOPHEN 5-325 MG PO TABS
2.0000 | ORAL_TABLET | Freq: Four times a day (QID) | ORAL | Status: DC | PRN
  Administered 2022-07-29 (×2): 2 via ORAL
  Filled 2022-07-28 (×3): qty 2

## 2022-07-28 MED ORDER — ACETAMINOPHEN 650 MG RE SUPP: 650.0000 mg | Freq: Four times a day (QID) | RECTAL | Status: DC | PRN

## 2022-07-28 MED ORDER — ONDANSETRON HCL 4 MG PO TABS: 4.0000 mg | ORAL_TABLET | Freq: Four times a day (QID) | ORAL | Status: DC | PRN

## 2022-07-28 MED ORDER — ONDANSETRON HCL 4 MG/2ML IJ SOLN
4.0000 mg | Freq: Once | INTRAMUSCULAR | Status: AC
  Administered 2022-07-28: 4 mg via INTRAVENOUS
  Filled 2022-07-28: qty 2

## 2022-07-28 MED ORDER — VITAMIN D 25 MCG (1000 UNIT) PO TABS
2000.0000 [IU] | ORAL_TABLET | Freq: Every day | ORAL | Status: DC
  Administered 2022-07-29 – 2022-07-30 (×2): 2000 [IU] via ORAL
  Filled 2022-07-28 (×2): qty 2

## 2022-07-28 MED ORDER — AMLODIPINE BESYLATE 5 MG PO TABS
5.0000 mg | ORAL_TABLET | Freq: Every day | ORAL | Status: DC
  Administered 2022-07-30: 5 mg via ORAL
  Filled 2022-07-28 (×2): qty 1

## 2022-07-28 MED ORDER — LACTATED RINGERS IV SOLN: INTRAVENOUS | Status: AC

## 2022-07-28 MED ORDER — NICOTINE 21 MG/24HR TD PT24
21.0000 mg | MEDICATED_PATCH | Freq: Every day | TRANSDERMAL | Status: DC
  Administered 2022-07-30: 21 mg via TRANSDERMAL
  Filled 2022-07-28 (×2): qty 1

## 2022-07-28 MED ORDER — OXYCODONE-ACETAMINOPHEN 5-325 MG PO TABS: 1.0000 | ORAL_TABLET | Freq: Four times a day (QID) | ORAL | Status: DC | PRN

## 2022-07-28 MED ORDER — POLYETHYLENE GLYCOL 3350 17 G PO PACK: 17.0000 g | PACK | Freq: Every day | ORAL | Status: DC | PRN

## 2022-07-28 MED ORDER — GADOBUTROL 1 MMOL/ML IV SOLN
4.0000 mL | Freq: Once | INTRAVENOUS | Status: AC | PRN
  Administered 2022-07-28: 4 mL via INTRAVENOUS

## 2022-07-28 MED ORDER — ONDANSETRON HCL 4 MG/2ML IJ SOLN: 4.0000 mg | Freq: Four times a day (QID) | INTRAMUSCULAR | Status: DC | PRN

## 2022-07-28 MED ORDER — ACETAMINOPHEN 325 MG PO TABS
650.0000 mg | ORAL_TABLET | Freq: Four times a day (QID) | ORAL | Status: DC | PRN
  Administered 2022-07-30: 650 mg via ORAL
  Filled 2022-07-28: qty 2

## 2022-07-28 NOTE — ED Provider Notes (Signed)
[place] EMERGENCY DEPARTMENT AT [facility] Provider Note   CSN: [id number] Arrival date & time: 07/28/22  1407     History  Chief Complaint  Patient presents with   Leg Pain    Bilateral    Gabriela Campbell is a 57 y.o. female history of hypertension, multiple sclerosis, here presenting with worsening weakness.  Patient states that she has progressively worsening weakness for the last 2 weeks.  Patient states that she has been falling due to pain.  She states that she also has worsening lower back pain.  She also states that she has not been eating or drinking much.  Patient follows with Dr. Terrace Arabia from neurology.  Patient states that she is not on any medications right now for her multiple sclerosis.  She states that she does not remember the last time she was admitted for high-dose steroids.  Denies any fevers.  The history is provided by the patient.       Home Medications Prior to Admission medications   Medication Sig Start Date End Date Taking? Authorizing Provider  amLODipine (NORVASC) 5 MG tablet Take 1 tablet (5 mg total) by mouth daily. 04/09/22   Derwood Kaplan, MD  baclofen (LIORESAL) 10 MG tablet Take 0.5 tablets (5 mg total) by mouth 2 (two) times daily. 06/15/22   Cristopher Peru, PA-C  Cholecalciferol (VITAMIN D3) 25 MCG (1000 UT) CAPS Take 2,000 Units by mouth daily.    [provider]  DULoxetine (CYMBALTA) 20 MG capsule Take 1 capsule (20 mg total) by mouth daily. 01/22/22   Levert Feinstein, MD  DULoxetine (CYMBALTA) 30 MG capsule Take 1 capsule (30 mg total) by mouth daily. Fill Cymbalta 20mg  for the first month 01/22/22   Levert Feinstein, MD  potassium chloride SA (KLOR-CON M) 20 MEQ tablet Take 1 tablet (20 mEq total) by mouth 2 (two) times daily. 04/09/22   Benjiman Core, MD      Allergies    Dilaudid [hydromorphone hcl], Ibuprofen, Iodine, Penicillins, Gabapentin, Codeine, and Tramadol    Review of Systems   Review of Systems  Musculoskeletal:   Positive for back pain.  Neurological:  Positive for weakness.  All other systems reviewed and are negative.   Physical Exam Updated Vital Signs BP (!) 131/120 (BP Location: Left Arm)   Pulse 90   Temp 97.7 F (36.5 C) (Oral)   Resp 18   Ht 5\' 2"  (1.575 m)   Wt 39 kg   SpO2 97%   BMI 15.73 kg/m  Physical Exam Vitals and nursing note reviewed.  Constitutional:      Comments: Chronically ill and dehydrated  HENT:     Head: Normocephalic.     Nose: Nose normal.     Mouth/Throat:     Mouth: Mucous membranes are dry.  Eyes:     Extraocular Movements: Extraocular movements intact.     Pupils: Pupils are equal, round, and reactive to light.  Cardiovascular:     Rate and Rhythm: Normal rate and regular rhythm.     Pulses: Normal pulses.     Heart sounds: Normal heart sounds.  Pulmonary:     Effort: Pulmonary effort is normal.     Breath sounds: Normal breath sounds.  Abdominal:     General: Abdomen is flat.     Palpations: Abdomen is soft.  Musculoskeletal:        General: Normal range of motion.     Cervical back: Normal range of motion and neck  supple.     Comments: Patient has lower lumbar and also thoracic tenderness  Skin:    General: Skin is warm.  Neurological:     Comments: Patient has 3 out of 5 strength bilateral legs.  Patient has normal reflexes bilateral knees.  Patient is unable to stand on her own.  Patient required 2 people assistance to walk  Psychiatric:        Mood and Affect: Mood normal.        Behavior: Behavior normal.     ED Results / Procedures / Treatments   Labs (all labs ordered are listed, but only abnormal results are displayed) Labs Reviewed  CBC WITH DIFFERENTIAL/PLATELET - Abnormal; Notable for the following components:      Result Value   WBC 11.1 (*)    RBC 5.34 (*)    Hemoglobin 17.0 (*)    HCT 49.9 (*)    Neutro Abs 8.7 (*)    All other components within normal limits  COMPREHENSIVE METABOLIC PANEL - Abnormal; Notable for  the following components:   Potassium 3.3 (*)    Total Protein 8.2 (*)    AST 65 (*)    ALT 169 (*)    Alkaline Phosphatase 644 (*)    All other components within normal limits  I-STAT CHEM 8, ED - Abnormal; Notable for the following components:   Potassium 3.2 (*)    Hemoglobin 17.7 (*)    HCT 52.0 (*)    All other components within normal limits  URINALYSIS, ROUTINE W REFLEX MICROSCOPIC    EKG None  Radiology No results found.  Procedures Procedures    Medications Ordered in ED Medications  gadobutrol (GADAVIST) 1 MMOL/ML injection 4 mL (has no administration in time range)  morphine (PF) 4 MG/ML injection 4 mg (4 mg Intravenous Given 07/28/22 1638)  ondansetron (ZOFRAN) injection 4 mg (4 mg Intravenous Given 07/28/22 1638)  LORazepam (ATIVAN) injection 2 mg (2 mg Intravenous Given 07/28/22 1816)    ED Course/ Medical Decision Making/ A&P                             Medical Decision Making Gabriela Campbell is a 57 y.o. female here presenting with bilateral leg weakness.  Patient has history of multiple sclerosis.  Patient is unable to bear weight by herself and this is new.  I am concerned for possible MS flare.  Will get MRI brain and also MRI of her spine.  Will hydrate and reassess  9:06 PM MRI showed no active demyelination.  Patient's labs significant for elevated LFTs.  Patient is in the process of getting referral to hepatology.  Patient is still weak and unable to even get up.  I discussed case with Dr. Iver Nestle from neurology.  She states that this patient does not need treatment for MS and can stay at [facility] long and will need PT and OT.   Problems Addressed: Elevated LFTs: acute illness or injury Weakness: acute illness or injury  Amount and/or Complexity of Data Reviewed Labs: ordered. Decision-making details documented in ED Course. Radiology: ordered and independent interpretation performed. Decision-making details documented in ED  Course.  Risk Prescription drug management.    Final Clinical Impression(s) / ED Diagnoses Final diagnoses:  None    Rx / DC Orders ED Discharge Orders     None         Charlynne Pander, MD 07/28/22 2109

## 2022-07-28 NOTE — H&P (Addendum)
History and Physical    [name] MRN: [id number] DOA: 07/28/2022  Date of Service: the patient was seen and examined on 07/28/2022  Patient coming from: Home via EMS  Chief Complaint:  Chief Complaint  Patient presents with   Leg Pain    Bilateral    HPI:   57 year old female with past medical history of relapsing remitting multiple sclerosis (follows with Dr. Terrace Arabia with [facility] Neurology) not currently on therapy due to ongoing abnormal liver function tests since 2021, hereditary hemochromatosis, hypertension, depression and anxiety disorder who presents to [facility] emergency department via EMS due to complaints of worsening bilateral lower extremity pain.  An excerpt of Neurology's summarizing her liver workup thus far:  She had extensive GI evaluation by Dr. Adela Lank for abnormal liver function, which has been ongoing since 2019.  She had ultrasound in May 2022 was normal, serology work-up with iron saturation of 56%, otherwise negative for clear etiology, hemochromatosis genetic testing revealed compound heterozygote for H63D and C282Y genes, ferritin has been between 200-500 range, liver biopsy June 2022 showed chronic hepatitis with portal inflammation with ductular reaction, moderate fibrosis, differentiation is broad but clear-cut etiology was not found, staining was not consistent with typical hemochromatosis, was also seen by hematologist for evaluation, do not think that hemochromatosis is causing of her liver disease.  Magnetic resonance cholangio pancreatography, on May 19, 2021 showed prominence of the extra and center intrahepatic biliary tree with the common duct measuring 8 mm but with gentle tapering to the level of the ampulla, dilation of the duct is similar to prior ultrasound, failure reservoir effect post cholecystectomy, no hepatic imaging finding to suggest cyanosis, no suspicious hepatic lesion.  She was referred to [facility] hepatologist for  evaluation evaluation, but never carried through due to lack of transportation.  Patient reports that for the past 2 weeks patient has been experiencing severe low back pain.  This pain is sharp in quality and severe in intensity and worse with movement.  This has been associated with progressively worsening bilateral lower extremity weakness and difficulty with ambulation.  Patient states that her pain is so bad that she is unable to eat or drink much.  Patient denies any associated fever, dysuria or GI distress.  Patient denies any recent trauma.  Due to progressively worsening symptoms EMS was contacted and patient was brought in to [facility] emergency department for evaluation.  Upon evaluation in the emergency department due to patient's severe symptoms MRI of the brain with and without contrast in addition to MRI of the lumbar cervical and thoracic spine with and without contrast were performed.  While there was a lot of motion artifact on these images there is no obvious evidence of a acute demyelinating lesions.  Throughout the course of the emergency department workup the patient began to also experience episodes of urinary incontinence.  Case was discussed with Dr. Iver Nestle with neurology and in light of the patient's progressive symptoms recommendation was for the patient to be admitted to [facility] for continued evaluation and in person neurology consultation.  The hospitalist group was then called to assess the patient for admission to the hospital.  Review of Systems: Review of Systems  Musculoskeletal:  Positive for back pain.  Neurological:  Positive for weakness.  All other systems reviewed and are negative.    Past Medical History:  Diagnosis Date   Allergy    Multiple sclerosis (HCC)    Shingles  Weakness     Past Surgical History:  Procedure Laterality Date   ABDOMINAL HYSTERECTOMY     CHOLECYSTECTOMY      Social History:  reports that she has  been smoking cigarettes. She has never used smokeless tobacco. She reports that she does not drink alcohol and does not use drugs.  Allergies  Allergen Reactions   Dilaudid [Hydromorphone Hcl] Anaphylaxis   Fish-Derived Products Anaphylaxis   Ibuprofen Anaphylaxis   Iodine Anaphylaxis   Penicillins Anaphylaxis    Has patient had a PCN reaction causing immediate rash, facial/tongue/throat swelling, SOB or lightheadedness with hypotension:yes Has patient had a PCN reaction causing severe rash involving mucus membranes or skin necrosis: no Has patient had a PCN reaction that required hospitalization no Has patient had a PCN reaction occurring within the last 10 years: no If all of the above answers are "NO", then may proceed with Cephalosporin use.    Shellfish-Derived Products Anaphylaxis   Shrimp Extract Anaphylaxis   Baclofen Other (See Comments)    Makes the patient feel wobbly   Gabapentin Other (See Comments)    GI upset   Codeine Nausea Only   Tramadol Nausea And Vomiting    Family History  Problem Relation Age of Onset   Cancer Paternal Grandmother    Cancer Paternal Aunt    Cancer Paternal Aunt    Colon cancer Neg Hx    Stomach cancer Neg Hx    Esophageal cancer Neg Hx    Pancreatic cancer Neg Hx     Prior to Admission medications   Medication Sig Start Date End Date Taking? Authorizing Provider  amLODipine (NORVASC) 5 MG tablet Take 1 tablet (5 mg total) by mouth daily. 04/09/22  Yes Derwood Kaplan, MD  Cholecalciferol (VITAMIN D3) 25 MCG (1000 UT) CAPS Take 2,000 Units by mouth daily.   Yes [provider]  POTASSIUM PO Take 1 tablet by mouth daily.   Yes [provider]  baclofen (LIORESAL) 10 MG tablet Take 0.5 tablets (5 mg total) by mouth 2 (two) times daily. Patient not taking: Reported on 07/28/2022 06/15/22   Cristopher Peru, PA-C  DULoxetine (CYMBALTA) 20 MG capsule Take 1 capsule (20 mg total) by mouth daily. Patient not taking: Reported on  07/28/2022 01/22/22   Levert Feinstein, MD  DULoxetine (CYMBALTA) 30 MG capsule Take 1 capsule (30 mg total) by mouth daily. Fill Cymbalta 20mg  for the first month Patient not taking: Reported on 07/28/2022 01/22/22   Levert Feinstein, MD  potassium chloride SA (KLOR-CON M) 20 MEQ tablet Take 1 tablet (20 mEq total) by mouth 2 (two) times daily. Patient not taking: Reported on 07/28/2022 04/09/22   Benjiman Core, MD    Physical Exam:  Vitals:   07/28/22 1415 07/28/22 1416 07/28/22 1420 07/28/22 2017  BP:  (!) 131/120  132/85  Pulse:  90  79  Resp:  18  16  Temp:  97.7 F (36.5 C)  98.1 F (36.7 C)  TempSrc:  Oral  Oral  SpO2: 98% 97%  100%  Weight:   39 kg   Height:   5\' 2"  (1.575 m)     Constitutional: Awake alert and oriented x3, patient is in distress due to back pain. Skin: no rashes, no lesions, poor skin turgor noted. Eyes: Pupils are equally reactive to light.  No evidence of scleral icterus or conjunctival pallor.  ENMT: Slightly dry mucous membranes noted.  Posterior pharynx clear of any exudate or lesions.   Neck: normal, supple,  no masses, no thyromegaly.  No evidence of jugular venous distension.   Respiratory: clear to auscultation bilaterally, no wheezing, no crackles. Normal respiratory effort. No accessory muscle use.  Cardiovascular: Regular rate and rhythm, no murmurs / rubs / gallops. No extremity edema. 2+ pedal pulses. No carotid bruits.  Chest:   Nontender without crepitus or deformity.   Back:   Diffuse lumbar tenderness without any evidence of crepitus or deformity on palpation. Abdomen: Abdomen is soft and nontender.  No evidence of intra-abdominal masses.  Positive bowel sounds noted in all quadrants.   Musculoskeletal: No joint deformity upper and lower extremities. Good ROM, no contractures.  Poor tone noted. Neurologic: Notably generalized weakness, approximately 4 out of 5 in strength all extremities in the proximal and distal muscle groups.  CN 2-12 grossly intact.  Sensation intact although patient does complain of generalized numbness of the distal bilateral lower extremities in a stocking-like distribution. Patient is following all commands.  Patient is responsive to verbal stimuli.   Psychiatric: Patient exhibits normal mood with flat affect.  Patient seems to possess insight as to their current situation.    Data Reviewed:  I have personally reviewed and interpreted labs, imaging.  Significant findings are   Lab Results  Component Value Date   WBC 11.1 (H) 07/28/2022   HGB 17.7 (H) 07/28/2022   HCT 52.0 (H) 07/28/2022   MCV 93.4 07/28/2022   PLT 168 07/28/2022   Lab Results  Component Value Date   K 3.2 (L) 07/28/2022   Lab Results  Component Value Date   BUN 11 07/28/2022   Lab Results  Component Value Date   CREATININE 0.70 07/28/2022    MRI brain with and without contrast: Unchanged distributional white matter lesions consistent with multiple sclerosis.  No evidence of active demyelination.    Assessment and Plan: * Bilateral leg weakness Patient reports progressively worsening bilateral lower extremity weakness for approximately 1 month associated with severe low back pain and bilateral lower extremity pain. No evidence of physical deformity on exam, MRI of the spine reveals no new demyelinating lesions and no significant injury/deformity of the vertebra that would compress the spinal cord. Considering patient's clinical symptoms and new onset of urinary incontinence per my discussion with Dr. Ewing Schlein with neurology who recommends hospitalization at [facility] for formal neurology consultation and determination as to whether plasma exchange is warranted. No steroids will be initiated at this time due to risks associated with patient's known history of liver disease Obtaining vitamin B12, urinalysis, creatine kinase and TSH  Severe low back pain Patient reports severe low back pain over approximate the same period of  time as the weakness See assessment and plan above, etiology unclear As needed opiate-based analgesics for now, limiting use of intravenous opiates if possible and will attempt to wean oral opiates in the coming days as able PT evaluation  Relapsing remitting multiple sclerosis (HCC) Follows with Dr. Terrace Arabia with [facility] neurology Not on immunomodulating therapy since 2021 due to elevated liver enzymes Unclear as to whether progressively worsening weakness and pain is related to multiple sclerosis. Appreciate neurology's recommendations at [facility]  Elevated liver enzymes Longstanding elevated liver enzymes since 2021 Patient has undergone extensive workup detailed in italics in HPI including liver biopsy 06/2020 and MRCP 05/2021. Patient follows with Dr. Adela Lank with [facility] gastroenterology Patient was ultimately referred to [facility] hepatology but was never able to make it there due to transportation issues. While I do not believe patient's elevated liver  enzymes at this time are directly linked to the patient's current need for hospitalization we will obtain a creatine kinase and send a secure chat to gastroenterology to request their input on this case via consultation.  Their input is appreciated.  Essential hypertension Continue home regimen of amlodipine As needed intravenous hydralazine for markedly elevated blood pressure  Hypokalemia Replacing with oral potassium chloride  Polycythemia Notably worsening polycythemia as of late Patient has a longstanding history of smoking which is the likely culprit Possibly some superimposed element of dehydration as well Will hydrate gently, monitor hemoglobin and hematocrit with serial CBCs  Hereditary hemochromatosis (HCC) Patient has been evaluated by oncology in the past.  At that time they did not feel that the patient's hemochromatosis was contributing to her liver disease. Obtaining repeat ferritin level.   Code Status:  Full code   code status decision has been confirmed with: patient Family Communication: none   Consults: Dr. Iver Nestle with Neurology.  Secure chat message sent to [facility] gastroenterology  Severity of Illness:  The appropriate patient status for this patient is INPATIENT. Inpatient status is judged to be reasonable and necessary in order to provide the required intensity of service to ensure the patient's safety. The patient's presenting symptoms, physical exam findings, and initial radiographic and laboratory data in the context of their chronic comorbidities is felt to place them at high risk for further clinical deterioration. Furthermore, it is not anticipated that the patient will be medically stable for discharge from the hospital within 2 midnights of admission.   * I certify that at the point of admission it is my clinical judgment that the patient will require inpatient hospital care spanning beyond 2 midnights from the point of admission due to high intensity of service, high risk for further deterioration and high frequency of surveillance required.*  Author:  Marinda Elk MD  07/28/2022 11:55 PM

## 2022-07-28 NOTE — ED Notes (Signed)
Per dayshift RN, pt is in MRI at this time

## 2022-07-28 NOTE — Assessment & Plan Note (Signed)
   Replacing with oral potassium chloride

## 2022-07-28 NOTE — Assessment & Plan Note (Signed)
Patient has been evaluated by oncology in the past.  At that time they did not feel that the patient's hemochromatosis was contributing to her liver disease. Obtaining repeat ferritin level.

## 2022-07-28 NOTE — Assessment & Plan Note (Signed)
Follows with Dr. Terrace Arabia with [facility] neurology Not on immunomodulating therapy since 2021 due to elevated liver enzymes Unclear as to whether progressively worsening weakness and pain is related to multiple sclerosis. Appreciate neurology's recommendations at [facility]

## 2022-07-28 NOTE — ED Notes (Signed)
Pt has had two episodes of incontinence since 1900. Pt returned from MRI and stated she had urinated during her scan. Pt was incontinent again at 2115. Patient cleaned up and bed changed by this RN. Pt states she normally knows when she needs to go to the bathroom.

## 2022-07-28 NOTE — Assessment & Plan Note (Signed)
Longstanding elevated liver enzymes since 2021 Patient has undergone extensive workup detailed in italics in HPI including liver biopsy 06/2020 and MRCP 05/2021. Patient follows with Dr. Adela Lank with [facility] gastroenterology Patient was ultimately referred to [facility] hepatology but was never able to make it there due to transportation issues. While I do not believe patient's elevated liver enzymes at this time are directly linked to the patient's current need for hospitalization we will obtain a creatine kinase and send a secure chat to gastroenterology to request their input on this case via consultation.  Their input is appreciated.

## 2022-07-28 NOTE — ED Notes (Signed)
ED TO INPATIENT HANDOFF REPORT  Name/Age/Gender Gabriela Campbell 57 y.o. female  Code Status   Home/SNF/Other Rehab  Chief Complaint Bilateral leg weakness [R29.898]  Level of Care/Admitting Diagnosis ED Disposition     ED Disposition  Admit   Condition  --   Comment  Hospital Area: [facility] [[id number]]  Level of Care: Telemetry [5]  Admit to tele based on following criteria: Monitor for Ischemic changes  May place patient in observation at [facility] or [facility] if equivalent level of care is available:: No  Covid Evaluation: Asymptomatic - no recent exposure (last 10 days) testing not required  Diagnosis: Bilateral leg weakness [409811]  Admitting Physician: Marinda Elk [[id number]]  Attending Physician: Marinda Elk [[id number]]          Medical History Past Medical History:  Diagnosis Date   Allergy    Multiple sclerosis (HCC)    Shingles    Weakness     Allergies Allergies  Allergen Reactions   Dilaudid [Hydromorphone Hcl] Anaphylaxis   Ibuprofen Anaphylaxis   Iodine Anaphylaxis   Penicillins Anaphylaxis    Has patient had a PCN reaction causing immediate rash, facial/tongue/throat swelling, SOB or lightheadedness with hypotension:yes Has patient had a PCN reaction causing severe rash involving mucus membranes or skin necrosis: no Has patient had a PCN reaction that required hospitalization no Has patient had a PCN reaction occurring within the last 10 years: no If all of the above answers are "NO", then may proceed with Cephalosporin use.    Gabapentin Other (See Comments)    GI upset   Codeine Nausea Only   Tramadol Nausea And Vomiting    IV Location/Drains/Wounds Patient Lines/Drains/Airways Status     Active Line/Drains/Airways     Name Placement date Placement time Site Days   Peripheral IV 07/28/22 20 G Anterior;Left;Proximal Forearm 07/28/22  1637  Forearm  less than 1             Labs/Imaging Results for orders placed or performed during the hospital encounter of 07/28/22 (from the past 48 hour(s))  Urinalysis, Routine w reflex microscopic -Urine, Clean Catch     Status: Abnormal   Collection Time: 07/28/22  4:15 PM  Result Value Ref Range   Color, Urine YELLOW YELLOW   APPearance HAZY (A) CLEAR   Specific Gravity, Urine 1.013 1.005 - 1.030   pH 5.0 5.0 - 8.0   Glucose, UA NEGATIVE NEGATIVE mg/dL   Hgb urine dipstick SMALL (A) NEGATIVE   Bilirubin Urine NEGATIVE NEGATIVE   Ketones, ur NEGATIVE NEGATIVE mg/dL   Protein, ur NEGATIVE NEGATIVE mg/dL   Nitrite NEGATIVE NEGATIVE   Leukocytes,Ua TRACE (A) NEGATIVE   RBC / HPF 0-5 0 - 5 RBC/hpf   WBC, UA 11-20 0 - 5 WBC/hpf   Bacteria, UA RARE (A) NONE SEEN   Squamous Epithelial / HPF 0-5 0 - 5 /HPF   Mucus PRESENT     Comment: Performed at [facility], [address] [place], [place] [address]  CBC with Differential     Status: Abnormal   Collection Time: 07/28/22  4:39 PM  Result Value Ref Range   WBC 11.1 (H) 4.0 - 10.5 K/uL   RBC 5.34 (H) 3.87 - 5.11 MIL/uL   Hemoglobin 17.0 (H) 12.0 - 15.0 g/dL   HCT 69.6 (H) 29.5 - 28.4 %   MCV 93.4 80.0 - 100.0 fL   MCH 31.8 26.0 - 34.0 pg   MCHC 34.1  30.0 - 36.0 g/dL   RDW 47.4 25.9 - 56.3 %   Platelets 168 150 - 400 K/uL   nRBC 0.0 0.0 - 0.2 %   Neutrophils Relative % 78 %   Neutro Abs 8.7 (H) 1.7 - 7.7 K/uL   Lymphocytes Relative 17 %   Lymphs Abs 1.9 0.7 - 4.0 K/uL   Monocytes Relative 4 %   Monocytes Absolute 0.4 0.1 - 1.0 K/uL   Eosinophils Relative 0 %   Eosinophils Absolute 0.0 0.0 - 0.5 K/uL   Basophils Relative 1 %   Basophils Absolute 0.1 0.0 - 0.1 K/uL   Immature Granulocytes 0 %   Abs Immature Granulocytes 0.02 0.00 - 0.07 K/uL    Comment: Performed at [facility], [address], [place], [place] [address]  Comprehensive metabolic panel     Status: Abnormal   Collection Time: 07/28/22  4:39 PM   Result Value Ref Range   Sodium 139 135 - 145 mmol/L   Potassium 3.3 (L) 3.5 - 5.1 mmol/L   Chloride 100 98 - 111 mmol/L   CO2 27 22 - 32 mmol/L   Glucose, Bld 99 70 - 99 mg/dL    Comment: Glucose reference range applies only to samples taken after fasting for at least 8 hours.   BUN 12 6 - 20 mg/dL   Creatinine, Ser 3.32 0.44 - 1.00 mg/dL   Calcium 9.7 8.9 - 95.1 mg/dL   Total Protein 8.2 (H) 6.5 - 8.1 g/dL   Albumin 4.3 3.5 - 5.0 g/dL   AST 65 (H) 15 - 41 U/L   ALT 169 (H) 0 - 44 U/L   Alkaline Phosphatase 644 (H) 38 - 126 U/L   Total Bilirubin 0.7 0.3 - 1.2 mg/dL   GFR, Estimated >88 >41 mL/min    Comment: (NOTE) Calculated using the CKD-EPI Creatinine Equation (2021)    Anion gap 12 5 - 15    Comment: Performed at [facility], [address], [place], [place] [address]  I-stat chem 8, ED (not at [facility], [facility] or [facility])     Status: Abnormal   Collection Time: 07/28/22  5:07 PM  Result Value Ref Range   Sodium 141 135 - 145 mmol/L   Potassium 3.2 (L) 3.5 - 5.1 mmol/L   Chloride 101 98 - 111 mmol/L   BUN 11 6 - 20 mg/dL   Creatinine, Ser 0.16 0.44 - 1.00 mg/dL   Glucose, Bld 93 70 - 99 mg/dL    Comment: Glucose reference range applies only to samples taken after fasting for at least 8 hours.   Calcium, Ion 1.18 1.15 - 1.40 mmol/L   TCO2 28 22 - 32 mmol/L   Hemoglobin 17.7 (H) 12.0 - 15.0 g/dL   HCT 01.0 (H) 93.2 - 35.5 %   MR Brain W and Wo Contrast  Result Date: 07/28/2022 CLINICAL DATA:  Multiple sclerosis.  Neurologic deficit. EXAM: MRI HEAD WITHOUT AND WITH CONTRAST TECHNIQUE: Multiplanar, multiecho pulse sequences of the brain and surrounding structures were obtained without and with intravenous contrast. CONTRAST:  4mL GADAVIST GADOBUTROL 1 MMOL/ML IV SOLN COMPARISON:  None Available. FINDINGS: Brain: No acute infarct, mass effect or extra-axial collection. No acute or chronic hemorrhage. There are numerous white matter lesions, predominantly affecting  the pericallosal and periventricular white matter, in unchanged distribution. There are no contrast-enhancing lesions. Vascular: Major flow voids are preserved. Skull and upper cervical spine: Normal calvarium and skull base. Visualized upper cervical spine and  soft tissues are normal. Sinuses/Orbits:No paranasal sinus fluid levels or advanced mucosal thickening. No mastoid or middle ear effusion. Normal orbits. IMPRESSION: Unchanged distribution of white matter lesions consistent with multiple sclerosis. No active demyelination. Electronically Signed   By: Deatra Robinson M.D.   On: 07/28/2022 20:34   MR THORACIC SPINE W WO CONTRAST  Result Date: 07/28/2022 CLINICAL DATA:  Multiple sclerosis.  Myelopathy. EXAM: MRI CERVICAL, THORACIC AND LUMBAR SPINE WITHOUT AND WITH CONTRAST TECHNIQUE: Multiplanar and multiecho pulse sequences of the cervical spine, to include the craniocervical junction and cervicothoracic junction, and thoracic and lumbar spine, were obtained without and with intravenous contrast. CONTRAST:  4mL GADAVIST GADOBUTROL 1 MMOL/ML IV SOLN COMPARISON:  08/26/2021 FINDINGS: Markedly motion degraded study. MRI CERVICAL SPINE FINDINGS Alignment: Physiologic. Vertebrae: No fracture, evidence of discitis, or bone lesion. Cord: Unchanged appearance of parenchymal white matter lesions within the spinal cord at the C2-5 levels. No contrast-enhancing lesions. Posterior Fossa, vertebral arteries, paraspinal tissues: Negative. Disc levels: No spinal canal stenosis or neural impingement. MRI THORACIC SPINE FINDINGS Alignment:  Physiologic. Vertebrae: No fracture, evidence of discitis, or bone lesion. Cord: Axial images are markedly motion degraded but white matter lesions centered at the T5 and T6-7 levels are again noted. There is no abnormal contrast enhancement. Paraspinal and other soft tissues: Negative. Disc levels: No spinal canal stenosis. MRI LUMBAR SPINE FINDINGS Segmentation:  Standard. Alignment:   Physiologic. Vertebrae:  No fracture, evidence of discitis, or bone lesion. Conus medullaris and cauda equina: Conus extends to the L1 level. Conus and cauda equina appear normal. Paraspinal and other soft tissues: Negative. Disc levels: No spinal canal stenosis or neural impingement. IMPRESSION: 1. Markedly motion degraded study. 2. Unchanged distribution of chronic demyelinating lesions within the cervical and thoracic spinal cord. No contrast-enhancing lesions to suggest active demyelination. 3. No spinal canal stenosis or neural impingement. Electronically Signed   By: Deatra Robinson M.D.   On: 07/28/2022 20:27   MR Cervical Spine W and Wo Contrast  Result Date: 07/28/2022 CLINICAL DATA:  Multiple sclerosis.  Myelopathy. EXAM: MRI CERVICAL, THORACIC AND LUMBAR SPINE WITHOUT AND WITH CONTRAST TECHNIQUE: Multiplanar and multiecho pulse sequences of the cervical spine, to include the craniocervical junction and cervicothoracic junction, and thoracic and lumbar spine, were obtained without and with intravenous contrast. CONTRAST:  4mL GADAVIST GADOBUTROL 1 MMOL/ML IV SOLN COMPARISON:  08/26/2021 FINDINGS: Markedly motion degraded study. MRI CERVICAL SPINE FINDINGS Alignment: Physiologic. Vertebrae: No fracture, evidence of discitis, or bone lesion. Cord: Unchanged appearance of parenchymal white matter lesions within the spinal cord at the C2-5 levels. No contrast-enhancing lesions. Posterior Fossa, vertebral arteries, paraspinal tissues: Negative. Disc levels: No spinal canal stenosis or neural impingement. MRI THORACIC SPINE FINDINGS Alignment:  Physiologic. Vertebrae: No fracture, evidence of discitis, or bone lesion. Cord: Axial images are markedly motion degraded but white matter lesions centered at the T5 and T6-7 levels are again noted. There is no abnormal contrast enhancement. Paraspinal and other soft tissues: Negative. Disc levels: No spinal canal stenosis. MRI LUMBAR SPINE FINDINGS Segmentation:   Standard. Alignment:  Physiologic. Vertebrae:  No fracture, evidence of discitis, or bone lesion. Conus medullaris and cauda equina: Conus extends to the L1 level. Conus and cauda equina appear normal. Paraspinal and other soft tissues: Negative. Disc levels: No spinal canal stenosis or neural impingement. IMPRESSION: 1. Markedly motion degraded study. 2. Unchanged distribution of chronic demyelinating lesions within the cervical and thoracic spinal cord. No contrast-enhancing lesions to suggest active demyelination. 3. No spinal canal  stenosis or neural impingement. Electronically Signed   By: Deatra Robinson M.D.   On: 07/28/2022 20:27   MR Lumbar Spine W Wo Contrast  Result Date: 07/28/2022 CLINICAL DATA:  Multiple sclerosis.  Myelopathy. EXAM: MRI CERVICAL, THORACIC AND LUMBAR SPINE WITHOUT AND WITH CONTRAST TECHNIQUE: Multiplanar and multiecho pulse sequences of the cervical spine, to include the craniocervical junction and cervicothoracic junction, and thoracic and lumbar spine, were obtained without and with intravenous contrast. CONTRAST:  4mL GADAVIST GADOBUTROL 1 MMOL/ML IV SOLN COMPARISON:  08/26/2021 FINDINGS: Markedly motion degraded study. MRI CERVICAL SPINE FINDINGS Alignment: Physiologic. Vertebrae: No fracture, evidence of discitis, or bone lesion. Cord: Unchanged appearance of parenchymal white matter lesions within the spinal cord at the C2-5 levels. No contrast-enhancing lesions. Posterior Fossa, vertebral arteries, paraspinal tissues: Negative. Disc levels: No spinal canal stenosis or neural impingement. MRI THORACIC SPINE FINDINGS Alignment:  Physiologic. Vertebrae: No fracture, evidence of discitis, or bone lesion. Cord: Axial images are markedly motion degraded but white matter lesions centered at the T5 and T6-7 levels are again noted. There is no abnormal contrast enhancement. Paraspinal and other soft tissues: Negative. Disc levels: No spinal canal stenosis. MRI LUMBAR SPINE FINDINGS  Segmentation:  Standard. Alignment:  Physiologic. Vertebrae:  No fracture, evidence of discitis, or bone lesion. Conus medullaris and cauda equina: Conus extends to the L1 level. Conus and cauda equina appear normal. Paraspinal and other soft tissues: Negative. Disc levels: No spinal canal stenosis or neural impingement. IMPRESSION: 1. Markedly motion degraded study. 2. Unchanged distribution of chronic demyelinating lesions within the cervical and thoracic spinal cord. No contrast-enhancing lesions to suggest active demyelination. 3. No spinal canal stenosis or neural impingement. Electronically Signed   By: Deatra Robinson M.D.   On: 07/28/2022 20:27    Pending Labs Unresulted Labs (From admission, onward)     Start     Ordered   07/28/22 2103  Rapid urine drug screen (hospital performed)  Add-on,   AD        07/28/22 2102            Vitals/Pain Today's Vitals   07/28/22 1419 07/28/22 1420 07/28/22 2017 07/28/22 2137  BP:   132/85   Pulse:   79   Resp:   16   Temp:   98.1 F (36.7 C)   TempSrc:   Oral   SpO2:   100%   Weight:  85 lb 15.7 oz (39 kg)    Height:  5\' 2"  (1.575 m)    PainSc: 10-Worst pain ever  8  6     Isolation Precautions No active isolations  Medications Medications  morphine (PF) 4 MG/ML injection 4 mg (4 mg Intravenous Given 07/28/22 1638)  ondansetron (ZOFRAN) injection 4 mg (4 mg Intravenous Given 07/28/22 1638)  LORazepam (ATIVAN) injection 2 mg (2 mg Intravenous Given 07/28/22 1816)  gadobutrol (GADAVIST) 1 MMOL/ML injection 4 mL (4 mLs Intravenous Contrast Given 07/28/22 1940)  morphine (PF) 4 MG/ML injection 4 mg (4 mg Intravenous Given 07/28/22 2038)    Mobility non-ambulatory

## 2022-07-28 NOTE — Assessment & Plan Note (Signed)
Continue home regimen of amlodipine As needed intravenous hydralazine for markedly elevated blood pressure

## 2022-07-28 NOTE — ED Notes (Signed)
Pt urinated for [place]. Not order in at the moment but urine was sent to lab.

## 2022-07-28 NOTE — Plan of Care (Addendum)
Patient briefly discussed with Dr. Silverio Lay, chart briefly reviewed and MRI imaging reviewed  In brief this is a challenging patient who has both MS as well as significant liver dysfunction which has been extensively worked up but the etiology of which remains unclear  Last neurology note from 01/22/2022 reviewed  From Dr. Zannie Cove notes regarding pain: Over the years, she has been complains of low back pain, lower extremity deep achy pain,  we have tried many different medications, either ineffective or she cannot tolerate it, Cymbalta, gabapentin, nortriptyline, NSAIDs Lyrica, Trileptal   From Dr. Zannie Cove note regarding her GI evaluation: "She was followed by our clinic intermittently, had extensive GI evaluation by Dr. 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, ferritin has been between 200-500 range, liver biopsy June 2022 showed chronic hepatitis with portal inflammation with ductular reaction, moderate fibrosis, differentiation is broad but clear-cut etiology was not found, staining was not consistent with typical hemochromatosis, was also seen by hematologist for evaluation, do not think that hemochromatosis is causing of her liver disease,   Magnetic resonance cholangio pancreatography, on May 19, 2021 showed prominence of the extra and center intrahepatic biliary tree with the common duct measuring 8 mm but with gentle tapering to the level of the ampulla, dilation of the duct is similar to prior ultrasound, failure reservoir effect post cholecystectomy, no hepatic imaging finding to suggest cyanosis, no suspicious hepatic lesion..." "she is referred to [facility] hepatologist  for evaluation evaluation, but never carried through due to lack of transportation"  There is a plan to reconsider immunomodulation therapy after her liver function test recovers or with clearance/support from GI regarding potential agents  There is also been concern for substance use (oxycodone positive without prescription for the same, THC positive on 01/22/2022)  She was seen in the ED for worsening pain on 1/22 and her exam was felt to be consistent with spasms with improvement with baclofen  Unfortunately she returns today with worsening pain that has led to worsening gait; liver function is notably worse as well, as well as new leukocytosis to 11  Latest Reference Range & Units 09/09/20 09:49 04/26/21 13:48 01/22/22 14:31 07/28/22 16:39  Alkaline Phosphatase 38 - 126 U/L 302 (H) 385 (H) 347 (H) 644 (H)  Albumin 3.5 - 5.0 g/dL 4.2 4.3 4.1 4.3  Albumin/Globulin Ratio 1.2 - 2.2   1.8 1.9   AST 15 - 41 U/L 20 54 (H) 29 65 (H)  ALT 0 - 44 U/L 23 51 (H) 52 (H) 169 (H)  Total Protein 6.5 - 8.1 g/dL 7.3 6.7 6.3 8.2 (H)  (H): Data is abnormally high  Curbside recommendations:  On review of [employer] reference, limited data is available on the hepatotoxicity of the baclofen though insert states that 5% of patients may develop mild serum aminotransferase elevations with a single case report published of a mild self-limited hepatitis attributed to baclofen; likely baclofen can be continued if it has been helpful for the patient but would defer to GI, may be safer to hold pending GI evaluation  Of note corticosteroids can trigger or worsen nonalcoholic steatohepatitis.  In particular high doses of IV corticosteroids especially methylprednisolone has been associated with acute liver injury, acute liver failure  and death.  In the absence of clear evidence for MS flare (negative MRI, though it is motion limited), would not start pulse dose steroids.  With new leukocytosis and worsening liver function,  would treat this as a pseudoflare at this time; appreciate ED/primary team's medical workup  Given her incompletely evaluated liver pathology is significantly limiting her multiple sclerosis treatment, clarification of the underlying pathology will be very helpful to further guide immunosuppression in the future  Addendum, note made of nursing reports of new incontinence.  Therefore patient does need a full neurological evaluation and further recommendations will be made pending that evaluation.  She may be a candidate for PLEX if clinical examination is concerning for new MS symptoms and therefore I would like to have her admitted at [facility], d/w Dr. Leafy Half  These are curbside recommendations based upon the information readily available in the chart on brief review as well as history and examination information provided to me by requesting provider (Dr. Silverio Lay) and do not replace a full detailed consult.

## 2022-07-28 NOTE — Assessment & Plan Note (Signed)
Notably worsening polycythemia as of late Patient has a longstanding history of smoking which is the likely culprit Possibly some superimposed element of dehydration as well Will hydrate gently, monitor hemoglobin and hematocrit with serial CBCs

## 2022-07-28 NOTE — ED Triage Notes (Signed)
Patient brought in from home by EMS with c/o bilateral leg pain due to HX of MS. Patient states she was seen here x2 weeks ago for same complaint and wants more evaluation due to increase in pain. States she has not been able to eat/drink due to pain.  112/80 100 18 98% RA CBG: 119

## 2022-07-28 NOTE — Assessment & Plan Note (Signed)
Patient reports severe low back pain over approximate the same period of time as the weakness See assessment and plan above, etiology unclear As needed opiate-based analgesics for now, limiting use of intravenous opiates if possible and will attempt to wean oral opiates in the coming days as able PT evaluation

## 2022-07-28 NOTE — Assessment & Plan Note (Signed)
Patient reports progressively worsening bilateral lower extremity weakness for approximately 1 month associated with severe low back pain and bilateral lower extremity pain. No evidence of physical deformity on exam, MRI of the spine reveals no new demyelinating lesions and no significant injury/deformity of the vertebra that would compress the spinal cord. Considering patient's clinical symptoms and new onset of urinary incontinence per my discussion with Dr. Ewing Schlein with neurology who recommends hospitalization at [facility] for formal neurology consultation and determination as to whether plasma exchange is warranted. No steroids will be initiated at this time due to risks associated with patient's known history of liver disease Obtaining vitamin B12, urinalysis, creatine kinase and TSH

## 2022-07-29 DIAGNOSIS — M79605 Pain in left leg: Secondary | ICD-10-CM

## 2022-07-29 DIAGNOSIS — M549 Dorsalgia, unspecified: Secondary | ICD-10-CM

## 2022-07-29 DIAGNOSIS — M79604 Pain in right leg: Secondary | ICD-10-CM

## 2022-07-29 LAB — FERRITIN: Ferritin: 150 ng/mL (ref 11–307)

## 2022-07-29 LAB — HEPATIC FUNCTION PANEL
ALT: 130 U/L — ABNORMAL HIGH (ref 0–44)
AST: 75 U/L — ABNORMAL HIGH (ref 15–41)
Albumin: 3.3 g/dL — ABNORMAL LOW (ref 3.5–5.0)
Alkaline Phosphatase: 558 U/L — ABNORMAL HIGH (ref 38–126)
Bilirubin, Direct: 0.2 mg/dL (ref 0.0–0.2)
Indirect Bilirubin: 0.7 mg/dL (ref 0.3–0.9)
Total Bilirubin: 0.9 mg/dL (ref 0.3–1.2)
Total Protein: 6.3 g/dL — ABNORMAL LOW (ref 6.5–8.1)

## 2022-07-29 LAB — VITAMIN D 25 HYDROXY (VIT D DEFICIENCY, FRACTURES): Vit D, 25-Hydroxy: 51.22 ng/mL (ref 30–100)

## 2022-07-29 LAB — VITAMIN B12: Vitamin B-12: 535 pg/mL (ref 180–914)

## 2022-07-29 LAB — RAPID URINE DRUG SCREEN, HOSP PERFORMED
Amphetamines: NOT DETECTED
Barbiturates: NOT DETECTED
Benzodiazepines: POSITIVE — AB
Cocaine: NOT DETECTED
Opiates: POSITIVE — AB
Tetrahydrocannabinol: NOT DETECTED

## 2022-07-29 LAB — TSH: TSH: 2.661 u[IU]/mL (ref 0.350–4.500)

## 2022-07-29 LAB — HIV ANTIBODY (ROUTINE TESTING W REFLEX): HIV Screen 4th Generation wRfx: NONREACTIVE

## 2022-07-29 MED ORDER — OXYCODONE HCL 5 MG PO TABS
10.0000 mg | ORAL_TABLET | ORAL | Status: DC | PRN
  Administered 2022-07-29 – 2022-07-30 (×6): 10 mg via ORAL
  Filled 2022-07-29 (×6): qty 2

## 2022-07-29 MED ORDER — ORAL CARE MOUTH RINSE: 15.0000 mL | OROMUCOSAL | Status: DC | PRN

## 2022-07-29 MED ORDER — OXYCODONE HCL 5 MG PO TABS: 5.0000 mg | ORAL_TABLET | ORAL | Status: DC | PRN

## 2022-07-29 NOTE — Consult Note (Addendum)
Neurology Consultation  Reason for Consult: Bilateral leg pain and weakness Referring Physician: Dr. Lowell Guitar  CC: Bilateral leg pain  History is obtained from: Patient and medical record  HPI: Gabriela Campbell is a 57 y.o. female past medical history of MS not on any disease modifying therapies due to abnormal liver function tests and follows with Dr. Terrace Arabia at [facility] neurology, walks with a walker at baseline, hypertension, depression and anxiety presented to [name], ED for evaluation of worsening bilateral lower extremity pain and weakness. Per patient she states that she has had ongoing problems with pain and weakness since last summer and has continued but feels that in the last month she has had continuing worsening pain left greater than right leg and weakness.  She states that her legs just give out and she falls and feels that something is wrong and needed to be evaluated.  She states that she has been in contact with Dr. Terrace Arabia and they are still waiting for her liver function to improve with starting medications.  She states she has numbness in bilateral feet and hands which is not new.  She denies any other complaints such as weakness in upper extremities or vision changes.  MRI brain with and without unchanged distribution of white matter lesions, no active demyelination.  MRI C spine and T-spine and L-spine with and without no new acute process to suggest active demyelination.  Per chart review, she was noted to have 2 episodes of incontinence 1 was while she was in MRI.  She was transferred to [facility] for further evaluation by neurology  Upon further detailed discussion about her incontinence she says she knows when she has to urinate or have a bowel movement.  Sometimes due to her pain in her legs she is able to get to the bathroom quick enough and will have an accident.  She has some tingling in the fingers and toes but does not report any other numbness.   ROS: Full ROS was  performed and is negative except as noted in the HPI.    Past Medical History:  Diagnosis Date   Allergy    Multiple sclerosis (HCC)    Shingles    Weakness      Family History  Problem Relation Age of Onset   Cancer Paternal Grandmother    Cancer Paternal Aunt    Cancer Paternal Aunt    Colon cancer Neg Hx    Stomach cancer Neg Hx    Esophageal cancer Neg Hx    Pancreatic cancer Neg Hx      Social History:   reports that she has been smoking cigarettes. She has never used smokeless tobacco. She reports that she does not drink alcohol and does not use drugs.  Medications  Current Facility-Administered Medications:    acetaminophen (TYLENOL) tablet 650 mg, 650 mg, Oral, Q6H PRN **OR** acetaminophen (TYLENOL) suppository 650 mg, 650 mg, Rectal, Q6H PRN, Shalhoub, Deno Lunger, MD   amLODipine (NORVASC) tablet 5 mg, 5 mg, Oral, Daily, Shalhoub, Deno Lunger, MD   cholecalciferol (VITAMIN D3) 25 MCG (1000 UNIT) tablet 2,000 Units, 2,000 Units, Oral, Daily, Shalhoub, Deno Lunger, MD, 2,000 Units at 07/29/22 0829   enoxaparin (LOVENOX) injection 30 mg, 30 mg, Subcutaneous, Q24H, Shalhoub, Deno Lunger, MD, 30 mg at 07/29/22 0830   hydrALAZINE (APRESOLINE) injection 10 mg, 10 mg, Intravenous, Q6H PRN, Shalhoub, Deno Lunger, MD   lactated ringers infusion, , Intravenous, Continuous, Shalhoub, Deno Lunger, MD, Last Rate: 125 mL/hr at  07/28/22 2314, New Bag at 07/28/22 2314   nicotine (NICODERM CQ - dosed in mg/24 hours) patch 21 mg, 21 mg, Transdermal, Daily, Shalhoub, Deno Lunger, MD   ondansetron [facility]) tablet 4 mg, 4 mg, Oral, Q6H PRN **OR** ondansetron (ZOFRAN) injection 4 mg, 4 mg, Intravenous, Q6H PRN, Shalhoub, Deno Lunger, MD   oxyCODONE-acetaminophen (PERCOCET/ROXICET) 5-325 MG per tablet 1 tablet, 1 tablet, Oral, Q6H PRN **OR** oxyCODONE-acetaminophen (PERCOCET/ROXICET) 5-325 MG per tablet 2 tablet, 2 tablet, Oral, Q6H PRN, Marinda Elk, MD, 2 tablet at 07/29/22 0658   polyethylene glycol  (MIRALAX / GLYCOLAX) packet 17 g, 17 g, Oral, Daily PRN, Marinda Elk, MD   Exam: Current vital signs: BP 129/76 (BP Location: Right Arm)   Pulse 62   Temp 97.8 F (36.6 C) (Oral)   Resp 18   Ht 5\' 2"  (1.575 m)   Wt 39 kg   SpO2 100%   BMI 15.73 kg/m  Vital signs in last 24 hours: Temp:  [97.7 F (36.5 C)-98.3 F (36.8 C)] 97.8 F (36.6 C) (07/28 0655) Pulse Rate:  [62-90] 62 (07/28 0655) Resp:  [16-18] 18 (07/28 0655) BP: (105-136)/(68-120) 129/76 (07/28 0655) SpO2:  [97 %-100 %] 100 % (07/28 0655) Weight:  [39 kg] 39 kg (07/27 1420)  GENERAL: Awake, alert in NAD HEENT: - Normocephalic and atraumatic, dry mm, LUNGS - Clear to auscultation bilaterally with no wheezes CV - S1S2 RRR, no m/r/g, equal pulses bilaterally. ABDOMEN - Soft, nontender, nondistended with normoactive BS Ext: warm, well perfused, intact peripheral pulses, no edema  NEURO:  Mental Status: AA&Ox4 Language: speech is clear.  Naming, repetition, fluency, and comprehension intact. Cranial Nerves: PERRL EOMI, visual fields full, no facial asymmetry, facial sensation intact, hearing intact, tongue/uvula/soft palate midline, normal sternocleidomastoid and trapezius muscle strength. No evidence of tongue atrophy or fibrillations Motor: 5/5 in upper extremities 5- out of 5 in lower extremities due to pain and nonfocal. Tone: is normal and bulk is normal Sensation-has baseline numbness in bilateral hands and bilateral feet No sensory level noted. Coordination: Mildly tremulous in both upper and lower extremities  this is likely her baseline Reflexes: 2-3+ bilateral patellar Achilles and bilateral biceps triceps brachial radialis.  Bilateral Hoffmann sign noted.  No clonus noted.  Initial Babinski response appears to be negative but then will fan her toes out. Gait- deferred  Labs I have reviewed labs in epic and the results pertinent to this consultation are:  CBC    Component Value Date/Time   WBC  11.1 (H) 07/28/2022 1639   RBC 5.34 (H) 07/28/2022 1639   HGB 17.7 (H) 07/28/2022 1707   HGB 13.5 01/22/2022 1431   HCT 52.0 (H) 07/28/2022 1707   HCT 39.4 01/22/2022 1431   PLT 168 07/28/2022 1639   PLT 152 01/22/2022 1431   MCV 93.4 07/28/2022 1639   MCV 95 01/22/2022 1431   MCH 31.8 07/28/2022 1639   MCHC 34.1 07/28/2022 1639   RDW 13.2 07/28/2022 1639   RDW 12.3 01/22/2022 1431   LYMPHSABS 1.9 07/28/2022 1639   LYMPHSABS 1.9 01/22/2022 1431   MONOABS 0.4 07/28/2022 1639   EOSABS 0.0 07/28/2022 1639   EOSABS 0.1 01/22/2022 1431   BASOSABS 0.1 07/28/2022 1639   BASOSABS 0.0 01/22/2022 1431    CMP     Component Value Date/Time   NA 141 07/28/2022 1707   NA 144 01/22/2022 1431   K 3.2 (L) 07/28/2022 1707   CL 101 07/28/2022 1707   CO2 27 07/28/2022  1639   GLUCOSE 93 07/28/2022 1707   BUN 11 07/28/2022 1707   BUN 11 01/22/2022 1431   CREATININE 0.70 07/28/2022 1707   CREATININE 1.04 (H) 07/29/2020 1034   CALCIUM 9.7 07/28/2022 1639   PROT 6.3 (L) 07/29/2022 0655   PROT 6.3 01/22/2022 1431   ALBUMIN 3.3 (L) 07/29/2022 0655   ALBUMIN 4.1 01/22/2022 1431   ALBUMIN 4.2 04/16/2017 1143   AST 75 (H) 07/29/2022 0655   AST 184 (HH) 07/29/2020 1034   ALT 130 (H) 07/29/2022 0655   ALT 143 (H) 07/29/2020 1034   ALKPHOS 558 (H) 07/29/2022 0655   BILITOT 0.9 07/29/2022 0655   BILITOT 0.3 01/22/2022 1431   BILITOT 0.5 07/29/2020 1034   GFRNONAA >60 07/28/2022 1639   GFRNONAA >60 07/29/2020 1034   GFRAA 85 11/24/2019 1127    Lipid Panel  No results found for: "CHOL", "TRIG", "HDL", "CHOLHDL", "VLDL", "LDLCALC", "LDLDIRECT"  Lab Results  Component Value Date   HGBA1C 5.9 (H) 07/29/2020      Imaging I have reviewed the images obtained:  MRI examination of the brain Unchanged distribution of white matter lesions consistent with multiple sclerosis. No active demyelination.   MRI C and T-spine and L-spine with and without Markedly motion degraded study. Unchanged  distribution of chronic demyelinating lesions within the cervical and thoracic spinal cord. No contrast-enhancing lesions to suggest active demyelination. No spinal canal stenosis or neural impingement  Labs: TSH 2.661 Ferritin 150 AST 75, ALT 130 alk phos 558 UDS positive for opiates and benzos Vitamin B12 pending  Assessment:   Gabriela Campbell is a 57 y.o. female past medical history of MS not on any disease modifying therapies due to abnormal liver function tests and follows with Dr. Terrace Arabia at [facility] neurology, walks with a walker at baseline, hypertension, depression and anxiety presented to [name], ED for evaluation of worsening bilateral lower extremity pain and weakness.  She has no sensory level on exam.  She knows when she has to use the bathroom and will only have a mistake if she can get to the bathroom quick enough due to her leg pain.  She never required in and out caths in the past.  Her symptoms of leg pain are chronic and been going on for several years.  Sometimes she will have an exacerbation of her pain which is not unusual which also limits her ability to walk.  She sees a neurologist outpatient has been following her closely.  She is to be on Tysabri which is thought to be because her liver disease and has been off disease modifying medication since then.  Impression: History of MS which appears to be in remission as MRI scan does not show enhancing lesion with a reassuring exam.  Elevated liver enzymes that preclude treatment with disease modifying therapy-already referred to [facility] hepatology  Chronic lower extremity and low back pain likely related to MS limiting her ability to walk  Recommendations: Check vitamin D and B12 levels replace if low.  Goal B12 level to be greater than 600  Treat her pain aggressively since this in the setting of MS with gabapentin that can be titrated up to 600 mg 4 times daily or Lyrica, including narcotic medications, consider  referral to pain clinic  No need for inpatient treatment of her MS  follow-up with her neurologist outpatient for further long-term recommendations  Neurology will sign off.  Please call with questions.  Patient's questions were answered to her satisfaction.  MDM: High. Pertinent labs, imaging results reviewed by me and considered in my decision making. Independently reviewed imaging. Medical records reviewed. Discussed the patient with another medical provider/personnel. Obtained history from someone other than the patient.     Audi Wettstein,MD

## 2022-07-29 NOTE — Progress Notes (Signed)
PROGRESS NOTE    Gabriela Campbell  ZOX:[id number] DOB: 07-18-1965 DOA: 07/28/2022 PCP: Pcp, No  Chief Complaint  Patient presents with   Leg Pain    Bilateral    Brief Narrative:   57 year old female with past medical history of relapsing remitting multiple sclerosis (follows with Dr. Terrace Arabia with [facility] Neurology) not currently on therapy due to ongoing abnormal liver function tests since 2021, hereditary hemochromatosis, hypertension, depression and anxiety disorder who presents to [facility] emergency department via EMS due to complaints of worsening bilateral lower extremity pain.   An excerpt of Neurology's summarizing her liver workup thus far:  She had extensive GI evaluation by Dr. Adela Lank for abnormal liver function, which has been ongoing since 2019.  She had ultrasound in May 2022 was normal, serology work-up with iron saturation of 56%, otherwise negative for clear etiology, hemochromatosis genetic testing revealed compound heterozygote for H63D and C282Y genes, ferritin has been between 200-500 range, liver biopsy June 2022 showed chronic hepatitis with portal inflammation with ductular reaction, moderate fibrosis, differentiation is broad but clear-cut etiology was not found, staining was not consistent with typical hemochromatosis, was also seen by hematologist for evaluation, do not think that hemochromatosis is causing of her liver disease.  Magnetic resonance cholangio pancreatography, on May 19, 2021 showed prominence of the extra and center intrahepatic biliary tree with the common duct measuring 8 mm but with gentle tapering to the level of the ampulla, dilation of the duct is similar to prior ultrasound, failure reservoir effect post cholecystectomy, no hepatic imaging finding to suggest cyanosis, no suspicious hepatic lesion.  She was referred to [facility] hepatologist for evaluation evaluation, but never carried through due to lack of transportation.   Patient reports  that for the past 2 weeks patient has been experiencing severe low back pain.  This pain is sharp in quality and severe in intensity and worse with movement.  This has been associated with progressively worsening bilateral lower extremity weakness and difficulty with ambulation.  Patient states that her pain is so bad that she is unable to eat or drink much.  Patient denies any associated fever, dysuria or GI distress.  Patient denies any recent trauma.   Due to progressively worsening symptoms EMS was contacted and patient was brought in to [facility] emergency department for evaluation.   Upon evaluation in the emergency department due to patient's severe symptoms MRI of the brain with and without contrast in addition to MRI of the lumbar cervical and thoracic spine with and without contrast were performed.  While there was Gabriela Campbell lot of motion artifact on these images there is no obvious evidence of Gabriela Campbell acute demyelinating lesions.  Throughout the course of the emergency department workup the patient began to also experience episodes of urinary incontinence.  Case was discussed with Dr. Iver Nestle with neurology and in light of the patient's progressive symptoms recommendation was for the patient to be admitted to [facility] for continued evaluation and in person neurology consultation.  The hospitalist group was then called to assess the patient for admission to the hospital.  Assessment & Plan:   Principal Problem:   Bilateral leg weakness Active Problems:   Severe low back pain   Relapsing remitting multiple sclerosis (HCC)   Elevated liver enzymes   Essential hypertension   Hypokalemia   Polycythemia   Hereditary hemochromatosis (HCC)  Bilateral leg weakness  Multiple Sclerosis Appreciate neurology assistance - he notes her symptoms of leg pain are  chronic and have been going on for years, occasionally has exacerbation of her pain limiting ability to walk.  Neurology recommending  aggressive pain control, check B12 and vitamin D.  No need for inpatient treatment of MS, follow with outpatient neurology for long term recs. MRI brain without active demyelination, MRI lumbar spine, thoracic, cervical spine without active demyelination.  Motion degraded study.   B12, vit D TSH wnl, CK pending   Chronic Low Back Pain and Lower Extremity Pain - pain in legs for years per patient, back for about 3 months - MRI lumbar spine without acute findings - avoiding APAP due to liver function abnormalities, she doesn't tolerate NSAIDs or gabapentin or lyrica.  Cymbalta as well she notes she hasn't tolerated.  I discussed I can send her home with Ira Dougher few days of opiates, but she'll need to have outpatient provider take over prescription of this.  She'll likely need pain management.  Discussed complexities of chronic opiate therapy, I recommended trying to avoid this if possible.  - PT/OT  Elevated liver enzymes Longstanding elevated liver enzymes since 2021 Patient has undergone extensive workup detailed in italics in HPI including liver biopsy 06/2020 and MRCP 05/2021. Patient follows with Dr. Adela Lank with [facility] gastroenterology Appreciate Dr. Haywood Pao recs - follow GGT   Essential hypertension Continue home regimen of amlodipine   Hypokalemia Replacing with oral potassium chloride   Polycythemia Consider follow outpatient with heme   Hereditary hemochromatosis [facility]) Patient has been evaluated by oncology in the past.  At that time they did not feel that the patient's hemochromatosis was contributing to her liver disease. Obtaining repeat ferritin level.    DVT prophylaxis: lovenox Code Status: full Family Communication: none Disposition:   Status is: Inpatient Remains inpatient appropriate because: need for continued inpatient care   Consultants:  neurology  Procedures:  none  Antimicrobials:  Anti-infectives (From admission, onward)    None        Subjective: No complaints  Objective: Vitals:   07/29/22 0600 07/29/22 0655 07/29/22 1215 07/29/22 1553  BP: 136/82 129/76 118/83 120/75  Pulse: 70 62 65 67  Resp: 18 18 18 16   Temp:  97.8 F (36.6 C) 98 F (36.7 C) 98.3 F (36.8 C)  TempSrc:  Oral Oral Oral  SpO2: 98% 100% 100% 99%  Weight:      Height:        Intake/Output Summary (Last 24 hours) at 07/29/2022 1715 Last data filed at 07/29/2022 1500 Gross per 24 hour  Intake 1000 ml  Output --  Net 1000 ml   Filed Weights   07/28/22 1420  Weight: 39 kg    Examination:  General exam: Appears calm and comfortable  Respiratory system: unlabored Cardiovascular system: RRR Gastrointestinal system: Abdomen is nondistended, soft and nontender.  Central nervous system: Alert and oriented. Symmetric strength. Extremities: no LEE    Data Reviewed: I have personally reviewed following labs and imaging studies  CBC: Recent Labs  Lab 07/28/22 1639 07/28/22 1707  WBC 11.1*  --   NEUTROABS 8.7*  --   HGB 17.0* 17.7*  HCT 49.9* 52.0*  MCV 93.4  --   PLT 168  --     Basic Metabolic Panel: Recent Labs  Lab 07/28/22 1639 07/28/22 1707  NA 139 141  K 3.3* 3.2*  CL 100 101  CO2 27  --   GLUCOSE 99 93  BUN 12 11  CREATININE 0.76 0.70  CALCIUM 9.7  --     GFR:  Estimated Creatinine Clearance: 47.2 mL/min (by C-G formula based on SCr of 0.7 mg/dL).  Liver Function Tests: Recent Labs  Lab 07/28/22 1639 07/29/22 0655  AST 65* 75*  ALT 169* 130*  ALKPHOS 644* 558*  BILITOT 0.7 0.9  PROT 8.2* 6.3*  ALBUMIN 4.3 3.3*    CBG: No results for input(s): "GLUCAP" in the last 168 hours.   No results found for this or any previous visit (from the past 240 hour(s)).       Radiology Studies: MR Brain W and Wo Contrast  Result Date: 07/28/2022 CLINICAL DATA:  Multiple sclerosis.  Neurologic deficit. EXAM: MRI HEAD WITHOUT AND WITH CONTRAST TECHNIQUE: Multiplanar, multiecho pulse sequences of the brain  and surrounding structures were obtained without and with intravenous contrast. CONTRAST:  4mL GADAVIST GADOBUTROL 1 MMOL/ML IV SOLN COMPARISON:  None Available. FINDINGS: Brain: No acute infarct, mass effect or extra-axial collection. No acute or chronic hemorrhage. There are numerous white matter lesions, predominantly affecting the pericallosal and periventricular white matter, in unchanged distribution. There are no contrast-enhancing lesions. Vascular: Major flow voids are preserved. Skull and upper cervical spine: Normal calvarium and skull base. Visualized upper cervical spine and soft tissues are normal. Sinuses/Orbits:No paranasal sinus fluid levels or advanced mucosal thickening. No mastoid or middle ear effusion. Normal orbits. IMPRESSION: Unchanged distribution of white matter lesions consistent with multiple sclerosis. No active demyelination. Electronically Signed   By: Deatra Robinson M.D.   On: 07/28/2022 20:34   MR THORACIC SPINE W WO CONTRAST  Result Date: 07/28/2022 CLINICAL DATA:  Multiple sclerosis.  Myelopathy. EXAM: MRI CERVICAL, THORACIC AND LUMBAR SPINE WITHOUT AND WITH CONTRAST TECHNIQUE: Multiplanar and multiecho pulse sequences of the cervical spine, to include the craniocervical junction and cervicothoracic junction, and thoracic and lumbar spine, were obtained without and with intravenous contrast. CONTRAST:  4mL GADAVIST GADOBUTROL 1 MMOL/ML IV SOLN COMPARISON:  08/26/2021 FINDINGS: Markedly motion degraded study. MRI CERVICAL SPINE FINDINGS Alignment: Physiologic. Vertebrae: No fracture, evidence of discitis, or bone lesion. Cord: Unchanged appearance of parenchymal white matter lesions within the spinal cord at the C2-5 levels. No contrast-enhancing lesions. Posterior Fossa, vertebral arteries, paraspinal tissues: Negative. Disc levels: No spinal canal stenosis or neural impingement. MRI THORACIC SPINE FINDINGS Alignment:  Physiologic. Vertebrae: No fracture, evidence of discitis,  or bone lesion. Cord: Axial images are markedly motion degraded but white matter lesions centered at the T5 and T6-7 levels are again noted. There is no abnormal contrast enhancement. Paraspinal and other soft tissues: Negative. Disc levels: No spinal canal stenosis. MRI LUMBAR SPINE FINDINGS Segmentation:  Standard. Alignment:  Physiologic. Vertebrae:  No fracture, evidence of discitis, or bone lesion. Conus medullaris and cauda equina: Conus extends to the L1 level. Conus and cauda equina appear normal. Paraspinal and other soft tissues: Negative. Disc levels: No spinal canal stenosis or neural impingement. IMPRESSION: 1. Markedly motion degraded study. 2. Unchanged distribution of chronic demyelinating lesions within the cervical and thoracic spinal cord. No contrast-enhancing lesions to suggest active demyelination. 3. No spinal canal stenosis or neural impingement. Electronically Signed   By: Deatra Robinson M.D.   On: 07/28/2022 20:27   MR Cervical Spine W and Wo Contrast  Result Date: 07/28/2022 CLINICAL DATA:  Multiple sclerosis.  Myelopathy. EXAM: MRI CERVICAL, THORACIC AND LUMBAR SPINE WITHOUT AND WITH CONTRAST TECHNIQUE: Multiplanar and multiecho pulse sequences of the cervical spine, to include the craniocervical junction and cervicothoracic junction, and thoracic and lumbar spine, were obtained without and with intravenous contrast. CONTRAST:  4mL  GADAVIST GADOBUTROL 1 MMOL/ML IV SOLN COMPARISON:  08/26/2021 FINDINGS: Markedly motion degraded study. MRI CERVICAL SPINE FINDINGS Alignment: Physiologic. Vertebrae: No fracture, evidence of discitis, or bone lesion. Cord: Unchanged appearance of parenchymal white matter lesions within the spinal cord at the C2-5 levels. No contrast-enhancing lesions. Posterior Fossa, vertebral arteries, paraspinal tissues: Negative. Disc levels: No spinal canal stenosis or neural impingement. MRI THORACIC SPINE FINDINGS Alignment:  Physiologic. Vertebrae: No fracture,  evidence of discitis, or bone lesion. Cord: Axial images are markedly motion degraded but white matter lesions centered at the T5 and T6-7 levels are again noted. There is no abnormal contrast enhancement. Paraspinal and other soft tissues: Negative. Disc levels: No spinal canal stenosis. MRI LUMBAR SPINE FINDINGS Segmentation:  Standard. Alignment:  Physiologic. Vertebrae:  No fracture, evidence of discitis, or bone lesion. Conus medullaris and cauda equina: Conus extends to the L1 level. Conus and cauda equina appear normal. Paraspinal and other soft tissues: Negative. Disc levels: No spinal canal stenosis or neural impingement. IMPRESSION: 1. Markedly motion degraded study. 2. Unchanged distribution of chronic demyelinating lesions within the cervical and thoracic spinal cord. No contrast-enhancing lesions to suggest active demyelination. 3. No spinal canal stenosis or neural impingement. Electronically Signed   By: Deatra Robinson M.D.   On: 07/28/2022 20:27   MR Lumbar Spine W Wo Contrast  Result Date: 07/28/2022 CLINICAL DATA:  Multiple sclerosis.  Myelopathy. EXAM: MRI CERVICAL, THORACIC AND LUMBAR SPINE WITHOUT AND WITH CONTRAST TECHNIQUE: Multiplanar and multiecho pulse sequences of the cervical spine, to include the craniocervical junction and cervicothoracic junction, and thoracic and lumbar spine, were obtained without and with intravenous contrast. CONTRAST:  4mL GADAVIST GADOBUTROL 1 MMOL/ML IV SOLN COMPARISON:  08/26/2021 FINDINGS: Markedly motion degraded study. MRI CERVICAL SPINE FINDINGS Alignment: Physiologic. Vertebrae: No fracture, evidence of discitis, or bone lesion. Cord: Unchanged appearance of parenchymal white matter lesions within the spinal cord at the C2-5 levels. No contrast-enhancing lesions. Posterior Fossa, vertebral arteries, paraspinal tissues: Negative. Disc levels: No spinal canal stenosis or neural impingement. MRI THORACIC SPINE FINDINGS Alignment:  Physiologic. Vertebrae:  No fracture, evidence of discitis, or bone lesion. Cord: Axial images are markedly motion degraded but white matter lesions centered at the T5 and T6-7 levels are again noted. There is no abnormal contrast enhancement. Paraspinal and other soft tissues: Negative. Disc levels: No spinal canal stenosis. MRI LUMBAR SPINE FINDINGS Segmentation:  Standard. Alignment:  Physiologic. Vertebrae:  No fracture, evidence of discitis, or bone lesion. Conus medullaris and cauda equina: Conus extends to the L1 level. Conus and cauda equina appear normal. Paraspinal and other soft tissues: Negative. Disc levels: No spinal canal stenosis or neural impingement. IMPRESSION: 1. Markedly motion degraded study. 2. Unchanged distribution of chronic demyelinating lesions within the cervical and thoracic spinal cord. No contrast-enhancing lesions to suggest active demyelination. 3. No spinal canal stenosis or neural impingement. Electronically Signed   By: Deatra Robinson M.D.   On: 07/28/2022 20:27        Scheduled Meds:  amLODipine  5 mg Oral Daily   cholecalciferol  2,000 Units Oral Daily   enoxaparin (LOVENOX) injection  30 mg Subcutaneous Q24H   nicotine  21 mg Transdermal Daily   Continuous Infusions:   LOS: 1 day    Time spent: over 30 min    Lacretia Nicks, MD [facility]   To contact the attending provider between 7A-7P or the covering provider during after hours 7P-7A, please log into the web site [email or web address] and access using universal  [place] password for that web site. If you do not have the password, please call the hospital operator.  07/29/2022, 5:15 PM

## 2022-07-29 NOTE — Progress Notes (Signed)
Pt arrived to 3w21, alert and oriented x4, VS stable. Pt in no distress, denied chest pain and SOB. Identified appropriately Cardiac monitor in place and CCMD notified. Pt oriented to room and equipment, instructed to use call bell for assistance, and call bell left within pt reach. [name]

## 2022-07-29 NOTE — Consult Note (Signed)
CONSULT NOTE FOR [facility]  Reason for Consult: Abnormal liver enzymes Referring Physician: [facility]  [name] HPI: 57 year old female with relapsing and remitting MS and abnormal liver enzymes admitted for her MS.  She was noted to have persistently abnormal liver enzymes and a GI consultation was requested.  The patient had extensive work up with Dr. Adela Lank.  Initially her elevated liver enzymes were thought to be from Tysabri, but her liver enzymes did not improved off of the medication.  Extensive serologic work up was performed, which was negative for autoimmune disease.  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  A liver biopsy was obtained and it showed portal inflammation with ductular reaction.  There was minimal steatosis and no evidence of cholestasis.  Iron deposition is consistent with her H282Y/H63D compound heterozygosity, but there was no evidence of iron overload.  She exhibited a 2-3 fibrosis score.  MRCP showed some prominence of the central intrahepatic biliary duct and the CBD measured 8 mm in the setting of a cholecystectomy state.  The patient does not report any abdominal pain.  Her neurologist did not restart her Tysabri.  Past Medical History:  Diagnosis Date   Allergy    Multiple sclerosis (HCC)    Shingles    Weakness     Past Surgical History:  Procedure Laterality Date   ABDOMINAL HYSTERECTOMY     CHOLECYSTECTOMY      Family History  Problem Relation Age of Onset   Cancer Paternal Grandmother    Cancer Paternal Aunt    Cancer Paternal Aunt    Colon cancer Neg Hx    Stomach cancer Neg Hx    Esophageal cancer Neg Hx    Pancreatic cancer Neg Hx     Social History:  reports that she has been smoking cigarettes. She has never used smokeless tobacco. She reports that she does not drink alcohol and does not use drugs.  Allergies:  Allergies  Allergen Reactions   Dilaudid [Hydromorphone Hcl] Anaphylaxis    Fish-Derived Products Anaphylaxis   Ibuprofen Anaphylaxis   Iodine Anaphylaxis   Penicillins Anaphylaxis    Has patient had a PCN reaction causing immediate rash, facial/tongue/throat swelling, SOB or lightheadedness with hypotension:yes Has patient had a PCN reaction causing severe rash involving mucus membranes or skin necrosis: no Has patient had a PCN reaction that required hospitalization no Has patient had a PCN reaction occurring within the last 10 years: no If all of the above answers are "NO", then may proceed with Cephalosporin use.    Shellfish-Derived Products Anaphylaxis   Shrimp Extract Anaphylaxis   Baclofen Other (See Comments)    Makes the patient feel wobbly   Gabapentin Other (See Comments)    GI upset   Codeine Nausea Only   Tramadol Nausea And Vomiting    Medications: Scheduled:  amLODipine  5 mg Oral Daily   cholecalciferol  2,000 Units Oral Daily   enoxaparin (LOVENOX) injection  30 mg Subcutaneous Q24H   nicotine  21 mg Transdermal Daily   Continuous:  lactated ringers 125 mL/hr at 07/28/22 2314    Results for orders placed or performed during the hospital encounter of 07/28/22 (from the past 24 hour(s))  Urinalysis, Routine w reflex microscopic -Urine, Clean Catch     Status: Abnormal   Collection Time: 07/28/22  4:15 PM  Result Value Ref Range   Color, Urine YELLOW YELLOW   APPearance HAZY (A) CLEAR   Specific Gravity, Urine  1.013 1.005 - 1.030   pH 5.0 5.0 - 8.0   Glucose, UA NEGATIVE NEGATIVE mg/dL   Hgb urine dipstick SMALL (A) NEGATIVE   Bilirubin Urine NEGATIVE NEGATIVE   Ketones, ur NEGATIVE NEGATIVE mg/dL   Protein, ur NEGATIVE NEGATIVE mg/dL   Nitrite NEGATIVE NEGATIVE   Leukocytes,Ua TRACE (A) NEGATIVE   RBC / HPF 0-5 0 - 5 RBC/hpf   WBC, UA 11-20 0 - 5 WBC/hpf   Bacteria, UA RARE (A) NONE SEEN   Squamous Epithelial / HPF 0-5 0 - 5 /HPF   Mucus PRESENT   Rapid urine drug screen (hospital performed)     Status: Abnormal   Collection  Time: 07/28/22  4:15 PM  Result Value Ref Range   Opiates POSITIVE (A) NONE DETECTED   Cocaine NONE DETECTED NONE DETECTED   Benzodiazepines POSITIVE (A) NONE DETECTED   Amphetamines NONE DETECTED NONE DETECTED   Tetrahydrocannabinol NONE DETECTED NONE DETECTED   Barbiturates NONE DETECTED NONE DETECTED  CBC with Differential     Status: Abnormal   Collection Time: 07/28/22  4:39 PM  Result Value Ref Range   WBC 11.1 (H) 4.0 - 10.5 K/uL   RBC 5.34 (H) 3.87 - 5.11 MIL/uL   Hemoglobin 17.0 (H) 12.0 - 15.0 g/dL   HCT 13.0 (H) 86.5 - 78.4 %   MCV 93.4 80.0 - 100.0 fL   MCH 31.8 26.0 - 34.0 pg   MCHC 34.1 30.0 - 36.0 g/dL   RDW 69.6 29.5 - 28.4 %   Platelets 168 150 - 400 K/uL   nRBC 0.0 0.0 - 0.2 %   Neutrophils Relative % 78 %   Neutro Abs 8.7 (H) 1.7 - 7.7 K/uL   Lymphocytes Relative 17 %   Lymphs Abs 1.9 0.7 - 4.0 K/uL   Monocytes Relative 4 %   Monocytes Absolute 0.4 0.1 - 1.0 K/uL   Eosinophils Relative 0 %   Eosinophils Absolute 0.0 0.0 - 0.5 K/uL   Basophils Relative 1 %   Basophils Absolute 0.1 0.0 - 0.1 K/uL   Immature Granulocytes 0 %   Abs Immature Granulocytes 0.02 0.00 - 0.07 K/uL  Comprehensive metabolic panel     Status: Abnormal   Collection Time: 07/28/22  4:39 PM  Result Value Ref Range   Sodium 139 135 - 145 mmol/L   Potassium 3.3 (L) 3.5 - 5.1 mmol/L   Chloride 100 98 - 111 mmol/L   CO2 27 22 - 32 mmol/L   Glucose, Bld 99 70 - 99 mg/dL   BUN 12 6 - 20 mg/dL   Creatinine, Ser 1.32 0.44 - 1.00 mg/dL   Calcium 9.7 8.9 - 44.0 mg/dL   Total Protein 8.2 (H) 6.5 - 8.1 g/dL   Albumin 4.3 3.5 - 5.0 g/dL   AST 65 (H) 15 - 41 U/L   ALT 169 (H) 0 - 44 U/L   Alkaline Phosphatase 644 (H) 38 - 126 U/L   Total Bilirubin 0.7 0.3 - 1.2 mg/dL   GFR, Estimated >10 >27 mL/min   Anion gap 12 5 - 15  I-stat chem 8, ED (not at [facility], [facility] or [facility])     Status: Abnormal   Collection Time: 07/28/22  5:07 PM  Result Value Ref Range   Sodium 141 135 - 145 mmol/L   Potassium  3.2 (L) 3.5 - 5.1 mmol/L   Chloride 101 98 - 111 mmol/L   BUN 11 6 - 20 mg/dL   Creatinine, Ser 2.53 0.44 -  1.00 mg/dL   Glucose, Bld 93 70 - 99 mg/dL   Calcium, Ion 1.61 0.96 - 1.40 mmol/L   TCO2 28 22 - 32 mmol/L   Hemoglobin 17.7 (H) 12.0 - 15.0 g/dL   HCT 04.5 (H) 40.9 - 81.1 %  Hepatic function panel     Status: Abnormal   Collection Time: 07/29/22  6:55 AM  Result Value Ref Range   Total Protein 6.3 (L) 6.5 - 8.1 g/dL   Albumin 3.3 (L) 3.5 - 5.0 g/dL   AST 75 (H) 15 - 41 U/L   ALT 130 (H) 0 - 44 U/L   Alkaline Phosphatase 558 (H) 38 - 126 U/L   Total Bilirubin 0.9 0.3 - 1.2 mg/dL   Bilirubin, Direct 0.2 0.0 - 0.2 mg/dL   Indirect Bilirubin 0.7 0.3 - 0.9 mg/dL  Ferritin     Status: None   Collection Time: 07/29/22  6:55 AM  Result Value Ref Range   Ferritin 150 11 - 307 ng/mL  TSH     Status: None   Collection Time: 07/29/22  6:55 AM  Result Value Ref Range   TSH 2.661 0.350 - 4.500 uIU/mL     MR Brain W and Wo Contrast  Result Date: 07/28/2022 CLINICAL DATA:  Multiple sclerosis.  Neurologic deficit. EXAM: MRI HEAD WITHOUT AND WITH CONTRAST TECHNIQUE: Multiplanar, multiecho pulse sequences of the brain and surrounding structures were obtained without and with intravenous contrast. CONTRAST:  4mL GADAVIST GADOBUTROL 1 MMOL/ML IV SOLN COMPARISON:  None Available. FINDINGS: Brain: No acute infarct, mass effect or extra-axial collection. No acute or chronic hemorrhage. There are numerous white matter lesions, predominantly affecting the pericallosal and periventricular white matter, in unchanged distribution. There are no contrast-enhancing lesions. Vascular: Major flow voids are preserved. Skull and upper cervical spine: Normal calvarium and skull base. Visualized upper cervical spine and soft tissues are normal. Sinuses/Orbits:No paranasal sinus fluid levels or advanced mucosal thickening. No mastoid or middle ear effusion. Normal orbits. IMPRESSION: Unchanged distribution of white  matter lesions consistent with multiple sclerosis. No active demyelination. Electronically Signed   By: Deatra Robinson M.D.   On: 07/28/2022 20:34   MR THORACIC SPINE W WO CONTRAST  Result Date: 07/28/2022 CLINICAL DATA:  Multiple sclerosis.  Myelopathy. EXAM: MRI CERVICAL, THORACIC AND LUMBAR SPINE WITHOUT AND WITH CONTRAST TECHNIQUE: Multiplanar and multiecho pulse sequences of the cervical spine, to include the craniocervical junction and cervicothoracic junction, and thoracic and lumbar spine, were obtained without and with intravenous contrast. CONTRAST:  4mL GADAVIST GADOBUTROL 1 MMOL/ML IV SOLN COMPARISON:  08/26/2021 FINDINGS: Markedly motion degraded study. MRI CERVICAL SPINE FINDINGS Alignment: Physiologic. Vertebrae: No fracture, evidence of discitis, or bone lesion. Cord: Unchanged appearance of parenchymal white matter lesions within the spinal cord at the C2-5 levels. No contrast-enhancing lesions. Posterior Fossa, vertebral arteries, paraspinal tissues: Negative. Disc levels: No spinal canal stenosis or neural impingement. MRI THORACIC SPINE FINDINGS Alignment:  Physiologic. Vertebrae: No fracture, evidence of discitis, or bone lesion. Cord: Axial images are markedly motion degraded but white matter lesions centered at the T5 and T6-7 levels are again noted. There is no abnormal contrast enhancement. Paraspinal and other soft tissues: Negative. Disc levels: No spinal canal stenosis. MRI LUMBAR SPINE FINDINGS Segmentation:  Standard. Alignment:  Physiologic. Vertebrae:  No fracture, evidence of discitis, or bone lesion. Conus medullaris and cauda equina: Conus extends to the L1 level. Conus and cauda equina appear normal. Paraspinal and other soft tissues: Negative. Disc levels: No spinal canal stenosis  or neural impingement. IMPRESSION: 1. Markedly motion degraded study. 2. Unchanged distribution of chronic demyelinating lesions within the cervical and thoracic spinal cord. No contrast-enhancing  lesions to suggest active demyelination. 3. No spinal canal stenosis or neural impingement. Electronically Signed   By: Deatra Robinson M.D.   On: 07/28/2022 20:27   MR Cervical Spine W and Wo Contrast  Result Date: 07/28/2022 CLINICAL DATA:  Multiple sclerosis.  Myelopathy. EXAM: MRI CERVICAL, THORACIC AND LUMBAR SPINE WITHOUT AND WITH CONTRAST TECHNIQUE: Multiplanar and multiecho pulse sequences of the cervical spine, to include the craniocervical junction and cervicothoracic junction, and thoracic and lumbar spine, were obtained without and with intravenous contrast. CONTRAST:  4mL GADAVIST GADOBUTROL 1 MMOL/ML IV SOLN COMPARISON:  08/26/2021 FINDINGS: Markedly motion degraded study. MRI CERVICAL SPINE FINDINGS Alignment: Physiologic. Vertebrae: No fracture, evidence of discitis, or bone lesion. Cord: Unchanged appearance of parenchymal white matter lesions within the spinal cord at the C2-5 levels. No contrast-enhancing lesions. Posterior Fossa, vertebral arteries, paraspinal tissues: Negative. Disc levels: No spinal canal stenosis or neural impingement. MRI THORACIC SPINE FINDINGS Alignment:  Physiologic. Vertebrae: No fracture, evidence of discitis, or bone lesion. Cord: Axial images are markedly motion degraded but white matter lesions centered at the T5 and T6-7 levels are again noted. There is no abnormal contrast enhancement. Paraspinal and other soft tissues: Negative. Disc levels: No spinal canal stenosis. MRI LUMBAR SPINE FINDINGS Segmentation:  Standard. Alignment:  Physiologic. Vertebrae:  No fracture, evidence of discitis, or bone lesion. Conus medullaris and cauda equina: Conus extends to the L1 level. Conus and cauda equina appear normal. Paraspinal and other soft tissues: Negative. Disc levels: No spinal canal stenosis or neural impingement. IMPRESSION: 1. Markedly motion degraded study. 2. Unchanged distribution of chronic demyelinating lesions within the cervical and thoracic spinal cord. No  contrast-enhancing lesions to suggest active demyelination. 3. No spinal canal stenosis or neural impingement. Electronically Signed   By: Deatra Robinson M.D.   On: 07/28/2022 20:27   MR Lumbar Spine W Wo Contrast  Result Date: 07/28/2022 CLINICAL DATA:  Multiple sclerosis.  Myelopathy. EXAM: MRI CERVICAL, THORACIC AND LUMBAR SPINE WITHOUT AND WITH CONTRAST TECHNIQUE: Multiplanar and multiecho pulse sequences of the cervical spine, to include the craniocervical junction and cervicothoracic junction, and thoracic and lumbar spine, were obtained without and with intravenous contrast. CONTRAST:  4mL GADAVIST GADOBUTROL 1 MMOL/ML IV SOLN COMPARISON:  08/26/2021 FINDINGS: Markedly motion degraded study. MRI CERVICAL SPINE FINDINGS Alignment: Physiologic. Vertebrae: No fracture, evidence of discitis, or bone lesion. Cord: Unchanged appearance of parenchymal white matter lesions within the spinal cord at the C2-5 levels. No contrast-enhancing lesions. Posterior Fossa, vertebral arteries, paraspinal tissues: Negative. Disc levels: No spinal canal stenosis or neural impingement. MRI THORACIC SPINE FINDINGS Alignment:  Physiologic. Vertebrae: No fracture, evidence of discitis, or bone lesion. Cord: Axial images are markedly motion degraded but white matter lesions centered at the T5 and T6-7 levels are again noted. There is no abnormal contrast enhancement. Paraspinal and other soft tissues: Negative. Disc levels: No spinal canal stenosis. MRI LUMBAR SPINE FINDINGS Segmentation:  Standard. Alignment:  Physiologic. Vertebrae:  No fracture, evidence of discitis, or bone lesion. Conus medullaris and cauda equina: Conus extends to the L1 level. Conus and cauda equina appear normal. Paraspinal and other soft tissues: Negative. Disc levels: No spinal canal stenosis or neural impingement. IMPRESSION: 1. Markedly motion degraded study. 2. Unchanged distribution of chronic demyelinating lesions within the cervical and thoracic  spinal cord. No contrast-enhancing lesions to suggest active demyelination.  3. No spinal canal stenosis or neural impingement. Electronically Signed   By: Deatra Robinson M.D.   On: 07/28/2022 20:27    ROS:  As stated above in the HPI otherwise negative.  Blood pressure 129/76, pulse 62, temperature 97.8 F (36.6 C), temperature source Oral, resp. rate 18, height 5\' 2"  (1.575 m), weight 39 kg, SpO2 100%.    PE: Gen: NAD, Alert and Oriented HEENT:  [place]/AT, EOMI Neck: Supple, no LAD Lungs: CTA Bilaterally CV: RRR without M/G/R ABD: Soft, NTND, +BS Ext: No C/C/E  Assessment/Plan: 1) Abnormal liver enzymes. 2) MS. 3) C282Y/H63D HFE mutation. 4) Polycythemia.   The source of her elevated liver enzymes is not known.  The HFE mutation should not cause an obstructive pattern with her liver enzymes.  Review of her liver enzymes does show that she had an increase in her AP in 09/02/2015, which predated her use of Tysabri.  It is unlikely that her liver enzymes are as a result of medications.  No prior GGT was drawn, but it is worthwhile to check and ensure that her elevated AP is from an hepatic source.  She does not complain about muscular pain to explain the elevation for her AST/ALT, ie, dermatomyositis.  The liver biopsy did not show granulomas to suggest sarcoidosis and there is no evidence of amyloidosis.  She states that she was not able to go to [facility] for a hepatology consultation as she does not have a ride.  Plan: 1) Check GGT. 2) Factionate AP. 3) [facility] will assume care in the AM. Adonijah Baena D 07/29/2022, 9:22 AM

## 2022-07-29 NOTE — ED Notes (Addendum)
EKG completed by Dan Humphreys NT at 2341. EKG given to Dr. Madilyn Hook. EKG is not uploading into epic at this time.

## 2022-07-29 NOTE — Evaluation (Signed)
Physical Therapy Evaluation Patient Details Name: Gabriela Campbell MRN: [id number] DOB: 05-09-1965 Today's Date: 07/29/2022  History of Present Illness  Pt is 57 yo female admitted on 07/28/22 with bil LE pain and weakness.  Pt with relapsing remitting MS but MRI with no new demyelinating lesions.  Current recommendations from neurology to monitor vit D, B12, and treat pain.  Pt with hx including but not limited to MS not currently on therapy due to ongoing abnormal liver function test, hereditary hemochromatosis, HTN, depression, and anxiety.  Clinical Impression  Pt admitted with above diagnosis. At baseline, pt is limited community ambulatory with RW.  She has some bil LE weakness/pain at baseline from MS but currently reports feeling 20% of her baseline.  She has DME and family support.  Today, pt ambulating 20' x 2 with min guard - she did have knee buckling in stance but compensates with RW.  Pt performing ADLs on her own.   Pt currently with functional limitations due to the deficits listed below (see PT Problem List). Pt will benefit from acute skilled PT to increase their independence and safety with mobility to allow discharge.  Additionally , recommend HHPT to further progress.          Assistance Recommended at Discharge Intermittent Supervision/Assistance  If plan is discharge home, recommend the following:  Can travel by private vehicle  A little help with walking and/or transfers;A little help with bathing/dressing/bathroom;Assistance with cooking/housework;Help with stairs or ramp for entrance        Equipment Recommendations None recommended by PT  Recommendations for Other Services       Functional Status Assessment Patient has had a recent decline in their functional status and demonstrates the ability to make significant improvements in function in a reasonable and predictable amount of time.     Precautions / Restrictions Precautions Precautions:  Fall Restrictions Weight Bearing Restrictions: No      Mobility  Bed Mobility Overal bed mobility: Modified Independent                  Transfers Overall transfer level: Needs assistance Equipment used: Rolling walker (2 wheels) Transfers: Sit to/from Stand Sit to Stand: Supervision           General transfer comment: Close supervision, stood from bed and toilet, performed toielting adls on her own    Ambulation/Gait Ambulation/Gait assistance: Min guard Gait Distance (Feet): 20 Feet (20'x2) Assistive device: Rolling walker (2 wheels) Gait Pattern/deviations: Step-through pattern, Knees buckling Gait velocity: decreased     General Gait Details: Ambulating to and from restroom with RW.  Pt with knees buckling in stance but compensating with RW.  Min guard for Wellsite geologist     Tilt Bed    Modified Rankin (Stroke Patients Only)       Balance Overall balance assessment: Needs assistance Sitting-balance support: No upper extremity supported Sitting balance-Leahy Scale: Good     Standing balance support: Bilateral upper extremity supported, Reliant on assistive device for balance Standing balance-Leahy Scale: Poor Standing balance comment: Pt requiring RW but more related to LE weakness.  When washing hands a sink - pt locks knees ext and leans against sink                             Pertinent Vitals/Pain Pain Assessment Pain Assessment: 0-10 Pain Score: 9  Pain Location: low back and legs Reports pain throughout anterior legs    Home Living Family/patient expects to be discharged to:: Private residence Living Arrangements: Parent Available Help at Discharge: Family;Available 24 hours/day Type of Home: House Home Access: Stairs to enter Entrance Stairs-Rails: None Entrance Stairs-Number of Steps: 4   Home Layout: One level Home Equipment: Pharmacist, hospital (2 wheels);Cane - single  point      Prior Function Prior Level of Function : Independent/Modified Independent             Mobility Comments: Pt ambulates with RW for short community distances only; reports multiple falls in last 6 months (legs give out) ADLs Comments: Reports independent adls; has assist with IADLs; does not drive     Hand Dominance        Extremity/Trunk Assessment   Upper Extremity Assessment Upper Extremity Assessment: RUE deficits/detail;LUE deficits/detail RUE Deficits / Details: ROM WFL; MMT 4/5 LUE Deficits / Details: ROM WFL; MMT 4/5    Lower Extremity Assessment Lower Extremity Assessment: RLE deficits/detail;LLE deficits/detail RLE Deficits / Details: ROM WFL; MMT 4/5; knees buckling with stance (compensates with RW) LLE Deficits / Details: ROM WFL; MMT 4/5;knees buckling with stance (compensates with RW)    Cervical / Trunk Assessment Cervical / Trunk Assessment: Normal  Communication   Communication: No difficulties  Cognition Arousal/Alertness: Awake/alert Behavior During Therapy: WFL for tasks assessed/performed Overall Cognitive Status: Within Functional Limits for tasks assessed                                          General Comments   Reports that she feels ~20% of her normal strength/mobility.  She states has equipment she needs at home and agreeable to HHPT at d/c.    Exercises     Assessment/Plan    PT Assessment Patient needs continued PT services  PT Problem List Decreased strength;Decreased mobility;Decreased activity tolerance;Decreased balance;Decreased knowledge of use of DME;Pain       PT Treatment Interventions DME instruction;Therapeutic activities;Modalities;Gait training;Therapeutic exercise;Patient/family education;Stair training;Balance training;Functional mobility training;Neuromuscular re-education    PT Goals (Current goals can be found in the Care Plan section)  Acute Rehab PT Goals Patient Stated Goal: regain  strenght, return home PT Goal Formulation: With patient Time For Goal Achievement: 08/12/22 Potential to Achieve Goals: Good    Frequency Min 1X/week     Co-evaluation               AM-PAC PT "6 Clicks" Mobility  Outcome Measure Help needed turning from your back to your side while in a flat bed without using bedrails?: A Little Help needed moving from lying on your back to sitting on the side of a flat bed without using bedrails?: A Little Help needed moving to and from a bed to a chair (including a wheelchair)?: A Little Help needed standing up from a chair using your arms (e.g., wheelchair or bedside chair)?: A Little Help needed to walk in hospital room?: A Little Help needed climbing 3-5 steps with a railing? : A Lot 6 Click Score: 17    End of Session Equipment Utilized During Treatment: Gait belt Activity Tolerance: Patient tolerated treatment well Patient left: in bed;with bed alarm set;with call bell/phone within reach Nurse Communication: Mobility status;Patient requests pain meds PT Visit Diagnosis: Other abnormalities of gait and mobility (R26.89);Muscle weakness (generalized) (M62.81);History of falling (Z91.81)  Time: 1610-9604 PT Time Calculation (min) (ACUTE ONLY): 18 min   Charges:   PT Evaluation $PT Eval Low Complexity: 1 Low   PT General Charges $$ ACUTE PT VISIT: 1 Visit         Anise Salvo, PT Acute Rehab [facility] [phone]   Rayetta Humphrey 07/29/2022, 11:41 AM

## 2022-07-30 ENCOUNTER — Telehealth: Payer: Self-pay

## 2022-07-30 DIAGNOSIS — K7689 Other specified diseases of liver: Secondary | ICD-10-CM

## 2022-07-30 DIAGNOSIS — R7989 Other specified abnormal findings of blood chemistry: Secondary | ICD-10-CM

## 2022-07-30 MED ORDER — VITAMIN B-12 1000 MCG PO TABS: 1000.0000 ug | ORAL_TABLET | Freq: Every day | ORAL | 0 refills | Status: AC

## 2022-07-30 MED ORDER — POLYETHYLENE GLYCOL 3350 17 G PO PACK: 17.0000 g | PACK | Freq: Every day | ORAL | 0 refills | Status: DC | PRN

## 2022-07-30 MED ORDER — OXYCODONE HCL 5 MG PO TABS: 5.0000 mg | ORAL_TABLET | Freq: Four times a day (QID) | ORAL | 0 refills | Status: DC | PRN

## 2022-07-30 NOTE — Telephone Encounter (Signed)
Hepatology referral sent to Dr. Audrie [facility] (F: [phone]). Contact person to follow up on referral Wille Celeste [phone]).  Follow up appointment has been scheduled with Dr. Adela Lank on Tuesday, 10/02/22 at 11 am. Appt information and referral information sent to patient via MyChart.

## 2022-07-30 NOTE — Discharge Summary (Signed)
Physician Discharge Summary  Gabriela Campbell ZOX:[id number] DOB: 04/18/1965 DOA: 07/28/2022  PCP: Pcp, No  Admit date: 07/28/2022 Discharge date: 07/30/2022  Time spent: 40 minutes  Recommendations for Outpatient Follow-up:  Follow outpatient CBC/CMP  Follow with neurology outpatient Follow with gastroenterology outpatient Consider heme follow up  Pain management per outpatient providers - she notes intolerances to NSAIDS, gabapentin, lyrica, etc.   Discharge Diagnoses:  Principal Problem:   Bilateral leg weakness Active Problems:   Severe low back pain   Relapsing remitting multiple sclerosis (HCC)   Elevated liver enzymes   Essential hypertension   Hypokalemia   Polycythemia   Hereditary hemochromatosis (HCC)   Discharge Condition: stable  Diet recommendation: heart healthy  Filed Weights   07/28/22 1420  Weight: 39 kg    History of present illness:   57 year old female with past medical history of relapsing remitting multiple sclerosis (follows with Dr. Terrace Arabia with [facility]) not currently on therapy due to ongoing abnormal liver function tests since 2021, hereditary hemochromatosis, hypertension, depression and anxiety disorder who presents to [facility] emergency department via EMS due to complaints of worsening bilateral lower extremity pain.   MRI did not show active demyelination.  Neurology recommended pain control, b12 supplementation for level >600 and outpatient neurology follow up.    GI was consulted for chronically abnormal LFT's.  GGT was elevated, fractionated alk phos pending at discharge.  She'll need continued GI follow up outpatient.   Hospital Course:  Assessment and Plan:  Bilateral leg weakness  Multiple Sclerosis Appreciate neurology assistance - he notes her symptoms of leg pain are chronic and have been going on for years, occasionally has exacerbation of her pain limiting ability to walk.  Neurology recommending aggressive  pain control, check B12 and vitamin D.  No need for inpatient treatment of MS, follow with outpatient neurology for long term recs. MRI brain without active demyelination, MRI lumbar spine, thoracic, cervical spine without active demyelination.  Motion degraded study.   B12 -> 535, supplement with goal >600 per neurology, vit D wnl  TSH wnl, CK pending   Chronic Low Back Pain and Lower Extremity Pain - pain in legs for years per patient, back for about 3 months - MRI lumbar spine without acute findings - avoiding APAP due to liver function abnormalities, she doesn't tolerate NSAIDs or gabapentin or lyrica.  Cymbalta as well she notes she hasn't tolerated.  I discussed I can send her home with Magalene Mclear few days of opiates, but she'll need to have outpatient provider take over prescription of this.  She'll likely need pain management.  Discussed complexities of chronic opiate therapy, I recommended trying to avoid this if possible.  Will discharge with short course of opiates. - PT/OT   Elevated liver enzymes Longstanding elevated liver enzymes since 2021 Patient has undergone extensive workup detailed in italics in HPI including liver biopsy 06/2020 and MRCP 05/2021. Patient follows with Dr. Adela Lank with [facility] gastroenterology Appreciate Dr. Haywood Pao recs - follow GGT (elevated), fractionated alk phos pending Follow with gastroenterology outpatient    Essential hypertension Continue home regimen of amlodipine   Hypokalemia Replacing with oral potassium chloride   Polycythemia Resolved on day of discharge, Consider follow outpatient with heme   Hereditary hemochromatosis [facility]) Patient has been evaluated by oncology in the past.  At that time they did not feel that the patient's hemochromatosis was contributing to her liver disease. Ferritin 150     Procedures: none   Consultations: GI neurology  Discharge Exam: Vitals:   07/30/22 0727 07/30/22 1100  BP: 134/72 118/83  Pulse:  65   Resp: 16 16  Temp: 98.2 F (36.8 C) 98.6 F (37 C)  SpO2:  100%   No complaints Pain is the same Encouraged outpatient follow up  General: No acute distress. Cardiovascular: RRR Lungs: unlabored Abdomen: Soft, nontender, nondistended  Neurological: Alert and oriented 3. Moves all extremities 4 with equal strength. Cranial nerves II through XII grossly intact. Extremities: No clubbing or cyanosis. No edema.   Discharge Instructions   Discharge Instructions     Call MD for:  difficulty breathing, headache or visual disturbances   Complete by: As directed    Call MD for:  extreme fatigue   Complete by: As directed    Call MD for:  hives   Complete by: As directed    Call MD for:  persistant dizziness or light-headedness   Complete by: As directed    Call MD for:  persistant nausea and vomiting   Complete by: As directed    Call MD for:  redness, tenderness, or signs of infection (pain, swelling, redness, odor or green/yellow discharge around incision site)   Complete by: As directed    Call MD for:  severe uncontrolled pain   Complete by: As directed    Call MD for:  temperature >100.4   Complete by: As directed    Diet - low sodium heart healthy   Complete by: As directed    Discharge instructions   Complete by: As directed    You were seen for back and leg pain and weakness we think is likely related to your chronic multiple sclerosis.  You'll need to follow up with neurology outpatient.  You'll also need to follow up with your gastroenterologist for your liver.  You did not have evidence of Chamara Dyck MS flare on imaging.  We'll send you home with pain meds to use as needed.  You can follow up with your outpatient doctors to see if these are appropriate medicines for you long term.  My recommendation is generally to avoid opiates for chronic pain and to try to limit use.  Please discuss with your outpatient providers.   We'll send you with vitamin b12  supplementation.  Your hemoglobin is elevated.  Follow labs with your PCP outpatient, it may be warranted for you to follow up with hematology outpatient at some time.  Return for new, recurrent, or worsening symptoms.  Please ask your PCP to request records from this hospitalization so they know what was done and what the next steps will be.   Increase activity slowly   Complete by: As directed       Allergies as of 07/30/2022       Reactions   Dilaudid [hydromorphone Hcl] Anaphylaxis   Fish-derived Products Anaphylaxis   Ibuprofen Anaphylaxis   Iodine Anaphylaxis   Penicillins Anaphylaxis   Has patient had Brekyn Huntoon PCN reaction causing immediate rash, facial/tongue/throat swelling, SOB or lightheadedness with hypotension:yes Has patient had Mal Asher PCN reaction causing severe rash involving mucus membranes or skin necrosis: no Has patient had Chennel Olivos PCN reaction that required hospitalization no Has patient had Burnard Enis PCN reaction occurring within the last 10 years: no If all of the above answers are "NO", then may proceed with Cephalosporin use.   Shellfish-derived Products Anaphylaxis   Shrimp Extract Anaphylaxis   Baclofen Other (See Comments)   Makes the patient feel wobbly   Gabapentin Other (See Comments)  GI upset   Codeine Nausea Only   Tramadol Nausea And Vomiting        Medication List     STOP taking these medications    baclofen 10 MG tablet Commonly known as: LIORESAL   DULoxetine 20 MG capsule Commonly known as: Cymbalta   DULoxetine 30 MG capsule Commonly known as: Cymbalta   potassium chloride SA 20 MEQ tablet Commonly known as: KLOR-CON M       TAKE these medications    amLODipine 5 MG tablet Commonly known as: NORVASC Take 1 tablet (5 mg total) by mouth daily.   cyanocobalamin 1000 MCG tablet Commonly known as: VITAMIN B12 Take 1 tablet (1,000 mcg total) by mouth daily. Follow with neurology outpatient for follow up labs   oxyCODONE 5 MG immediate  release tablet Commonly known as: Oxy IR/ROXICODONE Take 1-2 tablets (5-10 mg total) by mouth every 6 (six) hours as needed for up to 5 days.   polyethylene glycol 17 g packet Commonly known as: MIRALAX / GLYCOLAX Take 17 g by mouth daily as needed for mild constipation.   POTASSIUM PO Take 1 tablet by mouth daily.   Vitamin D3 25 MCG (1000 UT) Caps Take 2,000 Units by mouth daily.       Allergies  Allergen Reactions   Dilaudid [Hydromorphone Hcl] Anaphylaxis   Fish-Derived Products Anaphylaxis   Ibuprofen Anaphylaxis   Iodine Anaphylaxis   Penicillins Anaphylaxis    Has patient had Jobe Mutch PCN reaction causing immediate rash, facial/tongue/throat swelling, SOB or lightheadedness with hypotension:yes Has patient had Oliveah Zwack PCN reaction causing severe rash involving mucus membranes or skin necrosis: no Has patient had Wilna Pennie PCN reaction that required hospitalization no Has patient had Mccoy Testa PCN reaction occurring within the last 10 years: no If all of the above answers are "NO", then may proceed with Cephalosporin use.    Shellfish-Derived Products Anaphylaxis   Shrimp Extract Anaphylaxis   Baclofen Other (See Comments)    Makes the patient feel wobbly   Gabapentin Other (See Comments)    GI upset   Codeine Nausea Only   Tramadol Nausea And Vomiting    Follow-up Information     [facility] Follow up.   Specialty: Home Health Services Why: The home health agency will contact you for the first home visit Contact information: [address] [place] [address] [phone]                  The results of significant diagnostics from this hospitalization (including imaging, microbiology, ancillary and laboratory) are listed below for reference.    Significant Diagnostic Studies: MR Brain W and Wo Contrast  Result Date: 07/28/2022 CLINICAL DATA:  Multiple sclerosis.  Neurologic deficit. EXAM: MRI HEAD WITHOUT AND WITH CONTRAST TECHNIQUE: Multiplanar,  multiecho pulse sequences of the brain and surrounding structures were obtained without and with intravenous contrast. CONTRAST:  4mL GADAVIST GADOBUTROL 1 MMOL/ML IV SOLN COMPARISON:  None Available. FINDINGS: Brain: No acute infarct, mass effect or extra-axial collection. No acute or chronic hemorrhage. There are numerous white matter lesions, predominantly affecting the pericallosal and periventricular white matter, in unchanged distribution. There are no contrast-enhancing lesions. Vascular: Major flow voids are preserved. Skull and upper cervical spine: Normal calvarium and skull base. Visualized upper cervical spine and soft tissues are normal. Sinuses/Orbits:No paranasal sinus fluid levels or advanced mucosal thickening. No mastoid or middle ear effusion. Normal orbits. IMPRESSION: Unchanged distribution of white matter lesions consistent with multiple sclerosis. No  active demyelination. Electronically Signed   By: Deatra Robinson M.D.   On: 07/28/2022 20:34   MR THORACIC SPINE W WO CONTRAST  Result Date: 07/28/2022 CLINICAL DATA:  Multiple sclerosis.  Myelopathy. EXAM: MRI CERVICAL, THORACIC AND LUMBAR SPINE WITHOUT AND WITH CONTRAST TECHNIQUE: Multiplanar and multiecho pulse sequences of the cervical spine, to include the craniocervical junction and cervicothoracic junction, and thoracic and lumbar spine, were obtained without and with intravenous contrast. CONTRAST:  4mL GADAVIST GADOBUTROL 1 MMOL/ML IV SOLN COMPARISON:  08/26/2021 FINDINGS: Markedly motion degraded study. MRI CERVICAL SPINE FINDINGS Alignment: Physiologic. Vertebrae: No fracture, evidence of discitis, or bone lesion. Cord: Unchanged appearance of parenchymal white matter lesions within the spinal cord at the C2-5 levels. No contrast-enhancing lesions. Posterior Fossa, vertebral arteries, paraspinal tissues: Negative. Disc levels: No spinal canal stenosis or neural impingement. MRI THORACIC SPINE FINDINGS Alignment:  Physiologic.  Vertebrae: No fracture, evidence of discitis, or bone lesion. Cord: Axial images are markedly motion degraded but white matter lesions centered at the T5 and T6-7 levels are again noted. There is no abnormal contrast enhancement. Paraspinal and other soft tissues: Negative. Disc levels: No spinal canal stenosis. MRI LUMBAR SPINE FINDINGS Segmentation:  Standard. Alignment:  Physiologic. Vertebrae:  No fracture, evidence of discitis, or bone lesion. Conus medullaris and cauda equina: Conus extends to the L1 level. Conus and cauda equina appear normal. Paraspinal and other soft tissues: Negative. Disc levels: No spinal canal stenosis or neural impingement. IMPRESSION: 1. Markedly motion degraded study. 2. Unchanged distribution of chronic demyelinating lesions within the cervical and thoracic spinal cord. No contrast-enhancing lesions to suggest active demyelination. 3. No spinal canal stenosis or neural impingement. Electronically Signed   By: Deatra Robinson M.D.   On: 07/28/2022 20:27   MR Cervical Spine W and Wo Contrast  Result Date: 07/28/2022 CLINICAL DATA:  Multiple sclerosis.  Myelopathy. EXAM: MRI CERVICAL, THORACIC AND LUMBAR SPINE WITHOUT AND WITH CONTRAST TECHNIQUE: Multiplanar and multiecho pulse sequences of the cervical spine, to include the craniocervical junction and cervicothoracic junction, and thoracic and lumbar spine, were obtained without and with intravenous contrast. CONTRAST:  4mL GADAVIST GADOBUTROL 1 MMOL/ML IV SOLN COMPARISON:  08/26/2021 FINDINGS: Markedly motion degraded study. MRI CERVICAL SPINE FINDINGS Alignment: Physiologic. Vertebrae: No fracture, evidence of discitis, or bone lesion. Cord: Unchanged appearance of parenchymal white matter lesions within the spinal cord at the C2-5 levels. No contrast-enhancing lesions. Posterior Fossa, vertebral arteries, paraspinal tissues: Negative. Disc levels: No spinal canal stenosis or neural impingement. MRI THORACIC SPINE FINDINGS  Alignment:  Physiologic. Vertebrae: No fracture, evidence of discitis, or bone lesion. Cord: Axial images are markedly motion degraded but white matter lesions centered at the T5 and T6-7 levels are again noted. There is no abnormal contrast enhancement. Paraspinal and other soft tissues: Negative. Disc levels: No spinal canal stenosis. MRI LUMBAR SPINE FINDINGS Segmentation:  Standard. Alignment:  Physiologic. Vertebrae:  No fracture, evidence of discitis, or bone lesion. Conus medullaris and cauda equina: Conus extends to the L1 level. Conus and cauda equina appear normal. Paraspinal and other soft tissues: Negative. Disc levels: No spinal canal stenosis or neural impingement. IMPRESSION: 1. Markedly motion degraded study. 2. Unchanged distribution of chronic demyelinating lesions within the cervical and thoracic spinal cord. No contrast-enhancing lesions to suggest active demyelination. 3. No spinal canal stenosis or neural impingement. Electronically Signed   By: Deatra Robinson M.D.   On: 07/28/2022 20:27   MR Lumbar Spine W Wo Contrast  Result Date: 07/28/2022 CLINICAL DATA:  Multiple sclerosis.  Myelopathy. EXAM: MRI CERVICAL, THORACIC AND LUMBAR SPINE WITHOUT AND WITH CONTRAST TECHNIQUE: Multiplanar and multiecho pulse sequences of the cervical spine, to include the craniocervical junction and cervicothoracic junction, and thoracic and lumbar spine, were obtained without and with intravenous contrast. CONTRAST:  4mL GADAVIST GADOBUTROL 1 MMOL/ML IV SOLN COMPARISON:  08/26/2021 FINDINGS: Markedly motion degraded study. MRI CERVICAL SPINE FINDINGS Alignment: Physiologic. Vertebrae: No fracture, evidence of discitis, or bone lesion. Cord: Unchanged appearance of parenchymal white matter lesions within the spinal cord at the C2-5 levels. No contrast-enhancing lesions. Posterior Fossa, vertebral arteries, paraspinal tissues: Negative. Disc levels: No spinal canal stenosis or neural impingement. MRI THORACIC  SPINE FINDINGS Alignment:  Physiologic. Vertebrae: No fracture, evidence of discitis, or bone lesion. Cord: Axial images are markedly motion degraded but white matter lesions centered at the T5 and T6-7 levels are again noted. There is no abnormal contrast enhancement. Paraspinal and other soft tissues: Negative. Disc levels: No spinal canal stenosis. MRI LUMBAR SPINE FINDINGS Segmentation:  Standard. Alignment:  Physiologic. Vertebrae:  No fracture, evidence of discitis, or bone lesion. Conus medullaris and cauda equina: Conus extends to the L1 level. Conus and cauda equina appear normal. Paraspinal and other soft tissues: Negative. Disc levels: No spinal canal stenosis or neural impingement. IMPRESSION: 1. Markedly motion degraded study. 2. Unchanged distribution of chronic demyelinating lesions within the cervical and thoracic spinal cord. No contrast-enhancing lesions to suggest active demyelination. 3. No spinal canal stenosis or neural impingement. Electronically Signed   By: Deatra Robinson M.D.   On: 07/28/2022 20:27    Microbiology: No results found for this or any previous visit (from the past 240 hour(s)).   Labs: Basic Metabolic Panel: Recent Labs  Lab 07/28/22 1639 07/28/22 1707 07/30/22 0346  NA 139 141 137  K 3.3* 3.2* 3.9  CL 100 101 103  CO2 27  --  25  GLUCOSE 99 93 107*  BUN 12 11 10   CREATININE 0.76 0.70 0.76  CALCIUM 9.7  --  9.0   Liver Function Tests: Recent Labs  Lab 07/28/22 1639 07/29/22 0655 07/30/22 0346  AST 65* 75* 41  ALT 169* 130* 94*  ALKPHOS 644* 558* 462*  BILITOT 0.7 0.9 0.4  PROT 8.2* 6.3* 6.1*  ALBUMIN 4.3 3.3* 3.2*   No results for input(s): "LIPASE", "AMYLASE" in the last 168 hours. No results for input(s): "AMMONIA" in the last 168 hours. CBC: Recent Labs  Lab 07/28/22 1639 07/28/22 1707 07/30/22 0346  WBC 11.1*  --  7.8  NEUTROABS 8.7*  --  4.4  HGB 17.0* 17.7* 13.7  HCT 49.9* 52.0* 40.7  MCV 93.4  --  92.1  PLT 168  --  139*    Cardiac Enzymes: Recent Labs  Lab 07/29/22 0655 07/30/22 0346  CKTOTAL 33* 37*   BNP: BNP (last 3 results) No results for input(s): "BNP" in the last 8760 hours.  ProBNP (last 3 results) No results for input(s): "PROBNP" in the last 8760 hours.  CBG: No results for input(s): "GLUCAP" in the last 168 hours.     Signed:  Lacretia Nicks MD.  [facility] 07/30/2022, 1:45 PM

## 2022-07-30 NOTE — TOC Transition Note (Addendum)
Transition of Care [facility]) - CM/SW Discharge Note   Patient Details  Name: Gabriela Campbell MRN: [id number] Date of Birth: 07-20-1965  [facility] of [facility]) CM/SW Contact:  Kermit Balo, RN Phone Number: 07/30/2022, 11:46 AM   Clinical Narrative:    PcP: Dr Darleene Cleaver Pt is discharging home with her dad. She says he can provide supervision at home. DME at home; walker, shower seat Pt manages her own medications. She denies any issues.  Pt relies of friends to provide needed transportation. She will see if a friend can transport home when d/ced.  Home Therapy arranged with [facility]. Information on the AVS.    Final next level of care: Home w Home Health Services Barriers to Discharge: No Barriers Identified   Patient Goals and CMS Choice CMS Medicare.gov Compare Post Acute Care list provided to:: Patient Choice offered to / list presented to : Patient  Discharge Placement                         Discharge Plan and Services Additional resources added to the After Visit Summary for                            [facility] Arranged: PT [facility] Agency: [facility] Care Date [facility] Agency Contacted: 07/30/22   Representative spoke with at [facility] Agency: Kandee Keen  Social Determinants of Health (SDOH) Interventions SDOH Screenings   Depression (PHQ2-9): High Risk (10/25/2020)  Social Connections: Unknown (05/11/2021)   Received from [facility]  Tobacco Use: High Risk (06/15/2022)     Readmission Risk Interventions     No data to display

## 2022-07-30 NOTE — Significant Event (Signed)
Reviewed AVS with patient. All questions answered. Patient verbalize understanding of her medications. Per patient, she will call for her follow up appointments. AVS given to patient to take home. Has all belongings to take home with her. Awaiting on her transportation to arrive. Will transfer there by volunteer.

## 2022-07-30 NOTE — Telephone Encounter (Signed)
-----   Message from Benancio Deeds sent at 07/30/2022  1:56 PM EDT ----- Regarding: RE: Please set up a hospital follow up! Thanks! Thanks she can go wherever she would like for hepatology visit. Does [facility] have a local liver clinic here now? I was not aware of that. If so we can refer her there. Thanks ----- Message ----- From: Doree Albee, PA-C Sent: 07/30/2022   1:52 PM EDT To: Benancio Deeds, MD; Missy Sabins, RN Subject: Please set up a hospital follow up! Thanks!    Patient of Dr. Delorise Jackson follow up LFTs next 1-2 months.  Dr. Adela Lank, unable to go to [facility] due to not having a car, [facility] did not take her insurance.  Can we try new [facility] hepatology local clinic or hire that new doctor? =) Thanks, Marchelle Folks

## 2022-07-30 NOTE — Progress Notes (Signed)
Progress Note  Primary GI: Dr. Adela Lank DOA: 07/28/2022         Hospital Day: 3   Subjective  Chief Complaint: Abnormal liver  No family was present at the time of my evaluation. Patient she is doing well today, being prepared to be discharged home. No abdominal pain, fevers, pain that brought her in and lower extremities and back improved.   Objective   Vital signs in last 24 hours: Temp:  [98 F (36.7 C)-98.5 F (36.9 C)] 98.2 F (36.8 C) (07/29 0727) Pulse Rate:  [54-68] 54 (07/29 0400) Resp:  [16-18] 16 (07/29 0727) BP: (116-134)/(71-83) 134/72 (07/29 0727) SpO2:  [98 %-100 %] 98 % (07/29 0400)   Last BM recorded by nurses in past 5 days No data recorded  General: thin appearing,  female in no acute distress  Heart:  Regular rate and rhythm; no murmurs Pulm: Clear anteriorly; no wheezing Abdomen:  Soft, Non-distended AB, Active bowel sounds. No tenderness . Extremities:  without  edema. Neurologic:  Alert and  oriented x4;  No focal deficits.  Psych:  Cooperative. Normal mood and affect.  Intake/Output from previous day: 07/28 0701 - 07/29 0700 In: 1000 [I.V.:1000] Out: -  Intake/Output this shift: Total I/O In: 240 [P.O.:240] Out: -   Studies/Results: MR Brain W and Wo Contrast  Result Date: 07/28/2022 CLINICAL DATA:  Multiple sclerosis.  Neurologic deficit. EXAM: MRI HEAD WITHOUT AND WITH CONTRAST TECHNIQUE: Multiplanar, multiecho pulse sequences of the brain and surrounding structures were obtained without and with intravenous contrast. CONTRAST:  4mL GADAVIST GADOBUTROL 1 MMOL/ML IV SOLN COMPARISON:  None Available. FINDINGS: Brain: No acute infarct, mass effect or extra-axial collection. No acute or chronic hemorrhage. There are numerous white matter lesions, predominantly affecting the pericallosal and periventricular white matter, in unchanged distribution. There are no contrast-enhancing lesions. Vascular: Major flow voids are preserved. Skull and  upper cervical spine: Normal calvarium and skull base. Visualized upper cervical spine and soft tissues are normal. Sinuses/Orbits:No paranasal sinus fluid levels or advanced mucosal thickening. No mastoid or middle ear effusion. Normal orbits. IMPRESSION: Unchanged distribution of white matter lesions consistent with multiple sclerosis. No active demyelination. Electronically Signed   By: Deatra Robinson M.D.   On: 07/28/2022 20:34   MR THORACIC SPINE W WO CONTRAST  Result Date: 07/28/2022 CLINICAL DATA:  Multiple sclerosis.  Myelopathy. EXAM: MRI CERVICAL, THORACIC AND LUMBAR SPINE WITHOUT AND WITH CONTRAST TECHNIQUE: Multiplanar and multiecho pulse sequences of the cervical spine, to include the craniocervical junction and cervicothoracic junction, and thoracic and lumbar spine, were obtained without and with intravenous contrast. CONTRAST:  4mL GADAVIST GADOBUTROL 1 MMOL/ML IV SOLN COMPARISON:  08/26/2021 FINDINGS: Markedly motion degraded study. MRI CERVICAL SPINE FINDINGS Alignment: Physiologic. Vertebrae: No fracture, evidence of discitis, or bone lesion. Cord: Unchanged appearance of parenchymal white matter lesions within the spinal cord at the C2-5 levels. No contrast-enhancing lesions. Posterior Fossa, vertebral arteries, paraspinal tissues: Negative. Disc levels: No spinal canal stenosis or neural impingement. MRI THORACIC SPINE FINDINGS Alignment:  Physiologic. Vertebrae: No fracture, evidence of discitis, or bone lesion. Cord: Axial images are markedly motion degraded but white matter lesions centered at the T5 and T6-7 levels are again noted. There is no abnormal contrast enhancement. Paraspinal and other soft tissues: Negative. Disc levels: No spinal canal stenosis. MRI LUMBAR SPINE FINDINGS Segmentation:  Standard. Alignment:  Physiologic. Vertebrae:  No fracture, evidence of discitis, or bone lesion. Conus medullaris and cauda equina: Conus extends to the L1 level.  Conus and cauda equina appear  normal. Paraspinal and other soft tissues: Negative. Disc levels: No spinal canal stenosis or neural impingement. IMPRESSION: 1. Markedly motion degraded study. 2. Unchanged distribution of chronic demyelinating lesions within the cervical and thoracic spinal cord. No contrast-enhancing lesions to suggest active demyelination. 3. No spinal canal stenosis or neural impingement. Electronically Signed   By: Deatra Robinson M.D.   On: 07/28/2022 20:27   MR Cervical Spine W and Wo Contrast  Result Date: 07/28/2022 CLINICAL DATA:  Multiple sclerosis.  Myelopathy. EXAM: MRI CERVICAL, THORACIC AND LUMBAR SPINE WITHOUT AND WITH CONTRAST TECHNIQUE: Multiplanar and multiecho pulse sequences of the cervical spine, to include the craniocervical junction and cervicothoracic junction, and thoracic and lumbar spine, were obtained without and with intravenous contrast. CONTRAST:  4mL GADAVIST GADOBUTROL 1 MMOL/ML IV SOLN COMPARISON:  08/26/2021 FINDINGS: Markedly motion degraded study. MRI CERVICAL SPINE FINDINGS Alignment: Physiologic. Vertebrae: No fracture, evidence of discitis, or bone lesion. Cord: Unchanged appearance of parenchymal white matter lesions within the spinal cord at the C2-5 levels. No contrast-enhancing lesions. Posterior Fossa, vertebral arteries, paraspinal tissues: Negative. Disc levels: No spinal canal stenosis or neural impingement. MRI THORACIC SPINE FINDINGS Alignment:  Physiologic. Vertebrae: No fracture, evidence of discitis, or bone lesion. Cord: Axial images are markedly motion degraded but white matter lesions centered at the T5 and T6-7 levels are again noted. There is no abnormal contrast enhancement. Paraspinal and other soft tissues: Negative. Disc levels: No spinal canal stenosis. MRI LUMBAR SPINE FINDINGS Segmentation:  Standard. Alignment:  Physiologic. Vertebrae:  No fracture, evidence of discitis, or bone lesion. Conus medullaris and cauda equina: Conus extends to the L1 level. Conus and  cauda equina appear normal. Paraspinal and other soft tissues: Negative. Disc levels: No spinal canal stenosis or neural impingement. IMPRESSION: 1. Markedly motion degraded study. 2. Unchanged distribution of chronic demyelinating lesions within the cervical and thoracic spinal cord. No contrast-enhancing lesions to suggest active demyelination. 3. No spinal canal stenosis or neural impingement. Electronically Signed   By: Deatra Robinson M.D.   On: 07/28/2022 20:27   MR Lumbar Spine W Wo Contrast  Result Date: 07/28/2022 CLINICAL DATA:  Multiple sclerosis.  Myelopathy. EXAM: MRI CERVICAL, THORACIC AND LUMBAR SPINE WITHOUT AND WITH CONTRAST TECHNIQUE: Multiplanar and multiecho pulse sequences of the cervical spine, to include the craniocervical junction and cervicothoracic junction, and thoracic and lumbar spine, were obtained without and with intravenous contrast. CONTRAST:  4mL GADAVIST GADOBUTROL 1 MMOL/ML IV SOLN COMPARISON:  08/26/2021 FINDINGS: Markedly motion degraded study. MRI CERVICAL SPINE FINDINGS Alignment: Physiologic. Vertebrae: No fracture, evidence of discitis, or bone lesion. Cord: Unchanged appearance of parenchymal white matter lesions within the spinal cord at the C2-5 levels. No contrast-enhancing lesions. Posterior Fossa, vertebral arteries, paraspinal tissues: Negative. Disc levels: No spinal canal stenosis or neural impingement. MRI THORACIC SPINE FINDINGS Alignment:  Physiologic. Vertebrae: No fracture, evidence of discitis, or bone lesion. Cord: Axial images are markedly motion degraded but white matter lesions centered at the T5 and T6-7 levels are again noted. There is no abnormal contrast enhancement. Paraspinal and other soft tissues: Negative. Disc levels: No spinal canal stenosis. MRI LUMBAR SPINE FINDINGS Segmentation:  Standard. Alignment:  Physiologic. Vertebrae:  No fracture, evidence of discitis, or bone lesion. Conus medullaris and cauda equina: Conus extends to the L1  level. Conus and cauda equina appear normal. Paraspinal and other soft tissues: Negative. Disc levels: No spinal canal stenosis or neural impingement. IMPRESSION: 1. Markedly motion degraded study. 2. Unchanged distribution  of chronic demyelinating lesions within the cervical and thoracic spinal cord. No contrast-enhancing lesions to suggest active demyelination. 3. No spinal canal stenosis or neural impingement. Electronically Signed   By: Deatra Robinson M.D.   On: 07/28/2022 20:27    Lab Results: Recent Labs    07/28/22 1639 07/28/22 1707 07/30/22 0346  WBC 11.1*  --  7.8  HGB 17.0* 17.7* 13.7  HCT 49.9* 52.0* 40.7  PLT 168  --  139*   BMET Recent Labs    07/28/22 1639 07/28/22 1707 07/30/22 0346  NA 139 141 137  K 3.3* 3.2* 3.9  CL 100 101 103  CO2 27  --  25  GLUCOSE 99 93 107*  BUN 12 11 10   CREATININE 0.76 0.70 0.76  CALCIUM 9.7  --  9.0   LFT Recent Labs    07/29/22 0655 07/30/22 0346  PROT 6.3* 6.1*  ALBUMIN 3.3* 3.2*  AST 75* 41  ALT 130* 94*  ALKPHOS 558* 462*  BILITOT 0.9 0.4  BILIDIR 0.2  --   IBILI 0.7  --    PT/INR No results for input(s): "LABPROT", "INR" in the last 72 hours.   Scheduled Meds:  amLODipine  5 mg Oral Daily   cholecalciferol  2,000 Units Oral Daily   enoxaparin (LOVENOX) injection  30 mg Subcutaneous Q24H   nicotine  21 mg Transdermal Daily   Continuous Infusions:    Patient profile:   57 year old female with history of abnormal liver function followed by Dr. Adela Lank, hemochromatosis genetic testing heterozygous for H63D and C20 82Y, status post liver biopsy June 2022 chronic hepatitis portal inflammation ductal reaction, moderate fibrosis stage II or 3 out of 4 seen and not typical for hemochromatosis patient was referred to [facility],    Impression/Plan:   Elevated LFTs 07/27 Alk phos 644, AST 65, ALT 169, total bilirubin 0.7 07/28 Alk phos 558, AST 75, ALT 130, total bilirubin 0.9 07/29 Alk phos 462, AST 41, ALT 94, total  bilirubin 0.4 Thought potentially in part from Tysabri for MS which she has not been restarted on  serological negative workup 05/2020 negative alpha-1 treatment plasmin ANA ASMA AMA, 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  [facility] is not compatible with insurance, will discuss with Dr. Adela Lank, possibility of referring to local [facility] hepatology clinic. Will plan for follow-up in our office outpatient  MS Continue follow-up with neurology  Polycythemia Monitor  Principal Problem:   Bilateral leg weakness Active Problems:   Relapsing remitting multiple sclerosis (HCC)   Elevated liver enzymes   Hereditary hemochromatosis (HCC)   Severe low back pain   Essential hypertension   Hypokalemia   Polycythemia    LOS: 2 days   Doree Albee  07/30/2022, 12:14 PM

## 2022-07-30 NOTE — Plan of Care (Signed)

## 2022-08-01 ENCOUNTER — Ambulatory Visit: Payer: Medicare Other | Admitting: Neurology

## 2022-08-01 DIAGNOSIS — F419 Anxiety disorder, unspecified: Secondary | ICD-10-CM | POA: Diagnosis not present

## 2022-08-01 DIAGNOSIS — I10 Essential (primary) hypertension: Secondary | ICD-10-CM | POA: Diagnosis not present

## 2022-08-01 DIAGNOSIS — G8929 Other chronic pain: Secondary | ICD-10-CM | POA: Diagnosis not present

## 2022-08-01 DIAGNOSIS — F32A Depression, unspecified: Secondary | ICD-10-CM | POA: Diagnosis not present

## 2022-08-01 DIAGNOSIS — G35 Multiple sclerosis: Secondary | ICD-10-CM | POA: Diagnosis not present

## 2022-08-01 DIAGNOSIS — M545 Low back pain, unspecified: Secondary | ICD-10-CM | POA: Diagnosis not present

## 2022-08-01 DIAGNOSIS — E876 Hypokalemia: Secondary | ICD-10-CM | POA: Diagnosis not present

## 2022-08-01 DIAGNOSIS — Z9181 History of falling: Secondary | ICD-10-CM | POA: Diagnosis not present

## 2022-08-03 DIAGNOSIS — I10 Essential (primary) hypertension: Secondary | ICD-10-CM | POA: Diagnosis not present

## 2022-08-03 DIAGNOSIS — M6281 Muscle weakness (generalized): Secondary | ICD-10-CM | POA: Diagnosis not present

## 2022-08-03 DIAGNOSIS — G8929 Other chronic pain: Secondary | ICD-10-CM | POA: Diagnosis not present

## 2022-08-03 DIAGNOSIS — G35 Multiple sclerosis: Secondary | ICD-10-CM | POA: Diagnosis not present

## 2022-08-06 ENCOUNTER — Telehealth: Payer: Self-pay | Admitting: Neurology

## 2022-08-06 NOTE — Telephone Encounter (Signed)
..   Pt understands that although there may be some limitations with this type of visit, we will take all precautions to reduce any security or privacy concerns.  Pt understands that this will be treated like an in office visit and we will file with pt's insurance, and there may be a patient responsible charge related to this service. ? ?

## 2022-08-07 ENCOUNTER — Telehealth: Payer: Self-pay

## 2022-08-07 NOTE — Telephone Encounter (Signed)
Patient has been scheduled with Dr. Brooke Dare at [facility] in [place] on 09-05-22. Patient is aware of the appointment

## 2022-08-07 NOTE — Telephone Encounter (Signed)
error 

## 2022-08-08 ENCOUNTER — Telehealth: Payer: Self-pay

## 2022-08-08 ENCOUNTER — Telehealth (INDEPENDENT_AMBULATORY_CARE_PROVIDER_SITE_OTHER): Payer: Medicare PPO | Admitting: Neurology

## 2022-08-08 DIAGNOSIS — R269 Unspecified abnormalities of gait and mobility: Secondary | ICD-10-CM | POA: Diagnosis not present

## 2022-08-08 DIAGNOSIS — R748 Abnormal levels of other serum enzymes: Secondary | ICD-10-CM

## 2022-08-08 DIAGNOSIS — G35 Multiple sclerosis: Secondary | ICD-10-CM | POA: Diagnosis not present

## 2022-08-08 MED ORDER — DULOXETINE HCL 20 MG PO CPEP: 20.0000 mg | ORAL_CAPSULE | Freq: Every day | ORAL | 5 refills | Status: DC

## 2022-08-08 MED ORDER — DULOXETINE HCL 30 MG PO CPEP: 30.0000 mg | ORAL_CAPSULE | Freq: Every day | ORAL | 5 refills | Status: DC

## 2022-08-08 NOTE — Patient Instructions (Signed)
Start Cymbalta low-dose 20 mg daily for lower leg pain Keep follow-up appointment with hepatology.  Very important you make this appointment.  Would like for you to see Dr. Terrace Arabia in about 3 months.  If the Cymbalta is not helpful please let me know.

## 2022-08-08 NOTE — Telephone Encounter (Signed)
-----   Message from Glean Salvo sent at 08/08/2022  3:56 PM EDT ----- Can you guys get her in with Dr. Terrace Arabia in 3 months?

## 2022-08-08 NOTE — Telephone Encounter (Signed)
Unable to lvm 1st attempt to make f/u appt in 3 months as sarah requested

## 2022-08-08 NOTE — Progress Notes (Signed)
Virtual Visit via Video Note  I connected with Gabriela Campbell on 08/08/22 at  3:30 PM EDT by a video enabled telemedicine application and verified that I am speaking with the correct person using two identifiers.  Location: Patient: at her home Provider: in the office    I discussed the limitations of evaluation and management by telemedicine and the availability of in person appointments. The patient expressed understanding and agreed to proceed.  History of Present Illness: Gabriela Campbell 57 year old female, relapsing remitting multiple sclerosis,   Past medical history of hysterectomy in 2003  cholecystectomy in 1999,  long time smoker, pack a day     She was highly functional until June 2018, without clear triggers, she began to notice bilateral anterior thigh muscle achy pain, spasm, also noticed gradual onset gait abnormality, which has progressively worse over the past few months, recently she also noticed bilateral hands numbness tingling, clumsy of bilateral hand,   Initially she has mild low back pain, but no longer has significant low back pain, she denies neck pain, denies bowel bladder incontinence, she denies dysarthria, no diplopia no dysphagia.   Laboratory evaluation in February 2019, CBC showed elevated WBC of 15.3, hemoglobin of 16.6, liver functional test, BMP, negative troponin, CPK was 25, CMP showed elevated alkaline phosphate 156, UA showed evidence of bacteria, normal TSH, negative HIV,    MRI of cervical spine with without contrast at [facility] in March 2019 showed evidence of signal abnormality behind C3 and 4, with mild enhancement,   MRI of brain showed evidence of multiple area of abnormal T2/FLAIR hyperintensity signal in the white matter at both hemisphere predominantly periventricular, there was also T1 black holes, involving the lower pons, medulla, deep left cerebellar white matter, faint enhancement of at least one lesion in the left frontal  lobe, unusual bulbous appearing of the anterior, inferior third ventricle in the region of the infundibular recess, with partial ringlike enhancement, hyper intense signal in the hypothalamus, right greater than left, possible he also optic asthma, right greater than left,   MRI of thoracic spine showed abnormal signal behind C7 T1-T2, also behind T6, T7, T8, some enhancement of the upper cord abnormality.   CSF showed more than 5 oligoclonal banding, with normal total protein 31   Imaging findings along with her history spinal fluid testing confirmed the diagnosis of relapsing remitting multiple sclerosis,    Laboratory evaluations in March 2019, vitamin D level was decreased 24, normal or negative NMO antibody were negative,Lyme titer, ANA, copper, ferritin level was mildly elevated 183, CPK, TSH, C-reactive protein, folic acid, A1c was 5.5, HIV, B12, RPR,    She started [place] since April 2019,  , she tolerated very well, there was no significant side effect noticed, she continue have fatigue, taking Provigil 100 mg daily for her fatigue    But laboratory evaluation in February 2020 began to show significant abnormal liver functional test alkaline phosphate 523, AST 387, ALT 297, [name] has been on hold since then,   Over the years, she has been complains of low back pain, lower extremity deep achy pain,  we have tried many different medications, either ineffective or she cannot tolerate it, Cymbalta, gabapentin, nortriptyline, NSAIDs Lyrica, Trileptal   She was followed by our clinic intermittently, had extensive GI evaluation by Dr. Adela Lank for abnormal liver function, which has been ongoing since 2019, elevated alkaline phosphate fluctuating 100-700, AST ALT 63,  She had extensive evaluation to date, ultrasound in  May 2022 was normal, serology work-up with iron saturation of 56%, otherwise negative for clear etiology, hemochromatosis genetic testing revealed compound heterozygote for  H63D and C282Y genes, ferritin has been between 200-500 range, liver biopsy June 2022 showed chronic hepatitis with portal inflammation with ductular reaction, moderate fibrosis, differentiation is broad but clear-cut etiology was not found, staining was not consistent with typical hemochromatosis, was also seen by hematologist for evaluation, do not think that hemochromatosis is causing of her liver disease,   Magnetic resonance cholangio pancreatography, on May 19, 2021 showed prominence of the extra and center intrahepatic biliary tree with the common duct measuring 8 mm but with gentle tapering to the level of the ampulla, dilation of the duct is similar to prior ultrasound, failure reservoir effect post cholecystectomy, no hepatic imaging finding to suggest cyanosis, no suspicious hepatic lesion,  CMP in April 2023: Continued elevation of alkaline phosphate, AST 54, ALT 51, actually this is better than her baseline level    Reviewed most recent MRI of the brain with without contrast on May 19, 2021, white matter multiple sclerosis lesions, no contrast-enhancement,   Today she complains of fairly acute onset significant tingling, radiating to left left anterior lateral thigh, increased gait abnormality due to denies bowel and bladder incontinence, or upper extremity involvement  UPDATE Jan 22 2022: She is brought in by transportation today, alone at clinical visit, she lives with her father, he is down a child of her family, mother is at nursing home   She continued to decline slowly," feels irritated all the time", lost interest, does not do much at home, poor appetite, not sleeping well, worsening gait abnormality   Last laboratory evaluation was in April 2023: Abnormal liver functional test, elevated alkaline phosphate 385, AST 54, ALT 51  Update August 08, 2022 SS: Presented to the ER 07/28/2022 with a worsening bilateral lower extremity weakness and pain.  MRI of the brain showed unchanged  MS lesions, no acute demyelination.  MRI cervical, thoracic, lumbar spine without evidence of acute MS activity.  Vitamin D 51.  B12 535, neurology recommended greater than 600.  Alkaline phosphatase continued to be elevated 579.  AST 65, ALT 169.  UDS positive opiates and benzos.  Has outpatient GI follow-up, concerns for chronic hepatitis.  Multiple times referred to outpatient hepatology clinic could not go due to transportation.  Attempting local follow-up with [facility] hepatology, scheduled for September. She had no ride today, so switched to virtual. Her legs are weak, ache, unsteady. Her dad lives with her. She does her own ADLs. Cannot take gabapentin, makes her lightheaded. Lyrica was not helpful. Dr. Terrace Arabia ordered Cymbalta in Jan, she never filled it. PT coming out, in the past had made her pain worse. Has not started B12, waiting  for her check.    Observations/Objective: Via video visit at her home, is alert and oriented, provides her own history, speech is clear.  Moves slowly about with a rolling walker.  Posture is forward leaning, unsteady, tends to drag both feet.  Assessment and Plan: 1.  Relapsing remitting multiple sclerosis 2.  Gait abnormality 3.  Abnormal liver function test -MS diagnosed with symptoms of gait abnormality in 2018 on Tysabri April 2019-October 2021 stopped due to significantly elevated liver function test.  Extensive GI evaluation suggest potential chronic hepatitis, has not followed up with hepatology clinic due to transportation issue. Has follow-up in September 2024 -Recent hospitalization in July 2020 for a MRI brain, cervical spine, thoracic, lumbar spine  has not shown any indication of acute demyelination -Start Cymbalta 20 mg daily for bilateral lower extremity achy pain (previously tried and failed: Gabapentin, Lyrica, baclofen, tramadol), if not helpful consider referral to pain clinic. Only start low dose Cymbalta due to underlying liver issues,  -Will be  starting B12 supplementation at 1000 mcg daily, hospital neurologist recommended B12 levels greater than 600 (535)  Meds ordered this encounter  Medications   DISCONTD: DULoxetine (CYMBALTA) 30 MG capsule    Sig: Take 1 capsule (30 mg total) by mouth daily.    Dispense:  30 capsule    Refill:  5   DULoxetine (CYMBALTA) 20 MG capsule    Sig: Take 1 capsule (20 mg total) by mouth daily.    Dispense:  30 capsule    Refill:  5    Cancel the 30 mg dosing   Follow Up Instructions: 3 months with Dr. Terrace Arabia    I discussed the assessment and treatment plan with the patient. The patient was provided an opportunity to ask questions and all were answered. The patient agreed with the plan and demonstrated an understanding of the instructions.   The patient was advised to call back or seek an in-person evaluation if the symptoms worsen or if the condition fails to improve as anticipated.  Otila Kluver, DNP  [facility] [address] [place], [place] [address] [phone]

## 2022-08-10 DIAGNOSIS — G8929 Other chronic pain: Secondary | ICD-10-CM | POA: Diagnosis not present

## 2022-08-10 DIAGNOSIS — Z9181 History of falling: Secondary | ICD-10-CM | POA: Diagnosis not present

## 2022-08-10 DIAGNOSIS — M545 Low back pain, unspecified: Secondary | ICD-10-CM | POA: Diagnosis not present

## 2022-08-10 DIAGNOSIS — F419 Anxiety disorder, unspecified: Secondary | ICD-10-CM | POA: Diagnosis not present

## 2022-08-10 DIAGNOSIS — E876 Hypokalemia: Secondary | ICD-10-CM | POA: Diagnosis not present

## 2022-08-10 DIAGNOSIS — I10 Essential (primary) hypertension: Secondary | ICD-10-CM | POA: Diagnosis not present

## 2022-08-10 DIAGNOSIS — G35 Multiple sclerosis: Secondary | ICD-10-CM | POA: Diagnosis not present

## 2022-08-10 DIAGNOSIS — F32A Depression, unspecified: Secondary | ICD-10-CM | POA: Diagnosis not present

## 2022-08-13 NOTE — Progress Notes (Signed)
Chart reviewed, agree above plan ?

## 2022-08-14 DIAGNOSIS — F419 Anxiety disorder, unspecified: Secondary | ICD-10-CM | POA: Diagnosis not present

## 2022-08-14 DIAGNOSIS — G8929 Other chronic pain: Secondary | ICD-10-CM | POA: Diagnosis not present

## 2022-08-14 DIAGNOSIS — F32A Depression, unspecified: Secondary | ICD-10-CM | POA: Diagnosis not present

## 2022-08-14 DIAGNOSIS — G35 Multiple sclerosis: Secondary | ICD-10-CM | POA: Diagnosis not present

## 2022-08-14 DIAGNOSIS — I10 Essential (primary) hypertension: Secondary | ICD-10-CM | POA: Diagnosis not present

## 2022-08-14 DIAGNOSIS — M545 Low back pain, unspecified: Secondary | ICD-10-CM | POA: Diagnosis not present

## 2022-08-14 DIAGNOSIS — E876 Hypokalemia: Secondary | ICD-10-CM | POA: Diagnosis not present

## 2022-08-14 DIAGNOSIS — Z9181 History of falling: Secondary | ICD-10-CM | POA: Diagnosis not present

## 2022-08-16 DIAGNOSIS — I10 Essential (primary) hypertension: Secondary | ICD-10-CM | POA: Diagnosis not present

## 2022-08-16 DIAGNOSIS — G35 Multiple sclerosis: Secondary | ICD-10-CM | POA: Diagnosis not present

## 2022-08-16 DIAGNOSIS — E876 Hypokalemia: Secondary | ICD-10-CM | POA: Diagnosis not present

## 2022-08-16 DIAGNOSIS — Z9181 History of falling: Secondary | ICD-10-CM | POA: Diagnosis not present

## 2022-08-16 DIAGNOSIS — F32A Depression, unspecified: Secondary | ICD-10-CM | POA: Diagnosis not present

## 2022-08-16 DIAGNOSIS — G8929 Other chronic pain: Secondary | ICD-10-CM | POA: Diagnosis not present

## 2022-08-16 DIAGNOSIS — F419 Anxiety disorder, unspecified: Secondary | ICD-10-CM | POA: Diagnosis not present

## 2022-08-16 DIAGNOSIS — M545 Low back pain, unspecified: Secondary | ICD-10-CM | POA: Diagnosis not present

## 2022-08-20 ENCOUNTER — Encounter: Payer: Self-pay | Admitting: Physician Assistant

## 2022-08-22 DIAGNOSIS — Z9181 History of falling: Secondary | ICD-10-CM | POA: Diagnosis not present

## 2022-08-22 DIAGNOSIS — E876 Hypokalemia: Secondary | ICD-10-CM | POA: Diagnosis not present

## 2022-08-22 DIAGNOSIS — G35 Multiple sclerosis: Secondary | ICD-10-CM | POA: Diagnosis not present

## 2022-08-22 DIAGNOSIS — F419 Anxiety disorder, unspecified: Secondary | ICD-10-CM | POA: Diagnosis not present

## 2022-08-22 DIAGNOSIS — G8929 Other chronic pain: Secondary | ICD-10-CM | POA: Diagnosis not present

## 2022-08-22 DIAGNOSIS — M545 Low back pain, unspecified: Secondary | ICD-10-CM | POA: Diagnosis not present

## 2022-08-22 DIAGNOSIS — I10 Essential (primary) hypertension: Secondary | ICD-10-CM | POA: Diagnosis not present

## 2022-08-22 DIAGNOSIS — F32A Depression, unspecified: Secondary | ICD-10-CM | POA: Diagnosis not present

## 2022-08-23 ENCOUNTER — Encounter: Payer: Self-pay | Admitting: Physician Assistant

## 2022-08-24 ENCOUNTER — Encounter: Payer: Self-pay | Admitting: Physician Assistant

## 2022-08-29 DIAGNOSIS — F419 Anxiety disorder, unspecified: Secondary | ICD-10-CM | POA: Diagnosis not present

## 2022-08-29 DIAGNOSIS — G35 Multiple sclerosis: Secondary | ICD-10-CM | POA: Diagnosis not present

## 2022-08-29 DIAGNOSIS — I10 Essential (primary) hypertension: Secondary | ICD-10-CM | POA: Diagnosis not present

## 2022-08-29 DIAGNOSIS — G8929 Other chronic pain: Secondary | ICD-10-CM | POA: Diagnosis not present

## 2022-08-29 DIAGNOSIS — E876 Hypokalemia: Secondary | ICD-10-CM | POA: Diagnosis not present

## 2022-08-29 DIAGNOSIS — Z9181 History of falling: Secondary | ICD-10-CM | POA: Diagnosis not present

## 2022-08-29 DIAGNOSIS — F32A Depression, unspecified: Secondary | ICD-10-CM | POA: Diagnosis not present

## 2022-08-29 DIAGNOSIS — M545 Low back pain, unspecified: Secondary | ICD-10-CM | POA: Diagnosis not present

## 2022-09-04 DIAGNOSIS — I10 Essential (primary) hypertension: Secondary | ICD-10-CM | POA: Diagnosis not present

## 2022-09-04 DIAGNOSIS — D751 Secondary polycythemia: Secondary | ICD-10-CM | POA: Diagnosis not present

## 2022-09-04 DIAGNOSIS — Z7189 Other specified counseling: Secondary | ICD-10-CM | POA: Diagnosis not present

## 2022-09-04 DIAGNOSIS — M6281 Muscle weakness (generalized): Secondary | ICD-10-CM | POA: Diagnosis not present

## 2022-09-04 DIAGNOSIS — G35 Multiple sclerosis: Secondary | ICD-10-CM | POA: Diagnosis not present

## 2022-09-05 DIAGNOSIS — E876 Hypokalemia: Secondary | ICD-10-CM | POA: Diagnosis not present

## 2022-09-05 DIAGNOSIS — F419 Anxiety disorder, unspecified: Secondary | ICD-10-CM | POA: Diagnosis not present

## 2022-09-05 DIAGNOSIS — G35 Multiple sclerosis: Secondary | ICD-10-CM | POA: Diagnosis not present

## 2022-09-05 DIAGNOSIS — G8929 Other chronic pain: Secondary | ICD-10-CM | POA: Diagnosis not present

## 2022-09-05 DIAGNOSIS — I10 Essential (primary) hypertension: Secondary | ICD-10-CM | POA: Diagnosis not present

## 2022-09-05 DIAGNOSIS — Z9181 History of falling: Secondary | ICD-10-CM | POA: Diagnosis not present

## 2022-09-05 DIAGNOSIS — M545 Low back pain, unspecified: Secondary | ICD-10-CM | POA: Diagnosis not present

## 2022-09-05 DIAGNOSIS — F32A Depression, unspecified: Secondary | ICD-10-CM | POA: Diagnosis not present

## 2022-09-12 DIAGNOSIS — G8929 Other chronic pain: Secondary | ICD-10-CM | POA: Diagnosis not present

## 2022-09-12 DIAGNOSIS — M545 Low back pain, unspecified: Secondary | ICD-10-CM | POA: Diagnosis not present

## 2022-09-12 DIAGNOSIS — F32A Depression, unspecified: Secondary | ICD-10-CM | POA: Diagnosis not present

## 2022-09-12 DIAGNOSIS — I10 Essential (primary) hypertension: Secondary | ICD-10-CM | POA: Diagnosis not present

## 2022-09-12 DIAGNOSIS — F419 Anxiety disorder, unspecified: Secondary | ICD-10-CM | POA: Diagnosis not present

## 2022-09-12 DIAGNOSIS — Z9181 History of falling: Secondary | ICD-10-CM | POA: Diagnosis not present

## 2022-09-12 DIAGNOSIS — E876 Hypokalemia: Secondary | ICD-10-CM | POA: Diagnosis not present

## 2022-09-12 DIAGNOSIS — G35 Multiple sclerosis: Secondary | ICD-10-CM | POA: Diagnosis not present

## 2022-09-19 DIAGNOSIS — G35 Multiple sclerosis: Secondary | ICD-10-CM | POA: Diagnosis not present

## 2022-09-19 DIAGNOSIS — F209 Schizophrenia, unspecified: Secondary | ICD-10-CM | POA: Diagnosis not present

## 2022-09-19 DIAGNOSIS — I10 Essential (primary) hypertension: Secondary | ICD-10-CM | POA: Diagnosis not present

## 2022-09-19 DIAGNOSIS — Z0001 Encounter for general adult medical examination with abnormal findings: Secondary | ICD-10-CM | POA: Diagnosis not present

## 2022-09-21 DIAGNOSIS — E876 Hypokalemia: Secondary | ICD-10-CM | POA: Diagnosis not present

## 2022-09-21 DIAGNOSIS — Z9181 History of falling: Secondary | ICD-10-CM | POA: Diagnosis not present

## 2022-09-21 DIAGNOSIS — G35 Multiple sclerosis: Secondary | ICD-10-CM | POA: Diagnosis not present

## 2022-09-21 DIAGNOSIS — I10 Essential (primary) hypertension: Secondary | ICD-10-CM | POA: Diagnosis not present

## 2022-09-21 DIAGNOSIS — M545 Low back pain, unspecified: Secondary | ICD-10-CM | POA: Diagnosis not present

## 2022-09-21 DIAGNOSIS — G8929 Other chronic pain: Secondary | ICD-10-CM | POA: Diagnosis not present

## 2022-09-21 DIAGNOSIS — F32A Depression, unspecified: Secondary | ICD-10-CM | POA: Diagnosis not present

## 2022-09-21 DIAGNOSIS — F419 Anxiety disorder, unspecified: Secondary | ICD-10-CM | POA: Diagnosis not present

## 2022-09-25 ENCOUNTER — Encounter: Payer: Self-pay | Admitting: Physician Assistant

## 2022-10-02 ENCOUNTER — Ambulatory Visit: Payer: Medicare PPO | Admitting: Gastroenterology

## 2022-10-02 DIAGNOSIS — G8929 Other chronic pain: Secondary | ICD-10-CM | POA: Diagnosis not present

## 2022-10-02 DIAGNOSIS — M6281 Muscle weakness (generalized): Secondary | ICD-10-CM | POA: Diagnosis not present

## 2022-10-02 DIAGNOSIS — I10 Essential (primary) hypertension: Secondary | ICD-10-CM | POA: Diagnosis not present

## 2022-10-03 ENCOUNTER — Encounter (HOSPITAL_COMMUNITY): Payer: Self-pay

## 2022-10-03 ENCOUNTER — Emergency Department (HOSPITAL_COMMUNITY): Payer: Medicare PPO

## 2022-10-03 ENCOUNTER — Emergency Department (HOSPITAL_COMMUNITY)
Admission: EM | Admit: 2022-10-03 | Discharge: 2022-10-03 | Disposition: A | Discharge status: Home / Self Care | Payer: Medicare PPO | Attending: Emergency Medicine | Admitting: Emergency Medicine

## 2022-10-03 ENCOUNTER — Other Ambulatory Visit: Payer: Self-pay

## 2022-10-03 DIAGNOSIS — M545 Low back pain, unspecified: Secondary | ICD-10-CM | POA: Insufficient documentation

## 2022-10-03 DIAGNOSIS — S7002XA Contusion of left hip, initial encounter: Secondary | ICD-10-CM | POA: Insufficient documentation

## 2022-10-03 DIAGNOSIS — M47816 Spondylosis without myelopathy or radiculopathy, lumbar region: Secondary | ICD-10-CM | POA: Diagnosis not present

## 2022-10-03 DIAGNOSIS — M25552 Pain in left hip: Secondary | ICD-10-CM | POA: Diagnosis not present

## 2022-10-03 DIAGNOSIS — Z043 Encounter for examination and observation following other accident: Secondary | ICD-10-CM | POA: Diagnosis not present

## 2022-10-03 DIAGNOSIS — W19XXXA Unspecified fall, initial encounter: Secondary | ICD-10-CM | POA: Diagnosis not present

## 2022-10-03 DIAGNOSIS — M546 Pain in thoracic spine: Secondary | ICD-10-CM | POA: Diagnosis not present

## 2022-10-03 MED ORDER — OXYCODONE HCL 5 MG PO TABS
5.0000 mg | ORAL_TABLET | Freq: Four times a day (QID) | ORAL | 0 refills | Status: DC | PRN
Start: 2022-10-03 — End: 2023-02-14

## 2022-10-03 MED ORDER — OXYCODONE HCL 5 MG PO TABS
5.0000 mg | ORAL_TABLET | Freq: Once | ORAL | Status: AC
  Administered 2022-10-03: 5 mg via ORAL
  Filled 2022-10-03: qty 1

## 2022-10-03 MED ORDER — CYCLOBENZAPRINE HCL 10 MG PO TABS: 10.0000 mg | ORAL_TABLET | Freq: Two times a day (BID) | ORAL | 0 refills | Status: DC | PRN

## 2022-10-03 MED ORDER — CYCLOBENZAPRINE HCL 10 MG PO TABS
10.0000 mg | ORAL_TABLET | Freq: Once | ORAL | Status: AC
  Administered 2022-10-03: 10 mg via ORAL
  Filled 2022-10-03: qty 1

## 2022-10-03 MED ORDER — OXYCODONE-ACETAMINOPHEN 5-325 MG PO TABS
1.0000 | ORAL_TABLET | Freq: Once | ORAL | Status: AC
  Administered 2022-10-03: 1 via ORAL
  Filled 2022-10-03: qty 1

## 2022-10-03 NOTE — ED Provider Notes (Signed)
[facility]ERGENCY DEPARTMENT AT [facility] Provider Note   CSN: [id number] Arrival date & time: 10/03/22  1135     History  Chief Complaint  Patient presents with   Gabriela Campbell    Gabriela Campbell is a 57 y.o. female.  Patient is a 57 year old female with a history of MS, ongoing issues with gait and balance who uses a walker but reports she was going to her walker yesterday when her legs gave out and she fell on her back.  She did not hit her head or lose consciousness.  She is not having any neck pain or headaches.  However since the fall she has had significant pain in her left thigh, hip and back area.  It is worse when she tries to move her leg or when she attempts to walk with her walker.  She denies any pain on the right side.  She has had ongoing issues with this area in the past.  She has not taken any medication for pain.  The history is provided by the patient.  Fall       Home Medications Prior to Admission medications   Medication Sig Start Date End Date Taking? Authorizing Provider  cyclobenzaprine (FLEXERIL) 10 MG tablet Take 1 tablet (10 mg total) by mouth 2 (two) times daily as needed for muscle spasms. 10/03/22  Yes Tram Wrenn, Alphonzo Lemmings, MD  oxyCODONE (ROXICODONE) 5 MG immediate release tablet Take 1 tablet (5 mg total) by mouth every 6 (six) hours as needed for severe pain. 10/03/22  Yes Rashun Grattan, Alphonzo Lemmings, MD  amLODipine (NORVASC) 5 MG tablet Take 1 tablet (5 mg total) by mouth daily. 04/09/22   Derwood Kaplan, MD  Cholecalciferol (VITAMIN D3) 25 MCG (1000 UT) CAPS Take 2,000 Units by mouth daily.    [provider]  DULoxetine (CYMBALTA) 20 MG capsule Take 1 capsule (20 mg total) by mouth daily. 08/08/22   Glean Salvo, NP  polyethylene glycol (MIRALAX / GLYCOLAX) 17 g packet Take 17 g by mouth daily as needed for mild constipation. 07/30/22   Zigmund Daniel., MD  POTASSIUM PO Take 1 tablet by mouth daily.    [provider]       Allergies    Dilaudid [hydromorphone hcl], Fish-derived products, Ibuprofen, Iodine, Penicillins, Shellfish-derived products, Shrimp extract, Baclofen, Gabapentin, Codeine, and Tramadol    Review of Systems   Review of Systems  Physical Exam Updated Vital Signs BP 131/83 (BP Location: Right Arm)   Pulse 68   Temp 98.3 F (36.8 C) (Oral)   Resp 18   Ht 5\' 2"  (1.575 m)   Wt 43.1 kg   SpO2 99%   BMI 17.38 kg/m  Physical Exam Vitals and nursing note reviewed.  Constitutional:      General: She is not in acute distress.    Appearance: She is well-developed.  HENT:     Head: Normocephalic and atraumatic.  Eyes:     Pupils: Pupils are equal, round, and reactive to light.  Cardiovascular:     Rate and Rhythm: Normal rate and regular rhythm.     Heart sounds: Normal heart sounds. No murmur heard.    No friction rub.  Pulmonary:     Effort: Pulmonary effort is normal.     Breath sounds: Normal breath sounds. No wheezing or rales.  Abdominal:     General: Bowel sounds are normal. There is no distension.     Palpations: Abdomen is soft.  Tenderness: There is no abdominal tenderness. There is no guarding or rebound.  Musculoskeletal:        General: Tenderness present.     Lumbar back: Bony tenderness present. Normal range of motion.       Back:     Left hip: Tenderness present. No deformity. Decreased range of motion.     Comments: No edema.  Pain in the left leg when axillaly loading.  Lower extremity pulses intact  Skin:    General: Skin is warm and dry.     Findings: No rash.  Neurological:     Mental Status: She is alert and oriented to person, place, and time.     Cranial Nerves: No cranial nerve deficit.  Psychiatric:        Behavior: Behavior normal.     ED Results / Procedures / Treatments   Labs (all labs ordered are listed, but only abnormal results are displayed) Labs Reviewed - No data to display  EKG None  Radiology DG Lumbar Spine  Complete  Result Date: 10/03/2022 CLINICAL DATA:  Fall EXAM: LUMBAR SPINE - COMPLETE 4 VIEW COMPARISON:  MR L Spine 07/28/22 FINDINGS: 5 lumbar type vertebral bodies. Normal alignment. Vertebral body heights and disc spaces are preserved. Mild facet degenerative changes in the lower lumbar spine.Nonobstructive bowel gas pattern. Pelvic pheloboliths. Assessment of the sacrum is limtied due to overlying bowel gas. IMPRESSION: No acute fracture or traumatic listhesis. Electronically Signed   By: Lorenza Cambridge M.D.   On: 10/03/2022 13:37   DG Hip Unilat W or Wo Pelvis 2-3 Views Left  Result Date: 10/03/2022 CLINICAL DATA:  Left hip pain after fall yesterday. EXAM: DG HIP (WITH OR WITHOUT PELVIS) 2-3V LEFT COMPARISON:  None Available. FINDINGS: There is no evidence of hip fracture or dislocation. There is no evidence of arthropathy or other focal bone abnormality. IMPRESSION: Negative. Electronically Signed   By: Lupita Raider M.D.   On: 10/03/2022 13:37    Procedures Procedures    Medications Ordered in ED Medications  oxyCODONE-acetaminophen (PERCOCET/ROXICET) 5-325 MG per tablet 1 tablet (1 tablet Oral Given 10/03/22 1217)  cyclobenzaprine (FLEXERIL) tablet 10 mg (10 mg Oral Given 10/03/22 1431)  oxyCODONE (Oxy IR/ROXICODONE) immediate release tablet 5 mg (5 mg Oral Given 10/03/22 1431)    ED Course/ Medical Decision Making/ A&P                                 Medical Decision Making Amount and/or Complexity of Data Reviewed Radiology: ordered and independent interpretation performed. Decision-making details documented in ED Course.  Risk Prescription drug management.   Patient presenting today with ground-level fall yesterday with ongoing pain in her left mid femur hip and back.  Concern for fracture versus exacerbation of chronic pain she has had in the past.  She is neurovascularly intact at this time.  She does appear to have spasm as well.  Patient has multiple medication allergies  and has not taken anything at home for the pain. 3:34 PM I have independently visualized and interpreted pt's images today.  X-ray is negative for acute fracture of the hip or lumbar spine.  Patient is still having pain on exam but findings were discussed with her.  Will give additional pain control but at this time feel that patient should be able to be discharged today.  She is comfortable with this plan.  Final Clinical Impression(s) / ED Diagnoses Final diagnoses:  Fall, initial encounter  Contusion of left hip, initial encounter    Rx / DC Orders ED Discharge Orders          Ordered    oxyCODONE (ROXICODONE) 5 MG immediate release tablet  Every 6 hours PRN        10/03/22 1533    cyclobenzaprine (FLEXERIL) 10 MG tablet  2 times daily PRN        10/03/22 1533              Gwyneth Sprout, MD 10/03/22 1535

## 2022-10-03 NOTE — ED Triage Notes (Signed)
Patient presented to ER for fall yesterday. Patient was able to get herself up after fall, but today experiencing lower back and L thigh pain. Patient denies hitting head, denies LOC, states her leg just gave out. Patient does not take blood thinners.

## 2022-10-03 NOTE — Discharge Instructions (Signed)
No signs of broken bone today.  A short prescription for pain medication was sent to your pharmacy.  Make sure you are using a walker at all times but if you continue to have pain by next week you should follow-up with the orthopeadist for a recheck.

## 2022-10-10 DIAGNOSIS — R252 Cramp and spasm: Secondary | ICD-10-CM | POA: Diagnosis not present

## 2022-10-19 DIAGNOSIS — I10 Essential (primary) hypertension: Secondary | ICD-10-CM | POA: Diagnosis not present

## 2022-10-19 DIAGNOSIS — F209 Schizophrenia, unspecified: Secondary | ICD-10-CM | POA: Diagnosis not present

## 2022-10-19 DIAGNOSIS — J449 Chronic obstructive pulmonary disease, unspecified: Secondary | ICD-10-CM | POA: Diagnosis not present

## 2022-10-19 DIAGNOSIS — Z0001 Encounter for general adult medical examination with abnormal findings: Secondary | ICD-10-CM | POA: Diagnosis not present

## 2022-10-19 DIAGNOSIS — G35 Multiple sclerosis: Secondary | ICD-10-CM | POA: Diagnosis not present

## 2023-01-25 ENCOUNTER — Ambulatory Visit: Payer: Medicare PPO | Admitting: Gastroenterology

## 2023-01-25 NOTE — Progress Notes (Deleted)
HPI :  58 year old female here for a follow-up visit for abnormal liver function testing.  Recall that she was initially seen by Willette Cluster NP in April of this year for elevated liver enzymes.  Her liver enzymes have been elevated dating back to 2019.  Alk phos has fluctuated from 100s to 700s and AST ALT has reached 300s.  Last checked 1 month ago her alk phos was 400, AST 184, ALT 143, direct bilirubin normal.  She has had an extensive work-up to date.  Ultrasound in May 2022 was normal.  She had a serologic work-up which showed an iron saturation of 56% but otherwise negative for clear etiology otherwise.  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  Ferritin has been between 200s and 500s.  She is not immune to hepatitis A received 2 parts of the vaccination series so far.   Ultimately she underwent a liver biopsy at the end of June showing chronic hepatitis with portal inflammation with ductular reaction. Moderate fibrosis (stage 2-3 of 4).  Differential is broad but clear-cut etiology not seen.  Staining was not consistent with typical hemochromatosis.  She was seen by hematology for evaluation of not think that hemochromatosis is causing her liver disease.   Is any history of jaundice.  No family history of liver disease she is aware of.  No abdominal pains.  She really does not take any medications other than vitamin D.  Denies any herbal supplements or over-the-counter supplements.  She has been on Tysabri for history of multiple sclerosis but she is off this for the past year.  She denies any pains or problems with her bowel habits.  She has had a colonoscopy in the past but has been more than 10 years.  No family history of colon cancer.  No cardiopulmonary symptoms.   Given her persistent elevation with biopsy findings I referred her to hepatology here locally at Dr. Keane Police office.  Her insurance did not cover [facility] and consult was  declined.  I referred her to [facility] and they accepted her to be seen on August 30 but she cannot show up because she has no transportation of there.  She does not drive.  She is accompanied by a friend who is willing to drive her to our office today.     Data Reviewed:   ANA neg SMA neg Ceruloplasmin normal Alpha one antitrypsin normal IgG 868 Hep B / C negative AMA negative Celiac negative Iron sat 56%, iron 204, ferritin 252 --> ferritin 513   Not immune to hep B / A - vaccinated x 2 doses    compound heterozygote for H63D and C282Y genes   Liver biopsy 06/28/20   FINAL MICROSCOPIC DIAGNOSIS:   A. LIVER, NEEDLE CORE BIOPSY:  - Chronic hepatitis with portal inflammation with ductular reaction.  See comment  - Moderate fibrosis (stage 2-3 of 4)   COMMENT:   The biopsy is adequate for review. There is patchy portal inflammation  with mixed inflammatory infiltrates. There is evidence of lymphocytic  cholangitis.  Mild to moderate ductular reaction with associated  neutrophilic response is present.  Significant interface hepatitis is  not seen.  Hepatic lobules show minimal steatosis with regenerative  changes but significant inflammation or cholestasis is not seen.  Trichrome, PAS and reticulin stains show portal expansion with fibrous  septa that show focal bridging.  Some of the septa appear thinned and  dedicate, suggestive of regression from previous fibrosis.  Iron stain  shows mostly mild granular hepatocellular iron deposition (1-2+ of 4).  PAS-D stain shows no cytoplasmic inclusions.   The etiology of chronic hepatitis is uncertain.  There is mild iron  deposition. Diagnostic features of hemochromatosis are not seen but a  mild phenotype cannot be ruled out.  Differential diagnosis can include  includes autoimmune hepatitis, viral hepatitis, decreased  steatohepatitis and drug-induced liver injury.  Serologic and clinical  correlation, including patient's drug and over  the counter medications/  agents history is suggested.     58 year old female here for reassessment of the following:   Elevated liver enzymes History liver biopsy Colon cancer screening   Patient has a chronic hepatitis dating back a few years now with fluctuating levels in her alkaline phosphatase and AST/ALT.  As above serologic work-up done and without a clear cause.  She has been seen by hematology and liver biopsy is not consistent with typical hemochromatosis.  Her serologic work-up is negative for autoimmune hepatitis, she does not take any drugs at this time.  Tysabri can cause acute elevation in liver enzymes but would unlikely to cause this picture and she stopped drug one year ago.  I am worried that she has active inflammation on her biopsy with stage 2-3 fibrosis, at risk for cirrhosis and we discussed this at length and what that would entail.  In regards to further work-up at this time, I will recheck her LFTs to see where they are trending and send an IgG4 level to make sure normal.  I will also schedule her for an MRCP to rule out PSC and look at her biliary tree given persistent elevation in alk phos.  I do ultimately think she warrants hepatology evaluation at this point.  Difficult situation, her insurance will not cover hepatology locally but will allow her to see [facility] or [facility].  Unfortunately she has significant problems with transportation and missed her appointment at [facility].  I spoke with her friend today and if she plans it well in advance they could take her to [facility] if needed.  Of note patient is due for colon cancer screening but is not willing to pursue colonoscopy at this point and wants to defer this until her liver disease has been further evaluated   Plan: - hepatology referral again - refer to [facility] again, friend will try to help make her appointment. In the interim, I asked her ot call her insurance for an appeal to see Dr. Hamilton Capri locally - MRCP - rule out PSC - LFTs, IgG4  level - recommended optical colonoscopy, she declines colonoscopy right now    MRCP 05/19/21: IMPRESSION: Prominence of the extra and central intrahepatic biliary tree with the common duct measuring 8 mm but with gentle tapering to the level of the ampulla, dilation of the duct is similar to prior ultrasound May 10, 2020 and favored reservoir effect post cholecystectomy.   No peribiliary enhancement or dominant stricture.   No hepatic imaging findings to suggest cirrhosis. No suspicious hepatic lesion.    Seen back in the hospital summer 937:  58 year old female with relapsing and remitting MS and abnormal liver enzymes admitted for her MS.  She was noted to have persistently abnormal liver enzymes and a GI consultation was requested.  The patient had extensive work up with Dr. Adela Lank.  Initially her elevated liver enzymes were thought to be from Tysabri, but her liver enzymes did not improved off of the medication.  Extensive serologic work up  was performed, which was negative for autoimmune disease.  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  A liver biopsy was obtained and it showed portal inflammation with ductular reaction.  There was minimal steatosis and no evidence of cholestasis.  Iron deposition is consistent with her H282Y/H63D compound heterozygosity, but there was no evidence of iron overload.  She exhibited a 2-3 fibrosis score.  MRCP showed some prominence of the central intrahepatic biliary duct and the CBD measured 8 mm in the setting of a cholecystectomy state.  The patient does not report any abdominal pain.  Her neurologist did not restart her Tysabri.   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  [facility] is not compatible with insurance, will discuss with Dr. Adela Lank, possibility of referring to local [facility] hepatology clinic. Will plan for follow-up in our office outpatient        Past Medical History:  Diagnosis Date   Allergy    Multiple sclerosis (HCC)    Shingles    Weakness      Past Surgical History:  Procedure Laterality Date   ABDOMINAL HYSTERECTOMY     CHOLECYSTECTOMY     Family History  Problem Relation Age of Onset   Cancer Paternal Grandmother    Cancer Paternal Aunt    Cancer Paternal Aunt    Colon cancer Neg Hx    Stomach cancer Neg Hx    Esophageal cancer Neg Hx    Pancreatic cancer Neg Hx    Social History   Tobacco Use   Smoking status: Every Day    Current packs/day: 0.50    Types: Cigarettes   Smokeless tobacco: Never  Vaping Use   Vaping status: Never Used  Substance Use Topics   Alcohol use: No   Drug use: No   Current Outpatient Medications  Medication Sig Dispense Refill   amLODipine (NORVASC) 5 MG tablet Take 1 tablet (5 mg total) by mouth daily. 30 tablet 1   Cholecalciferol (VITAMIN D3) 25 MCG (1000 UT) CAPS Take 2,000 Units by mouth daily.     cyclobenzaprine (FLEXERIL) 10 MG tablet Take 1 tablet (10 mg total) by mouth 2 (two) times daily as needed for muscle spasms. 20 tablet 0   DULoxetine  (CYMBALTA) 20 MG capsule Take 1 capsule (20 mg total) by mouth daily. 30 capsule 5   oxyCODONE (ROXICODONE) 5 MG immediate release tablet Take 1 tablet (5 mg total) by mouth every 6 (six) hours as needed for severe pain. 10 tablet 0   polyethylene glycol (MIRALAX / GLYCOLAX) 17 g packet Take 17 g by mouth daily as needed for mild constipation. 14 each  0   POTASSIUM PO Take 1 tablet by mouth daily.     No current facility-administered medications for this visit.   Allergies  Allergen Reactions   Dilaudid [Hydromorphone Hcl] Anaphylaxis   Fish-Derived Products Anaphylaxis   Ibuprofen Anaphylaxis   Iodine Anaphylaxis   Penicillins Anaphylaxis    Has patient had a PCN reaction causing immediate rash, facial/tongue/throat swelling, SOB or lightheadedness with hypotension:yes Has patient had a PCN reaction causing severe rash involving mucus membranes or skin necrosis: no Has patient had a PCN reaction that required hospitalization no Has patient had a PCN reaction occurring within the last 10 years: no If all of the above answers are "NO", then may proceed with Cephalosporin use.    Shellfish-Derived Products Anaphylaxis   Shrimp Extract Anaphylaxis   Baclofen Other (See Comments)    Makes the patient feel wobbly   Gabapentin Other (See Comments)    GI upset   Codeine Nausea Only   Tramadol Nausea And Vomiting     Review of Systems: All systems reviewed and negative except where noted in HPI.    No results found.  Physical Exam: There were no vitals taken for this visit. Constitutional: Pleasant,well-developed, ***female in no acute distress. HEENT: Normocephalic and atraumatic. Conjunctivae are normal. No scleral icterus. Neck supple.  Cardiovascular: Normal rate, regular rhythm.  Pulmonary/chest: Effort normal and breath sounds normal. No wheezing, rales or rhonchi. Abdominal: Soft, nondistended, nontender. Bowel sounds active throughout. There are no masses palpable. No  hepatomegaly. Extremities: no edema Lymphadenopathy: No cervical adenopathy noted. Neurological: Alert and oriented to person place and time. Skin: Skin is warm and dry. No rashes noted. Psychiatric: Normal mood and affect. Behavior is normal.   ASSESSMENT: 58 y.o. female here for assessment of the following  No diagnosis found.  PLAN:   Annita Brod, MD

## 2023-02-13 ENCOUNTER — Observation Stay (HOSPITAL_COMMUNITY)
Admission: EM | Admit: 2023-02-13 | Discharge: 2023-02-16 | Disposition: A | Discharge status: Home / Self Care | Payer: Medicare PPO | Attending: Internal Medicine | Admitting: Internal Medicine

## 2023-02-13 ENCOUNTER — Encounter (HOSPITAL_COMMUNITY): Payer: Self-pay

## 2023-02-13 ENCOUNTER — Other Ambulatory Visit: Payer: Self-pay

## 2023-02-13 ENCOUNTER — Emergency Department (HOSPITAL_COMMUNITY): Payer: Medicare PPO

## 2023-02-13 DIAGNOSIS — K7400 Hepatic fibrosis, unspecified: Secondary | ICD-10-CM | POA: Insufficient documentation

## 2023-02-13 DIAGNOSIS — M6281 Muscle weakness (generalized): Secondary | ICD-10-CM | POA: Diagnosis not present

## 2023-02-13 DIAGNOSIS — G35 Multiple sclerosis: Secondary | ICD-10-CM | POA: Diagnosis not present

## 2023-02-13 DIAGNOSIS — R29898 Other symptoms and signs involving the musculoskeletal system: Secondary | ICD-10-CM | POA: Diagnosis not present

## 2023-02-13 DIAGNOSIS — I10 Essential (primary) hypertension: Secondary | ICD-10-CM | POA: Diagnosis not present

## 2023-02-13 DIAGNOSIS — Z79899 Other long term (current) drug therapy: Secondary | ICD-10-CM | POA: Diagnosis not present

## 2023-02-13 DIAGNOSIS — R2681 Unsteadiness on feet: Secondary | ICD-10-CM | POA: Insufficient documentation

## 2023-02-13 DIAGNOSIS — F17210 Nicotine dependence, cigarettes, uncomplicated: Secondary | ICD-10-CM | POA: Insufficient documentation

## 2023-02-13 DIAGNOSIS — Z66 Do not resuscitate: Secondary | ICD-10-CM | POA: Diagnosis not present

## 2023-02-13 DIAGNOSIS — N3000 Acute cystitis without hematuria: Secondary | ICD-10-CM

## 2023-02-13 DIAGNOSIS — F39 Unspecified mood [affective] disorder: Secondary | ICD-10-CM | POA: Insufficient documentation

## 2023-02-13 DIAGNOSIS — R7401 Elevation of levels of liver transaminase levels: Secondary | ICD-10-CM

## 2023-02-13 DIAGNOSIS — R7989 Other specified abnormal findings of blood chemistry: Secondary | ICD-10-CM | POA: Diagnosis present

## 2023-02-13 DIAGNOSIS — R531 Weakness: Secondary | ICD-10-CM | POA: Diagnosis present

## 2023-02-13 DIAGNOSIS — N390 Urinary tract infection, site not specified: Secondary | ICD-10-CM | POA: Diagnosis not present

## 2023-02-13 DIAGNOSIS — R202 Paresthesia of skin: Secondary | ICD-10-CM | POA: Insufficient documentation

## 2023-02-13 DIAGNOSIS — R932 Abnormal findings on diagnostic imaging of liver and biliary tract: Secondary | ICD-10-CM

## 2023-02-13 LAB — CBC WITH DIFFERENTIAL/PLATELET
Abs Immature Granulocytes: 0.05 10*3/uL (ref 0.00–0.07)
Basophils Absolute: 0.1 10*3/uL (ref 0.0–0.1)
Basophils Relative: 0 %
Eosinophils Absolute: 0.1 10*3/uL (ref 0.0–0.5)
Eosinophils Relative: 1 %
HCT: 46.4 % — ABNORMAL HIGH (ref 36.0–46.0)
Hemoglobin: 15.7 g/dL — ABNORMAL HIGH (ref 12.0–15.0)
Immature Granulocytes: 0 %
Lymphocytes Relative: 16 %
Lymphs Abs: 2.3 10*3/uL (ref 0.7–4.0)
MCH: 32.2 pg (ref 26.0–34.0)
MCHC: 33.8 g/dL (ref 30.0–36.0)
MCV: 95.3 fL (ref 80.0–100.0)
Monocytes Absolute: 0.7 10*3/uL (ref 0.1–1.0)
Monocytes Relative: 5 %
Neutro Abs: 10.8 10*3/uL — ABNORMAL HIGH (ref 1.7–7.7)
Neutrophils Relative %: 78 %
Platelets: 194 10*3/uL (ref 150–400)
RBC: 4.87 MIL/uL (ref 3.87–5.11)
RDW: 13.5 % (ref 11.5–15.5)
WBC: 13.9 10*3/uL — ABNORMAL HIGH (ref 4.0–10.5)
nRBC: 0 % (ref 0.0–0.2)

## 2023-02-13 LAB — BASIC METABOLIC PANEL
Anion gap: 12 (ref 5–15)
BUN: 16 mg/dL (ref 6–20)
CO2: 25 mmol/L (ref 22–32)
Calcium: 9.5 mg/dL (ref 8.9–10.3)
Chloride: 102 mmol/L (ref 98–111)
Creatinine, Ser: 0.67 mg/dL (ref 0.44–1.00)
GFR, Estimated: >60 mL/min (ref >60)
Glucose, Bld: 103 mg/dL — ABNORMAL HIGH (ref 70–99)
Potassium: 3.8 mmol/L (ref 3.5–5.1)
Sodium: 139 mmol/L (ref 135–145)

## 2023-02-13 LAB — CK: Total CK: 61 U/L (ref 38–234)

## 2023-02-13 MED ORDER — GADOBUTROL 1 MMOL/ML IV SOLN
4.0000 mL | Freq: Once | INTRAVENOUS | Status: AC | PRN
  Administered 2023-02-13: 4 mL via INTRAVENOUS

## 2023-02-13 MED ORDER — MORPHINE SULFATE (PF) 4 MG/ML IV SOLN
4.0000 mg | Freq: Once | INTRAVENOUS | Status: AC
  Administered 2023-02-13: 4 mg via INTRAVENOUS
  Filled 2023-02-13: qty 1

## 2023-02-13 MED ORDER — LORAZEPAM 2 MG/ML IJ SOLN
1.0000 mg | Freq: Once | INTRAMUSCULAR | Status: AC
  Administered 2023-02-13: 1 mg via INTRAVENOUS
  Filled 2023-02-13: qty 1

## 2023-02-13 MED ORDER — ONDANSETRON HCL 4 MG/2ML IJ SOLN
4.0000 mg | Freq: Once | INTRAMUSCULAR | Status: AC
  Administered 2023-02-13: 4 mg via INTRAVENOUS
  Filled 2023-02-13: qty 2

## 2023-02-13 NOTE — ED Notes (Signed)
Patient transported to MRI

## 2023-02-13 NOTE — ED Provider Notes (Signed)
[place] [facility]GENC[facility] DEPARTMENT AT [facility] Provider Note   CSN: [id number] Arrival date & time: 02/13/23  1639     History  Chief Complaint  Patient presents with   Multiple Sclerosis   Weakness    Gabriela Campbell is a 58 y.o. female hx of MS, elevated LFT not currently on any medication here for evaluation of weakness.  States she typically walks with a walker at baseline.  She woke up this morning and had diffuse pain to her bilateral legs as well as pain throughout her entire spine.  She feels like her "walk is off."  She states she feels like she has to "drag" her right leg.  She denies any recent falls or injuries.  No fever, nausea, vomiting chest pain, shortness of breath, abdominal pain.  States her extremities will "spasm."  States she was at her baseline yesterday.  Feels globally weak however no focal numbness  HPI     Home Medications Prior to Admission medications   Medication Sig Start Date End Date Taking? Authorizing Provider  amLODipine (NORVASC) 5 MG tablet Take 1 tablet (5 mg total) by mouth daily. 04/09/22   Derwood Kaplan, MD  Cholecalciferol (VITAMIN D3) 25 MCG (1000 UT) CAPS Take 2,000 Units by mouth daily.    [provider]  cyclobenzaprine (FLEXERIL) 10 MG tablet Take 1 tablet (10 mg total) by mouth 2 (two) times daily as needed for muscle spasms. 10/03/22   Gwyneth Sprout, MD  DULoxetine (CYMBALTA) 20 MG capsule Take 1 capsule (20 mg total) by mouth daily. 08/08/22   Glean Salvo, NP  oxyCODONE (ROXICODONE) 5 MG immediate release tablet Take 1 tablet (5 mg total) by mouth every 6 (six) hours as needed for severe pain. 10/03/22   Gwyneth Sprout, MD  polyethylene glycol (MIRALAX / GLYCOLAX) 17 g packet Take 17 g by mouth daily as needed for mild constipation. 07/30/22   Zigmund Daniel., MD  POTASSIUM PO Take 1 tablet by mouth daily.    [provider]      Allergies    Dilaudid [hydromorphone hcl], Fish-derived  products, Ibuprofen, Iodine, Penicillins, Shellfish-derived products, Shrimp extract, Baclofen, Gabapentin, Codeine, and Tramadol    Review of Systems   Review of Systems  Constitutional: Negative.   HENT: Negative.    Respiratory: Negative.    Cardiovascular: Negative.   Gastrointestinal: Negative.   Genitourinary: Negative.   Musculoskeletal:  Positive for back pain and myalgias.  Skin: Negative.   Neurological:  Positive for dizziness and weakness. Negative for tremors, seizures, syncope, facial asymmetry, speech difficulty, light-headedness, numbness and headaches.  All other systems reviewed and are negative.   Physical Exam Updated Vital Signs BP 116/81   Pulse 85   Temp 98.1 F (36.7 C) (Oral)   Resp 16   Ht 5\' 2"  (1.575 m)   Wt 43.1 kg   SpO2 97%   BMI 17.38 kg/m  Physical Exam Vitals and nursing note reviewed.  Constitutional:      General: She is not in acute distress.    Appearance: She is well-developed. She is not ill-appearing, toxic-appearing or diaphoretic.  HENT:     Head: Atraumatic.     Mouth/Throat:     Mouth: Mucous membranes are moist.  Eyes:     Pupils: Pupils are equal, round, and reactive to light.  Cardiovascular:     Rate and Rhythm: Normal rate.     Pulses: Normal pulses.     Heart sounds:  Normal heart sounds.  Pulmonary:     Effort: Pulmonary effort is normal. No respiratory distress.     Breath sounds: Normal breath sounds.  Abdominal:     General: Bowel sounds are normal. There is no distension.     Palpations: Abdomen is soft.  Musculoskeletal:        General: Normal range of motion.     Cervical back: Normal range of motion.     Comments: Diffuse tenderness throughout entire midline spine on palpation.  Compartments soft.  Skin:    General: Skin is warm and dry.     Capillary Refill: Capillary refill takes less than 2 seconds.  Neurological:     Mental Status: She is alert.     Sensory: Sensation is intact.     Motor:  Weakness present. No tremor.     Gait: Gait abnormal.     Comments: Cranial nerves II through XII grossly intact Equal handgrip bilaterally Lift left leg off bed by about 6 inches only able to lift right leg bed by about movements.  Unable to hold against resistance.  Hyperreflexic at bilateral patella Unable to place any weight to lower extremities  Psychiatric:        Mood and Affect: Mood normal.     ED Results / Procedures / Treatments   Labs (all labs ordered are listed, but only abnormal results are displayed) Labs Reviewed  CBC WITH DIFFERENTIAL/PLATELET - Abnormal; Notable for the following components:      Result Value   WBC 13.9 (*)    Hemoglobin 15.7 (*)    HCT 46.4 (*)    Neutro Abs 10.8 (*)    All other components within normal limits  BASIC METABOLIC PANEL - Abnormal; Notable for the following components:   Glucose, Bld 103 (*)    All other components within normal limits  CK  VITAMIN B12  URINALYSIS, W/ REFLEX TO CULTURE (INFECTION SUSPECTED)    EKG None  Radiology No results found.  Procedures Procedures    Medications Ordered in ED Medications  gadobutrol (GADAVIST) 1 MMOL/ML injection 4 mL (has no administration in time range)  morphine (PF) 4 MG/ML injection 4 mg (4 mg Intravenous Given 02/13/23 2203)  ondansetron (ZOFRAN) injection 4 mg (4 mg Intravenous Given 02/13/23 2202)  LORazepam (ATIVAN) injection 1 mg (1 mg Intravenous Given 02/13/23 2223)    ED Course/ Medical Decision Making/ A&P   58 year old history of MS not currently on any medication here for evaluation of weakness and pain.  Pain throughout her entire spine.  She feels like her gait is off.  She states unable to place pressure on her right leg and feels like she has to "drag" her right leg.  Denies any recent falls or injuries.  Typically ambulates with a walker and is able to complete her ADLs at home.  No chest pain, flank pain, abdominal pain, numbness.  She does feel globally  weak.  Also feels this.  Seems to be hyperreflexic at her patellas bilaterally.  Has difficulty raising her right leg off bed however question is this is mostly due to pain versus actual weakness.  She denies any bowel or bladder incontinence, saddle paresthesia.  Sounds like they have also been dealing with low B12 levels, at her last neurology note.  Will plan on labs, imaging and reassess  Labs and imaging personally viewed and interpreted:  CBC leukocytosis 13.9 Metabolic panel without significant abnormality CK 61  Care transferred to [facility] [place] who  will follow-up on remaining labs and imaging.  Disposition per oncoming provider                               Medical Decision Making Amount and/or Complexity of Data Reviewed External Data Reviewed: labs, radiology and notes. Labs: ordered. Decision-making details documented in ED Course. Radiology: ordered and independent interpretation performed. Decision-making details documented in ED Course.  Risk OTC drugs. Prescription drug management. Parenteral controlled substances. Decision regarding hospitalization. Diagnosis or treatment significantly limited by social determinants of health.          Final Clinical Impression(s) / ED Diagnoses Final diagnoses:  Multiple sclerosis (HCC)  Weakness    Rx / DC Orders ED Discharge Orders     None         Nicolasa Milbrath A, PA-C 02/13/23 2330    Linwood Dibbles, MD 02/18/23 [phone]

## 2023-02-13 NOTE — ED Triage Notes (Signed)
Pt biba for MS flare. Does not takes meds for it. Reports increasing leg weakness, right leg. Unable to walk today

## 2023-02-14 ENCOUNTER — Observation Stay (HOSPITAL_COMMUNITY): Payer: Medicare PPO

## 2023-02-14 ENCOUNTER — Emergency Department (HOSPITAL_COMMUNITY): Payer: Medicare PPO

## 2023-02-14 DIAGNOSIS — G35 Multiple sclerosis: Secondary | ICD-10-CM | POA: Diagnosis not present

## 2023-02-14 DIAGNOSIS — N390 Urinary tract infection, site not specified: Secondary | ICD-10-CM

## 2023-02-14 DIAGNOSIS — N3000 Acute cystitis without hematuria: Secondary | ICD-10-CM | POA: Diagnosis not present

## 2023-02-14 DIAGNOSIS — R7989 Other specified abnormal findings of blood chemistry: Secondary | ICD-10-CM

## 2023-02-14 DIAGNOSIS — I10 Essential (primary) hypertension: Secondary | ICD-10-CM

## 2023-02-14 DIAGNOSIS — R932 Abnormal findings on diagnostic imaging of liver and biliary tract: Secondary | ICD-10-CM | POA: Diagnosis not present

## 2023-02-14 LAB — CBC
HCT: 43.7 % (ref 36.0–46.0)
Hemoglobin: 14.3 g/dL (ref 12.0–15.0)
MCH: 31.6 pg (ref 26.0–34.0)
MCHC: 32.7 g/dL (ref 30.0–36.0)
MCV: 96.5 fL (ref 80.0–100.0)
Platelets: 193 10*3/uL (ref 150–400)
RBC: 4.53 MIL/uL (ref 3.87–5.11)
RDW: 13.6 % (ref 11.5–15.5)
WBC: 16.7 10*3/uL — ABNORMAL HIGH (ref 4.0–10.5)
nRBC: 0 % (ref 0.0–0.2)

## 2023-02-14 LAB — BASIC METABOLIC PANEL
Anion gap: 9 (ref 5–15)
BUN: 17 mg/dL (ref 6–20)
CO2: 26 mmol/L (ref 22–32)
Calcium: 9.2 mg/dL (ref 8.9–10.3)
Chloride: 101 mmol/L (ref 98–111)
Creatinine, Ser: 0.67 mg/dL (ref 0.44–1.00)
GFR, Estimated: >60 mL/min (ref >60)
Glucose, Bld: 106 mg/dL — ABNORMAL HIGH (ref 70–99)
Potassium: 3.9 mmol/L (ref 3.5–5.1)
Sodium: 136 mmol/L (ref 135–145)

## 2023-02-14 LAB — URINALYSIS, W/ REFLEX TO CULTURE (INFECTION SUSPECTED)
Bilirubin Urine: NEGATIVE
Glucose, UA: NEGATIVE mg/dL
Ketones, ur: NEGATIVE mg/dL
Nitrite: NEGATIVE
Protein, ur: 30 mg/dL — AB
Specific Gravity, Urine: 1.012 (ref 1.005–1.030)
WBC, UA: >50 WBC/hpf (ref 0–5)
pH: 7 (ref 5.0–8.0)

## 2023-02-14 LAB — HEPATIC FUNCTION PANEL
ALT: 373 U/L — ABNORMAL HIGH (ref 0–44)
AST: 849 U/L — ABNORMAL HIGH (ref 15–41)
Albumin: 3.4 g/dL — ABNORMAL LOW (ref 3.5–5.0)
Alkaline Phosphatase: 524 U/L — ABNORMAL HIGH (ref 38–126)
Bilirubin, Direct: 0.5 mg/dL — ABNORMAL HIGH (ref 0.0–0.2)
Indirect Bilirubin: 0.5 mg/dL (ref 0.3–0.9)
Total Bilirubin: 1 mg/dL (ref 0.0–1.2)
Total Protein: 7 g/dL (ref 6.5–8.1)

## 2023-02-14 LAB — PHOSPHORUS: Phosphorus: 4.3 mg/dL (ref 2.5–4.6)

## 2023-02-14 LAB — IRON AND TIBC
Iron: 187 ug/dL — ABNORMAL HIGH (ref 28–170)
Saturation Ratios: 49 % — ABNORMAL HIGH (ref 10.4–31.8)
TIBC: 381 ug/dL (ref 250–450)
UIBC: 194 ug/dL

## 2023-02-14 LAB — FERRITIN: Ferritin: 1481 ng/mL — ABNORMAL HIGH (ref 11–307)

## 2023-02-14 LAB — HEPATITIS A ANTIBODY, TOTAL: hep A Total Ab: REACTIVE — AB

## 2023-02-14 LAB — HEPATITIS C ANTIBODY: HCV Ab: NONREACTIVE

## 2023-02-14 LAB — MAGNESIUM: Magnesium: 2.1 mg/dL (ref 1.7–2.4)

## 2023-02-14 LAB — PROTIME-INR
INR: 1 (ref 0.8–1.2)
Prothrombin Time: 13.3 s (ref 11.4–15.2)

## 2023-02-14 LAB — VITAMIN B12: Vitamin B-12: 3118 pg/mL — ABNORMAL HIGH (ref 180–914)

## 2023-02-14 LAB — HEPATITIS B SURFACE ANTIGEN: Hepatitis B Surface Ag: NONREACTIVE

## 2023-02-14 MED ORDER — THIAMINE MONONITRATE 100 MG PO TABS
100.0000 mg | ORAL_TABLET | Freq: Every day | ORAL | Status: DC
  Administered 2023-02-14 – 2023-02-16 (×3): 100 mg via ORAL
  Filled 2023-02-14 (×4): qty 1

## 2023-02-14 MED ORDER — POLYETHYLENE GLYCOL 3350 17 G PO PACK: 17.0000 g | PACK | Freq: Every day | ORAL | Status: DC | PRN

## 2023-02-14 MED ORDER — LORAZEPAM 2 MG/ML IJ SOLN
1.0000 mg | INTRAMUSCULAR | Status: DC | PRN
  Administered 2023-02-14: 1 mg via INTRAVENOUS
  Filled 2023-02-14: qty 1

## 2023-02-14 MED ORDER — OXYCODONE HCL 5 MG PO TABS
10.0000 mg | ORAL_TABLET | Freq: Once | ORAL | Status: AC
  Administered 2023-02-14: 10 mg via ORAL
  Filled 2023-02-14: qty 2

## 2023-02-14 MED ORDER — AMLODIPINE BESYLATE 5 MG PO TABS
5.0000 mg | ORAL_TABLET | Freq: Every day | ORAL | Status: DC
Start: 2023-02-14 — End: 2023-02-16
  Administered 2023-02-16: 5 mg via ORAL
  Filled 2023-02-14 (×4): qty 1

## 2023-02-14 MED ORDER — OXYCODONE HCL 5 MG PO TABS
5.0000 mg | ORAL_TABLET | ORAL | Status: DC | PRN
  Administered 2023-02-14 – 2023-02-16 (×10): 5 mg via ORAL
  Filled 2023-02-14 (×10): qty 1

## 2023-02-14 MED ORDER — GADOBUTROL 1 MMOL/ML IV SOLN
4.0000 mL | Freq: Once | INTRAVENOUS | Status: AC | PRN
  Administered 2023-02-14: 4 mL via INTRAVENOUS

## 2023-02-14 MED ORDER — CAPSAICIN 0.025 % EX CREA
TOPICAL_CREAM | Freq: Two times a day (BID) | CUTANEOUS | Status: DC
  Filled 2023-02-14: qty 60

## 2023-02-14 MED ORDER — MELATONIN 3 MG PO TABS: 6.0000 mg | ORAL_TABLET | Freq: Every evening | ORAL | Status: DC | PRN

## 2023-02-14 MED ORDER — ACETAMINOPHEN 500 MG PO TABS
1000.0000 mg | ORAL_TABLET | Freq: Four times a day (QID) | ORAL | Status: DC | PRN
  Filled 2023-02-14: qty 2

## 2023-02-14 MED ORDER — OXYCODONE HCL 5 MG PO TABS: 2.5000 mg | ORAL_TABLET | ORAL | Status: DC | PRN

## 2023-02-14 MED ORDER — LIDOCAINE 5 % EX PTCH
1.0000 | MEDICATED_PATCH | CUTANEOUS | Status: DC
  Administered 2023-02-14 – 2023-02-16 (×3): 1 via TRANSDERMAL
  Filled 2023-02-14 (×3): qty 1

## 2023-02-14 MED ORDER — FOSFOMYCIN TROMETHAMINE 3 G PO PACK
3.0000 g | PACK | Freq: Once | ORAL | Status: AC
  Administered 2023-02-14: 3 g via ORAL
  Filled 2023-02-14: qty 3

## 2023-02-14 MED ORDER — AMLODIPINE BESYLATE 5 MG PO TABS: 2.5000 mg | ORAL_TABLET | Freq: Every day | ORAL | 1 refills | Status: DC

## 2023-02-14 MED ORDER — METHOCARBAMOL 500 MG PO TABS
500.0000 mg | ORAL_TABLET | Freq: Three times a day (TID) | ORAL | Status: DC | PRN
  Administered 2023-02-15 (×2): 500 mg via ORAL
  Filled 2023-02-14 (×3): qty 1

## 2023-02-14 MED ORDER — ENOXAPARIN SODIUM 30 MG/0.3ML IJ SOSY
30.0000 mg | PREFILLED_SYRINGE | INTRAMUSCULAR | Status: DC
  Administered 2023-02-14: 30 mg via SUBCUTANEOUS
  Filled 2023-02-14 (×2): qty 0.3

## 2023-02-14 NOTE — Care Management Obs Status (Signed)
MEDICARE OBSERVATION STATUS NOTIFICATION   Patient Details  Name: Gabriela Campbell MRN: [id number] Date of Birth: 1965-08-23   Medicare Observation Status Notification Given:  Yes    Lavenia Atlas, RN 02/14/2023, 1:22 PM

## 2023-02-14 NOTE — H&P (Addendum)
History and Physical    [name] GNF:[id number] DOB: 04-02-1965 DOA: 02/13/2023  PCP: Pcp, No   Patient coming from: Home   Chief Complaint:  Chief Complaint  Patient presents with   Multiple Sclerosis   Weakness    HPI:  Gabriela Campbell is a 58 y.o. female with hx of relapsing remitting multiple sclerosis, hemochromatosis, abnormal LFT, hypertension, mood disorder, who presented due to worsening weakness and sensory changes in her lower extremities. Also with chronic low back and lower ext pain which is worse. Reports that these changes have been ongoing for the past 3 to 4 days.  Describes weakness in her hip flexors and around the knee, paresthesias along the anterior thighs, worsening numbness of both feet.  Also has paresthesias in both hands.  Otherwise no history of headache, speech change, vision changes, head injury.  Denies any dysuria or recent urinary changes.  Did have an episode of vomiting about 2 weeks ago but no other GI symptoms at this time.  No fevers.    Review of Systems:  ROS complete and negative except as marked above   Allergies  Allergen Reactions   Dilaudid [Hydromorphone Hcl] Anaphylaxis   Fish-Derived Products Anaphylaxis   Ibuprofen Anaphylaxis   Iodine Anaphylaxis   Penicillins Anaphylaxis    Has patient had a PCN reaction causing immediate rash, facial/tongue/throat swelling, SOB or lightheadedness with hypotension:yes Has patient had a PCN reaction causing severe rash involving mucus membranes or skin necrosis: no Has patient had a PCN reaction that required hospitalization no Has patient had a PCN reaction occurring within the last 10 years: no If all of the above answers are "NO", then may proceed with Cephalosporin use.    Shellfish-Derived Products Anaphylaxis   Shrimp Extract Anaphylaxis   Baclofen Other (See Comments)    Makes the patient feel wobbly   Gabapentin Other (See Comments)    GI upset   Codeine Nausea Only    Tramadol Nausea And Vomiting    Prior to Admission medications   Medication Sig Start Date End Date Taking? Authorizing Provider  amLODipine (NORVASC) 5 MG tablet Take 1 tablet (5 mg total) by mouth daily. 04/09/22  Yes Derwood Kaplan, MD  Cholecalciferol (VITAMIN D3) 25 MCG (1000 UT) CAPS Take 2,000 Units by mouth daily.   Yes [provider]  cyclobenzaprine (FLEXERIL) 10 MG tablet Take 1 tablet (10 mg total) by mouth 2 (two) times daily as needed for muscle spasms. 10/03/22  Yes Gwyneth Sprout, MD  thiamine (VITAMIN B-1) 100 MG tablet Take 100 mg by mouth daily.   Yes [provider]  oxyCODONE (ROXICODONE) 5 MG immediate release tablet Take 1 tablet (5 mg total) by mouth every 6 (six) hours as needed for severe pain. Patient not taking: Reported on 02/14/2023 10/03/22   Gwyneth Sprout, MD    Past Medical History:  Diagnosis Date   Allergy    Multiple sclerosis (HCC)    Shingles    Weakness     Past Surgical History:  Procedure Laterality Date   ABDOMINAL HYSTERECTOMY     CHOLECYSTECTOMY       reports that she has been smoking cigarettes. She has never used smokeless tobacco. She reports that she does not drink alcohol and does not use drugs.  Family History  Problem Relation Age of Onset   Cancer Paternal Grandmother    Cancer Paternal Aunt    Cancer Paternal Aunt    Colon cancer Neg Hx  Stomach cancer Neg Hx    Esophageal cancer Neg Hx    Pancreatic cancer Neg Hx      Physical Exam: Vitals:   02/14/23 0300 02/14/23 0400 02/14/23 0522 02/14/23 0731  BP: 104/75 102/69  109/72  Pulse: 79 80  84  Resp:    18  Temp:   (!) 97.5 F (36.4 C) (!) 97.5 F (36.4 C)  TempSrc:   Oral Oral  SpO2: 97% 97%  96%  Weight:      Height:        Gen: Awake, alert, NAD   CV: Regular, normal S1, S2, no murmurs  Resp: Normal WOB, CTAB  Abd: Flat, normoactive, nontender MSK: Symmetric, no edema  Skin: No rashes or lesions to exposed skin  Neuro: Alert  and interactive, CN II through XII intact, motor is 5 out of 5 throughout the right side, there is 4/5 weakness with elbow extension on the L otherwise 5/5 in the LUE. In the LLE there is 4/5 strength throughout, with 3/5 strength with knee extension. Sensation is intact to fine touch throughout the upper ext, in the lower ext has hypoesthesia in stocking like distribution below the ankle. DTR 3+ patellar.  Psych: euthymic, appropriate    Data review:   Labs reviewed, notable for:   WBC 13 B12 within normal, CK within normal limits UA grossly consistent with infection  Micro:  Results for orders placed or performed during the hospital encounter of 04/16/17  Gram stain     Status: None   Collection Time: 04/16/17 11:43 AM   Specimen: Lumbar Puncture; Cerebrospinal Fluid  Result Value Ref Range Status   MICRO NUMBER: [id number]  Final   SPECIMEN QUALITY: ADEQUATE  Final   Source CEREBROSPINAL FLUID (CSF)  Final   STATUS: FINAL  Final   GRAM STAIN:   Final    Gram stain prepared by cytospin No white blood cells seen No organisms seen  Fungus culture w smear     Status: None   Collection Time: 04/16/17 11:43 AM   Specimen: Lumbar Puncture; Cerebrospinal Fluid  Result Value Ref Range Status   MICRO NUMBER: [id number]  Final   SPECIMEN QUALITY: ADEQUATE  Final   Source: CEREBROSPINAL FLUID  Final   STATUS: FINAL  Final   SMEAR: No fungal elements seen.  Final   CULTURE: No fungus isolated  Final    Imaging reviewed:  DG Chest 2 View Result Date: 02/14/2023 CLINICAL DATA:  Elevated white blood cell count. EXAM: CHEST - 2 VIEW COMPARISON:  05/07/2022 FINDINGS: Biapical scarring, stable. Heart is normal size. No acute confluent opacities or effusions. No acute bony abnormality. IMPRESSION: No active cardiopulmonary disease. Electronically Signed   By: Charlett Nose M.D.   On: 02/14/2023 01:23   MR Brain W and Wo Contrast Result Date: 02/14/2023 EXAM: MRI HEAD WITHOUT AND WITH CONTRAST MRI  CERVICAL SPINE WITHOUT AND WITH CONTRAST MRI CERVICAL THORACIC WITHOUT AND WITH CONTRAST MRI LUMBAR WITHOUT AND WITH CONTRAST CONTRAST:  4mL GADAVIST GADOBUTROL 1 MMOL/ML IV SOLN TECHNIQUE: Multiplanar, multiecho pulse sequences of the brain and surrounding structures, and cervical, thoracic and lumbar spine were obtained without and with intravenous contrast. COMPARISON:  None Available. FINDINGS: MRI HEAD FINDINGS Motion limited.  Within this limitation: Brain: No acute infarction, hemorrhage, hydrocephalus, extra-axial collection or mass lesion. Similar scattered T2/FLAIR hyperintensity in the white matter, compatible with chronic demyelination. No pathologic enhancement. Vascular: Major arterial flow voids are maintained. Skull and upper cervical spine: Normal marrow  signal. Sinuses/Orbits: Clear sinuses.  No acute orbital findings. Other: No mastoid effusions. MRI CERVICAL SPINE FINDINGS Alignment: No substantial sagittal subluxation. Vertebrae: No fracture, evidence of discitis, or bone lesion. Cord: Similar appearance of multifocal T2 hyperintense cord lesions spanning C2-C5 on motion limited assessment. 6 no clearly new cord lesion. No abnormal cord enhancement. Posterior Fossa, vertebral arteries, paraspinal tissues: Negative. Disc levels: Facet and uncovertebral hypertrophy without significant canal or foraminal stenosis. MRI THORACIC SPINE FINDINGS Alignment: Exaggerated thoracic kyphosis. No substantial sagittal subluxation. Vertebrae: No fracture, evidence of discitis, or suspicious bone lesion. Cord: No substantial change in multifocal T2 hyperintensity in the cord at T5, T6-T7 and T8-T9. no pathologic enhancement. Paraspinal and other soft tissues: Unremarkable. Disc levels: No significant canal or foraminal stenosis. MRI LUMBAR SPINE FINDINGS Alignment: No substantial sagittal subluxation. Vertebrae: No fracture, evidence of discitis, or bone lesion. Cord:  Normal. Paraspinal and other soft tissues:  Unremarkable. Disc levels: T12-L1: No significant disc protrusion, foraminal stenosis, or canal stenosis. L1-L2: No significant disc protrusion, foraminal stenosis, or canal stenosis. L2-L3: Mild disc bulging.  No significant stenosis. L3-L4: Mild disc bulging.  No significant stenosis. L4-L5: Mild disc bulging. Ligamentum flavum thickening facet arthropathy. No significant stenosis. L5-S1: Mild disc bulging and endplate spurring. Facet arthropathy. No significant stenosis. IMPRESSION: 1. Unchanged appearance/distribution of chronic demyelinating lesions intracranially and in the spinal cord. No enhancing lesions to suggest active demyelination. 2. No significant canal or foraminal stenosis. Electronically Signed   By: Feliberto Harts M.D.   On: 02/14/2023 00:09   MR Cervical Spine W and Wo Contrast Result Date: 02/14/2023 EXAM: MRI HEAD WITHOUT AND WITH CONTRAST MRI CERVICAL SPINE WITHOUT AND WITH CONTRAST MRI CERVICAL THORACIC WITHOUT AND WITH CONTRAST MRI LUMBAR WITHOUT AND WITH CONTRAST CONTRAST:  4mL GADAVIST GADOBUTROL 1 MMOL/ML IV SOLN TECHNIQUE: Multiplanar, multiecho pulse sequences of the brain and surrounding structures, and cervical, thoracic and lumbar spine were obtained without and with intravenous contrast. COMPARISON:  None Available. FINDINGS: MRI HEAD FINDINGS Motion limited.  Within this limitation: Brain: No acute infarction, hemorrhage, hydrocephalus, extra-axial collection or mass lesion. Similar scattered T2/FLAIR hyperintensity in the white matter, compatible with chronic demyelination. No pathologic enhancement. Vascular: Major arterial flow voids are maintained. Skull and upper cervical spine: Normal marrow signal. Sinuses/Orbits: Clear sinuses.  No acute orbital findings. Other: No mastoid effusions. MRI CERVICAL SPINE FINDINGS Alignment: No substantial sagittal subluxation. Vertebrae: No fracture, evidence of discitis, or bone lesion. Cord: Similar appearance of multifocal T2  hyperintense cord lesions spanning C2-C5 on motion limited assessment. 6 no clearly new cord lesion. No abnormal cord enhancement. Posterior Fossa, vertebral arteries, paraspinal tissues: Negative. Disc levels: Facet and uncovertebral hypertrophy without significant canal or foraminal stenosis. MRI THORACIC SPINE FINDINGS Alignment: Exaggerated thoracic kyphosis. No substantial sagittal subluxation. Vertebrae: No fracture, evidence of discitis, or suspicious bone lesion. Cord: No substantial change in multifocal T2 hyperintensity in the cord at T5, T6-T7 and T8-T9. no pathologic enhancement. Paraspinal and other soft tissues: Unremarkable. Disc levels: No significant canal or foraminal stenosis. MRI LUMBAR SPINE FINDINGS Alignment: No substantial sagittal subluxation. Vertebrae: No fracture, evidence of discitis, or bone lesion. Cord:  Normal. Paraspinal and other soft tissues: Unremarkable. Disc levels: T12-L1: No significant disc protrusion, foraminal stenosis, or canal stenosis. L1-L2: No significant disc protrusion, foraminal stenosis, or canal stenosis. L2-L3: Mild disc bulging.  No significant stenosis. L3-L4: Mild disc bulging.  No significant stenosis. L4-L5: Mild disc bulging. Ligamentum flavum thickening facet arthropathy. No significant stenosis. L5-S1: Mild disc bulging  and endplate spurring. Facet arthropathy. No significant stenosis. IMPRESSION: 1. Unchanged appearance/distribution of chronic demyelinating lesions intracranially and in the spinal cord. No enhancing lesions to suggest active demyelination. 2. No significant canal or foraminal stenosis. Electronically Signed   By: Feliberto Harts M.D.   On: 02/14/2023 00:09   MR THORACIC SPINE W WO CONTRAST Result Date: 02/14/2023 EXAM: MRI HEAD WITHOUT AND WITH CONTRAST MRI CERVICAL SPINE WITHOUT AND WITH CONTRAST MRI CERVICAL THORACIC WITHOUT AND WITH CONTRAST MRI LUMBAR WITHOUT AND WITH CONTRAST CONTRAST:  4mL GADAVIST GADOBUTROL 1 MMOL/ML IV  SOLN TECHNIQUE: Multiplanar, multiecho pulse sequences of the brain and surrounding structures, and cervical, thoracic and lumbar spine were obtained without and with intravenous contrast. COMPARISON:  None Available. FINDINGS: MRI HEAD FINDINGS Motion limited.  Within this limitation: Brain: No acute infarction, hemorrhage, hydrocephalus, extra-axial collection or mass lesion. Similar scattered T2/FLAIR hyperintensity in the white matter, compatible with chronic demyelination. No pathologic enhancement. Vascular: Major arterial flow voids are maintained. Skull and upper cervical spine: Normal marrow signal. Sinuses/Orbits: Clear sinuses.  No acute orbital findings. Other: No mastoid effusions. MRI CERVICAL SPINE FINDINGS Alignment: No substantial sagittal subluxation. Vertebrae: No fracture, evidence of discitis, or bone lesion. Cord: Similar appearance of multifocal T2 hyperintense cord lesions spanning C2-C5 on motion limited assessment. 6 no clearly new cord lesion. No abnormal cord enhancement. Posterior Fossa, vertebral arteries, paraspinal tissues: Negative. Disc levels: Facet and uncovertebral hypertrophy without significant canal or foraminal stenosis. MRI THORACIC SPINE FINDINGS Alignment: Exaggerated thoracic kyphosis. No substantial sagittal subluxation. Vertebrae: No fracture, evidence of discitis, or suspicious bone lesion. Cord: No substantial change in multifocal T2 hyperintensity in the cord at T5, T6-T7 and T8-T9. no pathologic enhancement. Paraspinal and other soft tissues: Unremarkable. Disc levels: No significant canal or foraminal stenosis. MRI LUMBAR SPINE FINDINGS Alignment: No substantial sagittal subluxation. Vertebrae: No fracture, evidence of discitis, or bone lesion. Cord:  Normal. Paraspinal and other soft tissues: Unremarkable. Disc levels: T12-L1: No significant disc protrusion, foraminal stenosis, or canal stenosis. L1-L2: No significant disc protrusion, foraminal stenosis, or canal  stenosis. L2-L3: Mild disc bulging.  No significant stenosis. L3-L4: Mild disc bulging.  No significant stenosis. L4-L5: Mild disc bulging. Ligamentum flavum thickening facet arthropathy. No significant stenosis. L5-S1: Mild disc bulging and endplate spurring. Facet arthropathy. No significant stenosis. IMPRESSION: 1. Unchanged appearance/distribution of chronic demyelinating lesions intracranially and in the spinal cord. No enhancing lesions to suggest active demyelination. 2. No significant canal or foraminal stenosis. Electronically Signed   By: Feliberto Harts M.D.   On: 02/14/2023 00:09   MR Lumbar Spine W Wo Contrast Result Date: 02/14/2023 EXAM: MRI HEAD WITHOUT AND WITH CONTRAST MRI CERVICAL SPINE WITHOUT AND WITH CONTRAST MRI CERVICAL THORACIC WITHOUT AND WITH CONTRAST MRI LUMBAR WITHOUT AND WITH CONTRAST CONTRAST:  4mL GADAVIST GADOBUTROL 1 MMOL/ML IV SOLN TECHNIQUE: Multiplanar, multiecho pulse sequences of the brain and surrounding structures, and cervical, thoracic and lumbar spine were obtained without and with intravenous contrast. COMPARISON:  None Available. FINDINGS: MRI HEAD FINDINGS Motion limited.  Within this limitation: Brain: No acute infarction, hemorrhage, hydrocephalus, extra-axial collection or mass lesion. Similar scattered T2/FLAIR hyperintensity in the white matter, compatible with chronic demyelination. No pathologic enhancement. Vascular: Major arterial flow voids are maintained. Skull and upper cervical spine: Normal marrow signal. Sinuses/Orbits: Clear sinuses.  No acute orbital findings. Other: No mastoid effusions. MRI CERVICAL SPINE FINDINGS Alignment: No substantial sagittal subluxation. Vertebrae: No fracture, evidence of discitis, or bone lesion. Cord: Similar appearance of multifocal T2  hyperintense cord lesions spanning C2-C5 on motion limited assessment. 6 no clearly new cord lesion. No abnormal cord enhancement. Posterior Fossa, vertebral arteries, paraspinal  tissues: Negative. Disc levels: Facet and uncovertebral hypertrophy without significant canal or foraminal stenosis. MRI THORACIC SPINE FINDINGS Alignment: Exaggerated thoracic kyphosis. No substantial sagittal subluxation. Vertebrae: No fracture, evidence of discitis, or suspicious bone lesion. Cord: No substantial change in multifocal T2 hyperintensity in the cord at T5, T6-T7 and T8-T9. no pathologic enhancement. Paraspinal and other soft tissues: Unremarkable. Disc levels: No significant canal or foraminal stenosis. MRI LUMBAR SPINE FINDINGS Alignment: No substantial sagittal subluxation. Vertebrae: No fracture, evidence of discitis, or bone lesion. Cord:  Normal. Paraspinal and other soft tissues: Unremarkable. Disc levels: T12-L1: No significant disc protrusion, foraminal stenosis, or canal stenosis. L1-L2: No significant disc protrusion, foraminal stenosis, or canal stenosis. L2-L3: Mild disc bulging.  No significant stenosis. L3-L4: Mild disc bulging.  No significant stenosis. L4-L5: Mild disc bulging. Ligamentum flavum thickening facet arthropathy. No significant stenosis. L5-S1: Mild disc bulging and endplate spurring. Facet arthropathy. No significant stenosis. IMPRESSION: 1. Unchanged appearance/distribution of chronic demyelinating lesions intracranially and in the spinal cord. No enhancing lesions to suggest active demyelination. 2. No significant canal or foraminal stenosis. Electronically Signed   By: Feliberto Harts M.D.   On: 02/14/2023 00:09     ED Course:  Treated with single dose of fosfomycin 3 g, otherwise treated with Ativan, morphine, oxycodone.   Assessment/Plan:  58 y.o. female with hx relapsing remitting multiple sclerosis, hemochromatosis, abnormal LFT, hypertension, mood disorder, who presented due to worsening weakness and sensory changes in her lower extremities, worsening low back and LE pain. Found to have UTI with presumed pseudorelapse of multiple sclerosis.    Presumed pseudorelapse of multiple sclerosis  Hx relapsing remitting multiple sclerosis P/w 3-4 days worsening weakness / sensory changes in bilateral LE and hands. Exam with patchy weakenss, esp in the LLE and sensory loss in stocking distribution below ankles, hyperreflexia. MRI of the Brain, C / T/ L spine with chronic demyelinating lesions, no enhancing lesion to suggest active demyelination.  -EDP [place] discussed with neurology Dr. Amada Jupiter and per his report recommending for infectious evaluation considering WBC count and no active demyelination on MRIs.  No additional treatment for MS recommended - Treatment of UTI per below - PT/OT evaluation - Symptomatic management: Tylenol as needed mild, methocarbamol prn for spasm, oxycodone 2.5/5 mg for moderate/severe.  Consider adding gabapentin for neuropathic type pain.  - Outpatient follow-up with neurology  Urinary tract infection, uncomplicated Despite absence of significant urinary symptoms, UA with rare bacteria, greater than 50 WBC, WBC clumps, small leukocytes, negative nitrites.  Given suspected MS pseudo relapse treated as true UTI  - s/p single dose of Fosfomycin 3 g x1, f/u urine culture   Chronic medical problems: Hemochromatosis: Noted history,  Hx Abnormal LFT: Per chart review previously not felt to be related to hemochromatosis.  Check LFT Hypertension: Continue home amlodipine Mood disorder: Not on psychotropic medication.  Body mass index is 17.38 kg/m.    DVT prophylaxis:  Lovenox Code Status:  DNR/DNI(Do NOT Intubate); confirmed with the patient  Diet:  Diet Orders (From admission, onward)     Start     Ordered   02/14/23 0417  Diet regular Room service appropriate? Yes; Fluid consistency: Thin  Diet effective now       Question Answer Comment  Room service appropriate? Yes   Fluid consistency: Thin      02/14/23 0422  Family Communication:  No   Consults:  None   Admission status:    Observation, Med-Surg  Severity of Illness: The appropriate patient status for this patient is OBSERVATION. Observation status is judged to be reasonable and necessary in order to provide the required intensity of service to ensure the patient's safety. The patient's presenting symptoms, physical exam findings, and initial radiographic and laboratory data in the context of their medical condition is felt to place them at decreased risk for further clinical deterioration. Furthermore, it is anticipated that the patient will be medically stable for discharge from the hospital within 2 midnights of admission.    Dolly Rias, MD [facility]  How to contact the [facility] Attending or Consulting provider 7A - 7P or covering provider during after hours 7P -7A, for this patient.  Check the care team in [facility] and look for a) attending/consulting [facility] provider listed and b) the [facility] team listed Log into [email or web address] and use [place]'s universal password to access. If you do not have the password, please contact the hospital operator. Locate the [facility] provider you are looking for under [facility] and page to a number that you can be directly reached. If you still have difficulty reaching the provider, please page the [facility] (Director on Call) for the Hospitalists listed on amion for assistance.  02/14/2023, 8:00 AM

## 2023-02-14 NOTE — ED Notes (Signed)
ED Provider at bedside.

## 2023-02-14 NOTE — Progress Notes (Signed)
PROGRESS NOTE    Gabriela Campbell  WUJ:[id number] DOB: 04-22-1965 DOA: 02/13/2023 PCP: Pcp, No  Subjective: Pt seen and examined. MRI brain, C,T,L-spine negative for any new lesions. UTI Treated with dose of po fosfomycin. Awaiting PT assessment. Possible home today.   Hospital Course: HPI: Gabriela Campbell is a 58 y.o. female with hx of relapsing remitting multiple sclerosis, hemochromatosis, abnormal LFT, hypertension, mood disorder, who presented due to worsening weakness and sensory changes in her lower extremities.  Reports that these changes have been ongoing for the past 3 to 4 days.  Describes weakness in her hip flexors and around the knee, paresthesias along the anterior thighs, worsening numbness of both feet.  Also has paresthesias in both hands.  Otherwise no history of headache, speech change, vision changes, head injury.  Denies any dysuria or recent urinary changes.  Did have an episode of vomiting about 2 weeks ago but no other GI symptoms at this time.  No fevers.   Significant Events: Admitted 02/13/2023 for UTI   Significant Labs: WBC 13.9, Hgb 15.7, plt 194 Na 139, K 3.8, CO2 of 25, BUN 16, scr 0.67 UA cloudy, small LE, negative nitrite, WBC clumps  Significant Imaging Studies: MRI brain, c-spine, t-spine, L-spine shows  Unchanged appearance/distribution of chronic demyelinating lesions intracranially and in the spinal cord. No enhancing lesions to suggest active demyelination. . No significant canal or foraminal stenosis. CXR No active cardiopulmonary disease  Antibiotic Therapy: Anti-infectives (From admission, onward)    Start     Dose/Rate Route Frequency Ordered Stop   02/14/23 0230  fosfomycin (MONUROL) packet 3 g        3 g Oral  Once 02/14/23 0220 02/14/23 0232       Procedures:   Consultants:     Assessment and Plan: * Urinary tract infection Treated with po fosfomycin. Likely the cause of her weakness. NOT a MS exacerbation.  Elevated  LFTs Hx of elevated LFTs. But usually AST/ALT are not this high. Check RUQ U/S. Last liver biopsy in 06-2020 showed "Her last liver biopsy showed concern for chronic hepatitis with grade 2 or 3 fibrosis.". she does have hx of hemochromatosis.  Pt was referred to [facility] but never went to appointment.  Appears pt referred to [facility] transplant center but was rejected due to ineligibility.    Essential hypertension Stable. Hold HTN meds for SBP <120.  Relapsing remitting multiple sclerosis (HCC) Chronic.       DVT prophylaxis: enoxaparin (LOVENOX) injection 30 mg Start: 02/14/23 1000    Code Status: Limited: Do not attempt resuscitation (DNR) -DNR-LIMITED -Do Not Intubate/DNI  Family Communication: no family at bedside. Pt lives with her father who assists in her care Disposition Plan: return home Reason for continuing need for hospitalization: awaiting PT consult. RUQ U/S ordered to evaluate her chronically elevated LFTs.  Objective: Vitals:   02/14/23 0522 02/14/23 0731 02/14/23 1005 02/14/23 1005  BP:  109/72 101/82 101/82  Pulse:  84  96  Resp:  18  18  Temp: (!) 97.5 F (36.4 C) (!) 97.5 F (36.4 C)  98.1 F (36.7 C)  TempSrc: Oral Oral    SpO2:  96%  99%  Weight:      Height:       No intake or output data in the 24 hours ending 02/14/23 1059 Filed Weights   02/13/23 1734  Weight: 43.1 kg    Examination:  Physical Exam Vitals and nursing note reviewed.  Constitutional:  General: She is not in acute distress.    Appearance: She is not toxic-appearing or diaphoretic.     Comments: Appears chronically ill  HENT:     Head: Normocephalic and atraumatic.     Nose: Nose normal.  Cardiovascular:     Rate and Rhythm: Normal rate and regular rhythm.  Pulmonary:     Effort: Pulmonary effort is normal.     Breath sounds: Normal breath sounds.  Abdominal:     General: Abdomen is flat. Bowel sounds are normal.     Palpations: Abdomen is soft.  Musculoskeletal:      Right lower leg: No edema.     Left lower leg: No edema.  Skin:    General: Skin is warm and dry.     Capillary Refill: Capillary refill takes less than 2 seconds.  Neurological:     Mental Status: She is alert and oriented to person, place, and time.     Data Reviewed: I have personally reviewed following labs and imaging studies  CBC: Recent Labs  Lab 02/13/23 1823 02/14/23 0500  WBC 13.9* 16.7*  NEUTROABS 10.8*  --   HGB 15.7* 14.3  HCT 46.4* 43.7  MCV 95.3 96.5  PLT 194 193   Basic Metabolic Panel: Recent Labs  Lab 02/13/23 1823 02/14/23 0500  NA 139 136  K 3.8 3.9  CL 102 101  CO2 25 26  GLUCOSE 103* 106*  BUN 16 17  CREATININE 0.67 0.67  CALCIUM 9.5 9.2  MG  --  2.1  PHOS  --  4.3   GFR: Estimated Creatinine Clearance: 51.5 mL/min (by C-G formula based on SCr of 0.67 mg/dL). Liver Function Tests: Recent Labs  Lab 02/14/23 0500  AST 849*  ALT 373*  ALKPHOS 524*  BILITOT 1.0  PROT 7.0  ALBUMIN 3.4*   Cardiac Enzymes: Recent Labs  Lab 02/13/23 2156  CKTOTAL 61   Anemia Panel: Recent Labs    02/13/23 2156  VITAMINB12 3,118*   Radiology Studies: DG Chest 2 View Result Date: 02/14/2023 CLINICAL DATA:  Elevated white blood cell count. EXAM: CHEST - 2 VIEW COMPARISON:  05/07/2022 FINDINGS: Biapical scarring, stable. Heart is normal size. No acute confluent opacities or effusions. No acute bony abnormality. IMPRESSION: No active cardiopulmonary disease. Electronically Signed   By: Charlett Nose M.D.   On: 02/14/2023 01:23   MR Brain W and Wo Contrast Result Date: 02/14/2023 EXAM: MRI HEAD WITHOUT AND WITH CONTRAST MRI CERVICAL SPINE WITHOUT AND WITH CONTRAST MRI CERVICAL THORACIC WITHOUT AND WITH CONTRAST MRI LUMBAR WITHOUT AND WITH CONTRAST CONTRAST:  4mL GADAVIST GADOBUTROL 1 MMOL/ML IV SOLN TECHNIQUE: Multiplanar, multiecho pulse sequences of the brain and surrounding structures, and cervical, thoracic and lumbar spine were obtained without and  with intravenous contrast. COMPARISON:  None Available. FINDINGS: MRI HEAD FINDINGS Motion limited.  Within this limitation: Brain: No acute infarction, hemorrhage, hydrocephalus, extra-axial collection or mass lesion. Similar scattered T2/FLAIR hyperintensity in the white matter, compatible with chronic demyelination. No pathologic enhancement. Vascular: Major arterial flow voids are maintained. Skull and upper cervical spine: Normal marrow signal. Sinuses/Orbits: Clear sinuses.  No acute orbital findings. Other: No mastoid effusions. MRI CERVICAL SPINE FINDINGS Alignment: No substantial sagittal subluxation. Vertebrae: No fracture, evidence of discitis, or bone lesion. Cord: Similar appearance of multifocal T2 hyperintense cord lesions spanning C2-C5 on motion limited assessment. 6 no clearly new cord lesion. No abnormal cord enhancement. Posterior Fossa, vertebral arteries, paraspinal tissues: Negative. Disc levels: Facet and uncovertebral hypertrophy without  significant canal or foraminal stenosis. MRI THORACIC SPINE FINDINGS Alignment: Exaggerated thoracic kyphosis. No substantial sagittal subluxation. Vertebrae: No fracture, evidence of discitis, or suspicious bone lesion. Cord: No substantial change in multifocal T2 hyperintensity in the cord at T5, T6-T7 and T8-T9. no pathologic enhancement. Paraspinal and other soft tissues: Unremarkable. Disc levels: No significant canal or foraminal stenosis. MRI LUMBAR SPINE FINDINGS Alignment: No substantial sagittal subluxation. Vertebrae: No fracture, evidence of discitis, or bone lesion. Cord:  Normal. Paraspinal and other soft tissues: Unremarkable. Disc levels: T12-L1: No significant disc protrusion, foraminal stenosis, or canal stenosis. L1-L2: No significant disc protrusion, foraminal stenosis, or canal stenosis. L2-L3: Mild disc bulging.  No significant stenosis. L3-L4: Mild disc bulging.  No significant stenosis. L4-L5: Mild disc bulging. Ligamentum flavum  thickening facet arthropathy. No significant stenosis. L5-S1: Mild disc bulging and endplate spurring. Facet arthropathy. No significant stenosis. IMPRESSION: 1. Unchanged appearance/distribution of chronic demyelinating lesions intracranially and in the spinal cord. No enhancing lesions to suggest active demyelination. 2. No significant canal or foraminal stenosis. Electronically Signed   By: Feliberto Harts M.D.   On: 02/14/2023 00:09   MR Cervical Spine W and Wo Contrast Result Date: 02/14/2023 EXAM: MRI HEAD WITHOUT AND WITH CONTRAST MRI CERVICAL SPINE WITHOUT AND WITH CONTRAST MRI CERVICAL THORACIC WITHOUT AND WITH CONTRAST MRI LUMBAR WITHOUT AND WITH CONTRAST CONTRAST:  4mL GADAVIST GADOBUTROL 1 MMOL/ML IV SOLN TECHNIQUE: Multiplanar, multiecho pulse sequences of the brain and surrounding structures, and cervical, thoracic and lumbar spine were obtained without and with intravenous contrast. COMPARISON:  None Available. FINDINGS: MRI HEAD FINDINGS Motion limited.  Within this limitation: Brain: No acute infarction, hemorrhage, hydrocephalus, extra-axial collection or mass lesion. Similar scattered T2/FLAIR hyperintensity in the white matter, compatible with chronic demyelination. No pathologic enhancement. Vascular: Major arterial flow voids are maintained. Skull and upper cervical spine: Normal marrow signal. Sinuses/Orbits: Clear sinuses.  No acute orbital findings. Other: No mastoid effusions. MRI CERVICAL SPINE FINDINGS Alignment: No substantial sagittal subluxation. Vertebrae: No fracture, evidence of discitis, or bone lesion. Cord: Similar appearance of multifocal T2 hyperintense cord lesions spanning C2-C5 on motion limited assessment. 6 no clearly new cord lesion. No abnormal cord enhancement. Posterior Fossa, vertebral arteries, paraspinal tissues: Negative. Disc levels: Facet and uncovertebral hypertrophy without significant canal or foraminal stenosis. MRI THORACIC SPINE FINDINGS Alignment:  Exaggerated thoracic kyphosis. No substantial sagittal subluxation. Vertebrae: No fracture, evidence of discitis, or suspicious bone lesion. Cord: No substantial change in multifocal T2 hyperintensity in the cord at T5, T6-T7 and T8-T9. no pathologic enhancement. Paraspinal and other soft tissues: Unremarkable. Disc levels: No significant canal or foraminal stenosis. MRI LUMBAR SPINE FINDINGS Alignment: No substantial sagittal subluxation. Vertebrae: No fracture, evidence of discitis, or bone lesion. Cord:  Normal. Paraspinal and other soft tissues: Unremarkable. Disc levels: T12-L1: No significant disc protrusion, foraminal stenosis, or canal stenosis. L1-L2: No significant disc protrusion, foraminal stenosis, or canal stenosis. L2-L3: Mild disc bulging.  No significant stenosis. L3-L4: Mild disc bulging.  No significant stenosis. L4-L5: Mild disc bulging. Ligamentum flavum thickening facet arthropathy. No significant stenosis. L5-S1: Mild disc bulging and endplate spurring. Facet arthropathy. No significant stenosis. IMPRESSION: 1. Unchanged appearance/distribution of chronic demyelinating lesions intracranially and in the spinal cord. No enhancing lesions to suggest active demyelination. 2. No significant canal or foraminal stenosis. Electronically Signed   By: Feliberto Harts M.D.   On: 02/14/2023 00:09   MR THORACIC SPINE W WO CONTRAST Result Date: 02/14/2023 EXAM: MRI HEAD WITHOUT AND WITH CONTRAST MRI CERVICAL  SPINE WITHOUT AND WITH CONTRAST MRI CERVICAL THORACIC WITHOUT AND WITH CONTRAST MRI LUMBAR WITHOUT AND WITH CONTRAST CONTRAST:  4mL GADAVIST GADOBUTROL 1 MMOL/ML IV SOLN TECHNIQUE: Multiplanar, multiecho pulse sequences of the brain and surrounding structures, and cervical, thoracic and lumbar spine were obtained without and with intravenous contrast. COMPARISON:  None Available. FINDINGS: MRI HEAD FINDINGS Motion limited.  Within this limitation: Brain: No acute infarction, hemorrhage,  hydrocephalus, extra-axial collection or mass lesion. Similar scattered T2/FLAIR hyperintensity in the white matter, compatible with chronic demyelination. No pathologic enhancement. Vascular: Major arterial flow voids are maintained. Skull and upper cervical spine: Normal marrow signal. Sinuses/Orbits: Clear sinuses.  No acute orbital findings. Other: No mastoid effusions. MRI CERVICAL SPINE FINDINGS Alignment: No substantial sagittal subluxation. Vertebrae: No fracture, evidence of discitis, or bone lesion. Cord: Similar appearance of multifocal T2 hyperintense cord lesions spanning C2-C5 on motion limited assessment. 6 no clearly new cord lesion. No abnormal cord enhancement. Posterior Fossa, vertebral arteries, paraspinal tissues: Negative. Disc levels: Facet and uncovertebral hypertrophy without significant canal or foraminal stenosis. MRI THORACIC SPINE FINDINGS Alignment: Exaggerated thoracic kyphosis. No substantial sagittal subluxation. Vertebrae: No fracture, evidence of discitis, or suspicious bone lesion. Cord: No substantial change in multifocal T2 hyperintensity in the cord at T5, T6-T7 and T8-T9. no pathologic enhancement. Paraspinal and other soft tissues: Unremarkable. Disc levels: No significant canal or foraminal stenosis. MRI LUMBAR SPINE FINDINGS Alignment: No substantial sagittal subluxation. Vertebrae: No fracture, evidence of discitis, or bone lesion. Cord:  Normal. Paraspinal and other soft tissues: Unremarkable. Disc levels: T12-L1: No significant disc protrusion, foraminal stenosis, or canal stenosis. L1-L2: No significant disc protrusion, foraminal stenosis, or canal stenosis. L2-L3: Mild disc bulging.  No significant stenosis. L3-L4: Mild disc bulging.  No significant stenosis. L4-L5: Mild disc bulging. Ligamentum flavum thickening facet arthropathy. No significant stenosis. L5-S1: Mild disc bulging and endplate spurring. Facet arthropathy. No significant stenosis. IMPRESSION: 1.  Unchanged appearance/distribution of chronic demyelinating lesions intracranially and in the spinal cord. No enhancing lesions to suggest active demyelination. 2. No significant canal or foraminal stenosis. Electronically Signed   By: Feliberto Harts M.D.   On: 02/14/2023 00:09   MR Lumbar Spine W Wo Contrast Result Date: 02/14/2023 EXAM: MRI HEAD WITHOUT AND WITH CONTRAST MRI CERVICAL SPINE WITHOUT AND WITH CONTRAST MRI CERVICAL THORACIC WITHOUT AND WITH CONTRAST MRI LUMBAR WITHOUT AND WITH CONTRAST CONTRAST:  4mL GADAVIST GADOBUTROL 1 MMOL/ML IV SOLN TECHNIQUE: Multiplanar, multiecho pulse sequences of the brain and surrounding structures, and cervical, thoracic and lumbar spine were obtained without and with intravenous contrast. COMPARISON:  None Available. FINDINGS: MRI HEAD FINDINGS Motion limited.  Within this limitation: Brain: No acute infarction, hemorrhage, hydrocephalus, extra-axial collection or mass lesion. Similar scattered T2/FLAIR hyperintensity in the white matter, compatible with chronic demyelination. No pathologic enhancement. Vascular: Major arterial flow voids are maintained. Skull and upper cervical spine: Normal marrow signal. Sinuses/Orbits: Clear sinuses.  No acute orbital findings. Other: No mastoid effusions. MRI CERVICAL SPINE FINDINGS Alignment: No substantial sagittal subluxation. Vertebrae: No fracture, evidence of discitis, or bone lesion. Cord: Similar appearance of multifocal T2 hyperintense cord lesions spanning C2-C5 on motion limited assessment. 6 no clearly new cord lesion. No abnormal cord enhancement. Posterior Fossa, vertebral arteries, paraspinal tissues: Negative. Disc levels: Facet and uncovertebral hypertrophy without significant canal or foraminal stenosis. MRI THORACIC SPINE FINDINGS Alignment: Exaggerated thoracic kyphosis. No substantial sagittal subluxation. Vertebrae: No fracture, evidence of discitis, or suspicious bone lesion. Cord: No substantial change in  multifocal T2 hyperintensity in  the cord at T5, T6-T7 and T8-T9. no pathologic enhancement. Paraspinal and other soft tissues: Unremarkable. Disc levels: No significant canal or foraminal stenosis. MRI LUMBAR SPINE FINDINGS Alignment: No substantial sagittal subluxation. Vertebrae: No fracture, evidence of discitis, or bone lesion. Cord:  Normal. Paraspinal and other soft tissues: Unremarkable. Disc levels: T12-L1: No significant disc protrusion, foraminal stenosis, or canal stenosis. L1-L2: No significant disc protrusion, foraminal stenosis, or canal stenosis. L2-L3: Mild disc bulging.  No significant stenosis. L3-L4: Mild disc bulging.  No significant stenosis. L4-L5: Mild disc bulging. Ligamentum flavum thickening facet arthropathy. No significant stenosis. L5-S1: Mild disc bulging and endplate spurring. Facet arthropathy. No significant stenosis. IMPRESSION: 1. Unchanged appearance/distribution of chronic demyelinating lesions intracranially and in the spinal cord. No enhancing lesions to suggest active demyelination. 2. No significant canal or foraminal stenosis. Electronically Signed   By: Feliberto Harts M.D.   On: 02/14/2023 00:09    Scheduled Meds:  amLODipine  5 mg Oral Daily   enoxaparin (LOVENOX) injection  30 mg Subcutaneous Q24H   lidocaine  1 patch Transdermal Q24H   thiamine  100 mg Oral Daily   Continuous Infusions:   LOS: 0 days   Time spent: 40 minutes  Carollee Herter, DO  [facility]  02/14/2023, 10:59 AM

## 2023-02-14 NOTE — Subjective & Objective (Signed)
Pt seen and examined. MRI brain, C,T,L-spine negative for any new lesions. UTI Treated with dose of po fosfomycin. Awaiting PT assessment. Possible home today.

## 2023-02-14 NOTE — Care Management (Addendum)
Transition of Care [facility]) - Emergency Department Mini Assessment   Patient Details  Name: Gabriela Campbell MRN: [id number] Date of Birth: April 01, 1965  [facility] CM/SW Contact:    Lavenia Atlas, RN Phone Number: 02/14/2023, 1:20 PM   Clinical Narrative: Received secure chat from PT who reports patient has been recommended for HHPT and needs DME: narrow adult wheelchair.  This RNCM spoke with patient at bedside to offer [facility] choice ([email or web address]). Patient chose [facility] as she used them previously. Patient reports she has a walker prior to admission however was given to her and not filed under her insurance.  This RNCM spoke with Cindie with [employer] who will follow patient for HHPT in the community. Notified Jermaine with [employer] for DME: narrow adult wheelchair. Will notify  MD for [facility] orders and DME: narrow adult wheelchair.  This RNCM explained OBS status and patient verbalized understanding.   TOC will continue to follow.  ED Mini Assessment: What brought you to the Emergency Department? : M flare up and weakness  Barriers to Discharge: Continued Medical Work up  [employer] interventions: Coordinating HH services and DME  Means of departure: Car  Interventions which prevented an admission or readmission: Home Health Consult or Services, DME Provided    Patient Contact and Communications        ,          Patient states their goals for this hospitalization and ongoing recovery are:: To feel better CMS Medicare.gov Compare Post Acute Care list provided to:: Patient Choice offered to / list presented to : Patient  Admission diagnosis:  Multiple sclerosis exacerbation (HCC) [G35] Patient Active Problem List   Diagnosis Date Noted   Urinary tract infection 02/14/2023   Elevated LFTs 07/30/2022   Bilateral leg weakness 07/28/2022   Severe low back pain 07/28/2022   Essential hypertension 07/28/2022   Polycythemia 07/28/2022   left leg pain 06/29/2021    Hereditary hemochromatosis (HCC) 07/29/2020   Elevated liver enzymes 05/31/2020   Vitamin D deficiency 02/18/2018   Relapsing remitting multiple sclerosis (HCC) 03/28/2017   Gait abnormality 03/14/2017   Smoker 10/10/2016   Leg weakness, bilateral 10/10/2016   PCP:  Pcp, No Pharmacy:   [id number] [place], [place] - [address] AT [facility] OF [facility] & [address] [place] [address] Phone: [phone] Fax: [phone]

## 2023-02-14 NOTE — Consult Note (Signed)
Consultation  Referring Provider:  D. Imogene Burn Primary Care Physician:  Pcp, No Primary Gastroenterologist:  Dr. Adela Lank       Reason for Consultation:   Elevated LFTs DOA: 02/13/2023         Hospital Day: 2         HPI:   Gabriela Campbell is a 58 y.o. female with past medical history significant for multiple sclerosis, hemochromatosis, hypertension, mood disorder, abnormal liver function.   Presents to the ER with 2/12 with reports of worsening weakness and sensory changes lower extremities.   Work up notable for  MRI brain negative for new lesions, MRI lumbar spine thoracic and cervical with no significant canal foraminal stenosis WBC 13.9, absolute neutrophils 10.8 Hemoglobin 15.7, MCV 95, platelets 194 UTI with protein, leukocytes, rare bacteria suspicious for UTI CPK 61, B12 normal Normal kidney function  No LFTs were obtained day 1, hepatic function done 2/13 in the morning was abnormal, GI consulted for further evaluation. Albumin 3.4, AST 849, ALT 373, alk phos 524, total bilirubin 1.0, direct bili 0.5 Compared to 6 months ago AST 41, ALT 94, alk phos 462, total bili 0.4 RUQ showed  13 mm. Dilated extrahepatic bile duct which may be due to postcholecystectomy status. There is also mild central intrahepatic bile duct dilation. Distal common bile duct is not seen obscuration by overlying bowel gases. Correlate clinically and with liver function tests to determine the need for further imaging with MRI/MRCP.  Patient had significant workup in 2022 with Dr. Adela Lank for elevated LFTs. 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 however she was unable to obtain transportation, was referred to [facility] but her insurance at the time was not compatible and ultimately was referred to [facility] hepatology however patient never showed for the appointment. She has not been seen in the office since 2022, was seen once inpatient July 2024 for abnormal liver function thought potentially secondary to MS medication which was discontinued, yet again was referred to hepatology however this was never completed.  Patient states she has been doing fairly well since that time she continues to not drive has a physician that comes out to her house. Patient states prior to this most recent admission about 2 weeks ago she had 1 episode of nonbloody emesis but no symptoms prior after.   Thought it was something she ate. Patient denies any abdominal pain, nausea, jaundice, pruritus, rashes.   Denies any swelling of her abdomen or legs.   No shortness of breath or chest pain.  No confusion. Has baseline constipation with bowel movement every 2 to 4 days but denies melena, hematochezia or clay colored stools. No family history of liver disease. She is not on sny supplements other than B12 and vitamin D outpatient. Denies any antibiotic use in the last 6 months, given one dose of fosfomycin here Only new medication past year has been amlodipine for the past 68 months. She denies any Tylenol, NSAIDs for pain medication use. Patient denies any current alcohol use or previous heavy alcohol use, no tobacco use or drug use. Patient states since her MS diagnosis March 2019 she has  had a decrease in her weight stating she was 125 then most recently at 90 pounds with her appetite ebbing and flowing.  Denies any dysphagia.  Abnormal ED labs: Abnormal Labs Reviewed  CBC WITH DIFFERENTIAL/PLATELET - Abnormal; Notable for the following components:      Result Value   WBC 13.9 (*)    Hemoglobin 15.7 (*)    HCT  46.4 (*)    Neutro Abs 10.8 (*)    All other components within normal limits  BASIC METABOLIC PANEL - Abnormal; Notable for the following components:   Glucose, Bld 103 (*)    All other components within normal limits  VITAMIN B12 - Abnormal; Notable for the following components:   Vitamin B-12 3,118 (*)    All other components within normal limits  URINALYSIS, W/ REFLEX TO CULTURE (INFECTION SUSPECTED) - Abnormal; Notable for the following components:   APPearance CLOUDY (*)    Hgb urine dipstick SMALL (*)    Protein, ur 30 (*)    Leukocytes,Ua SMALL (*)    Bacteria, UA RARE (*)    All other components within normal limits  BASIC METABOLIC PANEL - Abnormal; Notable for the following components:   Glucose, Bld 106 (*)    All other components within normal limits  CBC - Abnormal; Notable for the following components:   WBC 16.7 (*)    All other components within normal limits  HEPATIC FUNCTION PANEL - Abnormal; Notable for the following components:   Albumin 3.4 (*)    AST 849 (*)    ALT 373 (*)    Alkaline Phosphatase 524 (*)    Bilirubin, Direct 0.5 (*)    All other components within normal limits  FERRITIN - Abnormal; Notable for the following components:   Ferritin 1,481 (*)    All other components within normal limits  IRON AND TIBC - Abnormal; Notable for the following components:   Iron 187 (*)    Saturation Ratios 49 (*)    All other components within normal limits    Past Medical History:  Diagnosis Date   Allergy    Multiple sclerosis (HCC)    Shingles    Weakness     Surgical History:  She  has a past surgical history that includes Abdominal hysterectomy and Cholecystectomy. Family History:  Her family history includes Cancer in her paternal aunt, paternal aunt, and paternal grandmother. Social History:   reports that she has been smoking cigarettes. She has never used smokeless tobacco. She reports that she does not drink alcohol and does not use  drugs.  Prior to Admission medications   Medication Sig Start Date End Date Taking? Authorizing Provider  Cholecalciferol (VITAMIN D3) 25 MCG (1000 UT) CAPS Take 2,000 Units by mouth daily.   Yes [provider]  cyclobenzaprine (FLEXERIL) 10 MG tablet Take 1 tablet (10 mg total) by mouth 2 (two) times daily as needed for muscle spasms. 10/03/22  Yes Gwyneth Sprout, MD  thiamine (VITAMIN B-1) 100 MG tablet Take 100 mg by mouth daily.   Yes [provider]  amLODipine (NORVASC) 5 MG tablet Take 0.5 tablets (2.5 mg total) by mouth daily. 02/14/23   Carollee Herter, DO    Current Facility-Administered Medications  Medication Dose Route Frequency Provider Last Rate Last Admin   acetaminophen (TYLENOL) tablet 1,000 mg  1,000 mg Oral Q6H PRN Dolly Rias, MD       amLODipine (NORVASC) tablet 5 mg  5 mg Oral Daily Carollee Herter, DO  enoxaparin (LOVENOX) injection 30 mg  30 mg Subcutaneous Q24H Dolly Rias, MD   30 mg at 02/14/23 1007   lidocaine (LIDODERM) 5 % 1 patch  1 patch Transdermal Q24H Darrick Grinder, PA-C   1 patch at 02/14/23 8119   melatonin tablet 6 mg  6 mg Oral QHS PRN Dolly Rias, MD       methocarbamol (ROBAXIN) tablet 500 mg  500 mg Oral Q8H PRN Dolly Rias, MD       oxyCODONE (Oxy IR/ROXICODONE) immediate release tablet 2.5 mg  2.5 mg Oral Q4H PRN Dolly Rias, MD       Or   oxyCODONE (Oxy IR/ROXICODONE) immediate release tablet 5 mg  5 mg Oral Q4H PRN Dolly Rias, MD   5 mg at 02/14/23 1012   polyethylene glycol (MIRALAX / GLYCOLAX) packet 17 g  17 g Oral Daily PRN Dolly Rias, MD       thiamine (VITAMIN B1) tablet 100 mg  100 mg Oral Daily Dolly Rias, MD   100 mg at 02/14/23 1006    Allergies as of 02/13/2023 - Review Complete 02/13/2023  Allergen Reaction Noted   Dilaudid [hydromorphone hcl] Anaphylaxis 05/04/2015   Fish-derived products Anaphylaxis 07/28/2022   Ibuprofen Anaphylaxis 08/08/2013   Iodine Anaphylaxis  08/08/2013   Penicillins Anaphylaxis 05/04/2015   Shellfish-derived products Anaphylaxis 07/28/2022   Shrimp extract Anaphylaxis 07/28/2022   Baclofen Other (See Comments) 07/28/2022   Gabapentin Other (See Comments) 03/28/2017   Codeine Nausea Only 05/04/2015   Tramadol Nausea And Vomiting 08/08/2013    Review of Systems:    Constitutional: No weight loss, fever, chills, weakness or fatigue HEENT: Eyes: No change in vision               Ears, Nose, Throat:  No change in hearing or congestion Skin: No rash or itching Cardiovascular: No chest pain, chest pressure or palpitations   Respiratory: No SOB or cough Gastrointestinal: See HPI and otherwise negative Genitourinary: No dysuria or change in urinary frequency Neurological: No headache, dizziness or syncope Musculoskeletal: No new muscle or joint pain Hematologic: No bleeding or bruising Psychiatric: No history of depression or anxiety     Physical Exam:  Vital signs in last 24 hours: Temp:  [97.5 F (36.4 C)-98.4 F (36.9 C)] 97.9 F (36.6 C) (02/13 1336) Pulse Rate:  [79-102] 84 (02/13 1336) Resp:  [16-18] 16 (02/13 1336) BP: (101-135)/(69-101) 117/75 (02/13 1336) SpO2:  [96 %-100 %] 100 % (02/13 1336) Weight:  [43.1 kg] 43.1 kg (02/12 1734) Last BM Date : 02/13/23 Last BM recorded by nurses in past 5 days No data recorded  General:   Pleasant, well developed female in no acute distress Head:  Normocephalic and atraumatic. Eyes: sclerae anicteric,conjunctive pink  Heart:  regular rate and rhythm, no murmurs or gallops Pulm: Clear anteriorly; no wheezing Abdomen:  Soft, Obese AB, Active bowel sounds. mild RUQ tenderness No rebound, neg murphy, No organomegaly appreciated. Extremities:  Without edema. Msk:  Symmetrical without gross deformities. Peripheral pulses intact.  Neurologic:  Alert and  oriented x4;  No focal deficits.  Skin:   Dry and intact without significant lesions or rashes. Psychiatric:  Cooperative.  Normal mood and affect.  LAB RESULTS: Recent Labs    02/13/23 1823 02/14/23 0500  WBC 13.9* 16.7*  HGB 15.7* 14.3  HCT 46.4* 43.7  PLT 194 193   BMET Recent Labs    02/13/23 1823 02/14/23 0500  NA 139 136  K 3.8 3.9  CL 102 101  CO2 25 26  GLUCOSE 103* 106*  BUN 16 17  CREATININE 0.67 0.67  CALCIUM 9.5 9.2   LFT Recent Labs    02/14/23 0500  PROT 7.0  ALBUMIN 3.4*  AST 849*  ALT 373*  ALKPHOS 524*  BILITOT 1.0  BILIDIR 0.5*  IBILI 0.5   PT/INR Recent Labs    02/14/23 1303  LABPROT 13.3  INR 1.0    STUDIES: US Abdomen Limited RUQ (LIVER/GB) Result Date: 02/14/2023 CLINICAL DATA:  221910 Elevated LFTs 221910. EXAM: ULTRASOUND ABDOMEN LIMITED RIGHT UPPER QUADRANT COMPARISON:  None Available. FINDINGS: Gallbladder: Surgically absent. Common bile duct: Diameter: 13 mm. Dilated extrahepatic bile duct which may be due to postcholecystectomy status. There is also mild central intrahepatic bile duct dilation. Distal common bile duct is not seen obscuration by overlying bowel gases. Correlate clinically and with liver function tests to determine the need for further imaging with MRI/MRCP. Liver: No focal lesion identified. Within normal limits in parenchymal echogenicity. Portal vein is patent on color Doppler imaging with normal direction of blood flow towards the liver. Other: None. IMPRESSION: *Surgically absent gallbladder. *Dilated intra and extrahepatic bile ducts, as discussed above. Correlate clinically and with liver function tests to determine the need for further imaging with MRI/MRCP. Electronically Signed   By: Jules Schick M.D.   On: 02/14/2023 13:06   DG Chest 2 View Result Date: 02/14/2023 CLINICAL DATA:  Elevated white blood cell count. EXAM: CHEST - 2 VIEW COMPARISON:  05/07/2022 FINDINGS: Biapical scarring, stable. Heart is normal size. No acute confluent opacities or effusions. No acute bony abnormality. IMPRESSION: No active cardiopulmonary disease.  Electronically Signed   By: Charlett Nose M.D.   On: 02/14/2023 01:23   MR Brain W and Wo Contrast Result Date: 02/14/2023 EXAM: MRI HEAD WITHOUT AND WITH CONTRAST MRI CERVICAL SPINE WITHOUT AND WITH CONTRAST MRI CERVICAL THORACIC WITHOUT AND WITH CONTRAST MRI LUMBAR WITHOUT AND WITH CONTRAST CONTRAST:  4mL GADAVIST GADOBUTROL 1 MMOL/ML IV SOLN TECHNIQUE: Multiplanar, multiecho pulse sequences of the brain and surrounding structures, and cervical, thoracic and lumbar spine were obtained without and with intravenous contrast. COMPARISON:  None Available. FINDINGS: MRI HEAD FINDINGS Motion limited.  Within this limitation: Brain: No acute infarction, hemorrhage, hydrocephalus, extra-axial collection or mass lesion. Similar scattered T2/FLAIR hyperintensity in the white matter, compatible with chronic demyelination. No pathologic enhancement. Vascular: Major arterial flow voids are maintained. Skull and upper cervical spine: Normal marrow signal. Sinuses/Orbits: Clear sinuses.  No acute orbital findings. Other: No mastoid effusions. MRI CERVICAL SPINE FINDINGS Alignment: No substantial sagittal subluxation. Vertebrae: No fracture, evidence of discitis, or bone lesion. Cord: Similar appearance of multifocal T2 hyperintense cord lesions spanning C2-C5 on motion limited assessment. 6 no clearly new cord lesion. No abnormal cord enhancement. Posterior Fossa, vertebral arteries, paraspinal tissues: Negative. Disc levels: Facet and uncovertebral hypertrophy without significant canal or foraminal stenosis. MRI THORACIC SPINE FINDINGS Alignment: Exaggerated thoracic kyphosis. No substantial sagittal subluxation. Vertebrae: No fracture, evidence of discitis, or suspicious bone lesion. Cord: No substantial change in multifocal T2 hyperintensity in the cord at T5, T6-T7 and T8-T9. no pathologic enhancement. Paraspinal and other soft tissues: Unremarkable. Disc levels: No significant canal or foraminal stenosis. MRI LUMBAR SPINE  FINDINGS Alignment: No substantial sagittal subluxation. Vertebrae: No fracture, evidence of discitis, or bone lesion. Cord:  Normal. Paraspinal and other soft tissues: Unremarkable. Disc levels: T12-L1: No significant disc protrusion, foraminal stenosis, or canal stenosis. L1-L2: No significant disc protrusion, foraminal stenosis, or  canal stenosis. L2-L3: Mild disc bulging.  No significant stenosis. L3-L4: Mild disc bulging.  No significant stenosis. L4-L5: Mild disc bulging. Ligamentum flavum thickening facet arthropathy. No significant stenosis. L5-S1: Mild disc bulging and endplate spurring. Facet arthropathy. No significant stenosis. IMPRESSION: 1. Unchanged appearance/distribution of chronic demyelinating lesions intracranially and in the spinal cord. No enhancing lesions to suggest active demyelination. 2. No significant canal or foraminal stenosis. Electronically Signed   By: Feliberto Harts M.D.   On: 02/14/2023 00:09   MR Cervical Spine W and Wo Contrast Result Date: 02/14/2023 EXAM: MRI HEAD WITHOUT AND WITH CONTRAST MRI CERVICAL SPINE WITHOUT AND WITH CONTRAST MRI CERVICAL THORACIC WITHOUT AND WITH CONTRAST MRI LUMBAR WITHOUT AND WITH CONTRAST CONTRAST:  4mL GADAVIST GADOBUTROL 1 MMOL/ML IV SOLN TECHNIQUE: Multiplanar, multiecho pulse sequences of the brain and surrounding structures, and cervical, thoracic and lumbar spine were obtained without and with intravenous contrast. COMPARISON:  None Available. FINDINGS: MRI HEAD FINDINGS Motion limited.  Within this limitation: Brain: No acute infarction, hemorrhage, hydrocephalus, extra-axial collection or mass lesion. Similar scattered T2/FLAIR hyperintensity in the white matter, compatible with chronic demyelination. No pathologic enhancement. Vascular: Major arterial flow voids are maintained. Skull and upper cervical spine: Normal marrow signal. Sinuses/Orbits: Clear sinuses.  No acute orbital findings. Other: No mastoid effusions. MRI CERVICAL  SPINE FINDINGS Alignment: No substantial sagittal subluxation. Vertebrae: No fracture, evidence of discitis, or bone lesion. Cord: Similar appearance of multifocal T2 hyperintense cord lesions spanning C2-C5 on motion limited assessment. 6 no clearly new cord lesion. No abnormal cord enhancement. Posterior Fossa, vertebral arteries, paraspinal tissues: Negative. Disc levels: Facet and uncovertebral hypertrophy without significant canal or foraminal stenosis. MRI THORACIC SPINE FINDINGS Alignment: Exaggerated thoracic kyphosis. No substantial sagittal subluxation. Vertebrae: No fracture, evidence of discitis, or suspicious bone lesion. Cord: No substantial change in multifocal T2 hyperintensity in the cord at T5, T6-T7 and T8-T9. no pathologic enhancement. Paraspinal and other soft tissues: Unremarkable. Disc levels: No significant canal or foraminal stenosis. MRI LUMBAR SPINE FINDINGS Alignment: No substantial sagittal subluxation. Vertebrae: No fracture, evidence of discitis, or bone lesion. Cord:  Normal. Paraspinal and other soft tissues: Unremarkable. Disc levels: T12-L1: No significant disc protrusion, foraminal stenosis, or canal stenosis. L1-L2: No significant disc protrusion, foraminal stenosis, or canal stenosis. L2-L3: Mild disc bulging.  No significant stenosis. L3-L4: Mild disc bulging.  No significant stenosis. L4-L5: Mild disc bulging. Ligamentum flavum thickening facet arthropathy. No significant stenosis. L5-S1: Mild disc bulging and endplate spurring. Facet arthropathy. No significant stenosis. IMPRESSION: 1. Unchanged appearance/distribution of chronic demyelinating lesions intracranially and in the spinal cord. No enhancing lesions to suggest active demyelination. 2. No significant canal or foraminal stenosis. Electronically Signed   By: Feliberto Harts M.D.   On: 02/14/2023 00:09   MR THORACIC SPINE W WO CONTRAST Result Date: 02/14/2023 EXAM: MRI HEAD WITHOUT AND WITH CONTRAST MRI CERVICAL  SPINE WITHOUT AND WITH CONTRAST MRI CERVICAL THORACIC WITHOUT AND WITH CONTRAST MRI LUMBAR WITHOUT AND WITH CONTRAST CONTRAST:  4mL GADAVIST GADOBUTROL 1 MMOL/ML IV SOLN TECHNIQUE: Multiplanar, multiecho pulse sequences of the brain and surrounding structures, and cervical, thoracic and lumbar spine were obtained without and with intravenous contrast. COMPARISON:  None Available. FINDINGS: MRI HEAD FINDINGS Motion limited.  Within this limitation: Brain: No acute infarction, hemorrhage, hydrocephalus, extra-axial collection or mass lesion. Similar scattered T2/FLAIR hyperintensity in the white matter, compatible with chronic demyelination. No pathologic enhancement. Vascular: Major arterial flow voids are maintained. Skull and upper cervical spine: Normal marrow signal. Sinuses/Orbits:  Clear sinuses.  No acute orbital findings. Other: No mastoid effusions. MRI CERVICAL SPINE FINDINGS Alignment: No substantial sagittal subluxation. Vertebrae: No fracture, evidence of discitis, or bone lesion. Cord: Similar appearance of multifocal T2 hyperintense cord lesions spanning C2-C5 on motion limited assessment. 6 no clearly new cord lesion. No abnormal cord enhancement. Posterior Fossa, vertebral arteries, paraspinal tissues: Negative. Disc levels: Facet and uncovertebral hypertrophy without significant canal or foraminal stenosis. MRI THORACIC SPINE FINDINGS Alignment: Exaggerated thoracic kyphosis. No substantial sagittal subluxation. Vertebrae: No fracture, evidence of discitis, or suspicious bone lesion. Cord: No substantial change in multifocal T2 hyperintensity in the cord at T5, T6-T7 and T8-T9. no pathologic enhancement. Paraspinal and other soft tissues: Unremarkable. Disc levels: No significant canal or foraminal stenosis. MRI LUMBAR SPINE FINDINGS Alignment: No substantial sagittal subluxation. Vertebrae: No fracture, evidence of discitis, or bone lesion. Cord:  Normal. Paraspinal and other soft tissues:  Unremarkable. Disc levels: T12-L1: No significant disc protrusion, foraminal stenosis, or canal stenosis. L1-L2: No significant disc protrusion, foraminal stenosis, or canal stenosis. L2-L3: Mild disc bulging.  No significant stenosis. L3-L4: Mild disc bulging.  No significant stenosis. L4-L5: Mild disc bulging. Ligamentum flavum thickening facet arthropathy. No significant stenosis. L5-S1: Mild disc bulging and endplate spurring. Facet arthropathy. No significant stenosis. IMPRESSION: 1. Unchanged appearance/distribution of chronic demyelinating lesions intracranially and in the spinal cord. No enhancing lesions to suggest active demyelination. 2. No significant canal or foraminal stenosis. Electronically Signed   By: Feliberto Harts M.D.   On: 02/14/2023 00:09   MR Lumbar Spine W Wo Contrast Result Date: 02/14/2023 EXAM: MRI HEAD WITHOUT AND WITH CONTRAST MRI CERVICAL SPINE WITHOUT AND WITH CONTRAST MRI CERVICAL THORACIC WITHOUT AND WITH CONTRAST MRI LUMBAR WITHOUT AND WITH CONTRAST CONTRAST:  4mL GADAVIST GADOBUTROL 1 MMOL/ML IV SOLN TECHNIQUE: Multiplanar, multiecho pulse sequences of the brain and surrounding structures, and cervical, thoracic and lumbar spine were obtained without and with intravenous contrast. COMPARISON:  None Available. FINDINGS: MRI HEAD FINDINGS Motion limited.  Within this limitation: Brain: No acute infarction, hemorrhage, hydrocephalus, extra-axial collection or mass lesion. Similar scattered T2/FLAIR hyperintensity in the white matter, compatible with chronic demyelination. No pathologic enhancement. Vascular: Major arterial flow voids are maintained. Skull and upper cervical spine: Normal marrow signal. Sinuses/Orbits: Clear sinuses.  No acute orbital findings. Other: No mastoid effusions. MRI CERVICAL SPINE FINDINGS Alignment: No substantial sagittal subluxation. Vertebrae: No fracture, evidence of discitis, or bone lesion. Cord: Similar appearance of multifocal T2 hyperintense  cord lesions spanning C2-C5 on motion limited assessment. 6 no clearly new cord lesion. No abnormal cord enhancement. Posterior Fossa, vertebral arteries, paraspinal tissues: Negative. Disc levels: Facet and uncovertebral hypertrophy without significant canal or foraminal stenosis. MRI THORACIC SPINE FINDINGS Alignment: Exaggerated thoracic kyphosis. No substantial sagittal subluxation. Vertebrae: No fracture, evidence of discitis, or suspicious bone lesion. Cord: No substantial change in multifocal T2 hyperintensity in the cord at T5, T6-T7 and T8-T9. no pathologic enhancement. Paraspinal and other soft tissues: Unremarkable. Disc levels: No significant canal or foraminal stenosis. MRI LUMBAR SPINE FINDINGS Alignment: No substantial sagittal subluxation. Vertebrae: No fracture, evidence of discitis, or bone lesion. Cord:  Normal. Paraspinal and other soft tissues: Unremarkable. Disc levels: T12-L1: No significant disc protrusion, foraminal stenosis, or canal stenosis. L1-L2: No significant disc protrusion, foraminal stenosis, or canal stenosis. L2-L3: Mild disc bulging.  No significant stenosis. L3-L4: Mild disc bulging.  No significant stenosis. L4-L5: Mild disc bulging. Ligamentum flavum thickening facet arthropathy. No significant stenosis. L5-S1: Mild disc bulging and endplate spurring.  Facet arthropathy. No significant stenosis. IMPRESSION: 1. Unchanged appearance/distribution of chronic demyelinating lesions intracranially and in the spinal cord. No enhancing lesions to suggest active demyelination. 2. No significant canal or foraminal stenosis. Electronically Signed   By: Feliberto Harts M.D.   On: 02/14/2023 00:09        Impression    Elevated LFTs with previous fibrosis 3 and 4 on liver biopsy 2022, unknown etiology of elevated LFTs chronically Recent Labs  Lab 02/14/23 0500  AST 849*  ALT 373*  ALKPHOS 524*  BILITOT 1.0  PROT 7.0  ALBUMIN 3.4*  AST 849 ALT 373 Alkphos 524 TBili  1.0 02/14/2023 INR 1.0 R factor 2.1 with mixed injury pattern Ultrasound showed CBD 13 mm compared to 2 years ago at 8 mm, some intra hepatic ductal dilation. Patient with longstanding history of elevated LFTs previous workup showed that hemochromatosis however liver biopsy 2022 did not meet pattern for this did show fibrosis 3 and 4. Patient was started on Norvasc the last 6 to 8 months rare case studies showing hepatic toxicity can consider discontinuing and starting new blood pressure medication. Otherwise patient has no alcohol use, no recent antibiotic use no supplements.  Patient with no intrinsic function defects normal INR no HE on exam, normal platelets no evidence of portal hypertension or cirrhosis, mixed pattern and still very uncertain etiology of patient's overall elevated liver enzymes -CPK negative -Will get MRCP to evaluate CBD dilation, may need to consider EUS at some point pending MRCP -Will repeat hepatocellular workup with ANA, AMA, ASMA, kidney/liver antibody. -Pending MRCP may need to consider repeat liver biopsy -Patient has new insurance will see if it is excepted by [facility] so patient is able to go to local hepatologist. -I also emphasized with the patient well currently from the labs that we have had her liver does appear to be functioning at this point there is inflammation there was fibrosis in 2022 without fibrosis we will continue getting worse if we do not have an explanation for the continuing elevated liver function and labs.  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   Doree Albee  02/14/2023, 2:42 PM

## 2023-02-14 NOTE — ED Notes (Signed)
ED TO INPATIENT HANDOFF REPORT  ED Nurse Name and Phone #: Marylene Land, RN   S Name/Age/Gender Gabriela Campbell 58 y.o. female Room/Bed: WA17/WA17  Code Status   Code Status: Limited: Do not attempt resuscitation (DNR) -DNR-LIMITED -Do Not Intubate/DNI   Home/SNF/Other Home Patient oriented to: self, place, time, and situation Is this baseline? Yes   Triage Complete: Triage complete  Chief Complaint Multiple sclerosis exacerbation (HCC) [G35]  Triage Note Pt biba for MS flare. Does not takes meds for it. Reports increasing leg weakness, right leg. Unable to walk today   Allergies Allergies  Allergen Reactions   Dilaudid [Hydromorphone Hcl] Anaphylaxis   Fish-Derived Products Anaphylaxis   Ibuprofen Anaphylaxis   Iodine Anaphylaxis   Penicillins Anaphylaxis    Has patient had a PCN reaction causing immediate rash, facial/tongue/throat swelling, SOB or lightheadedness with hypotension:yes Has patient had a PCN reaction causing severe rash involving mucus membranes or skin necrosis: no Has patient had a PCN reaction that required hospitalization no Has patient had a PCN reaction occurring within the last 10 years: no If all of the above answers are "NO", then may proceed with Cephalosporin use.    Shellfish-Derived Products Anaphylaxis   Shrimp Extract Anaphylaxis   Baclofen Other (See Comments)    Makes the patient feel wobbly   Gabapentin Other (See Comments)    GI upset   Codeine Nausea Only   Tramadol Nausea And Vomiting    Level of Care/Admitting Diagnosis ED Disposition     ED Disposition  Admit   Condition  --   Comment  Hospital Area: [facility] [[id number]]  Level of Care: Med-Surg [16]  May place patient in observation at [facility] or [facility] if equivalent level of care is available:: No  Covid Evaluation: Asymptomatic - no recent exposure (last 10 days) testing not required  Diagnosis: Multiple sclerosis exacerbation [facility])  [[id number]]  Admitting Physician: Dolly Rias [[id number]]  Attending Physician: Dolly Rias [[id number]]          B Medical/Surgery History Past Medical History:  Diagnosis Date   Allergy    Multiple sclerosis (HCC)    Shingles    Weakness    Past Surgical History:  Procedure Laterality Date   ABDOMINAL HYSTERECTOMY     CHOLECYSTECTOMY       A IV Location/Drains/Wounds Patient Lines/Drains/Airways Status     Active Line/Drains/Airways     Name Placement date Placement time Site Days   Peripheral IV 02/13/23 20 G 1" Anterior;Right Forearm 02/13/23  2202  Forearm  1            Intake/Output Last 24 hours No intake or output data in the 24 hours ending 02/14/23 1248  Labs/Imaging Results for orders placed or performed during the hospital encounter of 02/13/23 (from the past 48 hours)  Urinalysis, w/ Reflex to Culture (Infection Suspected) -Urine, Clean Catch     Status: Abnormal   Collection Time: 02/13/23  1:39 AM  Result Value Ref Range   Specimen Source URINE, CLEAN CATCH    Color, Urine YELLOW YELLOW   APPearance CLOUDY (A) CLEAR   Specific Gravity, Urine 1.012 1.005 - 1.030   pH 7.0 5.0 - 8.0   Glucose, UA NEGATIVE NEGATIVE mg/dL   Hgb urine dipstick SMALL (A) NEGATIVE   Bilirubin Urine NEGATIVE NEGATIVE   Ketones, ur NEGATIVE NEGATIVE mg/dL   Protein, ur 30 (A) NEGATIVE mg/dL   Nitrite NEGATIVE NEGATIVE   Leukocytes,Ua SMALL (A) NEGATIVE  RBC / HPF 0-5 0 - 5 RBC/hpf   WBC, UA >50 0 - 5 WBC/hpf    Comment:        Reflex urine culture not performed if WBC <=10, OR if Squamous epithelial cells >5. If Squamous epithelial cells >5 suggest recollection.    Bacteria, UA RARE (A) NONE SEEN   Squamous Epithelial / HPF 0-5 0 - 5 /HPF   WBC Clumps PRESENT    Mucus PRESENT     Comment: Performed at [facility], [address] [place], [place] [address]  CBC with Differential     Status: Abnormal   Collection Time: 02/13/23  6:23  PM  Result Value Ref Range   WBC 13.9 (H) 4.0 - 10.5 K/uL   RBC 4.87 3.87 - 5.11 MIL/uL   Hemoglobin 15.7 (H) 12.0 - 15.0 g/dL   HCT 60.4 (H) 54.0 - 98.1 %   MCV 95.3 80.0 - 100.0 fL   MCH 32.2 26.0 - 34.0 pg   MCHC 33.8 30.0 - 36.0 g/dL   RDW 19.1 47.8 - 29.5 %   Platelets 194 150 - 400 K/uL   nRBC 0.0 0.0 - 0.2 %   Neutrophils Relative % 78 %   Neutro Abs 10.8 (H) 1.7 - 7.7 K/uL   Lymphocytes Relative 16 %   Lymphs Abs 2.3 0.7 - 4.0 K/uL   Monocytes Relative 5 %   Monocytes Absolute 0.7 0.1 - 1.0 K/uL   Eosinophils Relative 1 %   Eosinophils Absolute 0.1 0.0 - 0.5 K/uL   Basophils Relative 0 %   Basophils Absolute 0.1 0.0 - 0.1 K/uL   Immature Granulocytes 0 %   Abs Immature Granulocytes 0.05 0.00 - 0.07 K/uL    Comment: Performed at [facility], [address], [place], [place] [address]  Basic metabolic panel     Status: Abnormal   Collection Time: 02/13/23  6:23 PM  Result Value Ref Range   Sodium 139 135 - 145 mmol/L   Potassium 3.8 3.5 - 5.1 mmol/L   Chloride 102 98 - 111 mmol/L   CO2 25 22 - 32 mmol/L   Glucose, Bld 103 (H) 70 - 99 mg/dL    Comment: Glucose reference range applies only to samples taken after fasting for at least 8 hours.   BUN 16 6 - 20 mg/dL   Creatinine, Ser 8.65 0.44 - 1.00 mg/dL   Calcium 9.5 8.9 - 78.4 mg/dL   GFR, Estimated >69 >62 mL/min    Comment: (NOTE) Calculated using the CKD-EPI Creatinine Equation (2021)    Anion gap 12 5 - 15    Comment: Performed at [facility], [address] [place], [place] [address]  CK     Status: None   Collection Time: 02/13/23  9:56 PM  Result Value Ref Range   Total CK 61 38 - 234 U/L    Comment: Performed at [facility], [address], [place], [place] [address]  Vitamin B12     Status: Abnormal   Collection Time: 02/13/23  9:56 PM  Result Value Ref Range   Vitamin B-12 3,118 (H) 180 - 914 pg/mL    Comment: RESULT CONFIRMED BY MANUAL  DILUTION (NOTE) This assay is not validated for testing neonatal or myeloproliferative syndrome specimens for Vitamin B12 levels. Performed at [facility], [address], [place], [place] [address]   Basic metabolic panel     Status: Abnormal  Collection Time: 02/14/23  5:00 AM  Result Value Ref Range   Sodium 136 135 - 145 mmol/L   Potassium 3.9 3.5 - 5.1 mmol/L   Chloride 101 98 - 111 mmol/L   CO2 26 22 - 32 mmol/L   Glucose, Bld 106 (H) 70 - 99 mg/dL    Comment: Glucose reference range applies only to samples taken after fasting for at least 8 hours.   BUN 17 6 - 20 mg/dL   Creatinine, Ser 8.65 0.44 - 1.00 mg/dL   Calcium 9.2 8.9 - 78.4 mg/dL   GFR, Estimated >69 >62 mL/min    Comment: (NOTE) Calculated using the CKD-EPI Creatinine Equation (2021)    Anion gap 9 5 - 15    Comment: Performed at [facility], [address] [place], [place] [address]  CBC     Status: Abnormal   Collection Time: 02/14/23  5:00 AM  Result Value Ref Range   WBC 16.7 (H) 4.0 - 10.5 K/uL   RBC 4.53 3.87 - 5.11 MIL/uL   Hemoglobin 14.3 12.0 - 15.0 g/dL   HCT 13.2 44.0 - 10.2 %   MCV 96.5 80.0 - 100.0 fL   MCH 31.6 26.0 - 34.0 pg   MCHC 32.7 30.0 - 36.0 g/dL   RDW 72.5 36.6 - 44.0 %   Platelets 193 150 - 400 K/uL   nRBC 0.0 0.0 - 0.2 %    Comment: Performed at [facility], [address] [place], [place] [address]  Magnesium     Status: None   Collection Time: 02/14/23  5:00 AM  Result Value Ref Range   Magnesium 2.1 1.7 - 2.4 mg/dL    Comment: Performed at [facility] [address] [place], [place] [address]  Phosphorus     Status: None   Collection Time: 02/14/23  5:00 AM  Result Value Ref Range   Phosphorus 4.3 2.5 - 4.6 mg/dL    Comment: Performed at [facility], [address], [place], [place] [address]  Hepatic function panel     Status: Abnormal   Collection Time: 02/14/23   5:00 AM  Result Value Ref Range   Total Protein 7.0 6.5 - 8.1 g/dL   Albumin 3.4 (L) 3.5 - 5.0 g/dL   AST 332 (H) 15 - 41 U/L   ALT 373 (H) 0 - 44 U/L   Alkaline Phosphatase 524 (H) 38 - 126 U/L   Total Bilirubin 1.0 0.0 - 1.2 mg/dL   Bilirubin, Direct 0.5 (H) 0.0 - 0.2 mg/dL   Indirect Bilirubin 0.5 0.3 - 0.9 mg/dL    Comment: Performed at [facility], [address] [place], [place] [address]   DG Chest 2 View Result Date: 02/14/2023 CLINICAL DATA:  Elevated white blood cell count. EXAM: CHEST - 2 VIEW COMPARISON:  05/07/2022 FINDINGS: Biapical scarring, stable. Heart is normal size. No acute confluent opacities or effusions. No acute bony abnormality. IMPRESSION: No active cardiopulmonary disease. Electronically Signed   By: Charlett Nose M.D.   On: 02/14/2023 01:23   MR Brain W and Wo Contrast Result Date: 02/14/2023 EXAM: MRI HEAD WITHOUT AND WITH CONTRAST MRI CERVICAL SPINE WITHOUT AND WITH CONTRAST MRI CERVICAL THORACIC WITHOUT AND WITH CONTRAST MRI LUMBAR WITHOUT AND WITH CONTRAST CONTRAST:  4mL GADAVIST GADOBUTROL 1 MMOL/ML IV SOLN TECHNIQUE: Multiplanar, multiecho pulse sequences of the brain and surrounding structures, and cervical, thoracic and lumbar spine were obtained without and with intravenous  contrast. COMPARISON:  None Available. FINDINGS: MRI HEAD FINDINGS Motion limited.  Within this limitation: Brain: No acute infarction, hemorrhage, hydrocephalus, extra-axial collection or mass lesion. Similar scattered T2/FLAIR hyperintensity in the white matter, compatible with chronic demyelination. No pathologic enhancement. Vascular: Major arterial flow voids are maintained. Skull and upper cervical spine: Normal marrow signal. Sinuses/Orbits: Clear sinuses.  No acute orbital findings. Other: No mastoid effusions. MRI CERVICAL SPINE FINDINGS Alignment: No substantial sagittal subluxation. Vertebrae: No fracture, evidence of discitis, or bone lesion. Cord: Similar  appearance of multifocal T2 hyperintense cord lesions spanning C2-C5 on motion limited assessment. 6 no clearly new cord lesion. No abnormal cord enhancement. Posterior Fossa, vertebral arteries, paraspinal tissues: Negative. Disc levels: Facet and uncovertebral hypertrophy without significant canal or foraminal stenosis. MRI THORACIC SPINE FINDINGS Alignment: Exaggerated thoracic kyphosis. No substantial sagittal subluxation. Vertebrae: No fracture, evidence of discitis, or suspicious bone lesion. Cord: No substantial change in multifocal T2 hyperintensity in the cord at T5, T6-T7 and T8-T9. no pathologic enhancement. Paraspinal and other soft tissues: Unremarkable. Disc levels: No significant canal or foraminal stenosis. MRI LUMBAR SPINE FINDINGS Alignment: No substantial sagittal subluxation. Vertebrae: No fracture, evidence of discitis, or bone lesion. Cord:  Normal. Paraspinal and other soft tissues: Unremarkable. Disc levels: T12-L1: No significant disc protrusion, foraminal stenosis, or canal stenosis. L1-L2: No significant disc protrusion, foraminal stenosis, or canal stenosis. L2-L3: Mild disc bulging.  No significant stenosis. L3-L4: Mild disc bulging.  No significant stenosis. L4-L5: Mild disc bulging. Ligamentum flavum thickening facet arthropathy. No significant stenosis. L5-S1: Mild disc bulging and endplate spurring. Facet arthropathy. No significant stenosis. IMPRESSION: 1. Unchanged appearance/distribution of chronic demyelinating lesions intracranially and in the spinal cord. No enhancing lesions to suggest active demyelination. 2. No significant canal or foraminal stenosis. Electronically Signed   By: Feliberto Harts M.D.   On: 02/14/2023 00:09   MR Cervical Spine W and Wo Contrast Result Date: 02/14/2023 EXAM: MRI HEAD WITHOUT AND WITH CONTRAST MRI CERVICAL SPINE WITHOUT AND WITH CONTRAST MRI CERVICAL THORACIC WITHOUT AND WITH CONTRAST MRI LUMBAR WITHOUT AND WITH CONTRAST CONTRAST:  4mL  GADAVIST GADOBUTROL 1 MMOL/ML IV SOLN TECHNIQUE: Multiplanar, multiecho pulse sequences of the brain and surrounding structures, and cervical, thoracic and lumbar spine were obtained without and with intravenous contrast. COMPARISON:  None Available. FINDINGS: MRI HEAD FINDINGS Motion limited.  Within this limitation: Brain: No acute infarction, hemorrhage, hydrocephalus, extra-axial collection or mass lesion. Similar scattered T2/FLAIR hyperintensity in the white matter, compatible with chronic demyelination. No pathologic enhancement. Vascular: Major arterial flow voids are maintained. Skull and upper cervical spine: Normal marrow signal. Sinuses/Orbits: Clear sinuses.  No acute orbital findings. Other: No mastoid effusions. MRI CERVICAL SPINE FINDINGS Alignment: No substantial sagittal subluxation. Vertebrae: No fracture, evidence of discitis, or bone lesion. Cord: Similar appearance of multifocal T2 hyperintense cord lesions spanning C2-C5 on motion limited assessment. 6 no clearly new cord lesion. No abnormal cord enhancement. Posterior Fossa, vertebral arteries, paraspinal tissues: Negative. Disc levels: Facet and uncovertebral hypertrophy without significant canal or foraminal stenosis. MRI THORACIC SPINE FINDINGS Alignment: Exaggerated thoracic kyphosis. No substantial sagittal subluxation. Vertebrae: No fracture, evidence of discitis, or suspicious bone lesion. Cord: No substantial change in multifocal T2 hyperintensity in the cord at T5, T6-T7 and T8-T9. no pathologic enhancement. Paraspinal and other soft tissues: Unremarkable. Disc levels: No significant canal or foraminal stenosis. MRI LUMBAR SPINE FINDINGS Alignment: No substantial sagittal subluxation. Vertebrae: No fracture, evidence of discitis, or bone lesion. Cord:  Normal. Paraspinal and other soft tissues:  Unremarkable. Disc levels: T12-L1: No significant disc protrusion, foraminal stenosis, or canal stenosis. L1-L2: No significant disc  protrusion, foraminal stenosis, or canal stenosis. L2-L3: Mild disc bulging.  No significant stenosis. L3-L4: Mild disc bulging.  No significant stenosis. L4-L5: Mild disc bulging. Ligamentum flavum thickening facet arthropathy. No significant stenosis. L5-S1: Mild disc bulging and endplate spurring. Facet arthropathy. No significant stenosis. IMPRESSION: 1. Unchanged appearance/distribution of chronic demyelinating lesions intracranially and in the spinal cord. No enhancing lesions to suggest active demyelination. 2. No significant canal or foraminal stenosis. Electronically Signed   By: Feliberto Harts M.D.   On: 02/14/2023 00:09   MR THORACIC SPINE W WO CONTRAST Result Date: 02/14/2023 EXAM: MRI HEAD WITHOUT AND WITH CONTRAST MRI CERVICAL SPINE WITHOUT AND WITH CONTRAST MRI CERVICAL THORACIC WITHOUT AND WITH CONTRAST MRI LUMBAR WITHOUT AND WITH CONTRAST CONTRAST:  4mL GADAVIST GADOBUTROL 1 MMOL/ML IV SOLN TECHNIQUE: Multiplanar, multiecho pulse sequences of the brain and surrounding structures, and cervical, thoracic and lumbar spine were obtained without and with intravenous contrast. COMPARISON:  None Available. FINDINGS: MRI HEAD FINDINGS Motion limited.  Within this limitation: Brain: No acute infarction, hemorrhage, hydrocephalus, extra-axial collection or mass lesion. Similar scattered T2/FLAIR hyperintensity in the white matter, compatible with chronic demyelination. No pathologic enhancement. Vascular: Major arterial flow voids are maintained. Skull and upper cervical spine: Normal marrow signal. Sinuses/Orbits: Clear sinuses.  No acute orbital findings. Other: No mastoid effusions. MRI CERVICAL SPINE FINDINGS Alignment: No substantial sagittal subluxation. Vertebrae: No fracture, evidence of discitis, or bone lesion. Cord: Similar appearance of multifocal T2 hyperintense cord lesions spanning C2-C5 on motion limited assessment. 6 no clearly new cord lesion. No abnormal cord enhancement. Posterior  Fossa, vertebral arteries, paraspinal tissues: Negative. Disc levels: Facet and uncovertebral hypertrophy without significant canal or foraminal stenosis. MRI THORACIC SPINE FINDINGS Alignment: Exaggerated thoracic kyphosis. No substantial sagittal subluxation. Vertebrae: No fracture, evidence of discitis, or suspicious bone lesion. Cord: No substantial change in multifocal T2 hyperintensity in the cord at T5, T6-T7 and T8-T9. no pathologic enhancement. Paraspinal and other soft tissues: Unremarkable. Disc levels: No significant canal or foraminal stenosis. MRI LUMBAR SPINE FINDINGS Alignment: No substantial sagittal subluxation. Vertebrae: No fracture, evidence of discitis, or bone lesion. Cord:  Normal. Paraspinal and other soft tissues: Unremarkable. Disc levels: T12-L1: No significant disc protrusion, foraminal stenosis, or canal stenosis. L1-L2: No significant disc protrusion, foraminal stenosis, or canal stenosis. L2-L3: Mild disc bulging.  No significant stenosis. L3-L4: Mild disc bulging.  No significant stenosis. L4-L5: Mild disc bulging. Ligamentum flavum thickening facet arthropathy. No significant stenosis. L5-S1: Mild disc bulging and endplate spurring. Facet arthropathy. No significant stenosis. IMPRESSION: 1. Unchanged appearance/distribution of chronic demyelinating lesions intracranially and in the spinal cord. No enhancing lesions to suggest active demyelination. 2. No significant canal or foraminal stenosis. Electronically Signed   By: Feliberto Harts M.D.   On: 02/14/2023 00:09   MR Lumbar Spine W Wo Contrast Result Date: 02/14/2023 EXAM: MRI HEAD WITHOUT AND WITH CONTRAST MRI CERVICAL SPINE WITHOUT AND WITH CONTRAST MRI CERVICAL THORACIC WITHOUT AND WITH CONTRAST MRI LUMBAR WITHOUT AND WITH CONTRAST CONTRAST:  4mL GADAVIST GADOBUTROL 1 MMOL/ML IV SOLN TECHNIQUE: Multiplanar, multiecho pulse sequences of the brain and surrounding structures, and cervical, thoracic and lumbar spine were  obtained without and with intravenous contrast. COMPARISON:  None Available. FINDINGS: MRI HEAD FINDINGS Motion limited.  Within this limitation: Brain: No acute infarction, hemorrhage, hydrocephalus, extra-axial collection or mass lesion. Similar scattered T2/FLAIR hyperintensity in the white matter, compatible with  chronic demyelination. No pathologic enhancement. Vascular: Major arterial flow voids are maintained. Skull and upper cervical spine: Normal marrow signal. Sinuses/Orbits: Clear sinuses.  No acute orbital findings. Other: No mastoid effusions. MRI CERVICAL SPINE FINDINGS Alignment: No substantial sagittal subluxation. Vertebrae: No fracture, evidence of discitis, or bone lesion. Cord: Similar appearance of multifocal T2 hyperintense cord lesions spanning C2-C5 on motion limited assessment. 6 no clearly new cord lesion. No abnormal cord enhancement. Posterior Fossa, vertebral arteries, paraspinal tissues: Negative. Disc levels: Facet and uncovertebral hypertrophy without significant canal or foraminal stenosis. MRI THORACIC SPINE FINDINGS Alignment: Exaggerated thoracic kyphosis. No substantial sagittal subluxation. Vertebrae: No fracture, evidence of discitis, or suspicious bone lesion. Cord: No substantial change in multifocal T2 hyperintensity in the cord at T5, T6-T7 and T8-T9. no pathologic enhancement. Paraspinal and other soft tissues: Unremarkable. Disc levels: No significant canal or foraminal stenosis. MRI LUMBAR SPINE FINDINGS Alignment: No substantial sagittal subluxation. Vertebrae: No fracture, evidence of discitis, or bone lesion. Cord:  Normal. Paraspinal and other soft tissues: Unremarkable. Disc levels: T12-L1: No significant disc protrusion, foraminal stenosis, or canal stenosis. L1-L2: No significant disc protrusion, foraminal stenosis, or canal stenosis. L2-L3: Mild disc bulging.  No significant stenosis. L3-L4: Mild disc bulging.  No significant stenosis. L4-L5: Mild disc bulging.  Ligamentum flavum thickening facet arthropathy. No significant stenosis. L5-S1: Mild disc bulging and endplate spurring. Facet arthropathy. No significant stenosis. IMPRESSION: 1. Unchanged appearance/distribution of chronic demyelinating lesions intracranially and in the spinal cord. No enhancing lesions to suggest active demyelination. 2. No significant canal or foraminal stenosis. Electronically Signed   By: Feliberto Harts M.D.   On: 02/14/2023 00:09    Pending Labs Unresulted Labs (From admission, onward)     Start     Ordered   02/13/23 0139  Urine Culture  Once,   R        02/13/23 0139            Vitals/Pain Today's Vitals   02/14/23 1005 02/14/23 1005 02/14/23 1008 02/14/23 1104  BP: 101/82 101/82    Pulse:  96    Resp:  18    Temp:  98.1 F (36.7 C)    TempSrc:      SpO2:  99%    Weight:      Height:      PainSc:   9  7     Isolation Precautions No active isolations  Medications Medications  lidocaine (LIDODERM) 5 % 1 patch (1 patch Transdermal Patch Applied 02/14/23 0233)  enoxaparin (LOVENOX) injection 30 mg (30 mg Subcutaneous Given 02/14/23 1007)  acetaminophen (TYLENOL) tablet 1,000 mg (has no administration in time range)  melatonin tablet 6 mg (has no administration in time range)  polyethylene glycol (MIRALAX / GLYCOLAX) packet 17 g (has no administration in time range)  methocarbamol (ROBAXIN) tablet 500 mg (has no administration in time range)  oxyCODONE (Oxy IR/ROXICODONE) immediate release tablet 2.5 mg ( Oral See Alternative 02/14/23 1012)    Or  oxyCODONE (Oxy IR/ROXICODONE) immediate release tablet 5 mg (5 mg Oral Given 02/14/23 1012)  amLODipine (NORVASC) tablet 5 mg (0 mg Oral Hold 02/14/23 1005)  thiamine (VITAMIN B1) tablet 100 mg (100 mg Oral Given 02/14/23 1006)  morphine (PF) 4 MG/ML injection 4 mg (4 mg Intravenous Given 02/13/23 2203)  ondansetron (ZOFRAN) injection 4 mg (4 mg Intravenous Given 02/13/23 2202)  LORazepam (ATIVAN) injection 1  mg (1 mg Intravenous Given 02/13/23 2223)  gadobutrol (GADAVIST) 1 MMOL/ML injection 4 mL (4 mLs  Intravenous Contrast Given 02/13/23 2337)  fosfomycin (MONUROL) packet 3 g (3 g Oral Given 02/14/23 0232)  oxyCODONE (Oxy IR/ROXICODONE) immediate release tablet 10 mg (10 mg Oral Given 02/14/23 0231)    Mobility walks with device     Focused Assessments Decreased muscle strength. Weakness   R Recommendations: See Admitting Provider Note  Report given to:   Additional Notes:

## 2023-02-14 NOTE — Assessment & Plan Note (Addendum)
Stable. Hold HTN meds for SBP <120.  02-16-2023 reduce dose of norvasc to 2.5 mg daily.

## 2023-02-14 NOTE — Progress Notes (Signed)
   After further discussion with GI, will continue with hospitalization to further evaluate pt's chronically elevated LFTs.  Pt's UTI has been treated with po fosfomycin.  Carollee Herter, DO [facility]

## 2023-02-14 NOTE — Assessment & Plan Note (Addendum)
02-14-2023 Hx of elevated LFTs. But usually AST/ALT are not this high. Check RUQ U/S. Last liver biopsy in 06-2020 showed "Her last liver biopsy showed concern for chronic hepatitis with grade 2 or 3 fibrosis.". she does have hx of hemochromatosis.  Pt was referred to [facility] but never went to appointment.  Appears pt referred to [facility] transplant center but was rejected due to ineligibility.    02-15-2023 thanks for GI consult. MRCP negative for biliary obstruction. GI has ordered liver biopsy today and other labs are pending. Plan for DC tomorrow.  02-16-2023 LFTs have improved but not normal. Stable for DC. F/U with GI regarding liver biopsy results and rest of labs ordered.

## 2023-02-14 NOTE — ED Notes (Signed)
Patient had urinated on herself. Linen changed, patient has gown on pulled up in bed and breakfast tray given.

## 2023-02-14 NOTE — ED Notes (Signed)
Report given to [facility].

## 2023-02-14 NOTE — Assessment & Plan Note (Addendum)
Chronic. 02-16-2023 have provide small supply of oxycodone for her LE pain. F/u with PCP or pain clinic for continued Rx.

## 2023-02-14 NOTE — ED Notes (Signed)
Physical Therapy at bedside.

## 2023-02-14 NOTE — ED Provider Notes (Signed)
  Physical Exam  BP (!) 125/101   Pulse 94   Temp 98 F (36.7 C) (Oral)   Resp 16   Ht 5\' 2"  (1.575 m)   Wt 43.1 kg   SpO2 99%   BMI 17.38 kg/m   Physical Exam  Procedures  Procedures  ED Course / MDM    Medical Decision Making Amount and/or Complexity of Data Reviewed Labs: ordered. Radiology: ordered.  Risk Prescription drug management. Decision regarding hospitalization.   Patient care assumed at shift handoff from [employer], PA-C.  Please see her note for full physical and history details.  In short, 58 year old female patient with history of MS, elevated LFTs, not currently on any specific medication for MS presents due to weakness.  She reports using a walker at baseline.  She states that upon awakening Wednesday morning she had diffuse pain to bilateral legs and throughout her spine.  She felt that her walk was off and that she had a "dry" her right leg.  She feels this was normal on Tuesday night.  She denies recent falls or injuries, denies fever, nausea, vomiting, chest pain, shortness of breath, abdominal pain.  She does endorse history of significant muscle spasms in extremities.  Physical exam with grossly intact cranial nerves II through XII, equal hand strength bilaterally, left leg lifted off bed by approximately 6 inches, only able to lift right leg barely above the bed.  Unable to hold either against resistance.  Bilateral hyperreflexic at patella.  At time of assumption of care MRIs of the spine were pending.  MRIs did not show active demyelination but showed history consistent with underlying MS.  Chest x-ray ordered to evaluate for possible infectious cause which was negative.  UA positive for signs of infection with clumped WBC, bacteria, leukocytes.  Due to history of reported severe penicillin allergy patient was treated with dose of fosfomycin.  Since patient continues to be unable to walk due to weakness will request admission with hospitalist service  for further management.  Dr. Lazarus Salines agrees to see patient for admission       Pamala Duffel 02/14/23 [id number]    Tilden Fossa, MD 02/14/23 [phone]

## 2023-02-14 NOTE — Plan of Care (Signed)

## 2023-02-14 NOTE — Hospital Course (Addendum)
HPI: Gabriela Campbell is a 58 y.o. female with hx of relapsing remitting multiple sclerosis, hemochromatosis, abnormal LFT, hypertension, mood disorder, who presented due to worsening weakness and sensory changes in her lower extremities.  Reports that these changes have been ongoing for the past 3 to 4 days.  Describes weakness in her hip flexors and around the knee, paresthesias along the anterior thighs, worsening numbness of both feet.  Also has paresthesias in both hands.  Otherwise no history of headache, speech change, vision changes, head injury.  Denies any dysuria or recent urinary changes.  Did have an episode of vomiting about 2 weeks ago but no other GI symptoms at this time.  No fevers.   Significant Events: Admitted 02/13/2023 for UTI   Significant Labs: WBC 13.9, Hgb 15.7, plt 194 Na 139, K 3.8, CO2 of 25, BUN 16, scr 0.67 UA cloudy, small LE, negative nitrite, WBC clumps  Significant Imaging Studies: MRI brain, c-spine, t-spine, L-spine shows  Unchanged appearance/distribution of chronic demyelinating lesions intracranially and in the spinal cord. No enhancing lesions to suggest active demyelination. . No significant canal or foraminal stenosis. CXR No active cardiopulmonary disease  MRCP negative for bilary obstruction Antibiotic Therapy: Anti-infectives (From admission, onward)    Start     Dose/Rate Route Frequency Ordered Stop   02/14/23 0230  fosfomycin (MONUROL) packet 3 g        3 g Oral  Once 02/14/23 0220 02/14/23 0232       Procedures: 02-15-2023 liver biopsy  Consultants: GI

## 2023-02-14 NOTE — Assessment & Plan Note (Addendum)
02-15-2023 Treated with po fosfomycin. Likely the cause of her weakness. NOT a MS exacerbation.  02-16-2023 no growth on urine cx.

## 2023-02-14 NOTE — Evaluation (Signed)
Physical Therapy Evaluation Patient Details Name: Gabriela Campbell MRN: [id number] DOB: 04/13/1965 Today's Date: 02/14/2023  History of Present Illness  Gabriela Campbell is a 58 y.o. female who presented 02/13/23  due to worsening weakness and sensory changes in her lower extremities. Found to have UTI with presumed pseudorelapse of multiple sclerosis. PMH: relapsing remitting multiple sclerosis, hemochromatosis, abnormal LFT, hypertension, mood disorder  Clinical Impression  Pt admitted with above diagnosis.  Pt currently with functional limitations due to the deficits listed below (see PT Problem List). Pt will benefit from acute skilled PT to increase their independence and safety with mobility to allow discharge.    The patient reports that her legs are weaker. Patient ambulated with RW and min mod support initially, knees flexed. Patient reports that she does tend  to  have knees flexed with ambulation. Patient transferred multiple times to multiple surfaces with Contact guard.   Patient resides with her father, reports being mod. Independent using a rollator, performs ADL's independently. Discussed  possible post acute rehab in a facility, patient declining, accepts HHPT.  Recommend a narrow adult WC to use when weakness strikes  for safer  mobility in her home.  Patient agreeable.  Hopefully, patient will return to near  baseline as medical  improves.  Patient remains a high fall risk, discussed limiting gait, using the WC  .       If plan is discharge home, recommend the following: A little help with walking and/or transfers;Assistance with cooking/housework;Assist for transportation;Help with stairs or ramp for entrance   Can travel by private vehicle        Equipment Recommendations Wheelchair cushion (measurements PT);Wheelchair (measurements PT)  Recommendations for Other Services       Functional Status Assessment Patient has had a recent decline in their functional  status and demonstrates the ability to make significant improvements in function in a reasonable and predictable amount of time.     Precautions / Restrictions Precautions Precautions: Fall Restrictions Weight Bearing Restrictions Per Provider Order: No      Mobility  Bed Mobility Overal bed mobility: Needs Assistance Bed Mobility: Supine to Sit, Sit to Supine     Supine to sit: Supervision Sit to supine: Supervision   General bed mobility comments: patient's legs  tend to extend when moving to sitting and retun to supine, the stratcher is high and patient is unable  plant feet to scoot onto bed so she tends to start lying down when getting up onto the stretcher and her  legs extend,    Transfers Overall transfer level: Needs assistance Equipment used: Rolling walker (2 wheels), None Transfers: Sit to/from Stand, Bed to chair/wheelchair/BSC Sit to Stand: Contact guard assist Stand pivot transfers: Contact guard assist Step pivot transfers: Contact guard assist       General transfer comment: patient holds onto bed., reaches for surface or rail- transferred to a chair, a transport chair and toilet then back back from each surface    Ambulation/Gait Ambulation/Gait assistance: Mod assist, Min assist Gait Distance (Feet): 15 Feet (x 3) Assistive device: Rolling walker (2 wheels) Gait Pattern/deviations: Step-to pattern, Staggering right, Staggering left, Trunk flexed, Ataxic, Decreased step length - right, Decreased step length - left Gait velocity: decr     General Gait Details: both knees flexed, ataxia  Stairs            Wheelchair Mobility     Tilt Bed    Modified Rankin (Stroke Patients Only)  Balance Overall balance assessment: History of Falls, Needs assistance Sitting-balance support: No upper extremity supported, Feet supported Sitting balance-Leahy Scale: Fair Sitting balance - Comments: when sitting , tends to  at times Postural control:  Posterior lean Standing balance support: No upper extremity supported, During functional activity, Reliant on assistive device for balance Standing balance-Leahy Scale: Poor Standing balance comment: stands at  sink, knees flexed to balance and wash hands                             Pertinent Vitals/Pain Pain Assessment Pain Assessment: Faces Faces Pain Scale: Hurts even more Pain Location: thighs and back Pain Descriptors / Indicators: Aching Pain Intervention(s): Monitored during session, Premedicated before session    Home Living Family/patient expects to be discharged to:: Private residence Living Arrangements: Parent Available Help at Discharge: Family;Available 24 hours/day Type of Home: House Home Access: Stairs to enter Entrance Stairs-Rails: None Entrance Stairs-Number of Steps: 4   Home Layout: One level Home Equipment: Pharmacist, hospital (2 wheels);Cane - single point      Prior Function Prior Level of Function : Independent/Modified Independent             Mobility Comments: Pt ambulates with RW for in home, limited community distance now. reports multiple falls in last 6 months (legs give out) ADLs Comments: Reports independent adls;     Extremity/Trunk Assessment   Upper Extremity Assessment Upper Extremity Assessment:  (reports decreased senasation in hands, able to use both functionally)    Lower Extremity Assessment Lower Extremity Assessment: LLE deficits/detail;RLE deficits/detail RLE Deficits / Details: plantarflex 3, dorsiflex 3+, hip flex 2, knee ext 3/ LLE Deficits / Details: similar  as right    Cervical / Trunk Assessment Cervical / Trunk Assessment: Kyphotic;Other exceptions Cervical / Trunk Exceptions: noted trunk sway when sitting  Communication   Communication Communication: No apparent difficulties    Cognition Arousal: Alert Behavior During Therapy: WFL for tasks assessed/performed   PT - Cognitive  impairments: No apparent impairments                         Following commands: Intact       Cueing       General Comments      Exercises     Assessment/Plan    PT Assessment Patient needs continued PT services  PT Problem List Decreased strength;Decreased balance;Decreased knowledge of precautions;Decreased range of motion;Decreased mobility;Decreased knowledge of use of DME;Pain;Decreased activity tolerance;Impaired sensation       PT Treatment Interventions DME instruction;Therapeutic activities;Gait training;Therapeutic exercise;Patient/family education;Balance training;Functional mobility training    PT Goals (Current goals can be found in the Care Plan section)  Acute Rehab PT Goals Patient Stated Goal: to go home PT Goal Formulation: With patient Time For Goal Achievement: 02/28/23 Potential to Achieve Goals: Good    Frequency Min 1X/week     Co-evaluation               AM-PAC PT "6 Clicks" Mobility  Outcome Measure Help needed turning from your back to your side while in a flat bed without using bedrails?: None Help needed moving from lying on your back to sitting on the side of a flat bed without using bedrails?: None Help needed moving to and from a bed to a chair (including a wheelchair)?: None Help needed standing up from a chair using your arms (e.g., wheelchair or bedside chair)?:  A Little Help needed to walk in hospital room?: A Lot Help needed climbing 3-5 steps with a railing? : Total 6 Click Score: 6    End of Session Equipment Utilized During Treatment: Gait belt Activity Tolerance: Patient tolerated treatment well Patient left: in bed;with call bell/phone within reach Nurse Communication: Mobility status PT Visit Diagnosis: Unsteadiness on feet (R26.81);Muscle weakness (generalized) (M62.81);Other symptoms and signs involving the nervous system (R29.898)    Time: 1610-9604 PT Time Calculation (min) (ACUTE ONLY): 43  min   Charges:   PT Evaluation $PT Eval Low Complexity: 1 Low PT Treatments $Gait Training: 8-22 mins $Self Care/Home Management: 8-22 PT General Charges $$ ACUTE PT VISIT: 1 Visit         Blanchard Kelch PT Acute Rehabilitation Services Office [phone] Weekend [phone]   Rada Hay 02/14/2023, 12:59 PM

## 2023-02-15 ENCOUNTER — Observation Stay (HOSPITAL_COMMUNITY): Payer: Medicare PPO

## 2023-02-15 ENCOUNTER — Telehealth: Payer: Self-pay

## 2023-02-15 DIAGNOSIS — I10 Essential (primary) hypertension: Secondary | ICD-10-CM | POA: Diagnosis not present

## 2023-02-15 DIAGNOSIS — R932 Abnormal findings on diagnostic imaging of liver and biliary tract: Secondary | ICD-10-CM | POA: Diagnosis not present

## 2023-02-15 DIAGNOSIS — G35 Multiple sclerosis: Secondary | ICD-10-CM | POA: Diagnosis not present

## 2023-02-15 DIAGNOSIS — R7989 Other specified abnormal findings of blood chemistry: Secondary | ICD-10-CM | POA: Diagnosis not present

## 2023-02-15 DIAGNOSIS — N3000 Acute cystitis without hematuria: Secondary | ICD-10-CM | POA: Diagnosis not present

## 2023-02-15 DIAGNOSIS — R7401 Elevation of levels of liver transaminase levels: Secondary | ICD-10-CM

## 2023-02-15 LAB — COMPREHENSIVE METABOLIC PANEL
ALT: 576 U/L — ABNORMAL HIGH (ref 0–44)
AST: 452 U/L — ABNORMAL HIGH (ref 15–41)
Albumin: 3.5 g/dL (ref 3.5–5.0)
Alkaline Phosphatase: 575 U/L — ABNORMAL HIGH (ref 38–126)
Anion gap: 10 (ref 5–15)
BUN: 23 mg/dL — ABNORMAL HIGH (ref 6–20)
CO2: 25 mmol/L (ref 22–32)
Calcium: 9.1 mg/dL (ref 8.9–10.3)
Chloride: 101 mmol/L (ref 98–111)
Creatinine, Ser: 0.83 mg/dL (ref 0.44–1.00)
GFR, Estimated: >60 mL/min (ref >60)
Glucose, Bld: 100 mg/dL — ABNORMAL HIGH (ref 70–99)
Potassium: 4.6 mmol/L (ref 3.5–5.1)
Sodium: 136 mmol/L (ref 135–145)
Total Bilirubin: 0.4 mg/dL (ref 0.0–1.2)
Total Protein: 7 g/dL (ref 6.5–8.1)

## 2023-02-15 LAB — ANTI-MICROSOMAL ANTIBODY LIVER / KIDNEY: LKM1 Ab: 0.5 U (ref 0.0–20.0)

## 2023-02-15 LAB — MITOCHONDRIAL ANTIBODIES: Mitochondrial M2 Ab, IgG: <20 U (ref 0.0–20.0)

## 2023-02-15 LAB — CBC WITH DIFFERENTIAL/PLATELET
Abs Immature Granulocytes: 0.09 10*3/uL — ABNORMAL HIGH (ref 0.00–0.07)
Basophils Absolute: 0 10*3/uL (ref 0.0–0.1)
Basophils Relative: 1 %
Eosinophils Absolute: 0.1 10*3/uL (ref 0.0–0.5)
Eosinophils Relative: 1 %
HCT: 44.2 % (ref 36.0–46.0)
Hemoglobin: 14.3 g/dL (ref 12.0–15.0)
Immature Granulocytes: 1 %
Lymphocytes Relative: 33 %
Lymphs Abs: 2.8 10*3/uL (ref 0.7–4.0)
MCH: 31.7 pg (ref 26.0–34.0)
MCHC: 32.4 g/dL (ref 30.0–36.0)
MCV: 98 fL (ref 80.0–100.0)
Monocytes Absolute: 0.6 10*3/uL (ref 0.1–1.0)
Monocytes Relative: 7 %
Neutro Abs: 4.9 10*3/uL (ref 1.7–7.7)
Neutrophils Relative %: 57 %
Platelets: 165 10*3/uL (ref 150–400)
RBC: 4.51 MIL/uL (ref 3.87–5.11)
RDW: 13.6 % (ref 11.5–15.5)
WBC: 8.5 10*3/uL (ref 4.0–10.5)
nRBC: 0 % (ref 0.0–0.2)

## 2023-02-15 LAB — ANA W/REFLEX IF POSITIVE: Anti Nuclear Antibody (ANA): NEGATIVE

## 2023-02-15 LAB — IGG: IgG (Immunoglobin G), Serum: 919 mg/dL (ref 586–1602)

## 2023-02-15 LAB — ANTI-SMOOTH MUSCLE ANTIBODY, IGG: F-Actin IgG: 3 U (ref 0–19)

## 2023-02-15 MED ORDER — MIDAZOLAM HCL 2 MG/2ML IJ SOLN
INTRAMUSCULAR | Status: AC | PRN
  Administered 2023-02-15: 1 mg via INTRAVENOUS

## 2023-02-15 MED ORDER — FENTANYL CITRATE (PF) 100 MCG/2ML IJ SOLN
INTRAMUSCULAR | Status: AC
  Filled 2023-02-15: qty 2

## 2023-02-15 MED ORDER — MIDAZOLAM HCL 2 MG/2ML IJ SOLN
INTRAMUSCULAR | Status: AC | PRN
Start: 2023-02-15 — End: 2023-02-15
  Administered 2023-02-15: 1 mg via INTRAVENOUS

## 2023-02-15 MED ORDER — LIDOCAINE HCL 1 % IJ SOLN
INTRAMUSCULAR | Status: AC | PRN
  Administered 2023-02-15: 10 mL via INTRADERMAL

## 2023-02-15 MED ORDER — MORPHINE SULFATE (PF) 2 MG/ML IV SOLN
INTRAVENOUS | Status: AC
  Filled 2023-02-15: qty 2

## 2023-02-15 MED ORDER — FENTANYL CITRATE (PF) 100 MCG/2ML IJ SOLN
INTRAMUSCULAR | Status: AC | PRN
  Administered 2023-02-15: 50 ug via INTRAVENOUS

## 2023-02-15 MED ORDER — MORPHINE SULFATE (PF) 4 MG/ML IV SOLN
4.0000 mg | Freq: Once | INTRAVENOUS | Status: AC
  Administered 2023-02-15: 4 mg via INTRAVENOUS

## 2023-02-15 MED ORDER — LIDOCAINE HCL 1 % IJ SOLN
INTRAMUSCULAR | Status: AC
  Filled 2023-02-15: qty 20

## 2023-02-15 MED ORDER — GELATIN ABSORBABLE 12-7 MM EX MISC
CUTANEOUS | Status: AC
Start: 2023-02-15 — End: ?
  Filled 2023-02-15: qty 1

## 2023-02-15 MED ORDER — MIDAZOLAM HCL 2 MG/2ML IJ SOLN
INTRAMUSCULAR | Status: AC
  Filled 2023-02-15: qty 4

## 2023-02-15 MED ORDER — GELATIN ABSORBABLE 12-7 MM EX MISC
CUTANEOUS | Status: AC | PRN
  Administered 2023-02-15: 1 via TOPICAL

## 2023-02-15 NOTE — Telephone Encounter (Signed)
-----   Message from Doree Albee sent at 02/15/2023  8:53 AM EST ----- Regarding: Please set up a hospital follow up! Thanks! Patient of Dr. Adela Lank,  Has chronically elevated LFTs with nondiagnostic liver biopsy and negative serological workup.  Patient is difficulty with transportation, was referred to [facility] 2022 but her insurance was not excepted by week she has new insurance now in order to get her to local provider can we try to put in another referral for [facility]?  If that does not work we can try [facility] which I think is also local.  We have emphasized to the patient the importance of following up and she is can work on getting transportation. Can set up hospital follow-up 1 to 3 months in our office with myself or Dr. Adela Lank. Thanks, Marchelle Folks

## 2023-02-15 NOTE — Progress Notes (Signed)
PROGRESS NOTE    Gabriela Campbell  BJY:[id number] DOB: 01/14/1965 DOA: 02/13/2023 PCP: Pcp, No  Subjective: Pt seen and examined.  MRCP negative for any biliary obstructions. GI has ordered liver biopsy due to elevate LFTs and prior hx of hepatic fibrosis from unknown cause.  Oxycodone helps with leg cramps and pain. Confirmed that pt is not taking any meds for her MS.   Hospital Course: HPI: Gabriela Campbell is a 58 y.o. female with hx of relapsing remitting multiple sclerosis, hemochromatosis, abnormal LFT, hypertension, mood disorder, who presented due to worsening weakness and sensory changes in her lower extremities.  Reports that these changes have been ongoing for the past 3 to 4 days.  Describes weakness in her hip flexors and around the knee, paresthesias along the anterior thighs, worsening numbness of both feet.  Also has paresthesias in both hands.  Otherwise no history of headache, speech change, vision changes, head injury.  Denies any dysuria or recent urinary changes.  Did have an episode of vomiting about 2 weeks ago but no other GI symptoms at this time.  No fevers.   Significant Events: Admitted 02/13/2023 for UTI   Significant Labs: WBC 13.9, Hgb 15.7, plt 194 Na 139, K 3.8, CO2 of 25, BUN 16, scr 0.67 UA cloudy, small LE, negative nitrite, WBC clumps  Significant Imaging Studies: MRI brain, c-spine, t-spine, L-spine shows  Unchanged appearance/distribution of chronic demyelinating lesions intracranially and in the spinal cord. No enhancing lesions to suggest active demyelination. . No significant canal or foraminal stenosis. CXR No active cardiopulmonary disease  Antibiotic Therapy: Anti-infectives (From admission, onward)    Start     Dose/Rate Route Frequency Ordered Stop   02/14/23 0230  fosfomycin (MONUROL) packet 3 g        3 g Oral  Once 02/14/23 0220 02/14/23 0232       Procedures:   Consultants:     Assessment and Plan: * Urinary tract  infection 02-15-2023 Treated with po fosfomycin. Likely the cause of her weakness. NOT a MS exacerbation.  Elevated LFTs 02-14-2023 Hx of elevated LFTs. But usually AST/ALT are not this high. Check RUQ U/S. Last liver biopsy in 06-2020 showed "Her last liver biopsy showed concern for chronic hepatitis with grade 2 or 3 fibrosis.". she does have hx of hemochromatosis.  Pt was referred to [facility] but never went to appointment.  Appears pt referred to [facility] but was rejected due to ineligibility.    02-15-2023 thanks for GI consult. MRCP negative for biliary obstruction. GI has ordered liver biopsy today and other labs are pending. Plan for DC tomorrow.  Essential hypertension Stable. Hold HTN meds for SBP <120.  Relapsing remitting multiple sclerosis (HCC) Chronic.  DVT prophylaxis:   SCDs   Code Status: Limited: Do not attempt resuscitation (DNR) -DNR-LIMITED -Do Not Intubate/DNI  Family Communication: no family at bedside. She is decisional. Disposition Plan: return home Reason for continuing need for hospitalization: getting liver biopsy today.  Objective: Vitals:   02/14/23 1336 02/14/23 2001 02/15/23 0537 02/15/23 0923  BP: 117/75 122/80 96/75 101/79  Pulse: 84 92 78 84  Resp: 16 15 16    Temp: 97.9 F (36.6 C) 98.1 F (36.7 C) 97.8 F (36.6 C)   TempSrc: Oral     SpO2: 100% 99% 99%   Weight:      Height:        Intake/Output Summary (Last 24 hours) at 02/15/2023 1308 Last data filed at 02/15/2023 0800 Gross  per 24 hour  Intake 200 ml  Output 1 ml  Net 199 ml   Filed Weights   02/13/23 1734  Weight: 43.1 kg    Examination:  Physical Exam Vitals and nursing note reviewed.  Constitutional:      General: She is not in acute distress.    Appearance: She is not toxic-appearing or diaphoretic.     Comments: Chronically ill appearing  HENT:     Head: Normocephalic and atraumatic.     Nose: Nose normal.  Eyes:     General: No scleral  icterus. Cardiovascular:     Rate and Rhythm: Normal rate and regular rhythm.  Pulmonary:     Effort: Pulmonary effort is normal.     Breath sounds: Normal breath sounds.  Abdominal:     General: Abdomen is flat. Bowel sounds are normal. There is no distension.     Palpations: Abdomen is soft.  Musculoskeletal:     Right lower leg: No edema.     Left lower leg: No edema.  Skin:    General: Skin is warm and dry.     Capillary Refill: Capillary refill takes less than 2 seconds.  Neurological:     Mental Status: She is alert and oriented to person, place, and time.     Data Reviewed: I have personally reviewed following labs and imaging studies  CBC: Recent Labs  Lab 02/13/23 1823 02/14/23 0500 02/15/23 0324  WBC 13.9* 16.7* 8.5  NEUTROABS 10.8*  --  4.9  HGB 15.7* 14.3 14.3  HCT 46.4* 43.7 44.2  MCV 95.3 96.5 98.0  PLT 194 193 165   Basic Metabolic Panel: Recent Labs  Lab 02/13/23 1823 02/14/23 0500 02/15/23 0324  NA 139 136 136  K 3.8 3.9 4.6  CL 102 101 101  CO2 25 26 25   GLUCOSE 103* 106* 100*  BUN 16 17 23*  CREATININE 0.67 0.67 0.83  CALCIUM 9.5 9.2 9.1  MG  --  2.1  --   PHOS  --  4.3  --    GFR: Estimated Creatinine Clearance: 49.7 mL/min (by C-G formula based on SCr of 0.83 mg/dL). Liver Function Tests: Recent Labs  Lab 02/14/23 0500 02/15/23 0324  AST 849* 452*  ALT 373* 576*  ALKPHOS 524* 575*  BILITOT 1.0 0.4  PROT 7.0 7.0  ALBUMIN 3.4* 3.5   Coagulation Profile: Recent Labs  Lab 02/14/23 1303  INR 1.0   Cardiac Enzymes: Recent Labs  Lab 02/13/23 2156  CKTOTAL 61   Anemia Panel: Recent Labs    02/13/23 2156 02/14/23 1303  VITAMINB12 3,118*  --   FERRITIN  --  1,481*  TIBC  --  381  IRON  --  187*    Recent Results (from the past 240 hours)  Urine Culture     Status: None (Preliminary result)   Collection Time: 02/13/23  1:39 AM   Specimen: Urine, Random  Result Value Ref Range Status   Specimen Description   Final     URINE, RANDOM Performed at [facility], [address] [place], [place] [address]    Special Requests   Final    NONE Reflexed from [phone] Performed at [facility], [address] [place], [place] [address]    Culture   Final    CULTURE REINCUBATED FOR BETTER GROWTH Performed at [facility], [address], [place], [place] [address]    Report Status PENDING  Incomplete  Radiology Studies: MR ABDOMEN MRCP W WO CONTAST Result Date: 02/15/2023 CLINICAL DATA:  58 year old female with history of right upper quadrant abdominal pain. Biliary tract dilatation noted on recent ultrasound examination. EXAM: MRI ABDOMEN WITHOUT AND WITH CONTRAST (INCLUDING MRCP) TECHNIQUE: Multiplanar multisequence MR imaging of the abdomen was performed both before and after the administration of intravenous contrast. Heavily T2-weighted images of the biliary and pancreatic ducts were obtained, and three-dimensional MRCP images were rendered by post processing. CONTRAST:  4mL GADAVIST GADOBUTROL 1 MMOL/ML IV SOLN COMPARISON:  Abdominal MRI 05/19/2021. FINDINGS: Comment: Portions of today's examination are limited by substantial patient respiratory motion. Lower chest: Unremarkable. Hepatobiliary: No suspicious cystic or solid hepatic lesions. Status post cholecystectomy. Mild intra and extrahepatic biliary ductal dilatation again noted on MRCP images, with the proximal common bile duct measuring up to 7 mm. No definite filling defect identified in the common bile duct to suggest choledocholithiasis. Pancreas: No pancreatic mass. Main pancreatic duct is upper limits of normal in caliber measuring up to 3 mm in the body of the pancreas. No peripancreatic fluid collections or inflammatory changes. Spleen:  Unremarkable. Adrenals/Urinary Tract: Bilateral kidneys and adrenal glands are normal in appearance. No hydroureteronephrosis in the visualized abdomen. Stomach/Bowel:  Visualized portions are unremarkable. Vascular/Lymphatic: No aneurysm identified in the visualized abdominal vasculature. No lymphadenopathy noted in the abdomen. Other: No significant volume of ascites noted in the visualized portions of the peritoneal cavity. Musculoskeletal: No aggressive appearing osseous lesions are noted in the visualized portions of the skeleton. IMPRESSION: 1. Status post cholecystectomy with mild intra and extrahepatic biliary ductal dilatation, similar to prior studies, presumably reflective of benign post cholecystectomy physiology. No choledocholithiasis or other cause of obstruction identified. Electronically Signed   By: Trudie Reed M.D.   On: 02/15/2023 07:52   MR 3D Recon At Scanner Result Date: 02/15/2023 CLINICAL DATA:  58 year old female with history of right upper quadrant abdominal pain. Biliary tract dilatation noted on recent ultrasound examination. EXAM: MRI ABDOMEN WITHOUT AND WITH CONTRAST (INCLUDING MRCP) TECHNIQUE: Multiplanar multisequence MR imaging of the abdomen was performed both before and after the administration of intravenous contrast. Heavily T2-weighted images of the biliary and pancreatic ducts were obtained, and three-dimensional MRCP images were rendered by post processing. CONTRAST:  4mL GADAVIST GADOBUTROL 1 MMOL/ML IV SOLN COMPARISON:  Abdominal MRI 05/19/2021. FINDINGS: Comment: Portions of today's examination are limited by substantial patient respiratory motion. Lower chest: Unremarkable. Hepatobiliary: No suspicious cystic or solid hepatic lesions. Status post cholecystectomy. Mild intra and extrahepatic biliary ductal dilatation again noted on MRCP images, with the proximal common bile duct measuring up to 7 mm. No definite filling defect identified in the common bile duct to suggest choledocholithiasis. Pancreas: No pancreatic mass. Main pancreatic duct is upper limits of normal in caliber measuring up to 3 mm in the body of the pancreas. No  peripancreatic fluid collections or inflammatory changes. Spleen:  Unremarkable. Adrenals/Urinary Tract: Bilateral kidneys and adrenal glands are normal in appearance. No hydroureteronephrosis in the visualized abdomen. Stomach/Bowel: Visualized portions are unremarkable. Vascular/Lymphatic: No aneurysm identified in the visualized abdominal vasculature. No lymphadenopathy noted in the abdomen. Other: No significant volume of ascites noted in the visualized portions of the peritoneal cavity. Musculoskeletal: No aggressive appearing osseous lesions are noted in the visualized portions of the skeleton. IMPRESSION: 1. Status post cholecystectomy with mild intra and extrahepatic biliary ductal dilatation, similar to prior studies, presumably reflective of benign post cholecystectomy physiology. No choledocholithiasis or other cause of obstruction identified. Electronically Signed  By: Trudie Reed M.D.   On: 02/15/2023 07:52   US Abdomen Limited RUQ (LIVER/GB) Result Date: 02/14/2023 CLINICAL DATA:  221910 Elevated LFTs 221910. EXAM: ULTRASOUND ABDOMEN LIMITED RIGHT UPPER QUADRANT COMPARISON:  None Available. FINDINGS: Gallbladder: Surgically absent. Common bile duct: Diameter: 13 mm. Dilated extrahepatic bile duct which may be due to postcholecystectomy status. There is also mild central intrahepatic bile duct dilation. Distal common bile duct is not seen obscuration by overlying bowel gases. Correlate clinically and with liver function tests to determine the need for further imaging with MRI/MRCP. Liver: No focal lesion identified. Within normal limits in parenchymal echogenicity. Portal vein is patent on color Doppler imaging with normal direction of blood flow towards the liver. Other: None. IMPRESSION: *Surgically absent gallbladder. *Dilated intra and extrahepatic bile ducts, as discussed above. Correlate clinically and with liver function tests to determine the need for further imaging with MRI/MRCP.  Electronically Signed   By: Jules Schick M.D.   On: 02/14/2023 13:06   DG Chest 2 View Result Date: 02/14/2023 CLINICAL DATA:  Elevated white blood cell count. EXAM: CHEST - 2 VIEW COMPARISON:  05/07/2022 FINDINGS: Biapical scarring, stable. Heart is normal size. No acute confluent opacities or effusions. No acute bony abnormality. IMPRESSION: No active cardiopulmonary disease. Electronically Signed   By: Charlett Nose M.D.   On: 02/14/2023 01:23   MR Brain W and Wo Contrast Result Date: 02/14/2023 EXAM: MRI HEAD WITHOUT AND WITH CONTRAST MRI CERVICAL SPINE WITHOUT AND WITH CONTRAST MRI CERVICAL THORACIC WITHOUT AND WITH CONTRAST MRI LUMBAR WITHOUT AND WITH CONTRAST CONTRAST:  4mL GADAVIST GADOBUTROL 1 MMOL/ML IV SOLN TECHNIQUE: Multiplanar, multiecho pulse sequences of the brain and surrounding structures, and cervical, thoracic and lumbar spine were obtained without and with intravenous contrast. COMPARISON:  None Available. FINDINGS: MRI HEAD FINDINGS Motion limited.  Within this limitation: Brain: No acute infarction, hemorrhage, hydrocephalus, extra-axial collection or mass lesion. Similar scattered T2/FLAIR hyperintensity in the white matter, compatible with chronic demyelination. No pathologic enhancement. Vascular: Major arterial flow voids are maintained. Skull and upper cervical spine: Normal marrow signal. Sinuses/Orbits: Clear sinuses.  No acute orbital findings. Other: No mastoid effusions. MRI CERVICAL SPINE FINDINGS Alignment: No substantial sagittal subluxation. Vertebrae: No fracture, evidence of discitis, or bone lesion. Cord: Similar appearance of multifocal T2 hyperintense cord lesions spanning C2-C5 on motion limited assessment. 6 no clearly new cord lesion. No abnormal cord enhancement. Posterior Fossa, vertebral arteries, paraspinal tissues: Negative. Disc levels: Facet and uncovertebral hypertrophy without significant canal or foraminal stenosis. MRI THORACIC SPINE FINDINGS Alignment:  Exaggerated thoracic kyphosis. No substantial sagittal subluxation. Vertebrae: No fracture, evidence of discitis, or suspicious bone lesion. Cord: No substantial change in multifocal T2 hyperintensity in the cord at T5, T6-T7 and T8-T9. no pathologic enhancement. Paraspinal and other soft tissues: Unremarkable. Disc levels: No significant canal or foraminal stenosis. MRI LUMBAR SPINE FINDINGS Alignment: No substantial sagittal subluxation. Vertebrae: No fracture, evidence of discitis, or bone lesion. Cord:  Normal. Paraspinal and other soft tissues: Unremarkable. Disc levels: T12-L1: No significant disc protrusion, foraminal stenosis, or canal stenosis. L1-L2: No significant disc protrusion, foraminal stenosis, or canal stenosis. L2-L3: Mild disc bulging.  No significant stenosis. L3-L4: Mild disc bulging.  No significant stenosis. L4-L5: Mild disc bulging. Ligamentum flavum thickening facet arthropathy. No significant stenosis. L5-S1: Mild disc bulging and endplate spurring. Facet arthropathy. No significant stenosis. IMPRESSION: 1. Unchanged appearance/distribution of chronic demyelinating lesions intracranially and in the spinal cord. No enhancing lesions to suggest active demyelination. 2. No  significant canal or foraminal stenosis. Electronically Signed   By: Feliberto Harts M.D.   On: 02/14/2023 00:09   MR Cervical Spine W and Wo Contrast Result Date: 02/14/2023 EXAM: MRI HEAD WITHOUT AND WITH CONTRAST MRI CERVICAL SPINE WITHOUT AND WITH CONTRAST MRI CERVICAL THORACIC WITHOUT AND WITH CONTRAST MRI LUMBAR WITHOUT AND WITH CONTRAST CONTRAST:  4mL GADAVIST GADOBUTROL 1 MMOL/ML IV SOLN TECHNIQUE: Multiplanar, multiecho pulse sequences of the brain and surrounding structures, and cervical, thoracic and lumbar spine were obtained without and with intravenous contrast. COMPARISON:  None Available. FINDINGS: MRI HEAD FINDINGS Motion limited.  Within this limitation: Brain: No acute infarction, hemorrhage,  hydrocephalus, extra-axial collection or mass lesion. Similar scattered T2/FLAIR hyperintensity in the white matter, compatible with chronic demyelination. No pathologic enhancement. Vascular: Major arterial flow voids are maintained. Skull and upper cervical spine: Normal marrow signal. Sinuses/Orbits: Clear sinuses.  No acute orbital findings. Other: No mastoid effusions. MRI CERVICAL SPINE FINDINGS Alignment: No substantial sagittal subluxation. Vertebrae: No fracture, evidence of discitis, or bone lesion. Cord: Similar appearance of multifocal T2 hyperintense cord lesions spanning C2-C5 on motion limited assessment. 6 no clearly new cord lesion. No abnormal cord enhancement. Posterior Fossa, vertebral arteries, paraspinal tissues: Negative. Disc levels: Facet and uncovertebral hypertrophy without significant canal or foraminal stenosis. MRI THORACIC SPINE FINDINGS Alignment: Exaggerated thoracic kyphosis. No substantial sagittal subluxation. Vertebrae: No fracture, evidence of discitis, or suspicious bone lesion. Cord: No substantial change in multifocal T2 hyperintensity in the cord at T5, T6-T7 and T8-T9. no pathologic enhancement. Paraspinal and other soft tissues: Unremarkable. Disc levels: No significant canal or foraminal stenosis. MRI LUMBAR SPINE FINDINGS Alignment: No substantial sagittal subluxation. Vertebrae: No fracture, evidence of discitis, or bone lesion. Cord:  Normal. Paraspinal and other soft tissues: Unremarkable. Disc levels: T12-L1: No significant disc protrusion, foraminal stenosis, or canal stenosis. L1-L2: No significant disc protrusion, foraminal stenosis, or canal stenosis. L2-L3: Mild disc bulging.  No significant stenosis. L3-L4: Mild disc bulging.  No significant stenosis. L4-L5: Mild disc bulging. Ligamentum flavum thickening facet arthropathy. No significant stenosis. L5-S1: Mild disc bulging and endplate spurring. Facet arthropathy. No significant stenosis. IMPRESSION: 1.  Unchanged appearance/distribution of chronic demyelinating lesions intracranially and in the spinal cord. No enhancing lesions to suggest active demyelination. 2. No significant canal or foraminal stenosis. Electronically Signed   By: Feliberto Harts M.D.   On: 02/14/2023 00:09   MR THORACIC SPINE W WO CONTRAST Result Date: 02/14/2023 EXAM: MRI HEAD WITHOUT AND WITH CONTRAST MRI CERVICAL SPINE WITHOUT AND WITH CONTRAST MRI CERVICAL THORACIC WITHOUT AND WITH CONTRAST MRI LUMBAR WITHOUT AND WITH CONTRAST CONTRAST:  4mL GADAVIST GADOBUTROL 1 MMOL/ML IV SOLN TECHNIQUE: Multiplanar, multiecho pulse sequences of the brain and surrounding structures, and cervical, thoracic and lumbar spine were obtained without and with intravenous contrast. COMPARISON:  None Available. FINDINGS: MRI HEAD FINDINGS Motion limited.  Within this limitation: Brain: No acute infarction, hemorrhage, hydrocephalus, extra-axial collection or mass lesion. Similar scattered T2/FLAIR hyperintensity in the white matter, compatible with chronic demyelination. No pathologic enhancement. Vascular: Major arterial flow voids are maintained. Skull and upper cervical spine: Normal marrow signal. Sinuses/Orbits: Clear sinuses.  No acute orbital findings. Other: No mastoid effusions. MRI CERVICAL SPINE FINDINGS Alignment: No substantial sagittal subluxation. Vertebrae: No fracture, evidence of discitis, or bone lesion. Cord: Similar appearance of multifocal T2 hyperintense cord lesions spanning C2-C5 on motion limited assessment. 6 no clearly new cord lesion. No abnormal cord enhancement. Posterior Fossa, vertebral arteries, paraspinal tissues: Negative. Disc levels: Facet and  uncovertebral hypertrophy without significant canal or foraminal stenosis. MRI THORACIC SPINE FINDINGS Alignment: Exaggerated thoracic kyphosis. No substantial sagittal subluxation. Vertebrae: No fracture, evidence of discitis, or suspicious bone lesion. Cord: No substantial change  in multifocal T2 hyperintensity in the cord at T5, T6-T7 and T8-T9. no pathologic enhancement. Paraspinal and other soft tissues: Unremarkable. Disc levels: No significant canal or foraminal stenosis. MRI LUMBAR SPINE FINDINGS Alignment: No substantial sagittal subluxation. Vertebrae: No fracture, evidence of discitis, or bone lesion. Cord:  Normal. Paraspinal and other soft tissues: Unremarkable. Disc levels: T12-L1: No significant disc protrusion, foraminal stenosis, or canal stenosis. L1-L2: No significant disc protrusion, foraminal stenosis, or canal stenosis. L2-L3: Mild disc bulging.  No significant stenosis. L3-L4: Mild disc bulging.  No significant stenosis. L4-L5: Mild disc bulging. Ligamentum flavum thickening facet arthropathy. No significant stenosis. L5-S1: Mild disc bulging and endplate spurring. Facet arthropathy. No significant stenosis. IMPRESSION: 1. Unchanged appearance/distribution of chronic demyelinating lesions intracranially and in the spinal cord. No enhancing lesions to suggest active demyelination. 2. No significant canal or foraminal stenosis. Electronically Signed   By: Feliberto Harts M.D.   On: 02/14/2023 00:09   MR Lumbar Spine W Wo Contrast Result Date: 02/14/2023 EXAM: MRI HEAD WITHOUT AND WITH CONTRAST MRI CERVICAL SPINE WITHOUT AND WITH CONTRAST MRI CERVICAL THORACIC WITHOUT AND WITH CONTRAST MRI LUMBAR WITHOUT AND WITH CONTRAST CONTRAST:  4mL GADAVIST GADOBUTROL 1 MMOL/ML IV SOLN TECHNIQUE: Multiplanar, multiecho pulse sequences of the brain and surrounding structures, and cervical, thoracic and lumbar spine were obtained without and with intravenous contrast. COMPARISON:  None Available. FINDINGS: MRI HEAD FINDINGS Motion limited.  Within this limitation: Brain: No acute infarction, hemorrhage, hydrocephalus, extra-axial collection or mass lesion. Similar scattered T2/FLAIR hyperintensity in the white matter, compatible with chronic demyelination. No pathologic enhancement.  Vascular: Major arterial flow voids are maintained. Skull and upper cervical spine: Normal marrow signal. Sinuses/Orbits: Clear sinuses.  No acute orbital findings. Other: No mastoid effusions. MRI CERVICAL SPINE FINDINGS Alignment: No substantial sagittal subluxation. Vertebrae: No fracture, evidence of discitis, or bone lesion. Cord: Similar appearance of multifocal T2 hyperintense cord lesions spanning C2-C5 on motion limited assessment. 6 no clearly new cord lesion. No abnormal cord enhancement. Posterior Fossa, vertebral arteries, paraspinal tissues: Negative. Disc levels: Facet and uncovertebral hypertrophy without significant canal or foraminal stenosis. MRI THORACIC SPINE FINDINGS Alignment: Exaggerated thoracic kyphosis. No substantial sagittal subluxation. Vertebrae: No fracture, evidence of discitis, or suspicious bone lesion. Cord: No substantial change in multifocal T2 hyperintensity in the cord at T5, T6-T7 and T8-T9. no pathologic enhancement. Paraspinal and other soft tissues: Unremarkable. Disc levels: No significant canal or foraminal stenosis. MRI LUMBAR SPINE FINDINGS Alignment: No substantial sagittal subluxation. Vertebrae: No fracture, evidence of discitis, or bone lesion. Cord:  Normal. Paraspinal and other soft tissues: Unremarkable. Disc levels: T12-L1: No significant disc protrusion, foraminal stenosis, or canal stenosis. L1-L2: No significant disc protrusion, foraminal stenosis, or canal stenosis. L2-L3: Mild disc bulging.  No significant stenosis. L3-L4: Mild disc bulging.  No significant stenosis. L4-L5: Mild disc bulging. Ligamentum flavum thickening facet arthropathy. No significant stenosis. L5-S1: Mild disc bulging and endplate spurring. Facet arthropathy. No significant stenosis. IMPRESSION: 1. Unchanged appearance/distribution of chronic demyelinating lesions intracranially and in the spinal cord. No enhancing lesions to suggest active demyelination. 2. No significant canal or  foraminal stenosis. Electronically Signed   By: Feliberto Harts M.D.   On: 02/14/2023 00:09    Scheduled Meds:  amLODipine  5 mg Oral Daily   capsaicin  Topical BID   lidocaine  1 patch Transdermal Q24H   thiamine  100 mg Oral Daily   Continuous Infusions:   LOS: 0 days   Time spent: 40 minutes  Carollee Herter, DO  [facility]  02/15/2023, 1:08 PM

## 2023-02-15 NOTE — Evaluation (Signed)
Occupational Therapy Evaluation Patient Details Name: Gabriela Campbell MRN: [id number] DOB: 01-21-1965 Today's Date: 02/15/2023   History of Present Illness   Gabriela Campbell is a 58 y.o. female who presented 02/13/23  due to worsening weakness and sensory changes in her lower extremities. Found to have UTI with presumed pseudorelapse of multiple sclerosis. PMH: relapsing remitting multiple sclerosis, hemochromatosis, abnormal LFT, hypertension, mood disorder     Clinical Impressions Patient is currently requiring assistance with ADLs including up to minimal assist with Lower body ADLs in seated or standing position, and setup assist with Upper body ADLs in seated position, as well as  supervision and increased pt effort with bed mobility and up to Minimal assist with functional transfers to toilet with use of RW.   Current level of function is below patient's typical baseline, although pt endorses frequent falls at home.    During this evaluation, patient was limited by 9/10 BLE pain to thighs and lighter pain to back, generalized weakness, impaired activity tolerance, and significantly diminished sensation, all of which has the potential to impact patient's and/or caregivers' safety and independence during functional mobility, as well as performance for ADLs.    Patient lives with father who  able to provide 24/7 supervision and assistance.  Pt reports he is in good health. Patient demonstrates fair to good rehab potential, and should benefit from continued skilled occupational therapy services while in acute care to maximize safety, independence and quality of life at home.  Continued occupational therapy services at home are recommended as long as pt's father can adequately assist her.  ?      If plan is discharge home, recommend the following:   A little help with bathing/dressing/bathroom;A little help with walking and/or transfers;Assistance with cooking/housework;Help with stairs  or ramp for entrance     Functional Status Assessment   Patient has had a recent decline in their functional status and demonstrates the ability to make significant improvements in function in a reasonable and predictable amount of time.     Equipment Recommendations    Theme park manager for community appointments.)     Recommendations for Other Services         Precautions/Restrictions   Precautions Precautions: Fall Restrictions [employer] Bearing Restrictions Per Provider Order: No     Mobility Bed Mobility Overal bed mobility: Needs Assistance       Supine to sit: Supervision Sit to supine: Supervision, Used rails (Increased time and effort. Pt able to move to partial long sitting and use BUEs to scoot up in bed as well with supervision, increased effort)        Transfers                          Balance Overall balance assessment: History of Falls, Needs assistance Sitting-balance support: No upper extremity supported, Feet supported Sitting balance-Leahy Scale: Fair     Standing balance support: No upper extremity supported, During functional activity, Reliant on assistive device for balance Standing balance-Leahy Scale: Poor Standing balance comment: Some steps appeared ataxic. Unsure if from diminished proprioception, pain and weakness to LEs or other.                           ADL either performed or assessed with clinical judgement   ADL Overall ADL's : Needs assistance/impaired Eating/Feeding: Set up;Sitting   Grooming: Wash/dry hands;Standing;Cueing for safety Grooming Details (indicate cue type and  reason): Cues for safe positioning of RW at sink. CGA for balance. Pt reports hands to not allow her to style her hair due to parathesias and weakness. Upper Body Bathing: Sitting;Supervision/ safety;Set up   Lower Body Bathing: Contact guard assist;Minimal assistance;Sitting/lateral leans;Sit to/from stand   Upper Body  Dressing : Set up;Supervision/safety;Sitting   Lower Body Dressing: Minimal assistance;Sitting/lateral leans;Sit to/from stand   Toilet Transfer: Cueing for safety;Ambulation;Rolling walker (2 wheels);Contact guard assist;Minimal assistance Toilet Transfer Details (indicate cue type and reason): Min Assist to ambulate to BR and noted RW high for pt. Adjusted while pt on commode and pt CGA returning from bathroom to EOB. Toileting- Clothing Manipulation and Hygiene: Contact guard assist;Sitting/lateral lean;Sit to/from stand       Functional mobility during ADLs: Contact guard assist;Minimal assistance;Cueing for safety;Rolling walker (2 wheels)       Vision   Vision Assessment?: No apparent visual deficits     Perception         Praxis         Pertinent Vitals/Pain Pain Assessment Pain Assessment: 0-10 Pain Score: 9  Pain Location: thighs 9/10 pain. And back "a little". Pain Descriptors / Indicators: Aching Pain Intervention(s): Limited activity within patient's tolerance, Monitored during session, Repositioned, Patient requesting pain meds-RN notified (RN to room to explain pain meds unavailable for 2 hours.)     Extremity/Trunk Assessment Upper Extremity Assessment Upper Extremity Assessment: Right hand dominant;RUE deficits/detail;LUE deficits/detail RUE Deficits / Details: WFL ROM and grossly 4-/5 MMT RUE Sensation: decreased proprioception;decreased light touch RUE Coordination: decreased fine motor LUE Deficits / Details: WFL ROM and grossly 4-/5 MMT LUE Sensation: decreased proprioception;decreased light touch LUE Coordination: decreased fine motor   Lower Extremity Assessment Lower Extremity Assessment: RLE deficits/detail;LLE deficits/detail RLE Deficits / Details: plantarflex 3, dorsiflex 3+, hip flex 2, knee ext 3/ RLE Sensation: decreased proprioception;decreased light touch LLE Deficits / Details: similar  as right LLE Sensation: decreased proprioception  (proprioception Absent to great toe)   Cervical / Trunk Assessment Cervical / Trunk Assessment: Kyphotic;Other exceptions   Communication Communication Communication: No apparent difficulties   Cognition Arousal: Alert Behavior During Therapy: WFL for tasks assessed/performed Cognition: No apparent impairments                               Following commands: Intact       Cueing  General Comments          Exercises     Shoulder Instructions      Home Living Family/patient expects to be discharged to:: Private residence Living Arrangements: Parent (with father) Available Help at Discharge: Available 24 hours/day;Family Type of Home: House Home Access: Stairs to enter [employer] of Steps: 4 Entrance Stairs-Rails: None Home Layout: One level     Bathroom Shower/Tub: Producer, television/film/video: Standard Bathroom Accessibility: Yes How Accessible: Accessible via walker Home Equipment: Pharmacist, hospital (2 wheels);Cane - single point          Prior Functioning/Environment Prior Level of Function : Independent/Modified Independent             Mobility Comments: Pt ambulates with RW for in home, limited community distance now. reports multiple falls in last 6 months (legs give out) ADLs Comments: Reports independent basic adls. Otherwise, pt uses easy microwave meals, does own laundry and some cleaning. Father assists with any IADLs that pt needs.    OT Problem List: Decreased strength;Decreased safety  awareness;Decreased activity tolerance;Decreased knowledge of use of DME or AE;Impaired balance (sitting and/or standing);Pain;Decreased knowledge of precautions;Decreased coordination;Impaired sensation   OT Treatment/Interventions: Self-care/ADL training;Therapeutic activities;Therapeutic exercise;Neuromuscular education;Energy conservation;Patient/family education;Balance training;DME and/or AE instruction      OT  Goals(Current goals can be found in the care plan section)   Acute Rehab OT Goals Patient Stated Goal: Go home, "Not to a facility". OT Goal Formulation: With patient Time For Goal Achievement: 03/01/23 Potential to Achieve Goals: Fair ADL Goals Pt Will Perform Grooming: standing;with modified independence Pt Will Perform Lower Body Dressing: with modified independence;with adaptive equipment;sitting/lateral leans;sit to/from stand Pt Will Transfer to Toilet: ambulating;regular height toilet;with supervision Pt Will Perform Toileting - Clothing Manipulation and hygiene: with modified independence;sitting/lateral leans;sit to/from stand Pt/caregiver will Perform Home Exercise Program: Both right and left upper extremity (Increase general activity tolerance. consider movements such as tai chi to assist with joint pain and flexibility.) Additional ADL Goal #1: Patient will identify at least 3 fall prevention strategies to employ at home in order to maximize function and safety during ADLs and decrease caregiver burden while preventing possible injury and rehospitalization.   OT Frequency:  Min 1X/week    Co-evaluation              AM-PAC OT "6 Clicks" Daily Activity     Outcome Measure Help from another person eating meals?: A Little Help from another person taking care of personal grooming?: A Little Help from another person toileting, which includes using toliet, bedpan, or urinal?: A Little Help from another person bathing (including washing, rinsing, drying)?: A Little Help from another person to put on and taking off regular upper body clothing?: A Little Help from another person to put on and taking off regular lower body clothing?: A Little 6 Click Score: 18   End of Session Equipment Utilized During Treatment: Gait belt;Rolling walker (2 wheels) Nurse Communication: Patient requests pain meds  Activity Tolerance: Patient limited by fatigue;Patient limited by pain Patient  left: with bed alarm set;with call bell/phone within reach;in bed  OT Visit Diagnosis: Unsteadiness on feet (R26.81);Pain;Repeated falls (R29.6);History of falling (Z91.81);Ataxia, unspecified (R27.0);Muscle weakness (generalized) (M62.81) Pain - Right/Left:  (Bil) Pain - part of body: Leg                Time: 1610-9604 OT Time Calculation (min): 24 min Charges:  OT General Charges $OT Visit: 1 Visit OT Evaluation $OT Eval Low Complexity: 1 Low OT Treatments $Self Care/Home Management : 8-22 mins  Victorino Dike, OT Acute Rehab Services Office: [phone] 02/15/2023   Theodoro Clock 02/15/2023, 12:34 PM

## 2023-02-15 NOTE — Consult Note (Addendum)
Chief Complaint: Elevated liver function tests/hemochromatosis; referred for random core liver biopsy  Referring Provider(s): Collier,A PA-C  Supervising Physician: Henn,A  Patient Status: [facility] - In-pt  History of Present Illness: Gabriela Campbell is a 58 y.o. female smoker with past medical history significant for MS, shingles, elevated liver enzymes, hypertension, mood disorder, prior cholecystectomy, hemochromatosis who was admitted to [facility] 02/13/23 for suspected UTI, worsening weakness and sensory changes in her lower extremities and hands.  She is known to IR team from random liver biopsy in 2022 with pathology showing nonspecific changes and moderate fibrosis 2-3, no changes of hemochromatosis noted at that time.  MRI abdomen/MRCP revealed mild intra and extrahepatic biliary ductal dilatation, no choledocholithiasis.  Due to persistent elevated LFTs of uncertain etiology request now received from GI team for repeat random core liver biopsy.  Patient is DNR-limited  Past Medical History:  Diagnosis Date   Allergy    Multiple sclerosis (HCC)    Shingles    Weakness     Past Surgical History:  Procedure Laterality Date   ABDOMINAL HYSTERECTOMY     CHOLECYSTECTOMY      Allergies: Dilaudid [hydromorphone hcl], Fish-derived products, Ibuprofen, Iodine, Penicillins, Shellfish-derived products, Shrimp extract, Baclofen, Gabapentin, Codeine, and Tramadol  Medications: Prior to Admission medications   Medication Sig Start Date End Date Taking? Authorizing Provider  Cholecalciferol (VITAMIN D3) 25 MCG (1000 UT) CAPS Take 2,000 Units by mouth daily.   Yes [provider]  cyclobenzaprine (FLEXERIL) 10 MG tablet Take 1 tablet (10 mg total) by mouth 2 (two) times daily as needed for muscle spasms. 10/03/22  Yes Gwyneth Sprout, MD  thiamine (VITAMIN B-1) 100 MG tablet Take 100 mg by mouth daily.   Yes [provider]  amLODipine (NORVASC) 5 MG  tablet Take 0.5 tablets (2.5 mg total) by mouth daily. 02/14/23   Carollee Herter, DO     Family History  Problem Relation Age of Onset   Cancer Paternal Grandmother    Cancer Paternal Aunt    Cancer Paternal Aunt    Colon cancer Neg Hx    Stomach cancer Neg Hx    Esophageal cancer Neg Hx    Pancreatic cancer Neg Hx     Social History   Socioeconomic History   Marital status: Widowed    Spouse name: Not on file   Number of children: 0   Years of education: college   Highest education level: Not on file  Occupational History   Occupation: disable  Tobacco Use   Smoking status: Every Day    Current packs/day: 0.50    Types: Cigarettes   Smokeless tobacco: Never  Vaping Use   Vaping status: Never Used  Substance and Sexual Activity   Alcohol use: No   Drug use: No   Sexual activity: Not on file  Other Topics Concern   Not on file  Social History Narrative   Lives at home with husband.   Caffeine use:  1-2 cups daily.   Right-handed.   Social Drivers of Corporate investment banker Strain: Not on file  Food Insecurity: No Food Insecurity (02/14/2023)   Hunger Vital Sign    Worried About Running Out of Food in the Last Year: Never true    Ran Out of Food in the Last Year: Never true  Transportation Needs: No Transportation Needs (02/14/2023)   PRAPARE - Administrator, Civil Service (Medical): No    Lack of Transportation (Non-Medical): No  Physical Activity: Not on file  Stress: Not on file  Social Connections: Socially Isolated (02/14/2023)   Social Connection and Isolation Panel [NHANES]    Frequency of Communication with Friends and Family: More than three times a week    Frequency of Social Gatherings with Friends and Family: More than three times a week    Attends Religious Services: Never    Database administrator or Organizations: No    Attends Banker Meetings: Never    Marital Status: Widowed       Review of Systems see above:  Currently denies headache, chest pain, dyspnea, cough, abdominal pain, nausea, vomiting or bleeding.  She does have back pain  Vital Signs: BP 101/79   Pulse 84   Temp 97.8 F (36.6 C)   Resp 16   Ht 5\' 2"  (1.575 m)   Wt 95 lb 0.3 oz (43.1 kg)   SpO2 99%   BMI 17.38 kg/m   Advance Care Plan: The advanced care place/surrogate decision maker was discussed at the time of visit and the patient did not wish to discuss or was not able to name a surrogate decision maker or provide an advance care plan.  Physical Exam awake, alert.  Chest clear to auscultation bilaterally.  Heart with regular rate and rhythm.  Abdomen soft, positive bowel sounds, nontender.  No lower extremity edema.  Imaging: MR ABDOMEN MRCP W WO CONTAST Result Date: 02/15/2023 CLINICAL DATA:  58 year old female with history of right upper quadrant abdominal pain. Biliary tract dilatation noted on recent ultrasound examination. EXAM: MRI ABDOMEN WITHOUT AND WITH CONTRAST (INCLUDING MRCP) TECHNIQUE: Multiplanar multisequence MR imaging of the abdomen was performed both before and after the administration of intravenous contrast. Heavily T2-weighted images of the biliary and pancreatic ducts were obtained, and three-dimensional MRCP images were rendered by post processing. CONTRAST:  4mL GADAVIST GADOBUTROL 1 MMOL/ML IV SOLN COMPARISON:  Abdominal MRI 05/19/2021. FINDINGS: Comment: Portions of today's examination are limited by substantial patient respiratory motion. Lower chest: Unremarkable. Hepatobiliary: No suspicious cystic or solid hepatic lesions. Status post cholecystectomy. Mild intra and extrahepatic biliary ductal dilatation again noted on MRCP images, with the proximal common bile duct measuring up to 7 mm. No definite filling defect identified in the common bile duct to suggest choledocholithiasis. Pancreas: No pancreatic mass. Main pancreatic duct is upper limits of normal in caliber measuring up to 3 mm in the body of the  pancreas. No peripancreatic fluid collections or inflammatory changes. Spleen:  Unremarkable. Adrenals/Urinary Tract: Bilateral kidneys and adrenal glands are normal in appearance. No hydroureteronephrosis in the visualized abdomen. Stomach/Bowel: Visualized portions are unremarkable. Vascular/Lymphatic: No aneurysm identified in the visualized abdominal vasculature. No lymphadenopathy noted in the abdomen. Other: No significant volume of ascites noted in the visualized portions of the peritoneal cavity. Musculoskeletal: No aggressive appearing osseous lesions are noted in the visualized portions of the skeleton. IMPRESSION: 1. Status post cholecystectomy with mild intra and extrahepatic biliary ductal dilatation, similar to prior studies, presumably reflective of benign post cholecystectomy physiology. No choledocholithiasis or other cause of obstruction identified. Electronically Signed   By: Trudie Reed M.D.   On: 02/15/2023 07:52   MR 3D Recon At Scanner Result Date: 02/15/2023 CLINICAL DATA:  58 year old female with history of right upper quadrant abdominal pain. Biliary tract dilatation noted on recent ultrasound examination. EXAM: MRI ABDOMEN WITHOUT AND WITH CONTRAST (INCLUDING MRCP) TECHNIQUE: Multiplanar multisequence MR imaging of the abdomen was performed both before and after the administration of  intravenous contrast. Heavily T2-weighted images of the biliary and pancreatic ducts were obtained, and three-dimensional MRCP images were rendered by post processing. CONTRAST:  4mL GADAVIST GADOBUTROL 1 MMOL/ML IV SOLN COMPARISON:  Abdominal MRI 05/19/2021. FINDINGS: Comment: Portions of today's examination are limited by substantial patient respiratory motion. Lower chest: Unremarkable. Hepatobiliary: No suspicious cystic or solid hepatic lesions. Status post cholecystectomy. Mild intra and extrahepatic biliary ductal dilatation again noted on MRCP images, with the proximal common bile duct  measuring up to 7 mm. No definite filling defect identified in the common bile duct to suggest choledocholithiasis. Pancreas: No pancreatic mass. Main pancreatic duct is upper limits of normal in caliber measuring up to 3 mm in the body of the pancreas. No peripancreatic fluid collections or inflammatory changes. Spleen:  Unremarkable. Adrenals/Urinary Tract: Bilateral kidneys and adrenal glands are normal in appearance. No hydroureteronephrosis in the visualized abdomen. Stomach/Bowel: Visualized portions are unremarkable. Vascular/Lymphatic: No aneurysm identified in the visualized abdominal vasculature. No lymphadenopathy noted in the abdomen. Other: No significant volume of ascites noted in the visualized portions of the peritoneal cavity. Musculoskeletal: No aggressive appearing osseous lesions are noted in the visualized portions of the skeleton. IMPRESSION: 1. Status post cholecystectomy with mild intra and extrahepatic biliary ductal dilatation, similar to prior studies, presumably reflective of benign post cholecystectomy physiology. No choledocholithiasis or other cause of obstruction identified. Electronically Signed   By: Trudie Reed M.D.   On: 02/15/2023 07:52   US Abdomen Limited RUQ (LIVER/GB) Result Date: 02/14/2023 CLINICAL DATA:  221910 Elevated LFTs 221910. EXAM: ULTRASOUND ABDOMEN LIMITED RIGHT UPPER QUADRANT COMPARISON:  None Available. FINDINGS: Gallbladder: Surgically absent. Common bile duct: Diameter: 13 mm. Dilated extrahepatic bile duct which may be due to postcholecystectomy status. There is also mild central intrahepatic bile duct dilation. Distal common bile duct is not seen obscuration by overlying bowel gases. Correlate clinically and with liver function tests to determine the need for further imaging with MRI/MRCP. Liver: No focal lesion identified. Within normal limits in parenchymal echogenicity. Portal vein is patent on color Doppler imaging with normal direction of blood  flow towards the liver. Other: None. IMPRESSION: *Surgically absent gallbladder. *Dilated intra and extrahepatic bile ducts, as discussed above. Correlate clinically and with liver function tests to determine the need for further imaging with MRI/MRCP. Electronically Signed   By: Jules Schick M.D.   On: 02/14/2023 13:06   DG Chest 2 View Result Date: 02/14/2023 CLINICAL DATA:  Elevated white blood cell count. EXAM: CHEST - 2 VIEW COMPARISON:  05/07/2022 FINDINGS: Biapical scarring, stable. Heart is normal size. No acute confluent opacities or effusions. No acute bony abnormality. IMPRESSION: No active cardiopulmonary disease. Electronically Signed   By: Charlett Nose M.D.   On: 02/14/2023 01:23   MR Brain W and Wo Contrast Result Date: 02/14/2023 EXAM: MRI HEAD WITHOUT AND WITH CONTRAST MRI CERVICAL SPINE WITHOUT AND WITH CONTRAST MRI CERVICAL THORACIC WITHOUT AND WITH CONTRAST MRI LUMBAR WITHOUT AND WITH CONTRAST CONTRAST:  4mL GADAVIST GADOBUTROL 1 MMOL/ML IV SOLN TECHNIQUE: Multiplanar, multiecho pulse sequences of the brain and surrounding structures, and cervical, thoracic and lumbar spine were obtained without and with intravenous contrast. COMPARISON:  None Available. FINDINGS: MRI HEAD FINDINGS Motion limited.  Within this limitation: Brain: No acute infarction, hemorrhage, hydrocephalus, extra-axial collection or mass lesion. Similar scattered T2/FLAIR hyperintensity in the white matter, compatible with chronic demyelination. No pathologic enhancement. Vascular: Major arterial flow voids are maintained. Skull and upper cervical spine: Normal marrow signal. Sinuses/Orbits: Clear sinuses.  No acute orbital findings. Other: No mastoid effusions. MRI CERVICAL SPINE FINDINGS Alignment: No substantial sagittal subluxation. Vertebrae: No fracture, evidence of discitis, or bone lesion. Cord: Similar appearance of multifocal T2 hyperintense cord lesions spanning C2-C5 on motion limited assessment. 6 no  clearly new cord lesion. No abnormal cord enhancement. Posterior Fossa, vertebral arteries, paraspinal tissues: Negative. Disc levels: Facet and uncovertebral hypertrophy without significant canal or foraminal stenosis. MRI THORACIC SPINE FINDINGS Alignment: Exaggerated thoracic kyphosis. No substantial sagittal subluxation. Vertebrae: No fracture, evidence of discitis, or suspicious bone lesion. Cord: No substantial change in multifocal T2 hyperintensity in the cord at T5, T6-T7 and T8-T9. no pathologic enhancement. Paraspinal and other soft tissues: Unremarkable. Disc levels: No significant canal or foraminal stenosis. MRI LUMBAR SPINE FINDINGS Alignment: No substantial sagittal subluxation. Vertebrae: No fracture, evidence of discitis, or bone lesion. Cord:  Normal. Paraspinal and other soft tissues: Unremarkable. Disc levels: T12-L1: No significant disc protrusion, foraminal stenosis, or canal stenosis. L1-L2: No significant disc protrusion, foraminal stenosis, or canal stenosis. L2-L3: Mild disc bulging.  No significant stenosis. L3-L4: Mild disc bulging.  No significant stenosis. L4-L5: Mild disc bulging. Ligamentum flavum thickening facet arthropathy. No significant stenosis. L5-S1: Mild disc bulging and endplate spurring. Facet arthropathy. No significant stenosis. IMPRESSION: 1. Unchanged appearance/distribution of chronic demyelinating lesions intracranially and in the spinal cord. No enhancing lesions to suggest active demyelination. 2. No significant canal or foraminal stenosis. Electronically Signed   By: Feliberto Harts M.D.   On: 02/14/2023 00:09   MR Cervical Spine W and Wo Contrast Result Date: 02/14/2023 EXAM: MRI HEAD WITHOUT AND WITH CONTRAST MRI CERVICAL SPINE WITHOUT AND WITH CONTRAST MRI CERVICAL THORACIC WITHOUT AND WITH CONTRAST MRI LUMBAR WITHOUT AND WITH CONTRAST CONTRAST:  4mL GADAVIST GADOBUTROL 1 MMOL/ML IV SOLN TECHNIQUE: Multiplanar, multiecho pulse sequences of the brain and  surrounding structures, and cervical, thoracic and lumbar spine were obtained without and with intravenous contrast. COMPARISON:  None Available. FINDINGS: MRI HEAD FINDINGS Motion limited.  Within this limitation: Brain: No acute infarction, hemorrhage, hydrocephalus, extra-axial collection or mass lesion. Similar scattered T2/FLAIR hyperintensity in the white matter, compatible with chronic demyelination. No pathologic enhancement. Vascular: Major arterial flow voids are maintained. Skull and upper cervical spine: Normal marrow signal. Sinuses/Orbits: Clear sinuses.  No acute orbital findings. Other: No mastoid effusions. MRI CERVICAL SPINE FINDINGS Alignment: No substantial sagittal subluxation. Vertebrae: No fracture, evidence of discitis, or bone lesion. Cord: Similar appearance of multifocal T2 hyperintense cord lesions spanning C2-C5 on motion limited assessment. 6 no clearly new cord lesion. No abnormal cord enhancement. Posterior Fossa, vertebral arteries, paraspinal tissues: Negative. Disc levels: Facet and uncovertebral hypertrophy without significant canal or foraminal stenosis. MRI THORACIC SPINE FINDINGS Alignment: Exaggerated thoracic kyphosis. No substantial sagittal subluxation. Vertebrae: No fracture, evidence of discitis, or suspicious bone lesion. Cord: No substantial change in multifocal T2 hyperintensity in the cord at T5, T6-T7 and T8-T9. no pathologic enhancement. Paraspinal and other soft tissues: Unremarkable. Disc levels: No significant canal or foraminal stenosis. MRI LUMBAR SPINE FINDINGS Alignment: No substantial sagittal subluxation. Vertebrae: No fracture, evidence of discitis, or bone lesion. Cord:  Normal. Paraspinal and other soft tissues: Unremarkable. Disc levels: T12-L1: No significant disc protrusion, foraminal stenosis, or canal stenosis. L1-L2: No significant disc protrusion, foraminal stenosis, or canal stenosis. L2-L3: Mild disc bulging.  No significant stenosis. L3-L4:  Mild disc bulging.  No significant stenosis. L4-L5: Mild disc bulging. Ligamentum flavum thickening facet arthropathy. No significant stenosis. L5-S1: Mild disc bulging and endplate spurring. Facet  arthropathy. No significant stenosis. IMPRESSION: 1. Unchanged appearance/distribution of chronic demyelinating lesions intracranially and in the spinal cord. No enhancing lesions to suggest active demyelination. 2. No significant canal or foraminal stenosis. Electronically Signed   By: Feliberto Harts M.D.   On: 02/14/2023 00:09   MR THORACIC SPINE W WO CONTRAST Result Date: 02/14/2023 EXAM: MRI HEAD WITHOUT AND WITH CONTRAST MRI CERVICAL SPINE WITHOUT AND WITH CONTRAST MRI CERVICAL THORACIC WITHOUT AND WITH CONTRAST MRI LUMBAR WITHOUT AND WITH CONTRAST CONTRAST:  4mL GADAVIST GADOBUTROL 1 MMOL/ML IV SOLN TECHNIQUE: Multiplanar, multiecho pulse sequences of the brain and surrounding structures, and cervical, thoracic and lumbar spine were obtained without and with intravenous contrast. COMPARISON:  None Available. FINDINGS: MRI HEAD FINDINGS Motion limited.  Within this limitation: Brain: No acute infarction, hemorrhage, hydrocephalus, extra-axial collection or mass lesion. Similar scattered T2/FLAIR hyperintensity in the white matter, compatible with chronic demyelination. No pathologic enhancement. Vascular: Major arterial flow voids are maintained. Skull and upper cervical spine: Normal marrow signal. Sinuses/Orbits: Clear sinuses.  No acute orbital findings. Other: No mastoid effusions. MRI CERVICAL SPINE FINDINGS Alignment: No substantial sagittal subluxation. Vertebrae: No fracture, evidence of discitis, or bone lesion. Cord: Similar appearance of multifocal T2 hyperintense cord lesions spanning C2-C5 on motion limited assessment. 6 no clearly new cord lesion. No abnormal cord enhancement. Posterior Fossa, vertebral arteries, paraspinal tissues: Negative. Disc levels: Facet and uncovertebral hypertrophy  without significant canal or foraminal stenosis. MRI THORACIC SPINE FINDINGS Alignment: Exaggerated thoracic kyphosis. No substantial sagittal subluxation. Vertebrae: No fracture, evidence of discitis, or suspicious bone lesion. Cord: No substantial change in multifocal T2 hyperintensity in the cord at T5, T6-T7 and T8-T9. no pathologic enhancement. Paraspinal and other soft tissues: Unremarkable. Disc levels: No significant canal or foraminal stenosis. MRI LUMBAR SPINE FINDINGS Alignment: No substantial sagittal subluxation. Vertebrae: No fracture, evidence of discitis, or bone lesion. Cord:  Normal. Paraspinal and other soft tissues: Unremarkable. Disc levels: T12-L1: No significant disc protrusion, foraminal stenosis, or canal stenosis. L1-L2: No significant disc protrusion, foraminal stenosis, or canal stenosis. L2-L3: Mild disc bulging.  No significant stenosis. L3-L4: Mild disc bulging.  No significant stenosis. L4-L5: Mild disc bulging. Ligamentum flavum thickening facet arthropathy. No significant stenosis. L5-S1: Mild disc bulging and endplate spurring. Facet arthropathy. No significant stenosis. IMPRESSION: 1. Unchanged appearance/distribution of chronic demyelinating lesions intracranially and in the spinal cord. No enhancing lesions to suggest active demyelination. 2. No significant canal or foraminal stenosis. Electronically Signed   By: Feliberto Harts M.D.   On: 02/14/2023 00:09   MR Lumbar Spine W Wo Contrast Result Date: 02/14/2023 EXAM: MRI HEAD WITHOUT AND WITH CONTRAST MRI CERVICAL SPINE WITHOUT AND WITH CONTRAST MRI CERVICAL THORACIC WITHOUT AND WITH CONTRAST MRI LUMBAR WITHOUT AND WITH CONTRAST CONTRAST:  4mL GADAVIST GADOBUTROL 1 MMOL/ML IV SOLN TECHNIQUE: Multiplanar, multiecho pulse sequences of the brain and surrounding structures, and cervical, thoracic and lumbar spine were obtained without and with intravenous contrast. COMPARISON:  None Available. FINDINGS: MRI HEAD FINDINGS Motion  limited.  Within this limitation: Brain: No acute infarction, hemorrhage, hydrocephalus, extra-axial collection or mass lesion. Similar scattered T2/FLAIR hyperintensity in the white matter, compatible with chronic demyelination. No pathologic enhancement. Vascular: Major arterial flow voids are maintained. Skull and upper cervical spine: Normal marrow signal. Sinuses/Orbits: Clear sinuses.  No acute orbital findings. Other: No mastoid effusions. MRI CERVICAL SPINE FINDINGS Alignment: No substantial sagittal subluxation. Vertebrae: No fracture, evidence of discitis, or bone lesion. Cord: Similar appearance of multifocal T2 hyperintense cord lesions spanning  C2-C5 on motion limited assessment. 6 no clearly new cord lesion. No abnormal cord enhancement. Posterior Fossa, vertebral arteries, paraspinal tissues: Negative. Disc levels: Facet and uncovertebral hypertrophy without significant canal or foraminal stenosis. MRI THORACIC SPINE FINDINGS Alignment: Exaggerated thoracic kyphosis. No substantial sagittal subluxation. Vertebrae: No fracture, evidence of discitis, or suspicious bone lesion. Cord: No substantial change in multifocal T2 hyperintensity in the cord at T5, T6-T7 and T8-T9. no pathologic enhancement. Paraspinal and other soft tissues: Unremarkable. Disc levels: No significant canal or foraminal stenosis. MRI LUMBAR SPINE FINDINGS Alignment: No substantial sagittal subluxation. Vertebrae: No fracture, evidence of discitis, or bone lesion. Cord:  Normal. Paraspinal and other soft tissues: Unremarkable. Disc levels: T12-L1: No significant disc protrusion, foraminal stenosis, or canal stenosis. L1-L2: No significant disc protrusion, foraminal stenosis, or canal stenosis. L2-L3: Mild disc bulging.  No significant stenosis. L3-L4: Mild disc bulging.  No significant stenosis. L4-L5: Mild disc bulging. Ligamentum flavum thickening facet arthropathy. No significant stenosis. L5-S1: Mild disc bulging and endplate  spurring. Facet arthropathy. No significant stenosis. IMPRESSION: 1. Unchanged appearance/distribution of chronic demyelinating lesions intracranially and in the spinal cord. No enhancing lesions to suggest active demyelination. 2. No significant canal or foraminal stenosis. Electronically Signed   By: Feliberto Harts M.D.   On: 02/14/2023 00:09    Labs:  CBC: Recent Labs    07/30/22 0346 02/13/23 1823 02/14/23 0500 02/15/23 0324  WBC 7.8 13.9* 16.7* 8.5  HGB 13.7 15.7* 14.3 14.3  HCT 40.7 46.4* 43.7 44.2  PLT 139* 194 193 165    COAGS: Recent Labs    02/14/23 1303  INR 1.0    BMP: Recent Labs    07/30/22 0346 02/13/23 1823 02/14/23 0500 02/15/23 0324  NA 137 139 136 136  K 3.9 3.8 3.9 4.6  CL 103 102 101 101  CO2 25 25 26 25   GLUCOSE 107* 103* 106* 100*  BUN 10 16 17  23*  CALCIUM 9.0 9.5 9.2 9.1  CREATININE 0.76 0.67 0.67 0.83  GFRNONAA >60 >60 >60 >60    LIVER FUNCTION TESTS: Recent Labs    07/29/22 0655 07/30/22 0346 02/14/23 0500 02/15/23 0324  BILITOT 0.9 0.4 1.0 0.4  AST 75* 41 849* 452*  ALT 130* 94* 373* 576*  ALKPHOS 558* 462* 524* 575*  PROT 6.3* 6.1* 7.0 7.0  ALBUMIN 3.3* 3.2* 3.4* 3.5    TUMOR MARKERS: No results for input(s): "AFPTM", "CEA", "CA199", "CHROMGRNA" in the last 8760 hours.  Assessment and Plan: 58 y.o. female smoker with past medical history significant for MS, shingles, elevated liver enzymes, hypertension, mood disorder, prior cholecystectomy, hemochromatosis who was admitted to [facility] 02/13/23 for suspected UTI, worsening weakness and sensory changes in her lower extremities and hands.  She is known to IR team from random liver biopsy in 2022 with pathology showing nonspecific changes and moderate fibrosis 2-3, no changes of hemochromatosis noted at that time.  MRI abdomen/MRCP revealed mild intra and extrahepatic biliary ductal dilatation, no choledocholithiasis.  Due to persistent elevated LFTs of uncertain  etiology request now received from GI team for repeat random core liver biopsy.  Imaging studies/case reviewed with Dr. Fredia Sorrow. Risks and benefits of procedure was discussed with the patient  including, but not limited to bleeding, infection, damage to adjacent structures or low yield requiring additional tests.  All of the questions were answered and there is agreement to proceed.  Consent signed and in chart.  Labs reviewed.  Procedure tentatively scheduled for today  Thank you  for allowing our service to participate in Gabriela Campbell 's care.  Electronically Signed: D. Jeananne Rama, PA-C   02/15/2023, 10:03 AM      I spent a total of 20 minutes   in face to face in clinical consultation, greater than 50% of which was counseling/coordinating care for image guided random core liver biopsy

## 2023-02-15 NOTE — Plan of Care (Signed)

## 2023-02-15 NOTE — Progress Notes (Signed)
PT Cancellation Note  Patient Details Name: Gabriela Campbell MRN: [id number] DOB: 1965/02/09   Cancelled Treatment:    Reason Eval/Treat Not Completed: Pain limiting ability to participate. Pt reports she is in 9.5/10 pain B proximal LE. Pt used call bell to request pain medication. PT to return as schedule allows. PT to continue to follow acutely.   Johnny Bridge, PT Acute Rehab   Jacqualyn Posey 02/15/2023, 1:45 PM

## 2023-02-15 NOTE — Progress Notes (Signed)
Progress Note  Primary GI: Dr. Adela Lank DOA: 02/13/2023         Hospital Day: 3   Subjective  Chief Complaint: Elevated LFTs   No family was present at the time of my evaluation. Patient lying in bed only complaint is leg and back pain, denies any abdominal pain, nausea vomiting. MRCP was unremarkable, ALT slightly increased but overall stable.    Objective   Vital signs in last 24 hours: Temp:  [97.8 F (36.6 C)-98.1 F (36.7 C)] 97.8 F (36.6 C) (02/14 0537) Pulse Rate:  [78-96] 78 (02/14 0537) Resp:  [15-18] 16 (02/14 0537) BP: (96-122)/(75-82) 96/75 (02/14 0537) SpO2:  [99 %-100 %] 99 % (02/14 0537) Last BM Date : 02/13/23 Last BM recorded by nurses in past 5 days No data recorded  General:   Pleasant, well developed female in no acute distress Heart:  regular rate and rhythm, no murmurs or gallops Pulm: Clear anteriorly; no wheezing Abdomen:  Soft, Obese AB, Active bowel sounds. mild RUQ tenderness No rebound, neg murphy Extremities:  Without edema. Msk:  Symmetrical without gross deformities. Peripheral pulses intact.  Neurologic:  Alert and  oriented x4;  No focal deficits.  Skin:   Dry and intact without significant lesions or rashes. Psychiatric:  Cooperative. Normal mood and affect.  Intake/Output from previous day: 02/13 0701 - 02/14 0700 In: 200 [P.O.:200] Out: 1 [Urine:1] Intake/Output this shift: No intake/output data recorded.  Studies/Results: MR ABDOMEN MRCP W WO CONTAST Result Date: 02/15/2023 CLINICAL DATA:  58 year old female with history of right upper quadrant abdominal pain. Biliary tract dilatation noted on recent ultrasound examination. EXAM: MRI ABDOMEN WITHOUT AND WITH CONTRAST (INCLUDING MRCP) TECHNIQUE: Multiplanar multisequence MR imaging of the abdomen was performed both before and after the administration of intravenous contrast. Heavily T2-weighted images of the biliary and pancreatic ducts were obtained, and three-dimensional MRCP  images were rendered by post processing. CONTRAST:  4mL GADAVIST GADOBUTROL 1 MMOL/ML IV SOLN COMPARISON:  Abdominal MRI 05/19/2021. FINDINGS: Comment: Portions of today's examination are limited by substantial patient respiratory motion. Lower chest: Unremarkable. Hepatobiliary: No suspicious cystic or solid hepatic lesions. Status post cholecystectomy. Mild intra and extrahepatic biliary ductal dilatation again noted on MRCP images, with the proximal common bile duct measuring up to 7 mm. No definite filling defect identified in the common bile duct to suggest choledocholithiasis. Pancreas: No pancreatic mass. Main pancreatic duct is upper limits of normal in caliber measuring up to 3 mm in the body of the pancreas. No peripancreatic fluid collections or inflammatory changes. Spleen:  Unremarkable. Adrenals/Urinary Tract: Bilateral kidneys and adrenal glands are normal in appearance. No hydroureteronephrosis in the visualized abdomen. Stomach/Bowel: Visualized portions are unremarkable. Vascular/Lymphatic: No aneurysm identified in the visualized abdominal vasculature. No lymphadenopathy noted in the abdomen. Other: No significant volume of ascites noted in the visualized portions of the peritoneal cavity. Musculoskeletal: No aggressive appearing osseous lesions are noted in the visualized portions of the skeleton. IMPRESSION: 1. Status post cholecystectomy with mild intra and extrahepatic biliary ductal dilatation, similar to prior studies, presumably reflective of benign post cholecystectomy physiology. No choledocholithiasis or other cause of obstruction identified. Electronically Signed   By: Trudie Reed M.D.   On: 02/15/2023 07:52   MR 3D Recon At Scanner Result Date: 02/15/2023 CLINICAL DATA:  58 year old female with history of right upper quadrant abdominal pain. Biliary tract dilatation noted on recent ultrasound examination. EXAM: MRI ABDOMEN WITHOUT AND WITH CONTRAST (INCLUDING MRCP) TECHNIQUE:  Multiplanar multisequence MR imaging  of the abdomen was performed both before and after the administration of intravenous contrast. Heavily T2-weighted images of the biliary and pancreatic ducts were obtained, and three-dimensional MRCP images were rendered by post processing. CONTRAST:  4mL GADAVIST GADOBUTROL 1 MMOL/ML IV SOLN COMPARISON:  Abdominal MRI 05/19/2021. FINDINGS: Comment: Portions of today's examination are limited by substantial patient respiratory motion. Lower chest: Unremarkable. Hepatobiliary: No suspicious cystic or solid hepatic lesions. Status post cholecystectomy. Mild intra and extrahepatic biliary ductal dilatation again noted on MRCP images, with the proximal common bile duct measuring up to 7 mm. No definite filling defect identified in the common bile duct to suggest choledocholithiasis. Pancreas: No pancreatic mass. Main pancreatic duct is upper limits of normal in caliber measuring up to 3 mm in the body of the pancreas. No peripancreatic fluid collections or inflammatory changes. Spleen:  Unremarkable. Adrenals/Urinary Tract: Bilateral kidneys and adrenal glands are normal in appearance. No hydroureteronephrosis in the visualized abdomen. Stomach/Bowel: Visualized portions are unremarkable. Vascular/Lymphatic: No aneurysm identified in the visualized abdominal vasculature. No lymphadenopathy noted in the abdomen. Other: No significant volume of ascites noted in the visualized portions of the peritoneal cavity. Musculoskeletal: No aggressive appearing osseous lesions are noted in the visualized portions of the skeleton. IMPRESSION: 1. Status post cholecystectomy with mild intra and extrahepatic biliary ductal dilatation, similar to prior studies, presumably reflective of benign post cholecystectomy physiology. No choledocholithiasis or other cause of obstruction identified. Electronically Signed   By: Trudie Reed M.D.   On: 02/15/2023 07:52   US Abdomen Limited RUQ  (LIVER/GB) Result Date: 02/14/2023 CLINICAL DATA:  221910 Elevated LFTs 221910. EXAM: ULTRASOUND ABDOMEN LIMITED RIGHT UPPER QUADRANT COMPARISON:  None Available. FINDINGS: Gallbladder: Surgically absent. Common bile duct: Diameter: 13 mm. Dilated extrahepatic bile duct which may be due to postcholecystectomy status. There is also mild central intrahepatic bile duct dilation. Distal common bile duct is not seen obscuration by overlying bowel gases. Correlate clinically and with liver function tests to determine the need for further imaging with MRI/MRCP. Liver: No focal lesion identified. Within normal limits in parenchymal echogenicity. Portal vein is patent on color Doppler imaging with normal direction of blood flow towards the liver. Other: None. IMPRESSION: *Surgically absent gallbladder. *Dilated intra and extrahepatic bile ducts, as discussed above. Correlate clinically and with liver function tests to determine the need for further imaging with MRI/MRCP. Electronically Signed   By: Jules Schick M.D.   On: 02/14/2023 13:06   DG Chest 2 View Result Date: 02/14/2023 CLINICAL DATA:  Elevated white blood cell count. EXAM: CHEST - 2 VIEW COMPARISON:  05/07/2022 FINDINGS: Biapical scarring, stable. Heart is normal size. No acute confluent opacities or effusions. No acute bony abnormality. IMPRESSION: No active cardiopulmonary disease. Electronically Signed   By: Charlett Nose M.D.   On: 02/14/2023 01:23   MR Brain W and Wo Contrast Result Date: 02/14/2023 EXAM: MRI HEAD WITHOUT AND WITH CONTRAST MRI CERVICAL SPINE WITHOUT AND WITH CONTRAST MRI CERVICAL THORACIC WITHOUT AND WITH CONTRAST MRI LUMBAR WITHOUT AND WITH CONTRAST CONTRAST:  4mL GADAVIST GADOBUTROL 1 MMOL/ML IV SOLN TECHNIQUE: Multiplanar, multiecho pulse sequences of the brain and surrounding structures, and cervical, thoracic and lumbar spine were obtained without and with intravenous contrast. COMPARISON:  None Available. FINDINGS: MRI HEAD  FINDINGS Motion limited.  Within this limitation: Brain: No acute infarction, hemorrhage, hydrocephalus, extra-axial collection or mass lesion. Similar scattered T2/FLAIR hyperintensity in the white matter, compatible with chronic demyelination. No pathologic enhancement. Vascular: Major arterial flow voids are maintained.  Skull and upper cervical spine: Normal marrow signal. Sinuses/Orbits: Clear sinuses.  No acute orbital findings. Other: No mastoid effusions. MRI CERVICAL SPINE FINDINGS Alignment: No substantial sagittal subluxation. Vertebrae: No fracture, evidence of discitis, or bone lesion. Cord: Similar appearance of multifocal T2 hyperintense cord lesions spanning C2-C5 on motion limited assessment. 6 no clearly new cord lesion. No abnormal cord enhancement. Posterior Fossa, vertebral arteries, paraspinal tissues: Negative. Disc levels: Facet and uncovertebral hypertrophy without significant canal or foraminal stenosis. MRI THORACIC SPINE FINDINGS Alignment: Exaggerated thoracic kyphosis. No substantial sagittal subluxation. Vertebrae: No fracture, evidence of discitis, or suspicious bone lesion. Cord: No substantial change in multifocal T2 hyperintensity in the cord at T5, T6-T7 and T8-T9. no pathologic enhancement. Paraspinal and other soft tissues: Unremarkable. Disc levels: No significant canal or foraminal stenosis. MRI LUMBAR SPINE FINDINGS Alignment: No substantial sagittal subluxation. Vertebrae: No fracture, evidence of discitis, or bone lesion. Cord:  Normal. Paraspinal and other soft tissues: Unremarkable. Disc levels: T12-L1: No significant disc protrusion, foraminal stenosis, or canal stenosis. L1-L2: No significant disc protrusion, foraminal stenosis, or canal stenosis. L2-L3: Mild disc bulging.  No significant stenosis. L3-L4: Mild disc bulging.  No significant stenosis. L4-L5: Mild disc bulging. Ligamentum flavum thickening facet arthropathy. No significant stenosis. L5-S1: Mild disc bulging  and endplate spurring. Facet arthropathy. No significant stenosis. IMPRESSION: 1. Unchanged appearance/distribution of chronic demyelinating lesions intracranially and in the spinal cord. No enhancing lesions to suggest active demyelination. 2. No significant canal or foraminal stenosis. Electronically Signed   By: Feliberto Harts M.D.   On: 02/14/2023 00:09   MR Cervical Spine W and Wo Contrast Result Date: 02/14/2023 EXAM: MRI HEAD WITHOUT AND WITH CONTRAST MRI CERVICAL SPINE WITHOUT AND WITH CONTRAST MRI CERVICAL THORACIC WITHOUT AND WITH CONTRAST MRI LUMBAR WITHOUT AND WITH CONTRAST CONTRAST:  4mL GADAVIST GADOBUTROL 1 MMOL/ML IV SOLN TECHNIQUE: Multiplanar, multiecho pulse sequences of the brain and surrounding structures, and cervical, thoracic and lumbar spine were obtained without and with intravenous contrast. COMPARISON:  None Available. FINDINGS: MRI HEAD FINDINGS Motion limited.  Within this limitation: Brain: No acute infarction, hemorrhage, hydrocephalus, extra-axial collection or mass lesion. Similar scattered T2/FLAIR hyperintensity in the white matter, compatible with chronic demyelination. No pathologic enhancement. Vascular: Major arterial flow voids are maintained. Skull and upper cervical spine: Normal marrow signal. Sinuses/Orbits: Clear sinuses.  No acute orbital findings. Other: No mastoid effusions. MRI CERVICAL SPINE FINDINGS Alignment: No substantial sagittal subluxation. Vertebrae: No fracture, evidence of discitis, or bone lesion. Cord: Similar appearance of multifocal T2 hyperintense cord lesions spanning C2-C5 on motion limited assessment. 6 no clearly new cord lesion. No abnormal cord enhancement. Posterior Fossa, vertebral arteries, paraspinal tissues: Negative. Disc levels: Facet and uncovertebral hypertrophy without significant canal or foraminal stenosis. MRI THORACIC SPINE FINDINGS Alignment: Exaggerated thoracic kyphosis. No substantial sagittal subluxation. Vertebrae: No  fracture, evidence of discitis, or suspicious bone lesion. Cord: No substantial change in multifocal T2 hyperintensity in the cord at T5, T6-T7 and T8-T9. no pathologic enhancement. Paraspinal and other soft tissues: Unremarkable. Disc levels: No significant canal or foraminal stenosis. MRI LUMBAR SPINE FINDINGS Alignment: No substantial sagittal subluxation. Vertebrae: No fracture, evidence of discitis, or bone lesion. Cord:  Normal. Paraspinal and other soft tissues: Unremarkable. Disc levels: T12-L1: No significant disc protrusion, foraminal stenosis, or canal stenosis. L1-L2: No significant disc protrusion, foraminal stenosis, or canal stenosis. L2-L3: Mild disc bulging.  No significant stenosis. L3-L4: Mild disc bulging.  No significant stenosis. L4-L5: Mild disc bulging. Ligamentum flavum thickening facet arthropathy.  No significant stenosis. L5-S1: Mild disc bulging and endplate spurring. Facet arthropathy. No significant stenosis. IMPRESSION: 1. Unchanged appearance/distribution of chronic demyelinating lesions intracranially and in the spinal cord. No enhancing lesions to suggest active demyelination. 2. No significant canal or foraminal stenosis. Electronically Signed   By: Feliberto Harts M.D.   On: 02/14/2023 00:09   MR THORACIC SPINE W WO CONTRAST Result Date: 02/14/2023 EXAM: MRI HEAD WITHOUT AND WITH CONTRAST MRI CERVICAL SPINE WITHOUT AND WITH CONTRAST MRI CERVICAL THORACIC WITHOUT AND WITH CONTRAST MRI LUMBAR WITHOUT AND WITH CONTRAST CONTRAST:  4mL GADAVIST GADOBUTROL 1 MMOL/ML IV SOLN TECHNIQUE: Multiplanar, multiecho pulse sequences of the brain and surrounding structures, and cervical, thoracic and lumbar spine were obtained without and with intravenous contrast. COMPARISON:  None Available. FINDINGS: MRI HEAD FINDINGS Motion limited.  Within this limitation: Brain: No acute infarction, hemorrhage, hydrocephalus, extra-axial collection or mass lesion. Similar scattered T2/FLAIR  hyperintensity in the white matter, compatible with chronic demyelination. No pathologic enhancement. Vascular: Major arterial flow voids are maintained. Skull and upper cervical spine: Normal marrow signal. Sinuses/Orbits: Clear sinuses.  No acute orbital findings. Other: No mastoid effusions. MRI CERVICAL SPINE FINDINGS Alignment: No substantial sagittal subluxation. Vertebrae: No fracture, evidence of discitis, or bone lesion. Cord: Similar appearance of multifocal T2 hyperintense cord lesions spanning C2-C5 on motion limited assessment. 6 no clearly new cord lesion. No abnormal cord enhancement. Posterior Fossa, vertebral arteries, paraspinal tissues: Negative. Disc levels: Facet and uncovertebral hypertrophy without significant canal or foraminal stenosis. MRI THORACIC SPINE FINDINGS Alignment: Exaggerated thoracic kyphosis. No substantial sagittal subluxation. Vertebrae: No fracture, evidence of discitis, or suspicious bone lesion. Cord: No substantial change in multifocal T2 hyperintensity in the cord at T5, T6-T7 and T8-T9. no pathologic enhancement. Paraspinal and other soft tissues: Unremarkable. Disc levels: No significant canal or foraminal stenosis. MRI LUMBAR SPINE FINDINGS Alignment: No substantial sagittal subluxation. Vertebrae: No fracture, evidence of discitis, or bone lesion. Cord:  Normal. Paraspinal and other soft tissues: Unremarkable. Disc levels: T12-L1: No significant disc protrusion, foraminal stenosis, or canal stenosis. L1-L2: No significant disc protrusion, foraminal stenosis, or canal stenosis. L2-L3: Mild disc bulging.  No significant stenosis. L3-L4: Mild disc bulging.  No significant stenosis. L4-L5: Mild disc bulging. Ligamentum flavum thickening facet arthropathy. No significant stenosis. L5-S1: Mild disc bulging and endplate spurring. Facet arthropathy. No significant stenosis. IMPRESSION: 1. Unchanged appearance/distribution of chronic demyelinating lesions intracranially and in  the spinal cord. No enhancing lesions to suggest active demyelination. 2. No significant canal or foraminal stenosis. Electronically Signed   By: Feliberto Harts M.D.   On: 02/14/2023 00:09   MR Lumbar Spine W Wo Contrast Result Date: 02/14/2023 EXAM: MRI HEAD WITHOUT AND WITH CONTRAST MRI CERVICAL SPINE WITHOUT AND WITH CONTRAST MRI CERVICAL THORACIC WITHOUT AND WITH CONTRAST MRI LUMBAR WITHOUT AND WITH CONTRAST CONTRAST:  4mL GADAVIST GADOBUTROL 1 MMOL/ML IV SOLN TECHNIQUE: Multiplanar, multiecho pulse sequences of the brain and surrounding structures, and cervical, thoracic and lumbar spine were obtained without and with intravenous contrast. COMPARISON:  None Available. FINDINGS: MRI HEAD FINDINGS Motion limited.  Within this limitation: Brain: No acute infarction, hemorrhage, hydrocephalus, extra-axial collection or mass lesion. Similar scattered T2/FLAIR hyperintensity in the white matter, compatible with chronic demyelination. No pathologic enhancement. Vascular: Major arterial flow voids are maintained. Skull and upper cervical spine: Normal marrow signal. Sinuses/Orbits: Clear sinuses.  No acute orbital findings. Other: No mastoid effusions. MRI CERVICAL SPINE FINDINGS Alignment: No substantial sagittal subluxation. Vertebrae: No fracture, evidence of discitis, or bone  lesion. Cord: Similar appearance of multifocal T2 hyperintense cord lesions spanning C2-C5 on motion limited assessment. 6 no clearly new cord lesion. No abnormal cord enhancement. Posterior Fossa, vertebral arteries, paraspinal tissues: Negative. Disc levels: Facet and uncovertebral hypertrophy without significant canal or foraminal stenosis. MRI THORACIC SPINE FINDINGS Alignment: Exaggerated thoracic kyphosis. No substantial sagittal subluxation. Vertebrae: No fracture, evidence of discitis, or suspicious bone lesion. Cord: No substantial change in multifocal T2 hyperintensity in the cord at T5, T6-T7 and T8-T9. no pathologic  enhancement. Paraspinal and other soft tissues: Unremarkable. Disc levels: No significant canal or foraminal stenosis. MRI LUMBAR SPINE FINDINGS Alignment: No substantial sagittal subluxation. Vertebrae: No fracture, evidence of discitis, or bone lesion. Cord:  Normal. Paraspinal and other soft tissues: Unremarkable. Disc levels: T12-L1: No significant disc protrusion, foraminal stenosis, or canal stenosis. L1-L2: No significant disc protrusion, foraminal stenosis, or canal stenosis. L2-L3: Mild disc bulging.  No significant stenosis. L3-L4: Mild disc bulging.  No significant stenosis. L4-L5: Mild disc bulging. Ligamentum flavum thickening facet arthropathy. No significant stenosis. L5-S1: Mild disc bulging and endplate spurring. Facet arthropathy. No significant stenosis. IMPRESSION: 1. Unchanged appearance/distribution of chronic demyelinating lesions intracranially and in the spinal cord. No enhancing lesions to suggest active demyelination. 2. No significant canal or foraminal stenosis. Electronically Signed   By: Feliberto Harts M.D.   On: 02/14/2023 00:09    Lab Results: Recent Labs    02/13/23 1823 02/14/23 0500 02/15/23 0324  WBC 13.9* 16.7* 8.5  HGB 15.7* 14.3 14.3  HCT 46.4* 43.7 44.2  PLT 194 193 165   BMET Recent Labs    02/13/23 1823 02/14/23 0500 02/15/23 0324  NA 139 136 136  K 3.8 3.9 4.6  CL 102 101 101  CO2 25 26 25   GLUCOSE 103* 106* 100*  BUN 16 17 23*  CREATININE 0.67 0.67 0.83  CALCIUM 9.5 9.2 9.1   LFT Recent Labs    02/14/23 0500 02/15/23 0324  PROT 7.0 7.0  ALBUMIN 3.4* 3.5  AST 849* 452*  ALT 373* 576*  ALKPHOS 524* 575*  BILITOT 1.0 0.4  BILIDIR 0.5*  --   IBILI 0.5  --    PT/INR Recent Labs    02/14/23 1303  LABPROT 13.3  INR 1.0     Scheduled Meds:  amLODipine  5 mg Oral Daily   capsaicin   Topical BID   lidocaine  1 patch Transdermal Q24H   thiamine  100 mg Oral Daily   Continuous Infusions:    Patient profile:    58 year old female known to me with history of MS, elevated liver enzymes, admitted to the hospital for suspected UTI. She had her liver enzymes checked as part of routine labs and her AST was 849, ALT 373, alk phos 524 with a normal bilirubin. She last had this checked 6 months ago when hospitalized for other issues and she had alk phos elevation with transaminitis but not to this extent. Back in July her transaminases were in the low 100s to 90s.  Previous workup 2022 showed heterozygote hemochromatosis, liver biopsy nonspecific changes moderate fibrosis 2-3, no changes of hemochromatosis noted at that time.    Impression/Plan:   Elevated LFTs with previous fibrosis 3 and 4 on liver biopsy 2022, unknown etiology of elevated LFTs chronically Recent Labs  Lab 02/14/23 0500 02/15/23 0324  AST 849* 452*  ALT 373* 576*  ALKPHOS 524* 575*  BILITOT 1.0 0.4  PROT 7.0 7.0  ALBUMIN 3.4* 3.5   AST 452 ALT 576  Alkphos 575 TBili 0.4 02/14/2023 INR 1.0 R factor 2.1 with mixed injury pattern Ultrasound CBD 13 mm compared to 2 years ago at 8 mm, some intra hepatic ductal dilation. MRCP with unchanged CBD dilation presumed from previous cholecystectomy, normal pancreas but mild pancreatic ductal dilation. Patient with longstanding history of elevated LFTs previous workup showed that hemochromatosis however liver biopsy 2022 did not meet pattern for this, did show fibrosis 3 and 4. no alcohol use, no recent antibiotic use no supplements, no significant medications indicated as culprits for DILI. Normal INR, no hepatic encephalopathy.   Normal platelets no evidence of portal hypertension. -CPK negative, IgG negative, hepatitis C and hepatitis B negative hepatitis A shows immunity -pending ANA, AMA, ASMA, kidney/liver antibody, celiac -MRCP unremarkable other than slight pancreatic duct dilation, will discuss with Dr. Adela Lank need to consider EUS. -With unremarkable MRCP, discussed with patient we  will plan on repeat liver biopsy during hospitalization if possible as patient has socioeconomic hardship and difficulty with transportation.  -Patient has new insurance, messaged my nurse to try to place referral outpatient to [facility] or [facility] hepatology locally. -Emphasized again with patient that imperative for her to follow-up with hepatology to prevent worsening fibrosis/cirrhosis in the future.   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  Related to elevated LFTs  Principal Problem:   Urinary tract infection Active Problems:   Relapsing remitting multiple sclerosis (HCC)   Essential hypertension   Elevated LFTs   Abnormal findings on diagnostic imaging of liver and biliary tract    LOS: 0 days   Doree Albee  02/15/2023, 8:53 AM

## 2023-02-15 NOTE — Progress Notes (Signed)
Pt arrived to the room around 1624. Pt noted to be restless and grimacing on pain. Stated pain 10/10 to right upper abdomen. Pt encouraged to do deep breathing exercises and rest. Biopsy site clean, dry and intact. Vital signs taken every 15 mins* 4. Pt is now more relaxed. Stated pain 6/10. Pt continues to be on bedrest until 1815. Will be checking Vital signs every 30 min*2 then routine. Call bell in reach. Bed low and locked. Will continue to monitor.

## 2023-02-15 NOTE — Plan of Care (Signed)

## 2023-02-15 NOTE — Sedation Documentation (Signed)
PA Allred and Dr Lowella Dandy at bedside to assess patient. Patient writhing in bed, c/o severe abdominal pain. Verbal order for 1 mg Versed IVP x1 obtained from Dr Lowella Dandy and given to patient.

## 2023-02-15 NOTE — Telephone Encounter (Signed)
Referral and records faxed to [facility] hepatology.

## 2023-02-15 NOTE — Procedures (Signed)
Interventional Radiology Procedure:   Indications: Elevated liver enzymes  Procedure: US guided random liver biopsy  Findings: 3 core biopsies from right hepatic lobe.  Gelfoam slurry injected along biopsy tract.  Patient experienced significant post biopsy pain but no evidence for bleeding after the procedure based on ultrasound.   Complications: None     EBL: Minimal  Plan: Bedrest 3 hours.  Pain management.  Viha Kriegel R. Lowella Dandy, MD  Pager: [phone]

## 2023-02-16 DIAGNOSIS — N3000 Acute cystitis without hematuria: Secondary | ICD-10-CM | POA: Diagnosis not present

## 2023-02-16 DIAGNOSIS — R7989 Other specified abnormal findings of blood chemistry: Secondary | ICD-10-CM | POA: Diagnosis not present

## 2023-02-16 DIAGNOSIS — G35 Multiple sclerosis: Secondary | ICD-10-CM | POA: Diagnosis not present

## 2023-02-16 DIAGNOSIS — I10 Essential (primary) hypertension: Secondary | ICD-10-CM | POA: Diagnosis not present

## 2023-02-16 LAB — CBC WITH DIFFERENTIAL/PLATELET
Abs Immature Granulocytes: 0.05 10*3/uL (ref 0.00–0.07)
Basophils Absolute: 0.1 10*3/uL (ref 0.0–0.1)
Basophils Relative: 0 %
Eosinophils Absolute: 0.1 10*3/uL (ref 0.0–0.5)
Eosinophils Relative: 1 %
HCT: 44.8 % (ref 36.0–46.0)
Hemoglobin: 14.4 g/dL (ref 12.0–15.0)
Immature Granulocytes: 0 %
Lymphocytes Relative: 21 %
Lymphs Abs: 2.6 10*3/uL (ref 0.7–4.0)
MCH: 31.4 pg (ref 26.0–34.0)
MCHC: 32.1 g/dL (ref 30.0–36.0)
MCV: 97.6 fL (ref 80.0–100.0)
Monocytes Absolute: 1 10*3/uL (ref 0.1–1.0)
Monocytes Relative: 8 %
Neutro Abs: 8.5 10*3/uL — ABNORMAL HIGH (ref 1.7–7.7)
Neutrophils Relative %: 70 %
Platelets: 186 10*3/uL (ref 150–400)
RBC: 4.59 MIL/uL (ref 3.87–5.11)
RDW: 13.7 % (ref 11.5–15.5)
WBC: 12.3 10*3/uL — ABNORMAL HIGH (ref 4.0–10.5)
nRBC: 0 % (ref 0.0–0.2)

## 2023-02-16 LAB — COMPREHENSIVE METABOLIC PANEL
ALT: 464 U/L — ABNORMAL HIGH (ref 0–44)
AST: 363 U/L — ABNORMAL HIGH (ref 15–41)
Albumin: 3.4 g/dL — ABNORMAL LOW (ref 3.5–5.0)
Alkaline Phosphatase: 591 U/L — ABNORMAL HIGH (ref 38–126)
Anion gap: 10 (ref 5–15)
BUN: 24 mg/dL — ABNORMAL HIGH (ref 6–20)
CO2: 25 mmol/L (ref 22–32)
Calcium: 9.2 mg/dL (ref 8.9–10.3)
Chloride: 101 mmol/L (ref 98–111)
Creatinine, Ser: 0.73 mg/dL (ref 0.44–1.00)
GFR, Estimated: >60 mL/min (ref >60)
Glucose, Bld: 87 mg/dL (ref 70–99)
Potassium: 4.4 mmol/L (ref 3.5–5.1)
Sodium: 136 mmol/L (ref 135–145)
Total Bilirubin: 0.7 mg/dL (ref 0.0–1.2)
Total Protein: 7 g/dL (ref 6.5–8.1)

## 2023-02-16 LAB — IGA: IgA: 434 mg/dL — ABNORMAL HIGH (ref 87–352)

## 2023-02-16 MED ORDER — OXYCODONE HCL 10 MG PO TABS: 5.0000 mg | ORAL_TABLET | ORAL | 0 refills | Status: DC | PRN

## 2023-02-16 NOTE — Progress Notes (Signed)
[facility] Subjective: Patient seems to be doing well after the liver biopsy and claims the pain she was having the right upper quadrant has improved significantly but she still tender there.  She denies having any nausea or vomiting.  AST today is 363 with ALT of 464 alk phos of 591 with albumin of 3.4 total bili of 0.7.  Hemoglobin is 14.4 g/dL white count slightly elevated since yesterday at 12.3 K.  Objective: Vital signs in last 24 hours: Temp:  [97.5 F (36.4 C)-98.4 F (36.9 C)] 98.4 F (36.9 C) (02/15 0544) Pulse Rate:  [78-103] 78 (02/15 0544) Resp:  [11-23] 17 (02/15 0544) BP: (101-164)/(78-128) 113/78 (02/15 0544) SpO2:  [92 %-100 %] 97 % (02/15 0544) Last BM Date : 02/13/23  Intake/Output from previous day: 02/14 0701 - 02/15 0700 In: 480 [P.O.:480] Out: -  Intake/Output this shift: No intake/output data recorded.  General appearance: alert, cooperative, appears older than stated age, and no distress Resp: clear to auscultation bilaterally Cardio: regular rate and rhythm, S1, S2 normal, no murmur, click, rub or gallop GI: soft, tender on palpation in the right upper quadrant with guarding but without rebound or rigidity but with minimal guarding; bowel sounds normal; no masses,  no organomegaly  Lab Results: Recent Labs    02/14/23 0500 02/15/23 0324 02/16/23 0329  WBC 16.7* 8.5 12.3*  HGB 14.3 14.3 14.4  HCT 43.7 44.2 44.8  PLT 193 165 186   BMET Recent Labs    02/14/23 0500 02/15/23 0324 02/16/23 0329  NA 136 136 136  K 3.9 4.6 4.4  CL 101 101 101  CO2 26 25 25   GLUCOSE 106* 100* 87  BUN 17 23* 24*  CREATININE 0.67 0.83 0.73  CALCIUM 9.2 9.1 9.2   LFT Recent Labs    02/14/23 0500 02/15/23 0324 02/16/23 0329  PROT 7.0   < > 7.0  ALBUMIN 3.4*   < > 3.4*  AST 849*   < > 363*  ALT 373*   < > 464*  ALKPHOS 524*   < > 591*  BILITOT 1.0   < > 0.7  BILIDIR 0.5*  --   --   IBILI 0.5  --   --    < > = values in this interval not  displayed.   PT/INR Recent Labs    02/14/23 1303  LABPROT 13.3  INR 1.0   Hepatitis Panel Recent Labs    02/14/23 1538  HEPBSAG NON REACTIVE  HCVAB NON REACTIVE   Studies/Results: US BIOPSY (LIVER) Result Date: 02/15/2023 INDICATION: 58 year old with elevated liver enzymes. Request for random liver biopsy. EXAM: ULTRASOUND-GUIDED LIVER BIOPSY MEDICATIONS: Moderate sedation ANESTHESIA/SEDATION: Moderate (conscious) sedation was employed during this procedure. A total of Versed 4 mg and Fentanyl 150 mcg was administered intravenously by the radiology nurse. Total intra-service moderate Sedation Time: 15 minutes. The patient's level of consciousness and vital signs were monitored continuously by radiology nursing throughout the procedure under my direct supervision. FLUOROSCOPY TIME:  None COMPLICATIONS: None immediate. PROCEDURE: Informed written consent was obtained from the patient after a thorough discussion of the procedural risks, benefits and alternatives. All questions were addressed. Maximal Sterile Barrier Technique was utilized including caps, mask, sterile gowns, sterile gloves, sterile drape, hand hygiene and skin antiseptic. A timeout was performed prior to the initiation of the procedure. Abdomen was evaluated with ultrasound. The right hepatic lobe was identified. The right side of the abdomen was prepped with chlorhexidine and sterile field was created. Skin  was anesthetized using 1% lidocaine. Small incision was made. Using ultrasound guidance, 17 gauge coaxial needle was directed into the right hepatic lobe. Total of 3 core biopsies were performed with an 18 gauge core device. Three adequate specimens obtained and placed in formalin. Gel-Foam slurry was injected as the 17 gauge needle was removed. Bandage placed over the puncture site. FINDINGS: Core biopsies obtained from the right hepatic lobe. Patient had significant discomfort following the procedure. Follow-up ultrasound did  not show any evidence of bleeding or fluid around the liver. IMPRESSION: Successful ultrasound-guided core biopsies from the right hepatic lobe. Electronically Signed   By: Richarda Overlie M.D.   On: 02/15/2023 18:46   MR ABDOMEN MRCP W WO CONTAST Result Date: 02/15/2023 CLINICAL DATA:  58 year old female with history of right upper quadrant abdominal pain. Biliary tract dilatation noted on recent ultrasound examination. EXAM: MRI ABDOMEN WITHOUT AND WITH CONTRAST (INCLUDING MRCP) TECHNIQUE: Multiplanar multisequence MR imaging of the abdomen was performed both before and after the administration of intravenous contrast. Heavily T2-weighted images of the biliary and pancreatic ducts were obtained, and three-dimensional MRCP images were rendered by post processing. CONTRAST:  4mL GADAVIST GADOBUTROL 1 MMOL/ML IV SOLN COMPARISON:  Abdominal MRI 05/19/2021. FINDINGS: Comment: Portions of today's examination are limited by substantial patient respiratory motion. Lower chest: Unremarkable. Hepatobiliary: No suspicious cystic or solid hepatic lesions. Status post cholecystectomy. Mild intra and extrahepatic biliary ductal dilatation again noted on MRCP images, with the proximal common bile duct measuring up to 7 mm. No definite filling defect identified in the common bile duct to suggest choledocholithiasis. Pancreas: No pancreatic mass. Main pancreatic duct is upper limits of normal in caliber measuring up to 3 mm in the body of the pancreas. No peripancreatic fluid collections or inflammatory changes. Spleen:  Unremarkable. Adrenals/Urinary Tract: Bilateral kidneys and adrenal glands are normal in appearance. No hydroureteronephrosis in the visualized abdomen. Stomach/Bowel: Visualized portions are unremarkable. Vascular/Lymphatic: No aneurysm identified in the visualized abdominal vasculature. No lymphadenopathy noted in the abdomen. Other: No significant volume of ascites noted in the visualized portions of the  peritoneal cavity. Musculoskeletal: No aggressive appearing osseous lesions are noted in the visualized portions of the skeleton. IMPRESSION: 1. Status post cholecystectomy with mild intra and extrahepatic biliary ductal dilatation, similar to prior studies, presumably reflective of benign post cholecystectomy physiology. No choledocholithiasis or other cause of obstruction identified. Electronically Signed   By: Trudie Reed M.D.   On: 02/15/2023 07:52   MR 3D Recon At Scanner Result Date: 02/15/2023 CLINICAL DATA:  58 year old female with history of right upper quadrant abdominal pain. Biliary tract dilatation noted on recent ultrasound examination. EXAM: MRI ABDOMEN WITHOUT AND WITH CONTRAST (INCLUDING MRCP) TECHNIQUE: Multiplanar multisequence MR imaging of the abdomen was performed both before and after the administration of intravenous contrast. Heavily T2-weighted images of the biliary and pancreatic ducts were obtained, and three-dimensional MRCP images were rendered by post processing. CONTRAST:  4mL GADAVIST GADOBUTROL 1 MMOL/ML IV SOLN COMPARISON:  Abdominal MRI 05/19/2021. FINDINGS: Comment: Portions of today's examination are limited by substantial patient respiratory motion. Lower chest: Unremarkable. Hepatobiliary: No suspicious cystic or solid hepatic lesions. Status post cholecystectomy. Mild intra and extrahepatic biliary ductal dilatation again noted on MRCP images, with the proximal common bile duct measuring up to 7 mm. No definite filling defect identified in the common bile duct to suggest choledocholithiasis. Pancreas: No pancreatic mass. Main pancreatic duct is upper limits of normal in caliber measuring up to 3 mm in the body  of the pancreas. No peripancreatic fluid collections or inflammatory changes. Spleen:  Unremarkable. Adrenals/Urinary Tract: Bilateral kidneys and adrenal glands are normal in appearance. No hydroureteronephrosis in the visualized abdomen. Stomach/Bowel:  Visualized portions are unremarkable. Vascular/Lymphatic: No aneurysm identified in the visualized abdominal vasculature. No lymphadenopathy noted in the abdomen. Other: No significant volume of ascites noted in the visualized portions of the peritoneal cavity. Musculoskeletal: No aggressive appearing osseous lesions are noted in the visualized portions of the skeleton. IMPRESSION: 1. Status post cholecystectomy with mild intra and extrahepatic biliary ductal dilatation, similar to prior studies, presumably reflective of benign post cholecystectomy physiology. No choledocholithiasis or other cause of obstruction identified. Electronically Signed   By: Trudie Reed M.D.   On: 02/15/2023 07:52   US Abdomen Limited RUQ (LIVER/GB) Result Date: 02/14/2023 CLINICAL DATA:  [id number] Elevated LFTs 221910. EXAM: ULTRASOUND ABDOMEN LIMITED RIGHT UPPER QUADRANT COMPARISON:  None Available. FINDINGS: Gallbladder: Surgically absent. Common bile duct: Diameter: 13 mm. Dilated extrahepatic bile duct which may be due to postcholecystectomy status. There is also mild central intrahepatic bile duct dilation. Distal common bile duct is not seen obscuration by overlying bowel gases. Correlate clinically and with liver function tests to determine the need for further imaging with MRI/MRCP. Liver: No focal lesion identified. Within normal limits in parenchymal echogenicity. Portal vein is patent on color Doppler imaging with normal direction of blood flow towards the liver. Other: None. IMPRESSION: *Surgically absent gallbladder. *Dilated intra and extrahepatic bile ducts, as discussed above. Correlate clinically and with liver function tests to determine the need for further imaging with MRI/MRCP. Electronically Signed   By: Jules Schick M.D.   On: 02/14/2023 13:06    Medications: I have reviewed the patient's current medications. Prior to Admission:  Medications Prior to Admission  Medication Sig Dispense Refill Last  Dose/Taking   Cholecalciferol (VITAMIN D3) 25 MCG (1000 UT) CAPS Take 2,000 Units by mouth daily.   02/13/2023   cyclobenzaprine (FLEXERIL) 10 MG tablet Take 1 tablet (10 mg total) by mouth 2 (two) times daily as needed for muscle spasms. 20 tablet 0 Taking As Needed   thiamine (VITAMIN B-1) 100 MG tablet Take 100 mg by mouth daily.   02/13/2023   Scheduled:  amLODipine  5 mg Oral Daily   capsaicin   Topical BID   lidocaine  1 patch Transdermal Q24H   thiamine  100 mg Oral Daily   Continuous: NWG:[id number], melatonin, methocarbamol, oxyCODONE **OR** oxyCODONE, polyethylene glycol  Assessment/Plan: 1) Elevated liver enzymes/history of hemochromatosis with compound heterozygote HFE gene-ferritin 1481 on 02/14/2023; may need repeat phlebotomy. I think the patient can be discharged from the hospital today with plans for close follow-up with Dr. Adela Lank.  2) Relapsing remitting multiple sclerosis 3) Essential hypertension  LOS: 0 days   Charna Elizabeth 02/16/2023, 8:19 AM

## 2023-02-16 NOTE — Progress Notes (Signed)
PROGRESS NOTE    Gabriela Campbell  ZOX:[id number] DOB: October 27, 1965 DOA: 02/13/2023 PCP: Pcp, No  Subjective: Pt seen and examined.  MRCP negative for any biliary obstructions. GI has ordered liver biopsy due to elevate LFTs and prior hx of hepatic fibrosis from unknown cause.  Oxycodone helps with leg cramps and pain. Confirmed that pt is not taking any meds for her MS.   Hospital Course: HPI: Gabriela Campbell is a 58 y.o. female with hx of relapsing remitting multiple sclerosis, hemochromatosis, abnormal LFT, hypertension, mood disorder, who presented due to worsening weakness and sensory changes in her lower extremities.  Reports that these changes have been ongoing for the past 3 to 4 days.  Describes weakness in her hip flexors and around the knee, paresthesias along the anterior thighs, worsening numbness of both feet.  Also has paresthesias in both hands.  Otherwise no history of headache, speech change, vision changes, head injury.  Denies any dysuria or recent urinary changes.  Did have an episode of vomiting about 2 weeks ago but no other GI symptoms at this time.  No fevers.   Significant Events: Admitted 02/13/2023 for UTI   Significant Labs: WBC 13.9, Hgb 15.7, plt 194 Na 139, K 3.8, CO2 of 25, BUN 16, scr 0.67 UA cloudy, small LE, negative nitrite, WBC clumps  Significant Imaging Studies: MRI brain, c-spine, t-spine, L-spine shows  Unchanged appearance/distribution of chronic demyelinating lesions intracranially and in the spinal cord. No enhancing lesions to suggest active demyelination. . No significant canal or foraminal stenosis. CXR No active cardiopulmonary disease  MRCP negative for bilary obstruction Antibiotic Therapy: Anti-infectives (From admission, onward)    Start     Dose/Rate Route Frequency Ordered Stop   02/14/23 0230  fosfomycin (MONUROL) packet 3 g        3 g Oral  Once 02/14/23 0220 02/14/23 0232       Procedures: 02-15-2023 liver  biopsy  Consultants: GI    Assessment and Plan: * Urinary tract infection 02-15-2023 Treated with po fosfomycin. Likely the cause of her weakness. NOT a MS exacerbation.  02-16-2023 no growth on urine cx.  Elevated LFTs 02-14-2023 Hx of elevated LFTs. But usually AST/ALT are not this high. Check RUQ U/S. Last liver biopsy in 06-2020 showed "Her last liver biopsy showed concern for chronic hepatitis with grade 2 or 3 fibrosis.". she does have hx of hemochromatosis.  Pt was referred to [facility] ([place]) but never went to appointment.  Appears pt referred to [facility] transplant center but was rejected due to ineligibility.    02-15-2023 thanks for GI consult. MRCP negative for biliary obstruction. GI has ordered liver biopsy today and other labs are pending. Plan for DC tomorrow.  02-16-2023 LFTs have improved but not normal. Stable for DC. F/U with GI regarding liver biopsy results and rest of labs ordered.  Essential hypertension Stable. Hold HTN meds for SBP <120.  02-16-2023 reduce dose of norvasc to 2.5 mg daily.  Relapsing remitting multiple sclerosis (HCC) Chronic. 02-16-2023 have provide small supply of oxycodone for her LE pain. F/u with PCP or pain clinic for continued Rx.  DVT prophylaxis:     Code Status: Limited: Do not attempt resuscitation (DNR) -DNR-LIMITED -Do Not Intubate/DNI  Family Communication: no family at bedside Disposition Plan: return home Reason for continuing need for hospitalization: stable for DC  Objective: Vitals:   02/15/23 1745 02/15/23 1815 02/15/23 2128 02/16/23 0544  BP: 138/86 131/87 107/84 113/78  Pulse: 92  87 78  Resp: 16  18 17   Temp: 98.1 F (36.7 C) 98.1 F (36.7 C) 97.8 F (36.6 C) 98.4 F (36.9 C)  TempSrc: Oral Oral Oral   SpO2: 99% 99% 99% 97%  Weight:      Height:        Intake/Output Summary (Last 24 hours) at 02/16/2023 1103 Last data filed at 02/16/2023 0600 Gross per 24 hour  Intake 480 ml  Output --  Net 480 ml   Filed Weights    02/13/23 1734  Weight: 43.1 kg    Examination:  Physical Exam Vitals and nursing note reviewed.  Constitutional:      General: She is not in acute distress.    Appearance: She is not toxic-appearing or diaphoretic.  HENT:     Head: Normocephalic and atraumatic.     Nose: Nose normal.  Eyes:     General: No scleral icterus. Cardiovascular:     Rate and Rhythm: Normal rate and regular rhythm.  Pulmonary:     Effort: Pulmonary effort is normal.     Breath sounds: Normal breath sounds.  Abdominal:     General: Abdomen is flat. Bowel sounds are normal.     Palpations: Abdomen is soft.  Musculoskeletal:     Right lower leg: No edema.     Left lower leg: No edema.  Skin:    General: Skin is warm and dry.     Capillary Refill: Capillary refill takes less than 2 seconds.  Neurological:     Mental Status: She is alert and oriented to person, place, and time.     Data Reviewed: I have personally reviewed following labs and imaging studies  CBC: Recent Labs  Lab 02/13/23 1823 02/14/23 0500 02/15/23 0324 02/16/23 0329  WBC 13.9* 16.7* 8.5 12.3*  NEUTROABS 10.8*  --  4.9 8.5*  HGB 15.7* 14.3 14.3 14.4  HCT 46.4* 43.7 44.2 44.8  MCV 95.3 96.5 98.0 97.6  PLT 194 193 165 186   Basic Metabolic Panel: Recent Labs  Lab 02/13/23 1823 02/14/23 0500 02/15/23 0324 02/16/23 0329  NA 139 136 136 136  K 3.8 3.9 4.6 4.4  CL 102 101 101 101  CO2 25 26 25 25   GLUCOSE 103* 106* 100* 87  BUN 16 17 23* 24*  CREATININE 0.67 0.67 0.83 0.73  CALCIUM 9.5 9.2 9.1 9.2  MG  --  2.1  --   --   PHOS  --  4.3  --   --    GFR: Estimated Creatinine Clearance: 51.5 mL/min (by C-G formula based on SCr of 0.73 mg/dL). Liver Function Tests: Recent Labs  Lab 02/14/23 0500 02/15/23 0324 02/16/23 0329  AST 849* 452* 363*  ALT 373* 576* 464*  ALKPHOS 524* 575* 591*  BILITOT 1.0 0.4 0.7  PROT 7.0 7.0 7.0  ALBUMIN 3.4* 3.5 3.4*   Coagulation Profile: Recent Labs  Lab 02/14/23 1303   INR 1.0   Cardiac Enzymes: Recent Labs  Lab 02/13/23 2156  CKTOTAL 61   Anemia Panel: Recent Labs    02/13/23 2156 02/14/23 1303  VITAMINB12 3,118*  --   FERRITIN  --  1,481*  TIBC  --  381  IRON  --  187*    Recent Results (from the past 240 hours)  Urine Culture     Status: None (Preliminary result)   Collection Time: 02/13/23  1:39 AM   Specimen: Urine, Random  Result Value Ref Range Status   Specimen Description   Final  URINE, RANDOM Performed at [facility], [address], [place], [place] [address]    Special Requests   Final    NONE Reflexed from [id number] Performed at [facility], [address] [place], [place] [address]    Culture   Final    CULTURE REINCUBATED FOR BETTER GROWTH Performed at [facility], [address], [place], [place] [address]    Report Status PENDING  Incomplete     Radiology Studies: US BIOPSY (LIVER) Result Date: 02/15/2023 INDICATION: 58 year old with elevated liver enzymes. Request for random liver biopsy. EXAM: ULTRASOUND-GUIDED LIVER BIOPSY MEDICATIONS: Moderate sedation ANESTHESIA/SEDATION: Moderate (conscious) sedation was employed during this procedure. A total of Versed 4 mg and Fentanyl 150 mcg was administered intravenously by the radiology nurse. Total intra-service moderate Sedation Time: 15 minutes. The patient's level of consciousness and vital signs were monitored continuously by radiology nursing throughout the procedure under my direct supervision. FLUOROSCOPY TIME:  None COMPLICATIONS: None immediate. PROCEDURE: Informed written consent was obtained from the patient after a thorough discussion of the procedural risks, benefits and alternatives. All questions were addressed. Maximal Sterile Barrier Technique was utilized including caps, mask, sterile gowns, sterile gloves, sterile drape, hand hygiene and skin antiseptic. A timeout was performed prior to the initiation of the  procedure. Abdomen was evaluated with ultrasound. The right hepatic lobe was identified. The right side of the abdomen was prepped with chlorhexidine and sterile field was created. Skin was anesthetized using 1% lidocaine. Small incision was made. Using ultrasound guidance, 17 gauge coaxial needle was directed into the right hepatic lobe. Total of 3 core biopsies were performed with an 18 gauge core device. Three adequate specimens obtained and placed in formalin. Gel-Foam slurry was injected as the 17 gauge needle was removed. Bandage placed over the puncture site. FINDINGS: Core biopsies obtained from the right hepatic lobe. Patient had significant discomfort following the procedure. Follow-up ultrasound did not show any evidence of bleeding or fluid around the liver. IMPRESSION: Successful ultrasound-guided core biopsies from the right hepatic lobe. Electronically Signed   By: Richarda Overlie M.D.   On: 02/15/2023 18:46   MR ABDOMEN MRCP W WO CONTAST Result Date: 02/15/2023 CLINICAL DATA:  58 year old female with history of right upper quadrant abdominal pain. Biliary tract dilatation noted on recent ultrasound examination. EXAM: MRI ABDOMEN WITHOUT AND WITH CONTRAST (INCLUDING MRCP) TECHNIQUE: Multiplanar multisequence MR imaging of the abdomen was performed both before and after the administration of intravenous contrast. Heavily T2-weighted images of the biliary and pancreatic ducts were obtained, and three-dimensional MRCP images were rendered by post processing. CONTRAST:  4mL GADAVIST GADOBUTROL 1 MMOL/ML IV SOLN COMPARISON:  Abdominal MRI 05/19/2021. FINDINGS: Comment: Portions of today's examination are limited by substantial patient respiratory motion. Lower chest: Unremarkable. Hepatobiliary: No suspicious cystic or solid hepatic lesions. Status post cholecystectomy. Mild intra and extrahepatic biliary ductal dilatation again noted on MRCP images, with the proximal common bile duct measuring up to 7 mm.  No definite filling defect identified in the common bile duct to suggest choledocholithiasis. Pancreas: No pancreatic mass. Main pancreatic duct is upper limits of normal in caliber measuring up to 3 mm in the body of the pancreas. No peripancreatic fluid collections or inflammatory changes. Spleen:  Unremarkable. Adrenals/Urinary Tract: Bilateral kidneys and adrenal glands are normal in appearance. No hydroureteronephrosis in the visualized abdomen. Stomach/Bowel: Visualized portions are unremarkable. Vascular/Lymphatic: No aneurysm identified in the visualized abdominal vasculature. No lymphadenopathy noted in the abdomen. Other: No  significant volume of ascites noted in the visualized portions of the peritoneal cavity. Musculoskeletal: No aggressive appearing osseous lesions are noted in the visualized portions of the skeleton. IMPRESSION: 1. Status post cholecystectomy with mild intra and extrahepatic biliary ductal dilatation, similar to prior studies, presumably reflective of benign post cholecystectomy physiology. No choledocholithiasis or other cause of obstruction identified. Electronically Signed   By: Trudie Reed M.D.   On: 02/15/2023 07:52   MR 3D Recon At Scanner Result Date: 02/15/2023 CLINICAL DATA:  58 year old female with history of right upper quadrant abdominal pain. Biliary tract dilatation noted on recent ultrasound examination. EXAM: MRI ABDOMEN WITHOUT AND WITH CONTRAST (INCLUDING MRCP) TECHNIQUE: Multiplanar multisequence MR imaging of the abdomen was performed both before and after the administration of intravenous contrast. Heavily T2-weighted images of the biliary and pancreatic ducts were obtained, and three-dimensional MRCP images were rendered by post processing. CONTRAST:  4mL GADAVIST GADOBUTROL 1 MMOL/ML IV SOLN COMPARISON:  Abdominal MRI 05/19/2021. FINDINGS: Comment: Portions of today's examination are limited by substantial patient respiratory motion. Lower chest:  Unremarkable. Hepatobiliary: No suspicious cystic or solid hepatic lesions. Status post cholecystectomy. Mild intra and extrahepatic biliary ductal dilatation again noted on MRCP images, with the proximal common bile duct measuring up to 7 mm. No definite filling defect identified in the common bile duct to suggest choledocholithiasis. Pancreas: No pancreatic mass. Main pancreatic duct is upper limits of normal in caliber measuring up to 3 mm in the body of the pancreas. No peripancreatic fluid collections or inflammatory changes. Spleen:  Unremarkable. Adrenals/Urinary Tract: Bilateral kidneys and adrenal glands are normal in appearance. No hydroureteronephrosis in the visualized abdomen. Stomach/Bowel: Visualized portions are unremarkable. Vascular/Lymphatic: No aneurysm identified in the visualized abdominal vasculature. No lymphadenopathy noted in the abdomen. Other: No significant volume of ascites noted in the visualized portions of the peritoneal cavity. Musculoskeletal: No aggressive appearing osseous lesions are noted in the visualized portions of the skeleton. IMPRESSION: 1. Status post cholecystectomy with mild intra and extrahepatic biliary ductal dilatation, similar to prior studies, presumably reflective of benign post cholecystectomy physiology. No choledocholithiasis or other cause of obstruction identified. Electronically Signed   By: Trudie Reed M.D.   On: 02/15/2023 07:52   US Abdomen Limited RUQ (LIVER/GB) Result Date: 02/14/2023 CLINICAL DATA:  221910 Elevated LFTs 221910. EXAM: ULTRASOUND ABDOMEN LIMITED RIGHT UPPER QUADRANT COMPARISON:  None Available. FINDINGS: Gallbladder: Surgically absent. Common bile duct: Diameter: 13 mm. Dilated extrahepatic bile duct which may be due to postcholecystectomy status. There is also mild central intrahepatic bile duct dilation. Distal common bile duct is not seen obscuration by overlying bowel gases. Correlate clinically and with liver function  tests to determine the need for further imaging with MRI/MRCP. Liver: No focal lesion identified. Within normal limits in parenchymal echogenicity. Portal vein is patent on color Doppler imaging with normal direction of blood flow towards the liver. Other: None. IMPRESSION: *Surgically absent gallbladder. *Dilated intra and extrahepatic bile ducts, as discussed above. Correlate clinically and with liver function tests to determine the need for further imaging with MRI/MRCP. Electronically Signed   By: Jules Schick M.D.   On: 02/14/2023 13:06    Scheduled Meds:  amLODipine  5 mg Oral Daily   capsaicin   Topical BID   lidocaine  1 patch Transdermal Q24H   thiamine  100 mg Oral Daily   Continuous Infusions:   LOS: 0 days   Time spent: 40 minutes  Carollee Herter, DO  [facility]  02/16/2023, 11:03 AM

## 2023-02-16 NOTE — Discharge Summary (Signed)
[facility] Physician Discharge Summary   Patient name: Gabriela Campbell  Admit date:     02/13/2023  Discharge date: 02/16/2023  Attending Physician: Dolly Rias [[id number]]  Discharge Physician: Carollee Herter   PCP: Annita Brod, MD  Admitted From: Home  Disposition:  Home  Recommendations for Outpatient Follow-up:  Follow up with PCP in 1-2 weeks Follow with [facility] in 2-3 weeks. Please follow up on the following pending results: Liver biopsy results  Home Health:No Equipment/Devices: None  Discharge Condition:Stable CODE STATUS:DNR/DNI Diet recommendation: Regular Fluid Restriction: None  Hospital Summary: HPI: JACY BROCKER is a 58 y.o. female with hx of relapsing remitting multiple sclerosis, hemochromatosis, abnormal LFT, hypertension, mood disorder, who presented due to worsening weakness and sensory changes in her lower extremities.  Reports that these changes have been ongoing for the past 3 to 4 days.  Describes weakness in her hip flexors and around the knee, paresthesias along the anterior thighs, worsening numbness of both feet.  Also has paresthesias in both hands.  Otherwise no history of headache, speech change, vision changes, head injury.  Denies any dysuria or recent urinary changes.  Did have an episode of vomiting about 2 weeks ago but no other GI symptoms at this time.  No fevers.   Significant Events: Admitted 02/13/2023 for UTI   Significant Labs: WBC 13.9, Hgb 15.7, plt 194 Na 139, K 3.8, CO2 of 25, BUN 16, scr 0.67 UA cloudy, small LE, negative nitrite, WBC clumps  Significant Imaging Studies: MRI brain, c-spine, t-spine, L-spine shows  Unchanged appearance/distribution of chronic demyelinating lesions intracranially and in the spinal cord. No enhancing lesions to suggest active demyelination. . No significant canal or foraminal stenosis. CXR No active cardiopulmonary disease  MRCP negative for bilary obstruction Antibiotic  Therapy: Anti-infectives (From admission, onward)    Start     Dose/Rate Route Frequency Ordered Stop   02/14/23 0230  fosfomycin (MONUROL) packet 3 g        3 g Oral  Once 02/14/23 0220 02/14/23 0232       Procedures: 02-15-2023 liver biopsy  Consultants: GI   Hospital Course by Problem: * Urinary tract infection 02-15-2023 Treated with po fosfomycin. Likely the cause of her weakness. NOT a MS exacerbation.  02-16-2023 no growth on urine cx.  Elevated LFTs 02-14-2023 Hx of elevated LFTs. But usually AST/ALT are not this high. Check RUQ U/S. Last liver biopsy in 06-2020 showed "Her last liver biopsy showed concern for chronic hepatitis with grade 2 or 3 fibrosis.". she does have hx of hemochromatosis.  Pt was referred to [facility] but never went to appointment.  Appears pt referred to [facility] transplant center but was rejected due to ineligibility.    02-15-2023 thanks for GI consult. MRCP negative for biliary obstruction. GI has ordered liver biopsy today and other labs are pending. Plan for DC tomorrow.  02-16-2023 LFTs have improved but not normal. Stable for DC. F/U with GI regarding liver biopsy results and rest of labs ordered.  Essential hypertension Stable. Hold HTN meds for SBP <120.  02-16-2023 reduce dose of norvasc to 2.5 mg daily.  Relapsing remitting multiple sclerosis (HCC) Chronic. 02-16-2023 have provide small supply of oxycodone for her LE pain. F/u with PCP or pain clinic for continued Rx.    Discharge Diagnoses:  Principal Problem:   Urinary tract infection Active Problems:   Elevated LFTs   Relapsing remitting multiple sclerosis (HCC)   Essential hypertension   Abnormal findings on diagnostic imaging  of liver and biliary tract   Transaminitis   Discharge Instructions  Discharge Instructions     Call MD for:  difficulty breathing, headache or visual disturbances   Complete by: As directed    Call MD for:  extreme fatigue   Complete by: As directed     Call MD for:  hives   Complete by: As directed    Call MD for:  persistant dizziness or light-headedness   Complete by: As directed    Call MD for:  persistant nausea and vomiting   Complete by: As directed    Call MD for:  redness, tenderness, or signs of infection (pain, swelling, redness, odor or green/yellow discharge around incision site)   Complete by: As directed    Call MD for:  severe uncontrolled pain   Complete by: As directed    Call MD for:  temperature >100.4   Complete by: As directed    Diet - low sodium heart healthy   Complete by: As directed    Discharge instructions   Complete by: As directed    1. Follow up with your primary care provider in 1 week after discharge from hospital. 2. Call Gastroenterology office on Monday, Feb 18, 2023 for an appointment in 2 weeks.  Office # is [phone] 3. Follow up with your neurology in 1-2 weeks regarding your Multiple Sclerosis.   Increase activity slowly   Complete by: As directed       Allergies as of 02/16/2023       Reactions   Dilaudid [hydromorphone Hcl] Anaphylaxis   Fish-derived Products Anaphylaxis   Ibuprofen Anaphylaxis   Iodine Anaphylaxis   Penicillins Anaphylaxis   Has patient had a PCN reaction causing immediate rash, facial/tongue/throat swelling, SOB or lightheadedness with hypotension:yes Has patient had a PCN reaction causing severe rash involving mucus membranes or skin necrosis: no Has patient had a PCN reaction that required hospitalization no Has patient had a PCN reaction occurring within the last 10 years: no If all of the above answers are "NO", then may proceed with Cephalosporin use.   Shellfish-derived Products Anaphylaxis   Shrimp Extract Anaphylaxis   Baclofen Other (See Comments)   Makes the patient feel wobbly   Gabapentin Other (See Comments)   GI upset   Codeine Nausea Only   Tramadol Nausea And Vomiting        Medication List     TAKE these medications     amLODipine 5 MG tablet Commonly known as: NORVASC Take 0.5 tablets (2.5 mg total) by mouth daily. What changed: how much to take   cyclobenzaprine 10 MG tablet Commonly known as: FLEXERIL Take 1 tablet (10 mg total) by mouth 2 (two) times daily as needed for muscle spasms.   Oxycodone HCl 10 MG Tabs Take 0.5 tablets (5 mg total) by mouth every 4 (four) hours as needed for up to 7 days for severe pain (pain score 7-10) or moderate pain (pain score 4-6). What changed:  medication strength when to take this reasons to take this   thiamine 100 MG tablet Commonly known as: Vitamin B-1 Take 100 mg by mouth daily.   Vitamin D3 25 MCG (1000 UT) Caps Take 2,000 Units by mouth daily.               Durable Medical Equipment  (From admission, onward)           Start     Ordered   02/14/23 1357  For home  use only DME Other see comment  Once       Comments: narrow adult wheelchair.  Question:  Length of Need  Answer:  Lifetime   02/14/23 1356            Allergies  Allergen Reactions   Dilaudid [Hydromorphone Hcl] Anaphylaxis   Fish-Derived Products Anaphylaxis   Ibuprofen Anaphylaxis   Iodine Anaphylaxis   Penicillins Anaphylaxis    Has patient had a PCN reaction causing immediate rash, facial/tongue/throat swelling, SOB or lightheadedness with hypotension:yes Has patient had a PCN reaction causing severe rash involving mucus membranes or skin necrosis: no Has patient had a PCN reaction that required hospitalization no Has patient had a PCN reaction occurring within the last 10 years: no If all of the above answers are "NO", then may proceed with Cephalosporin use.    Shellfish-Derived Products Anaphylaxis   Shrimp Extract Anaphylaxis   Baclofen Other (See Comments)    Makes the patient feel wobbly   Gabapentin Other (See Comments)    GI upset   Codeine Nausea Only   Tramadol Nausea And Vomiting    Discharge Exam: Vitals:   02/15/23 2128 02/16/23 0544   BP: 107/84 113/78  Pulse: 87 78  Resp: 18 17  Temp: 97.8 F (36.6 C) 98.4 F (36.9 C)  SpO2: 99% 97%    Physical Exam Vitals and nursing note reviewed.  Constitutional:      General: She is not in acute distress.    Appearance: She is not toxic-appearing or diaphoretic.  HENT:     Head: Normocephalic and atraumatic.     Nose: Nose normal.  Eyes:     General: No scleral icterus. Cardiovascular:     Rate and Rhythm: Normal rate and regular rhythm.  Pulmonary:     Effort: Pulmonary effort is normal.     Breath sounds: Normal breath sounds.  Abdominal:     General: Abdomen is flat. Bowel sounds are normal.     Palpations: Abdomen is soft.  Musculoskeletal:     Right lower leg: No edema.     Left lower leg: No edema.  Skin:    General: Skin is warm and dry.     Capillary Refill: Capillary refill takes less than 2 seconds.  Neurological:     Mental Status: She is alert and oriented to person, place, and time.     The results of significant diagnostics from this hospitalization (including imaging, microbiology, ancillary and laboratory) are listed below for reference.    Microbiology: Recent Results (from the past 240 hours)  Urine Culture     Status: None (Preliminary result)   Collection Time: 02/13/23  1:39 AM   Specimen: Urine, Random  Result Value Ref Range Status   Specimen Description   Final    URINE, RANDOM Performed at [facility], [address] [place], [place] [address]    Special Requests   Final    NONE Reflexed from [id number] Performed at [facility], [address], [place], [place] [address]    Culture   Final    CULTURE REINCUBATED FOR BETTER GROWTH Performed at [facility], [address], [place], [place] [address]    Report Status PENDING  Incomplete     Labs: BNP (last 3 results) No results for input(s): "BNP" in the last 8760 hours. Basic Metabolic Panel: Recent Labs  Lab 02/13/23 1823  02/14/23 0500 02/15/23 0324 02/16/23 0329  NA 139 136 136 136  K 3.8 3.9 4.6 4.4  CL 102 101 101 101  CO2 25 26 25 25   GLUCOSE 103* 106* 100* 87  BUN 16 17 23* 24*  CREATININE 0.67 0.67 0.83 0.73  CALCIUM 9.5 9.2 9.1 9.2  MG  --  2.1  --   --   PHOS  --  4.3  --   --    Liver Function Tests: Recent Labs  Lab 02/14/23 0500 02/15/23 0324 02/16/23 0329  AST 849* 452* 363*  ALT 373* 576* 464*  ALKPHOS 524* 575* 591*  BILITOT 1.0 0.4 0.7  PROT 7.0 7.0 7.0  ALBUMIN 3.4* 3.5 3.4*   CBC: Recent Labs  Lab 02/13/23 1823 02/14/23 0500 02/15/23 0324 02/16/23 0329  WBC 13.9* 16.7* 8.5 12.3*  NEUTROABS 10.8*  --  4.9 8.5*  HGB 15.7* 14.3 14.3 14.4  HCT 46.4* 43.7 44.2 44.8  MCV 95.3 96.5 98.0 97.6  PLT 194 193 165 186   Cardiac Enzymes: Recent Labs  Lab 02/13/23 2156  CKTOTAL 61   Anemia work up Recent Labs    02/13/23 2156 02/14/23 1303  VITAMINB12 3,118*  --   FERRITIN  --  1,481*  TIBC  --  381  IRON  --  187*   Urinalysis    Component Value Date/Time   COLORURINE YELLOW 02/13/2023 0139   APPEARANCEUR CLOUDY (A) 02/13/2023 0139   LABSPEC 1.012 02/13/2023 0139   PHURINE 7.0 02/13/2023 0139   GLUCOSEU NEGATIVE 02/13/2023 0139   HGBUR SMALL (A) 02/13/2023 0139   BILIRUBINUR NEGATIVE 02/13/2023 0139   KETONESUR NEGATIVE 02/13/2023 0139   PROTEINUR 30 (A) 02/13/2023 0139   NITRITE NEGATIVE 02/13/2023 0139   LEUKOCYTESUR SMALL (A) 02/13/2023 0139   Sepsis Labs Recent Labs  Lab 02/13/23 1823 02/14/23 0500 02/15/23 0324 02/16/23 0329  WBC 13.9* 16.7* 8.5 12.3*   Elevated LFT workup: Recent Labs    02/14/23 1538  LKM1AB 0.5   Recent Labs    02/14/23 1538  FACTIN 3   Mitochondrial M2 Ab, IgG <20.0   Recent Labs    02/14/23 1538  ANA Negative   Procedures/Studies: US BIOPSY (LIVER) Result Date: 02/15/2023 INDICATION: 58 year old with elevated liver enzymes. Request for random liver biopsy. EXAM: ULTRASOUND-GUIDED LIVER BIOPSY  MEDICATIONS: Moderate sedation ANESTHESIA/SEDATION: Moderate (conscious) sedation was employed during this procedure. A total of Versed 4 mg and Fentanyl 150 mcg was administered intravenously by the radiology nurse. Total intra-service moderate Sedation Time: 15 minutes. The patient's level of consciousness and vital signs were monitored continuously by radiology nursing throughout the procedure under my direct supervision. FLUOROSCOPY TIME:  None COMPLICATIONS: None immediate. PROCEDURE: Informed written consent was obtained from the patient after a thorough discussion of the procedural risks, benefits and alternatives. All questions were addressed. Maximal Sterile Barrier Technique was utilized including caps, mask, sterile gowns, sterile gloves, sterile drape, hand hygiene and skin antiseptic. A timeout was performed prior to the initiation of the procedure. Abdomen was evaluated with ultrasound. The right hepatic lobe was identified. The right side of the abdomen was prepped with chlorhexidine and sterile field was created. Skin was anesthetized using 1% lidocaine. Small incision was made. Using ultrasound guidance, 17 gauge coaxial needle was directed into the right hepatic lobe. Total of 3 core biopsies were performed with an 18 gauge core device. Three adequate specimens obtained and placed in formalin. Gel-Foam slurry was injected as the 17 gauge needle was removed. Bandage placed over the puncture site. FINDINGS: Core biopsies obtained from the right  hepatic lobe. Patient had significant discomfort following the procedure. Follow-up ultrasound did not show any evidence of bleeding or fluid around the liver. IMPRESSION: Successful ultrasound-guided core biopsies from the right hepatic lobe. Electronically Signed   By: Richarda Overlie M.D.   On: 02/15/2023 18:46   MR ABDOMEN MRCP W WO CONTAST Result Date: 02/15/2023 CLINICAL DATA:  58 year old female with history of right upper quadrant abdominal pain. Biliary  tract dilatation noted on recent ultrasound examination. EXAM: MRI ABDOMEN WITHOUT AND WITH CONTRAST (INCLUDING MRCP) TECHNIQUE: Multiplanar multisequence MR imaging of the abdomen was performed both before and after the administration of intravenous contrast. Heavily T2-weighted images of the biliary and pancreatic ducts were obtained, and three-dimensional MRCP images were rendered by post processing. CONTRAST:  4mL GADAVIST GADOBUTROL 1 MMOL/ML IV SOLN COMPARISON:  Abdominal MRI 05/19/2021. FINDINGS: Comment: Portions of today's examination are limited by substantial patient respiratory motion. Lower chest: Unremarkable. Hepatobiliary: No suspicious cystic or solid hepatic lesions. Status post cholecystectomy. Mild intra and extrahepatic biliary ductal dilatation again noted on MRCP images, with the proximal common bile duct measuring up to 7 mm. No definite filling defect identified in the common bile duct to suggest choledocholithiasis. Pancreas: No pancreatic mass. Main pancreatic duct is upper limits of normal in caliber measuring up to 3 mm in the body of the pancreas. No peripancreatic fluid collections or inflammatory changes. Spleen:  Unremarkable. Adrenals/Urinary Tract: Bilateral kidneys and adrenal glands are normal in appearance. No hydroureteronephrosis in the visualized abdomen. Stomach/Bowel: Visualized portions are unremarkable. Vascular/Lymphatic: No aneurysm identified in the visualized abdominal vasculature. No lymphadenopathy noted in the abdomen. Other: No significant volume of ascites noted in the visualized portions of the peritoneal cavity. Musculoskeletal: No aggressive appearing osseous lesions are noted in the visualized portions of the skeleton. IMPRESSION: 1. Status post cholecystectomy with mild intra and extrahepatic biliary ductal dilatation, similar to prior studies, presumably reflective of benign post cholecystectomy physiology. No choledocholithiasis or other cause of  obstruction identified. Electronically Signed   By: Trudie Reed M.D.   On: 02/15/2023 07:52   MR 3D Recon At Scanner Result Date: 02/15/2023 CLINICAL DATA:  58 year old female with history of right upper quadrant abdominal pain. Biliary tract dilatation noted on recent ultrasound examination. EXAM: MRI ABDOMEN WITHOUT AND WITH CONTRAST (INCLUDING MRCP) TECHNIQUE: Multiplanar multisequence MR imaging of the abdomen was performed both before and after the administration of intravenous contrast. Heavily T2-weighted images of the biliary and pancreatic ducts were obtained, and three-dimensional MRCP images were rendered by post processing. CONTRAST:  4mL GADAVIST GADOBUTROL 1 MMOL/ML IV SOLN COMPARISON:  Abdominal MRI 05/19/2021. FINDINGS: Comment: Portions of today's examination are limited by substantial patient respiratory motion. Lower chest: Unremarkable. Hepatobiliary: No suspicious cystic or solid hepatic lesions. Status post cholecystectomy. Mild intra and extrahepatic biliary ductal dilatation again noted on MRCP images, with the proximal common bile duct measuring up to 7 mm. No definite filling defect identified in the common bile duct to suggest choledocholithiasis. Pancreas: No pancreatic mass. Main pancreatic duct is upper limits of normal in caliber measuring up to 3 mm in the body of the pancreas. No peripancreatic fluid collections or inflammatory changes. Spleen:  Unremarkable. Adrenals/Urinary Tract: Bilateral kidneys and adrenal glands are normal in appearance. No hydroureteronephrosis in the visualized abdomen. Stomach/Bowel: Visualized portions are unremarkable. Vascular/Lymphatic: No aneurysm identified in the visualized abdominal vasculature. No lymphadenopathy noted in the abdomen. Other: No significant volume of ascites noted in the visualized portions of the peritoneal cavity. Musculoskeletal: No aggressive  appearing osseous lesions are noted in the visualized portions of the skeleton.  IMPRESSION: 1. Status post cholecystectomy with mild intra and extrahepatic biliary ductal dilatation, similar to prior studies, presumably reflective of benign post cholecystectomy physiology. No choledocholithiasis or other cause of obstruction identified. Electronically Signed   By: Trudie Reed M.D.   On: 02/15/2023 07:52   US Abdomen Limited RUQ (LIVER/GB) Result Date: 02/14/2023 CLINICAL DATA:  221910 Elevated LFTs 221910. EXAM: ULTRASOUND ABDOMEN LIMITED RIGHT UPPER QUADRANT COMPARISON:  None Available. FINDINGS: Gallbladder: Surgically absent. Common bile duct: Diameter: 13 mm. Dilated extrahepatic bile duct which may be due to postcholecystectomy status. There is also mild central intrahepatic bile duct dilation. Distal common bile duct is not seen obscuration by overlying bowel gases. Correlate clinically and with liver function tests to determine the need for further imaging with MRI/MRCP. Liver: No focal lesion identified. Within normal limits in parenchymal echogenicity. Portal vein is patent on color Doppler imaging with normal direction of blood flow towards the liver. Other: None. IMPRESSION: *Surgically absent gallbladder. *Dilated intra and extrahepatic bile ducts, as discussed above. Correlate clinically and with liver function tests to determine the need for further imaging with MRI/MRCP. Electronically Signed   By: Jules Schick M.D.   On: 02/14/2023 13:06   DG Chest 2 View Result Date: 02/14/2023 CLINICAL DATA:  Elevated white blood cell count. EXAM: CHEST - 2 VIEW COMPARISON:  05/07/2022 FINDINGS: Biapical scarring, stable. Heart is normal size. No acute confluent opacities or effusions. No acute bony abnormality. IMPRESSION: No active cardiopulmonary disease. Electronically Signed   By: Charlett Nose M.D.   On: 02/14/2023 01:23   MR Brain W and Wo Contrast Result Date: 02/14/2023 EXAM: MRI HEAD WITHOUT AND WITH CONTRAST MRI CERVICAL SPINE WITHOUT AND WITH CONTRAST MRI CERVICAL  THORACIC WITHOUT AND WITH CONTRAST MRI LUMBAR WITHOUT AND WITH CONTRAST CONTRAST:  4mL GADAVIST GADOBUTROL 1 MMOL/ML IV SOLN TECHNIQUE: Multiplanar, multiecho pulse sequences of the brain and surrounding structures, and cervical, thoracic and lumbar spine were obtained without and with intravenous contrast. COMPARISON:  None Available. FINDINGS: MRI HEAD FINDINGS Motion limited.  Within this limitation: Brain: No acute infarction, hemorrhage, hydrocephalus, extra-axial collection or mass lesion. Similar scattered T2/FLAIR hyperintensity in the white matter, compatible with chronic demyelination. No pathologic enhancement. Vascular: Major arterial flow voids are maintained. Skull and upper cervical spine: Normal marrow signal. Sinuses/Orbits: Clear sinuses.  No acute orbital findings. Other: No mastoid effusions. MRI CERVICAL SPINE FINDINGS Alignment: No substantial sagittal subluxation. Vertebrae: No fracture, evidence of discitis, or bone lesion. Cord: Similar appearance of multifocal T2 hyperintense cord lesions spanning C2-C5 on motion limited assessment. 6 no clearly new cord lesion. No abnormal cord enhancement. Posterior Fossa, vertebral arteries, paraspinal tissues: Negative. Disc levels: Facet and uncovertebral hypertrophy without significant canal or foraminal stenosis. MRI THORACIC SPINE FINDINGS Alignment: Exaggerated thoracic kyphosis. No substantial sagittal subluxation. Vertebrae: No fracture, evidence of discitis, or suspicious bone lesion. Cord: No substantial change in multifocal T2 hyperintensity in the cord at T5, T6-T7 and T8-T9. no pathologic enhancement. Paraspinal and other soft tissues: Unremarkable. Disc levels: No significant canal or foraminal stenosis. MRI LUMBAR SPINE FINDINGS Alignment: No substantial sagittal subluxation. Vertebrae: No fracture, evidence of discitis, or bone lesion. Cord:  Normal. Paraspinal and other soft tissues: Unremarkable. Disc levels: T12-L1: No significant disc  protrusion, foraminal stenosis, or canal stenosis. L1-L2: No significant disc protrusion, foraminal stenosis, or canal stenosis. L2-L3: Mild disc bulging.  No significant stenosis. L3-L4: Mild disc bulging.  No  significant stenosis. L4-L5: Mild disc bulging. Ligamentum flavum thickening facet arthropathy. No significant stenosis. L5-S1: Mild disc bulging and endplate spurring. Facet arthropathy. No significant stenosis. IMPRESSION: 1. Unchanged appearance/distribution of chronic demyelinating lesions intracranially and in the spinal cord. No enhancing lesions to suggest active demyelination. 2. No significant canal or foraminal stenosis. Electronically Signed   By: Feliberto Harts M.D.   On: 02/14/2023 00:09   MR Cervical Spine W and Wo Contrast Result Date: 02/14/2023 EXAM: MRI HEAD WITHOUT AND WITH CONTRAST MRI CERVICAL SPINE WITHOUT AND WITH CONTRAST MRI CERVICAL THORACIC WITHOUT AND WITH CONTRAST MRI LUMBAR WITHOUT AND WITH CONTRAST CONTRAST:  4mL GADAVIST GADOBUTROL 1 MMOL/ML IV SOLN TECHNIQUE: Multiplanar, multiecho pulse sequences of the brain and surrounding structures, and cervical, thoracic and lumbar spine were obtained without and with intravenous contrast. COMPARISON:  None Available. FINDINGS: MRI HEAD FINDINGS Motion limited.  Within this limitation: Brain: No acute infarction, hemorrhage, hydrocephalus, extra-axial collection or mass lesion. Similar scattered T2/FLAIR hyperintensity in the white matter, compatible with chronic demyelination. No pathologic enhancement. Vascular: Major arterial flow voids are maintained. Skull and upper cervical spine: Normal marrow signal. Sinuses/Orbits: Clear sinuses.  No acute orbital findings. Other: No mastoid effusions. MRI CERVICAL SPINE FINDINGS Alignment: No substantial sagittal subluxation. Vertebrae: No fracture, evidence of discitis, or bone lesion. Cord: Similar appearance of multifocal T2 hyperintense cord lesions spanning C2-C5 on motion limited  assessment. 6 no clearly new cord lesion. No abnormal cord enhancement. Posterior Fossa, vertebral arteries, paraspinal tissues: Negative. Disc levels: Facet and uncovertebral hypertrophy without significant canal or foraminal stenosis. MRI THORACIC SPINE FINDINGS Alignment: Exaggerated thoracic kyphosis. No substantial sagittal subluxation. Vertebrae: No fracture, evidence of discitis, or suspicious bone lesion. Cord: No substantial change in multifocal T2 hyperintensity in the cord at T5, T6-T7 and T8-T9. no pathologic enhancement. Paraspinal and other soft tissues: Unremarkable. Disc levels: No significant canal or foraminal stenosis. MRI LUMBAR SPINE FINDINGS Alignment: No substantial sagittal subluxation. Vertebrae: No fracture, evidence of discitis, or bone lesion. Cord:  Normal. Paraspinal and other soft tissues: Unremarkable. Disc levels: T12-L1: No significant disc protrusion, foraminal stenosis, or canal stenosis. L1-L2: No significant disc protrusion, foraminal stenosis, or canal stenosis. L2-L3: Mild disc bulging.  No significant stenosis. L3-L4: Mild disc bulging.  No significant stenosis. L4-L5: Mild disc bulging. Ligamentum flavum thickening facet arthropathy. No significant stenosis. L5-S1: Mild disc bulging and endplate spurring. Facet arthropathy. No significant stenosis. IMPRESSION: 1. Unchanged appearance/distribution of chronic demyelinating lesions intracranially and in the spinal cord. No enhancing lesions to suggest active demyelination. 2. No significant canal or foraminal stenosis. Electronically Signed   By: Feliberto Harts M.D.   On: 02/14/2023 00:09   MR THORACIC SPINE W WO CONTRAST Result Date: 02/14/2023 EXAM: MRI HEAD WITHOUT AND WITH CONTRAST MRI CERVICAL SPINE WITHOUT AND WITH CONTRAST MRI CERVICAL THORACIC WITHOUT AND WITH CONTRAST MRI LUMBAR WITHOUT AND WITH CONTRAST CONTRAST:  4mL GADAVIST GADOBUTROL 1 MMOL/ML IV SOLN TECHNIQUE: Multiplanar, multiecho pulse sequences of the  brain and surrounding structures, and cervical, thoracic and lumbar spine were obtained without and with intravenous contrast. COMPARISON:  None Available. FINDINGS: MRI HEAD FINDINGS Motion limited.  Within this limitation: Brain: No acute infarction, hemorrhage, hydrocephalus, extra-axial collection or mass lesion. Similar scattered T2/FLAIR hyperintensity in the white matter, compatible with chronic demyelination. No pathologic enhancement. Vascular: Major arterial flow voids are maintained. Skull and upper cervical spine: Normal marrow signal. Sinuses/Orbits: Clear sinuses.  No acute orbital findings. Other: No mastoid effusions. MRI CERVICAL SPINE FINDINGS Alignment:  No substantial sagittal subluxation. Vertebrae: No fracture, evidence of discitis, or bone lesion. Cord: Similar appearance of multifocal T2 hyperintense cord lesions spanning C2-C5 on motion limited assessment. 6 no clearly new cord lesion. No abnormal cord enhancement. Posterior Fossa, vertebral arteries, paraspinal tissues: Negative. Disc levels: Facet and uncovertebral hypertrophy without significant canal or foraminal stenosis. MRI THORACIC SPINE FINDINGS Alignment: Exaggerated thoracic kyphosis. No substantial sagittal subluxation. Vertebrae: No fracture, evidence of discitis, or suspicious bone lesion. Cord: No substantial change in multifocal T2 hyperintensity in the cord at T5, T6-T7 and T8-T9. no pathologic enhancement. Paraspinal and other soft tissues: Unremarkable. Disc levels: No significant canal or foraminal stenosis. MRI LUMBAR SPINE FINDINGS Alignment: No substantial sagittal subluxation. Vertebrae: No fracture, evidence of discitis, or bone lesion. Cord:  Normal. Paraspinal and other soft tissues: Unremarkable. Disc levels: T12-L1: No significant disc protrusion, foraminal stenosis, or canal stenosis. L1-L2: No significant disc protrusion, foraminal stenosis, or canal stenosis. L2-L3: Mild disc bulging.  No significant stenosis.  L3-L4: Mild disc bulging.  No significant stenosis. L4-L5: Mild disc bulging. Ligamentum flavum thickening facet arthropathy. No significant stenosis. L5-S1: Mild disc bulging and endplate spurring. Facet arthropathy. No significant stenosis. IMPRESSION: 1. Unchanged appearance/distribution of chronic demyelinating lesions intracranially and in the spinal cord. No enhancing lesions to suggest active demyelination. 2. No significant canal or foraminal stenosis. Electronically Signed   By: Feliberto Harts M.D.   On: 02/14/2023 00:09   MR Lumbar Spine W Wo Contrast Result Date: 02/14/2023 EXAM: MRI HEAD WITHOUT AND WITH CONTRAST MRI CERVICAL SPINE WITHOUT AND WITH CONTRAST MRI CERVICAL THORACIC WITHOUT AND WITH CONTRAST MRI LUMBAR WITHOUT AND WITH CONTRAST CONTRAST:  4mL GADAVIST GADOBUTROL 1 MMOL/ML IV SOLN TECHNIQUE: Multiplanar, multiecho pulse sequences of the brain and surrounding structures, and cervical, thoracic and lumbar spine were obtained without and with intravenous contrast. COMPARISON:  None Available. FINDINGS: MRI HEAD FINDINGS Motion limited.  Within this limitation: Brain: No acute infarction, hemorrhage, hydrocephalus, extra-axial collection or mass lesion. Similar scattered T2/FLAIR hyperintensity in the white matter, compatible with chronic demyelination. No pathologic enhancement. Vascular: Major arterial flow voids are maintained. Skull and upper cervical spine: Normal marrow signal. Sinuses/Orbits: Clear sinuses.  No acute orbital findings. Other: No mastoid effusions. MRI CERVICAL SPINE FINDINGS Alignment: No substantial sagittal subluxation. Vertebrae: No fracture, evidence of discitis, or bone lesion. Cord: Similar appearance of multifocal T2 hyperintense cord lesions spanning C2-C5 on motion limited assessment. 6 no clearly new cord lesion. No abnormal cord enhancement. Posterior Fossa, vertebral arteries, paraspinal tissues: Negative. Disc levels: Facet and uncovertebral hypertrophy  without significant canal or foraminal stenosis. MRI THORACIC SPINE FINDINGS Alignment: Exaggerated thoracic kyphosis. No substantial sagittal subluxation. Vertebrae: No fracture, evidence of discitis, or suspicious bone lesion. Cord: No substantial change in multifocal T2 hyperintensity in the cord at T5, T6-T7 and T8-T9. no pathologic enhancement. Paraspinal and other soft tissues: Unremarkable. Disc levels: No significant canal or foraminal stenosis. MRI LUMBAR SPINE FINDINGS Alignment: No substantial sagittal subluxation. Vertebrae: No fracture, evidence of discitis, or bone lesion. Cord:  Normal. Paraspinal and other soft tissues: Unremarkable. Disc levels: T12-L1: No significant disc protrusion, foraminal stenosis, or canal stenosis. L1-L2: No significant disc protrusion, foraminal stenosis, or canal stenosis. L2-L3: Mild disc bulging.  No significant stenosis. L3-L4: Mild disc bulging.  No significant stenosis. L4-L5: Mild disc bulging. Ligamentum flavum thickening facet arthropathy. No significant stenosis. L5-S1: Mild disc bulging and endplate spurring. Facet arthropathy. No significant stenosis. IMPRESSION: 1. Unchanged appearance/distribution of chronic demyelinating lesions intracranially and in  the spinal cord. No enhancing lesions to suggest active demyelination. 2. No significant canal or foraminal stenosis. Electronically Signed   By: Feliberto Harts M.D.   On: 02/14/2023 00:09    Time coordinating discharge: 45 mins  SIGNED:  Carollee Herter, DO [facility] 02/16/23, 11:04 AM

## 2023-02-16 NOTE — Plan of Care (Signed)

## 2023-02-17 LAB — URINE CULTURE: Culture: >=100000 — AB

## 2023-02-18 LAB — GLIA (IGA/G) + TTG IGA
Antigliadin Abs, IgA: 7 U (ref 0–19)
Gliadin IgG: 2 U (ref 0–19)
Tissue Transglutaminase Ab, IgA: <2 U/mL (ref 0–3)

## 2023-02-18 NOTE — Telephone Encounter (Signed)
New patient appt scheduled for 02/21/23 at 1:30 pm

## 2023-02-20 LAB — SURGICAL PATHOLOGY

## 2023-02-22 ENCOUNTER — Other Ambulatory Visit: Payer: Self-pay

## 2023-02-22 DIAGNOSIS — K769 Liver disease, unspecified: Secondary | ICD-10-CM

## 2023-02-25 ENCOUNTER — Telehealth: Payer: Self-pay | Admitting: Physician Assistant

## 2023-02-25 ENCOUNTER — Other Ambulatory Visit: Payer: Self-pay

## 2023-02-25 DIAGNOSIS — K769 Liver disease, unspecified: Secondary | ICD-10-CM

## 2023-02-27 ENCOUNTER — Other Ambulatory Visit: Payer: Self-pay | Admitting: Nurse Practitioner

## 2023-02-27 DIAGNOSIS — M419 Scoliosis, unspecified: Secondary | ICD-10-CM | POA: Insufficient documentation

## 2023-02-27 DIAGNOSIS — K761 Chronic passive congestion of liver: Secondary | ICD-10-CM

## 2023-02-27 DIAGNOSIS — R932 Abnormal findings on diagnostic imaging of liver and biliary tract: Secondary | ICD-10-CM

## 2023-02-27 DIAGNOSIS — R7989 Other specified abnormal findings of blood chemistry: Secondary | ICD-10-CM

## 2023-03-03 DIAGNOSIS — K7402 Hepatic fibrosis, advanced fibrosis: Secondary | ICD-10-CM | POA: Insufficient documentation

## 2023-03-18 ENCOUNTER — Encounter: Payer: Self-pay | Admitting: Physician Assistant

## 2023-03-18 NOTE — Telephone Encounter (Signed)
 Patient called to reschedule appt. Patient states unable to make appt.

## 2023-03-20 ENCOUNTER — Other Ambulatory Visit: Payer: Medicare PPO

## 2023-03-20 ENCOUNTER — Ambulatory Visit: Payer: Medicare PPO | Admitting: Physician Assistant

## 2023-03-22 ENCOUNTER — Encounter: Payer: Self-pay | Admitting: Physician Assistant

## 2023-03-22 ENCOUNTER — Encounter: Payer: Self-pay | Admitting: Gastroenterology

## 2023-03-26 ENCOUNTER — Inpatient Hospital Stay: Admission: RE | Admit: 2023-03-26 | Source: Ambulatory Visit

## 2023-04-02 ENCOUNTER — Other Ambulatory Visit: Payer: Self-pay | Admitting: Physician Assistant

## 2023-04-02 DIAGNOSIS — R748 Abnormal levels of other serum enzymes: Secondary | ICD-10-CM

## 2023-04-03 ENCOUNTER — Inpatient Hospital Stay

## 2023-04-03 ENCOUNTER — Inpatient Hospital Stay: Attending: Physician Assistant | Admitting: Physician Assistant

## 2023-04-08 ENCOUNTER — Encounter (HOSPITAL_COMMUNITY): Payer: Self-pay

## 2023-04-08 ENCOUNTER — Inpatient Hospital Stay (HOSPITAL_COMMUNITY)
Admission: EM | Admit: 2023-04-08 | Discharge: 2023-04-12 | DRG: 060 | Disposition: A | Discharge status: Home Health | Attending: Internal Medicine | Admitting: Internal Medicine

## 2023-04-08 ENCOUNTER — Emergency Department (HOSPITAL_COMMUNITY)

## 2023-04-08 ENCOUNTER — Other Ambulatory Visit: Payer: Self-pay

## 2023-04-08 DIAGNOSIS — Z9181 History of falling: Secondary | ICD-10-CM

## 2023-04-08 DIAGNOSIS — Z720 Tobacco use: Secondary | ICD-10-CM | POA: Diagnosis not present

## 2023-04-08 DIAGNOSIS — R748 Abnormal levels of other serum enzymes: Secondary | ICD-10-CM | POA: Diagnosis present

## 2023-04-08 DIAGNOSIS — I10 Essential (primary) hypertension: Secondary | ICD-10-CM | POA: Diagnosis present

## 2023-04-08 DIAGNOSIS — Z888 Allergy status to other drugs, medicaments and biological substances status: Secondary | ICD-10-CM

## 2023-04-08 DIAGNOSIS — R29810 Facial weakness: Secondary | ICD-10-CM | POA: Diagnosis present

## 2023-04-08 DIAGNOSIS — Z716 Tobacco abuse counseling: Secondary | ICD-10-CM

## 2023-04-08 DIAGNOSIS — Z90710 Acquired absence of both cervix and uterus: Secondary | ICD-10-CM | POA: Diagnosis not present

## 2023-04-08 DIAGNOSIS — G35 Multiple sclerosis: Principal | ICD-10-CM | POA: Diagnosis present

## 2023-04-08 DIAGNOSIS — Z87892 Personal history of anaphylaxis: Secondary | ICD-10-CM

## 2023-04-08 DIAGNOSIS — R269 Unspecified abnormalities of gait and mobility: Secondary | ICD-10-CM | POA: Diagnosis present

## 2023-04-08 DIAGNOSIS — Z91013 Allergy to seafood: Secondary | ICD-10-CM

## 2023-04-08 DIAGNOSIS — F17210 Nicotine dependence, cigarettes, uncomplicated: Secondary | ICD-10-CM | POA: Diagnosis present

## 2023-04-08 DIAGNOSIS — G9389 Other specified disorders of brain: Secondary | ICD-10-CM | POA: Diagnosis present

## 2023-04-08 DIAGNOSIS — W19XXXA Unspecified fall, initial encounter: Secondary | ICD-10-CM | POA: Diagnosis present

## 2023-04-08 DIAGNOSIS — R278 Other lack of coordination: Secondary | ICD-10-CM | POA: Diagnosis present

## 2023-04-08 DIAGNOSIS — Z880 Allergy status to penicillin: Secondary | ICD-10-CM | POA: Diagnosis not present

## 2023-04-08 DIAGNOSIS — Z8619 Personal history of other infectious and parasitic diseases: Secondary | ICD-10-CM

## 2023-04-08 DIAGNOSIS — R531 Weakness: Principal | ICD-10-CM

## 2023-04-08 DIAGNOSIS — Z66 Do not resuscitate: Secondary | ICD-10-CM

## 2023-04-08 DIAGNOSIS — Z881 Allergy status to other antibiotic agents status: Secondary | ICD-10-CM

## 2023-04-08 DIAGNOSIS — Z809 Family history of malignant neoplasm, unspecified: Secondary | ICD-10-CM

## 2023-04-08 DIAGNOSIS — R739 Hyperglycemia, unspecified: Secondary | ICD-10-CM | POA: Diagnosis present

## 2023-04-08 DIAGNOSIS — Z9049 Acquired absence of other specified parts of digestive tract: Secondary | ICD-10-CM

## 2023-04-08 DIAGNOSIS — Z886 Allergy status to analgesic agent status: Secondary | ICD-10-CM

## 2023-04-08 DIAGNOSIS — F40240 Claustrophobia: Secondary | ICD-10-CM | POA: Diagnosis present

## 2023-04-08 DIAGNOSIS — M545 Low back pain, unspecified: Secondary | ICD-10-CM | POA: Diagnosis present

## 2023-04-08 DIAGNOSIS — R296 Repeated falls: Secondary | ICD-10-CM | POA: Diagnosis present

## 2023-04-08 DIAGNOSIS — Z885 Allergy status to narcotic agent status: Secondary | ICD-10-CM

## 2023-04-08 DIAGNOSIS — D751 Secondary polycythemia: Secondary | ICD-10-CM | POA: Diagnosis present

## 2023-04-08 DIAGNOSIS — R29898 Other symptoms and signs involving the musculoskeletal system: Secondary | ICD-10-CM

## 2023-04-08 DIAGNOSIS — Z79899 Other long term (current) drug therapy: Secondary | ICD-10-CM

## 2023-04-08 DIAGNOSIS — Z91041 Radiographic dye allergy status: Secondary | ICD-10-CM | POA: Diagnosis not present

## 2023-04-08 DIAGNOSIS — Y92099 Unspecified place in other non-institutional residence as the place of occurrence of the external cause: Secondary | ICD-10-CM | POA: Diagnosis not present

## 2023-04-08 DIAGNOSIS — F17200 Nicotine dependence, unspecified, uncomplicated: Secondary | ICD-10-CM | POA: Insufficient documentation

## 2023-04-08 LAB — URINALYSIS, ROUTINE W REFLEX MICROSCOPIC
Bacteria, UA: NONE SEEN
Bilirubin Urine: NEGATIVE
Glucose, UA: NEGATIVE mg/dL
Hgb urine dipstick: NEGATIVE
Ketones, ur: NEGATIVE mg/dL
Leukocytes,Ua: NEGATIVE
Nitrite: NEGATIVE
Protein, ur: 100 mg/dL — AB
Specific Gravity, Urine: 1.014 (ref 1.005–1.030)
pH: 7 (ref 5.0–8.0)

## 2023-04-08 LAB — CBC WITH DIFFERENTIAL/PLATELET
Abs Immature Granulocytes: 0.03 10*3/uL (ref 0.00–0.07)
Basophils Absolute: 0 10*3/uL (ref 0.0–0.1)
Basophils Relative: 0 %
Eosinophils Absolute: 0.1 10*3/uL (ref 0.0–0.5)
Eosinophils Relative: 1 %
HCT: 48.5 % — ABNORMAL HIGH (ref 36.0–46.0)
Hemoglobin: 16.1 g/dL — ABNORMAL HIGH (ref 12.0–15.0)
Immature Granulocytes: 0 %
Lymphocytes Relative: 26 %
Lymphs Abs: 2.6 10*3/uL (ref 0.7–4.0)
MCH: 32.1 pg (ref 26.0–34.0)
MCHC: 33.2 g/dL (ref 30.0–36.0)
MCV: 96.8 fL (ref 80.0–100.0)
Monocytes Absolute: 0.4 10*3/uL (ref 0.1–1.0)
Monocytes Relative: 4 %
Neutro Abs: 6.9 10*3/uL (ref 1.7–7.7)
Neutrophils Relative %: 69 %
Platelets: 187 10*3/uL (ref 150–400)
RBC: 5.01 MIL/uL (ref 3.87–5.11)
RDW: 13 % (ref 11.5–15.5)
WBC: 10 10*3/uL (ref 4.0–10.5)
nRBC: 0 % (ref 0.0–0.2)

## 2023-04-08 LAB — COMPREHENSIVE METABOLIC PANEL WITH GFR
ALT: 15 U/L (ref 0–44)
AST: 16 U/L (ref 15–41)
Albumin: 4 g/dL (ref 3.5–5.0)
Alkaline Phosphatase: 257 U/L — ABNORMAL HIGH (ref 38–126)
Anion gap: 9 (ref 5–15)
BUN: 13 mg/dL (ref 6–20)
CO2: 29 mmol/L (ref 22–32)
Calcium: 9.5 mg/dL (ref 8.9–10.3)
Chloride: 102 mmol/L (ref 98–111)
Creatinine, Ser: 0.73 mg/dL (ref 0.44–1.00)
GFR, Estimated: >60 mL/min (ref >60)
Glucose, Bld: 92 mg/dL (ref 70–99)
Potassium: 2.9 mmol/L — ABNORMAL LOW (ref 3.5–5.1)
Sodium: 140 mmol/L (ref 135–145)
Total Bilirubin: 0.5 mg/dL (ref 0.0–1.2)
Total Protein: 8.3 g/dL — ABNORMAL HIGH (ref 6.5–8.1)

## 2023-04-08 LAB — PROTIME-INR
INR: 0.9 (ref 0.8–1.2)
Prothrombin Time: 12.7 s (ref 11.4–15.2)

## 2023-04-08 LAB — PHOSPHORUS: Phosphorus: 2.9 mg/dL (ref 2.5–4.6)

## 2023-04-08 LAB — MAGNESIUM: Magnesium: 1.9 mg/dL (ref 1.7–2.4)

## 2023-04-08 LAB — AMMONIA: Ammonia: 14 umol/L (ref 9–35)

## 2023-04-08 MED ORDER — ACETAMINOPHEN 650 MG RE SUPP: 650.0000 mg | Freq: Four times a day (QID) | RECTAL | Status: DC | PRN

## 2023-04-08 MED ORDER — FENTANYL CITRATE PF 50 MCG/ML IJ SOSY
50.0000 ug | PREFILLED_SYRINGE | Freq: Once | INTRAMUSCULAR | Status: AC
  Administered 2023-04-08: 50 ug via INTRAVENOUS
  Filled 2023-04-08: qty 1

## 2023-04-08 MED ORDER — POTASSIUM CHLORIDE CRYS ER 20 MEQ PO TBCR
40.0000 meq | EXTENDED_RELEASE_TABLET | Freq: Once | ORAL | Status: AC
  Administered 2023-04-08: 40 meq via ORAL
  Filled 2023-04-08: qty 2

## 2023-04-08 MED ORDER — POTASSIUM CHLORIDE 10 MEQ/100ML IV SOLN
10.0000 meq | INTRAVENOUS | Status: AC
  Administered 2023-04-08 (×3): 10 meq via INTRAVENOUS
  Filled 2023-04-08 (×3): qty 100

## 2023-04-08 MED ORDER — ENOXAPARIN SODIUM 30 MG/0.3ML IJ SOSY
30.0000 mg | PREFILLED_SYRINGE | INTRAMUSCULAR | Status: DC
  Administered 2023-04-08 – 2023-04-11 (×4): 30 mg via SUBCUTANEOUS
  Filled 2023-04-08 (×4): qty 0.3

## 2023-04-08 MED ORDER — AMLODIPINE BESYLATE 5 MG PO TABS
2.5000 mg | ORAL_TABLET | Freq: Every day | ORAL | Status: DC
Start: 2023-04-09 — End: 2023-04-12
  Administered 2023-04-10 – 2023-04-12 (×3): 2.5 mg via ORAL
  Filled 2023-04-08 (×4): qty 1

## 2023-04-08 MED ORDER — ACETAMINOPHEN 325 MG PO TABS
650.0000 mg | ORAL_TABLET | Freq: Four times a day (QID) | ORAL | Status: DC | PRN
  Administered 2023-04-08 – 2023-04-11 (×5): 650 mg via ORAL
  Filled 2023-04-08 (×5): qty 2

## 2023-04-08 MED ORDER — LIDOCAINE 5 % EX PTCH
1.0000 | MEDICATED_PATCH | CUTANEOUS | Status: DC
  Administered 2023-04-08 – 2023-04-11 (×4): 1 via TRANSDERMAL
  Filled 2023-04-08 (×4): qty 1

## 2023-04-08 MED ORDER — CYCLOBENZAPRINE HCL 10 MG PO TABS
10.0000 mg | ORAL_TABLET | Freq: Two times a day (BID) | ORAL | Status: DC | PRN
  Administered 2023-04-08 – 2023-04-11 (×7): 10 mg via ORAL
  Filled 2023-04-08 (×7): qty 1

## 2023-04-08 MED ORDER — ACETAMINOPHEN 325 MG PO TABS
650.0000 mg | ORAL_TABLET | Freq: Once | ORAL | Status: AC
  Administered 2023-04-08: 650 mg via ORAL
  Filled 2023-04-08: qty 2

## 2023-04-08 MED ORDER — FENTANYL CITRATE PF 50 MCG/ML IJ SOSY
25.0000 ug | PREFILLED_SYRINGE | INTRAMUSCULAR | Status: AC | PRN
  Administered 2023-04-10 (×2): 25 ug via INTRAVENOUS
  Filled 2023-04-08 (×2): qty 1

## 2023-04-08 MED ORDER — ONDANSETRON HCL 4 MG PO TABS: 4.0000 mg | ORAL_TABLET | Freq: Four times a day (QID) | ORAL | Status: DC | PRN

## 2023-04-08 MED ORDER — URSODIOL 300 MG PO CAPS
300.0000 mg | ORAL_CAPSULE | Freq: Two times a day (BID) | ORAL | Status: DC
  Administered 2023-04-08 – 2023-04-12 (×8): 300 mg via ORAL
  Filled 2023-04-08 (×8): qty 1

## 2023-04-08 MED ORDER — OXYCODONE HCL 5 MG PO TABS
5.0000 mg | ORAL_TABLET | ORAL | Status: DC | PRN
  Administered 2023-04-08 – 2023-04-12 (×19): 5 mg via ORAL
  Filled 2023-04-08 (×18): qty 1

## 2023-04-08 MED ORDER — ONDANSETRON HCL 4 MG/2ML IJ SOLN: 4.0000 mg | Freq: Four times a day (QID) | INTRAMUSCULAR | Status: DC | PRN

## 2023-04-08 MED ORDER — METHYLPREDNISOLONE SODIUM SUCC 1000 MG IJ SOLR
1000.0000 mg | INTRAMUSCULAR | Status: DC
  Administered 2023-04-08 – 2023-04-10 (×3): 1000 mg via INTRAVENOUS
  Filled 2023-04-08 (×3): qty 16

## 2023-04-08 MED ORDER — SODIUM CHLORIDE 0.9 % IV SOLN
INTRAVENOUS | Status: AC | PRN
Start: 2023-04-08 — End: 2023-04-09

## 2023-04-08 MED ORDER — MAGNESIUM SULFATE 2 GM/50ML IV SOLN
2.0000 g | Freq: Once | INTRAVENOUS | Status: AC
  Administered 2023-04-08: 2 g via INTRAVENOUS
  Filled 2023-04-08: qty 50

## 2023-04-08 NOTE — Consult Note (Signed)
 NEUROLOGY CONSULT NOTE   Date of service: April 08, 2023 Patient Name: Gabriela Campbell MRN:  [id number] DOB:  Dec 26, 1965 Chief Complaint: "Leg weakness" Requesting Provider: Bobette Mo, MD  History of Present Illness  Gabriela Campbell is a 58 y.o. female with hx of multiple sclerosis diagnosed based on imaging, positive oligoclonal bands.  Initial symptoms were in 2018, MRI cervical spine showed enhancement at C3/4, and MRI brain showed multiple white matter lesions with T1 holes.  CSF testing showed more than five oligoclonal bands.  She was on Tysabri for several years, but this was stopped due to elevated liver function testing.   She was evaluated for this in February with weakness and was found to have LFT elevations.  With MRI of her brain and cervical spine with unchanged lesions.  At the time, she had fairly extensive testing including B12, CK, ANA, mitochondrial antibodies, anti-smooth muscle antibodies.  Biopsy revealed chronic inflammation.  She is being managed on ursodiol.  Her LFTs look better today.  Over the past few months she has had steady, progressive worsening of her lower extremity weakness.  She has gotten to the point where she cannot walk well.   Past History   Past Medical History:  Diagnosis Date   Allergy    Multiple sclerosis (HCC)    Shingles    Weakness     Past Surgical History:  Procedure Laterality Date   ABDOMINAL HYSTERECTOMY     CHOLECYSTECTOMY      Family History: Family History  Problem Relation Age of Onset   Cancer Paternal Grandmother    Cancer Paternal Aunt    Cancer Paternal Aunt    Colon cancer Neg Hx    Stomach cancer Neg Hx    Esophageal cancer Neg Hx    Pancreatic cancer Neg Hx     Social History  reports that she has been smoking cigarettes. She has never used smokeless tobacco. She reports that she does not drink alcohol and does not use drugs.  Allergies  Allergen Reactions   Dilaudid [Hydromorphone Hcl]  Anaphylaxis   Fish Allergy Anaphylaxis   Ibuprofen Anaphylaxis   Iodine Anaphylaxis   Penicillins Anaphylaxis   Shellfish Allergy Anaphylaxis   Shrimp Extract Anaphylaxis   Tramadol Nausea And Vomiting and Anaphylaxis   Baclofen Other (See Comments)    Makes the patient feel wobbly   Gabapentin Other (See Comments)    GI upset   Codeine Nausea Only    Medications   Current Facility-Administered Medications:    0.9 %  sodium chloride infusion, , Intravenous, PRN, Curatolo, Adam, DO   acetaminophen (TYLENOL) tablet 650 mg, 650 mg, Oral, Q6H PRN **OR** acetaminophen (TYLENOL) suppository 650 mg, 650 mg, Rectal, Q6H PRN, Bobette Mo, MD   [START ON 04/09/2023] amLODipine (NORVASC) tablet 2.5 mg, 2.5 mg, Oral, Daily, Bobette Mo, MD   cyclobenzaprine (FLEXERIL) tablet 10 mg, 10 mg, Oral, BID PRN, Bobette Mo, MD   enoxaparin (LOVENOX) injection 40 mg, 40 mg, Subcutaneous, Q24H, Bobette Mo, MD   fentaNYL (SUBLIMAZE) injection 25 mcg, 25 mcg, Intravenous, Q2H PRN, Bobette Mo, MD   lidocaine (LIDODERM) 5 % 1 patch, 1 patch, Transdermal, Q24H, Curatolo, Adam, DO, 1 patch at 04/08/23 1608   magnesium sulfate IVPB 2 g 50 mL, 2 g, Intravenous, Once, Bobette Mo, MD, Last Rate: 50 mL/hr at 04/08/23 1816, Infusion Verify at 04/08/23 1816   methylPREDNISolone sodium succinate (SOLU-MEDROL) 1,000 mg in sodium chloride 0.9 %  50 mL IVPB, 1,000 mg, Intravenous, Q24H, Samba Cumba, Hardin Negus, MD   ondansetron [facility]) tablet 4 mg, 4 mg, Oral, Q6H PRN **OR** ondansetron (ZOFRAN) injection 4 mg, 4 mg, Intravenous, Q6H PRN, Bobette Mo, MD   oxyCODONE (Oxy IR/ROXICODONE) immediate release tablet 5 mg, 5 mg, Oral, Q4H PRN, Bobette Mo, MD   ursodiol (ACTIGALL) capsule 300 mg, 300 mg, Oral, BID, Bobette Mo, MD  Vitals   Vitals:   April 24, 2023 1530 24-Apr-2023 1600 04/24/23 1700 April 24, 2023 1725  BP: (!) 146/90 (!) 123/102 (!) 137/95 (!) 144/90   Pulse: 83 (!) 108 91 88  Resp: (!) 25 (!) 25 20 20   Temp:    98.7 F (37.1 C)  TempSrc:    Oral  SpO2: 99% 97% 96% 100%    There is no height or weight on file to calculate BMI.  Physical Exam   Constitutional: Appears well-developed and well-nourished.  Neurologic Examination    Neuro: Mental Status: Patient is awake, alert, oriented to person, place, month, year, and situation. Patient is able to give a clear and coherent history. No signs of aphasia or neglect Cranial Nerves: II: Visual Fields are full. Pupils are equal, round, and reactive to light.   III,IV, VI: EOMI without ptosis or diploplia.  V: Facial sensation is symmetric to temperature VII: Facial movement with?  Mild right facial weakness Motor: She has bilateral lower extremity weakness 4/5, though slightly worse on the right than left.  She also has 4/5 weakness of the right arm and 4+/5 in the left arm. Sensory: Sensation is diminished on the right  Cerebellar: FNF appears ataxic on the left > right        Labs/Imaging/Neurodiagnostic studies   CBC:  Recent Labs  Lab April 24, 2023 1213  WBC 10.0  NEUTROABS 6.9  HGB 16.1*  HCT 48.5*  MCV 96.8  PLT 187   Basic Metabolic Panel:  Lab Results  Component Value Date   NA 140 04/24/2023   K 2.9 (L) 04-24-23   CO2 29 04-24-2023   GLUCOSE 92 2023-04-24   BUN 13 04-24-2023   CREATININE 0.73 04-24-23   CALCIUM 9.5 04-24-23   GFRNONAA >60 2023/04/24   GFRAA 85 11/24/2019   Lipid Panel: No results found for: "LDLCALC" HgbA1c:  Lab Results  Component Value Date   HGBA1C 5.9 (H) 07/29/2020   Urine Drug Screen:     Component Value Date/Time   LABOPIA POSITIVE (A) 07/28/2022 1615   COCAINSCRNUR NONE DETECTED 07/28/2022 1615   LABBENZ POSITIVE (A) 07/28/2022 1615   AMPHETMU NONE DETECTED 07/28/2022 1615   THCU NONE DETECTED 07/28/2022 1615   LABBARB NONE DETECTED 07/28/2022 1615    Alcohol Level No results found for: "ETH" INR  Lab  Results  Component Value Date   INR 0.9 04/24/23    ASSESSMENT   Gabriela Campbell is a 58 y.o. female with a history of multiple sclerosis who presents with worsening lower extremity weakness.  She did have imaging for similar symptoms 2 months ago which was negative, but with continued worsening of her symptoms, and the subacute timeframe, I do think I would favor steroids.  She is claustrophobic, and given that I think I would attempt steroids even if negative I am not sure that we need to repeat this at this time.  RECOMMENDATIONS  Solu-Medrol 1 g daily for 5 days PT/OT Neurology will follow intermittently ______________________________________________________________________    Signed, Ritta Slot, MD [facility]

## 2023-04-08 NOTE — H&P (Signed)
 History and Physical    [name] ZOX:[id number] DOB: [birth year] DOA: 04/08/2023 DOS: the patient was seen and examined on 04/08/2023 PCP: [facility]  Patient coming from: Home  Chief Complaint:  Chief Complaint  Patient presents with   Fall   HPI: Gabriela Campbell is a 58 y.o. female with medical history significant of seasonal allergies, herpes zoster, generalized weakness, polycythemia, vitamin D deficiency, history of tobacco use, history of elevated LFTs, multiple sclerosis who presented to the emergency department with complaints of having multiple falls in the last few days.  She has similar symptoms in February but had benign imaging and was not admitted.  However, given current symptomatology, neurology has recommended high-dose methylprednisolone.  She denied fever, chills, rhinorrhea, sore throat, wheezing or hemoptysis.  No chest pain, palpitations, diaphoresis, PND, orthopnea or pitting edema of the lower extremities.  No abdominal pain, nausea, emesis, diarrhea, constipation, melena or hematochezia.  No flank pain, dysuria, frequency or hematuria.  No polyuria, polydipsia, polyphagia or blurred vision.   Lab work: Urinalysis was cloudy with protein of 100 mg/dL.  CBC showed white count 10.0, hemoglobin 16.1 g/dL platelets 811.  Normal PT, INR and ammonia level.  Magnesium was 1.9 and phosphorus 2.9 mg deciliter.  CMP showed a potassium of 2.9 mmol/L, total protein 8.3 g/dL and alkaline phosphatase of 257 mg/dL.  The rest of the CMP measurements were normal.  Imaging: Portable 1 view chest radiograph with no acute findings in the chest.  Portable pelvis no acute ultrasound normality.  CT head without contrast no acute intercranial normality.  CT cervical spine no acute fracture or traumatic malalignment of the C-spine.  CT lumbar spine no fracture or misalignment due to trauma.  ED course: Initial vital signs were temperature 98.6 F, pulse 89,  respirations 16, BP 146/95 mmHg and O2 sat 99% on room air.  The patient received oral/parenteral potassium replacement, fentanyl 50 mcg IVP and acetaminophen 650 mg p.o. x 1.   Review of Systems: As mentioned in the history of present illness. All other systems reviewed and are negative.  Past Medical History:  Diagnosis Date   Allergy    Multiple sclerosis (HCC)    Shingles    Weakness    Past Surgical History:  Procedure Laterality Date   ABDOMINAL HYSTERECTOMY     CHOLECYSTECTOMY     Social History:  reports that she has been smoking cigarettes. She has never used smokeless tobacco. She reports that she does not drink alcohol and does not use drugs.  Allergies  Allergen Reactions   Dilaudid [Hydromorphone Hcl] Anaphylaxis   Fish-Derived Products Anaphylaxis   Ibuprofen Anaphylaxis   Iodine Anaphylaxis   Penicillins Anaphylaxis    Has patient had a PCN reaction causing immediate rash, facial/tongue/throat swelling, SOB or lightheadedness with hypotension:yes Has patient had a PCN reaction causing severe rash involving mucus membranes or skin necrosis: no Has patient had a PCN reaction that required hospitalization no Has patient had a PCN reaction occurring within the last 10 years: no If all of the above answers are "NO", then may proceed with Cephalosporin use.    Shellfish-Derived Products Anaphylaxis   Shrimp Extract Anaphylaxis   Baclofen Other (See Comments)    Makes the patient feel wobbly   Gabapentin Other (See Comments)    GI upset   Codeine Nausea Only   Tramadol Nausea And Vomiting    Family History  Problem Relation Age of Onset   Cancer Paternal Grandmother  Cancer Paternal Aunt    Cancer Paternal Aunt    Colon cancer Neg Hx    Stomach cancer Neg Hx    Esophageal cancer Neg Hx    Pancreatic cancer Neg Hx     Prior to Admission medications   Medication Sig Start Date End Date Taking? Authorizing Provider  amLODipine (NORVASC) 5 MG tablet Take  0.5 tablets (2.5 mg total) by mouth daily. 02/14/23   Carollee Herter, DO  Cholecalciferol (VITAMIN D3) 25 MCG (1000 UT) CAPS Take 2,000 Units by mouth daily.    [provider]  cyclobenzaprine (FLEXERIL) 10 MG tablet Take 1 tablet (10 mg total) by mouth 2 (two) times daily as needed for muscle spasms. 10/03/22   Gwyneth Sprout, MD  oxyCODONE 10 MG TABS Take 0.5 tablets (5 mg total) by mouth every 4 (four) hours as needed for up to 7 days for severe pain (pain score 7-10) or moderate pain (pain score 4-6). 02/16/23 02/23/23  Carollee Herter, DO  thiamine (VITAMIN B-1) 100 MG tablet Take 100 mg by mouth daily.    [provider]  ursodiol (ACTIGALL) 300 MG capsule Take by mouth. Take 1 capsule (300 mg total) by mouth daily for 7 days, THEN 1 capsule (300 mg total) 2 (two) times a day. 02/27/23 03/05/24  [provider]    Physical Exam: Vitals:   04/08/23 1430 04/08/23 1455 04/08/23 1530 04/08/23 1600  BP: (!) 133/99 (!) 149/98 (!) 146/90 (!) 123/102  Pulse: 85 84 83 (!) 108  Resp: (!) 21 (!) 25 (!) 25 (!) 25  Temp: 98.6 F (37 C)     SpO2: 97% 100% 99% 97%   Physical Exam Vitals and nursing note reviewed.  Constitutional:      General: She is awake.     Appearance: Normal appearance. She is ill-appearing.  HENT:     Head: Normocephalic.     Nose: No rhinorrhea.     Mouth/Throat:     Mouth: Mucous membranes are moist.  Eyes:     General: No scleral icterus.    Pupils: Pupils are equal, round, and reactive to light.  Neck:     Vascular: No JVD.  Cardiovascular:     Rate and Rhythm: Normal rate and regular rhythm.     Heart sounds: S1 normal and S2 normal.  Pulmonary:     Effort: Pulmonary effort is normal.     Breath sounds: Normal breath sounds. No wheezing or rales.  Abdominal:     General: Bowel sounds are normal. There is no distension.     Palpations: Abdomen is soft.     Tenderness: There is no abdominal tenderness. There is no right CVA tenderness, left  CVA tenderness or guarding.  Musculoskeletal:     Cervical back: Neck supple.     Right lower leg: No edema.     Left lower leg: No edema.  Skin:    General: Skin is warm and dry.  Neurological:     Mental Status: She is alert and oriented to person, place, and time.     Motor: Weakness present.  Psychiatric:        Mood and Affect: Mood normal.        Behavior: Behavior normal. Behavior is cooperative.     Data Reviewed:  Results are pending, will review when available.  EKG: Vent. rate 78 BPM PR interval 194 ms QRS duration 111 ms QT/QTcB 417/475 ms P-R-T axes 62 84 50 Sinus rhythm  Assessment  and Plan: Principal Problem:   Multiple sclerosis exacerbation (HCC)   Relapsing remitting multiple sclerosis (HCC) Inpatient./telemetry Continue supplemental oxygen. Neurology consult appreciated. -Started on methylprednisolone 1000 mg mg IVP daily. Continue oxycodone 5 mg every 4 hours as needed. Continue cyclobenzaprine 10 mg p.o. 3 times daily as needed. Follow-up CBC and chemistry in the morning.  Consult physical therapy in the a.m.  Active Problems:   Essential hypertension Continue amlodipine 2.5 mg p.o. daily.    Elevated liver enzymes Follow-up LFTs.    Polycythemia Secondary to:   Tobacco use Tobacco cessation. If needed, nicotine replacement therapy may be ordered. Monitor hematocrit and hemoglobin.    Advance Care Planning:   Code Status: Limited: Do not attempt resuscitation (DNR) -DNR-LIMITED -Do Not Intubate/DNI    Consults: Neurohospitalist team.  Family Communication:   Severity of Illness: The appropriate patient status for this patient is INPATIENT. Inpatient status is judged to be reasonable and necessary in order to provide the required intensity of service to ensure the patient's safety. The patient's presenting symptoms, physical exam findings, and initial radiographic and laboratory data in the context of their chronic comorbidities is  felt to place them at high risk for further clinical deterioration. Furthermore, it is not anticipated that the patient will be medically stable for discharge from the hospital within 2 midnights of admission.   * I certify that at the point of admission it is my clinical judgment that the patient will require inpatient hospital care spanning beyond 2 midnights from the point of admission due to high intensity of service, high risk for further deterioration and high frequency of surveillance required.*  Author: Bobette Mo, MD 04/08/2023 5:04 PM  For on call review [email or web address].   This document was prepared using Dragon voice recognition software and may contain some unintended transcription errors.

## 2023-04-08 NOTE — ED Provider Notes (Signed)
 [facility] AT [facility] Provider Note   CSN: [id number] Arrival date & time: 04/08/23  1131     History  Chief Complaint  Patient presents with   Gabriela Campbell    Gabriela Campbell is a 58 y.o. female.  Patient here for low back pain after multiple falls.  History of MS, history of liver dysfunction may be hemochromatosis.  She is not currently on any medications for her MS due to her recent liver issues.  She is following with GI.  She has been mostly using a walker for the last several months due to decreased strength in her legs.  She has noticed maybe the last couple days weakness in her legs is a little bit more than normal.  She is recently admitted for the same had MRI of her brain cervical thoracic and lumbar spine with no new evidence of MS or active lesions.  Plan is to get control of the liver issues right now before they reengage doing MS treatments.  Ultimately she lives with her family member.  She is having mostly pain in her right lower back from her falls.  She does not think she hit her head or lost consciousness with these falls but it is getting harder for her to do transfers and get around.  She does not use a wheelchair at home.  The history is provided by the patient.       Home Medications Prior to Admission medications   Medication Sig Start Date End Date Taking? Authorizing Provider  amLODipine (NORVASC) 5 MG tablet Take 0.5 tablets (2.5 mg total) by mouth daily. 02/14/23   Carollee Herter, DO  Cholecalciferol (VITAMIN D3) 25 MCG (1000 UT) CAPS Take 2,000 Units by mouth daily.    [provider]  cyclobenzaprine (FLEXERIL) 10 MG tablet Take 1 tablet (10 mg total) by mouth 2 (two) times daily as needed for muscle spasms. 10/03/22   Gwyneth Sprout, MD  oxyCODONE 10 MG TABS Take 0.5 tablets (5 mg total) by mouth every 4 (four) hours as needed for up to 7 days for severe pain (pain score 7-10) or moderate pain (pain score 4-6). 02/16/23  02/23/23  Carollee Herter, DO  thiamine (VITAMIN B-1) 100 MG tablet Take 100 mg by mouth daily.    [provider]  ursodiol (ACTIGALL) 300 MG capsule Take by mouth. Take 1 capsule (300 mg total) by mouth daily for 7 days, THEN 1 capsule (300 mg total) 2 (two) times a day. 02/27/23 03/05/24  [provider]      Allergies    Dilaudid [hydromorphone hcl], Fish-derived products, Ibuprofen, Iodine, Penicillins, Shellfish-derived products, Shrimp extract, Baclofen, Gabapentin, Codeine, and Tramadol    Review of Systems   Review of Systems  Physical Exam Updated Vital Signs BP (!) 143/86   Pulse 78   Temp 98.6 F (37 C)   Resp 14   SpO2 100%  Physical Exam Vitals and nursing note reviewed.  Constitutional:      General: She is not in acute distress.    Appearance: She is well-developed.  HENT:     Head: Normocephalic and atraumatic.     Nose: Nose normal.     Mouth/Throat:     Mouth: Mucous membranes are moist.  Eyes:     Extraocular Movements: Extraocular movements intact.     Conjunctiva/sclera: Conjunctivae normal.     Pupils: Pupils are equal, round, and reactive to light.  Cardiovascular:     Rate  and Rhythm: Normal rate and regular rhythm.     Pulses: Normal pulses.     Heart sounds: Normal heart sounds. No murmur heard. Pulmonary:     Effort: Pulmonary effort is normal. No respiratory distress.     Breath sounds: Normal breath sounds.  Abdominal:     Palpations: Abdomen is soft.     Tenderness: There is no abdominal tenderness.  Musculoskeletal:        General: Tenderness present. No swelling. Normal range of motion.     Cervical back: Normal range of motion and neck supple. No tenderness.     Comments: Tenderness to the midline spine in the lumbar region and paraspinal region to the right  Skin:    General: Skin is warm and dry.     Capillary Refill: Capillary refill takes less than 2 seconds.  Neurological:     Mental Status: She is alert.      Comments: Patient globally weak in both lower legs very difficult for her to lift her legs up off the bed otherwise she has got normal strength and sensation throughout, normal speech normal visual fields, normal cranial nerves  Psychiatric:        Mood and Affect: Mood normal.     ED Results / Procedures / Treatments   Labs (all labs ordered are listed, but only abnormal results are displayed) Labs Reviewed  CBC WITH DIFFERENTIAL/PLATELET - Abnormal; Notable for the following components:      Result Value   Hemoglobin 16.1 (*)    HCT 48.5 (*)    All other components within normal limits  COMPREHENSIVE METABOLIC PANEL WITH GFR - Abnormal; Notable for the following components:   Potassium 2.9 (*)    Total Protein 8.3 (*)    Alkaline Phosphatase 257 (*)    All other components within normal limits  PROTIME-INR  AMMONIA  MAGNESIUM  URINALYSIS, ROUTINE W REFLEX MICROSCOPIC    EKG EKG Interpretation Date/Time:  Monday April 08 2023 13:21:45 EDT Ventricular Rate:  78 PR Interval:  194 QRS Duration:  111 QT Interval:  417 QTC Calculation: 475 R Axis:   84  Text Interpretation: Sinus rhythm Confirmed by Virgina Norfolk [phone]) on 04/08/2023 1:24:27 PM  Radiology DG Pelvis Portable Result Date: 04/08/2023 CLINICAL DATA:  Pain.  Recent history of falls. EXAM: PORTABLE PELVIS 1-2 VIEWS COMPARISON:  Pelvic radiograph dated 04/07/2022. FINDINGS: There is no evidence of pelvic fracture or diastasis. Femoral heads are seated within the acetabula. Sacroiliac joints and pubic symphysis are anatomically aligned. IMPRESSION: No acute osseous abnormality. Electronically Signed   By: Hart Robinsons M.D.   On: 04/08/2023 12:50   DG Chest Portable 1 View Result Date: 04/08/2023 CLINICAL DATA:  Pain.  Recent history of falls. EXAM: PORTABLE CHEST 1 VIEW COMPARISON:  Chest radiograph dated 02/14/2023. FINDINGS: The heart size and mediastinal contours are within normal limits. Similar biapical  pleuroparenchymal scarring. No focal consolidation, pleural effusion, or pneumothorax. No acute osseous abnormality. IMPRESSION: No acute findings in the chest. Electronically Signed   By: Hart Robinsons M.D.   On: 04/08/2023 12:49    Procedures Procedures    Medications Ordered in ED Medications  potassium chloride 10 mEq in 100 mL IVPB (10 mEq Intravenous New Bag/Given 04/08/23 1358)  0.9 %  sodium chloride infusion (has no administration in time range)  fentaNYL (SUBLIMAZE) injection 50 mcg (50 mcg Intravenous Given 04/08/23 1213)  potassium chloride SA (KLOR-CON M) CR tablet 40 mEq (40 mEq Oral Given 04/08/23  1356)    ED Course/ Medical Decision Making/ A&P                                 Medical Decision Making Amount and/or Complexity of Data Reviewed Labs: ordered. Radiology: ordered.  Risk Prescription drug management.   QUINTANA CANELO is here with fall weakness.  History of MS history of hemochromatosis.  Unremarkable vitals here.  No fever.  Tenderness mostly to the right lower back.  Is not on any blood thinners.  She has had ongoing issues with weakness in her legs worse now over the last few days.  She is currently dealing with liver issues and supposedly hemochromatosis.  She is having a hard time getting around with her walker and having multiple falls now.  Neurologically she is intact except for pretty significant weakness in both lower legs is very difficult her to lift both legs off the bed.  Of note she did have MRI of her brain cervical thoracic and lumbar spine little under 2 months ago when she had similar presentation that showed no active MS lesions.  But she had extensive liver workup as well.  She is following with [facility].  Does not have any abdominal pain currently.  She has taken some new medications for this now here the last few weeks that she does not remember the name of.  Her neurologist has held any MS therapy at this time until liver treatment can  get more under control she states.  Ultimately differential diagnosis likely falls likely in the setting of decompensation of generalized weakness in her legs either from MS or just deconditioning, she is got most of her tenderness in her right hip right lower back will evaluate for traumatic processes but ultimately I have low suspicion for stroke and not quite sure if this is may be an MS flare or not but I am not sure if she is very safe just trying to get around with a walker and may need admission for PT OT and placement/wheelchair.  Will get basic labs will get trauma images will reach out to neurology anticipate admission to medicine.  Thus far lab work for my review and interpretation shows potassium 2.9.  Will order repletion.  Liver enzymes appear improved from prior with AST of 16 ALT of 15 and alk phos of 257.  No significant leukocytosis or anemia otherwise.  Chest x-ray pelvic x-ray are unremarkable from traumatic processes or other acute processes.  Awaiting CT scan of head neck and low back.  I have talked with Dr. Amada Jupiter with neurology.  I have talked with him that I will be admitting for her weakness in her legs and concern for may be MS flare or maybe some physical decompensation.  He will evaluate the patient and decide if we should empirically treat with high-dose steroids or maybe consider reimaging.  At this time patient handed off to oncoming ED staff with patient pending CT images and admission.  This chart was dictated using voice recognition software.  Despite best efforts to proofread,  errors can occur which can change the documentation meaning.         Final Clinical Impression(s) / ED Diagnoses Final diagnoses:  Weakness  Weakness of both lower extremities    Rx / DC Orders ED Discharge Orders     None         Virgina Norfolk, DO 04/08/23 1431

## 2023-04-08 NOTE — ED Triage Notes (Signed)
 EMS reports from home, recurrent falls x last two weeks, 4 within last three days, and slip and fall today. No LOC, obvious injuries, or blood thinners. Pt c/o leg weakness, right sided generalized and back pain  BP 140/92 HR 97 RR 19 Sp02 97 RA.

## 2023-04-08 NOTE — ED Provider Notes (Signed)
  Physical Exam  BP (!) 123/102   Pulse (!) 108   Temp 98.6 F (37 C)   Resp (!) 25   SpO2 97%   Physical Exam  Procedures  Procedures  ED Course / MDM    Medical Decision Making Amount and/or Complexity of Data Reviewed Labs: ordered. Radiology: ordered.  Risk OTC drugs. Prescription drug management. Decision regarding hospitalization.   Presumed signout.  Weakness.  Potentially traumatic injury but thought more likely to be an MS.  Seen by neurology and they will treat empirically for MS exacerbation.  Will require mission the hospital.  CT scans reassuring.  Will discuss with hospitalist.       Benjiman Core, MD 04/08/23 [phone]

## 2023-04-08 NOTE — Plan of Care (Signed)

## 2023-04-09 DIAGNOSIS — G35 Multiple sclerosis: Secondary | ICD-10-CM | POA: Diagnosis not present

## 2023-04-09 LAB — CBC
HCT: 43.9 % (ref 36.0–46.0)
Hemoglobin: 14.4 g/dL (ref 12.0–15.0)
MCH: 32.4 pg (ref 26.0–34.0)
MCHC: 32.8 g/dL (ref 30.0–36.0)
MCV: 98.9 fL (ref 80.0–100.0)
Platelets: 165 10*3/uL (ref 150–400)
RBC: 4.44 MIL/uL (ref 3.87–5.11)
RDW: 12.9 % (ref 11.5–15.5)
WBC: 9.9 10*3/uL (ref 4.0–10.5)
nRBC: 0 % (ref 0.0–0.2)

## 2023-04-09 LAB — COMPREHENSIVE METABOLIC PANEL WITH GFR
ALT: 12 U/L (ref 0–44)
AST: 14 U/L — ABNORMAL LOW (ref 15–41)
Albumin: 3.4 g/dL — ABNORMAL LOW (ref 3.5–5.0)
Alkaline Phosphatase: 217 U/L — ABNORMAL HIGH (ref 38–126)
Anion gap: 8 (ref 5–15)
BUN: 20 mg/dL (ref 6–20)
CO2: 20 mmol/L — ABNORMAL LOW (ref 22–32)
Calcium: 9 mg/dL (ref 8.9–10.3)
Chloride: 108 mmol/L (ref 98–111)
Creatinine, Ser: 0.72 mg/dL (ref 0.44–1.00)
GFR, Estimated: >60 mL/min (ref >60)
Glucose, Bld: 174 mg/dL — ABNORMAL HIGH (ref 70–99)
Potassium: 4.6 mmol/L (ref 3.5–5.1)
Sodium: 136 mmol/L (ref 135–145)
Total Bilirubin: 0.5 mg/dL (ref 0.0–1.2)
Total Protein: 6.8 g/dL (ref 6.5–8.1)

## 2023-04-09 LAB — GLUCOSE, CAPILLARY: Glucose-Capillary: 226 mg/dL — ABNORMAL HIGH (ref 70–99)

## 2023-04-09 MED ORDER — INSULIN ASPART 100 UNIT/ML IJ SOLN
0.0000 [IU] | Freq: Three times a day (TID) | INTRAMUSCULAR | Status: DC
  Administered 2023-04-10: 2 [IU] via SUBCUTANEOUS
  Administered 2023-04-10: 1 [IU] via SUBCUTANEOUS
  Administered 2023-04-10: 5 [IU] via SUBCUTANEOUS
  Administered 2023-04-11 (×2): 1 [IU] via SUBCUTANEOUS
  Administered 2023-04-11: 7 [IU] via SUBCUTANEOUS
  Administered 2023-04-12 (×2): 2 [IU] via SUBCUTANEOUS

## 2023-04-09 MED ORDER — PANTOPRAZOLE SODIUM 40 MG PO TBEC
40.0000 mg | DELAYED_RELEASE_TABLET | Freq: Every day | ORAL | Status: DC
  Administered 2023-04-09 – 2023-04-12 (×4): 40 mg via ORAL
  Filled 2023-04-09 (×4): qty 1

## 2023-04-09 MED ORDER — CHLORHEXIDINE GLUCONATE CLOTH 2 % EX PADS
6.0000 | MEDICATED_PAD | Freq: Every day | CUTANEOUS | Status: DC
  Administered 2023-04-10 – 2023-04-12 (×3): 6 via TOPICAL

## 2023-04-09 MED ORDER — SODIUM CHLORIDE 0.9% FLUSH
10.0000 mL | Freq: Two times a day (BID) | INTRAVENOUS | Status: DC
  Administered 2023-04-09 – 2023-04-12 (×6): 10 mL

## 2023-04-09 NOTE — Hospital Course (Signed)
 HPI: Gabriela Campbell is a 58 y.o. female with medical history significant of seasonal allergies, herpes zoster, generalized weakness, polycythemia, vitamin D deficiency, history of tobacco use, history of elevated LFTs, multiple sclerosis who presented to the emergency department with complaints of having multiple falls in the last few days.  She has similar symptoms in February but had benign imaging and was not admitted.  However, given current symptomatology, neurology has recommended high-dose methylprednisolone.  She denied fever, chills, rhinorrhea, sore throat, wheezing or hemoptysis.  No chest pain, palpitations, diaphoresis, PND, orthopnea or pitting edema of the lower extremities.  No abdominal pain, nausea, emesis, diarrhea, constipation, melena or hematochezia.  No flank pain, dysuria, frequency or hematuria.  No polyuria, polydipsia, polyphagia or blurred vision.    ED course: Initial vital signs were temperature 98.6 F, pulse 89, respirations 16, BP 146/95 mmHg and O2 sat 99% on room air.  The patient received oral/parenteral potassium replacement, fentanyl 50 mcg IVP and acetaminophen 650 mg p.o. x 1.  Significant Events: Admitted 04/08/2023 for MS flare   Significant Labs: WBC 10, HgB 16.1, plt 187 Na 140, K 2.9, CO2 of 29, BUN 13, Scr 0.73, glu 92  Significant Imaging Studies: CXR No acute findings in the chest.  Pelvic XR No acute osseous abnormality  CT head, CT c-spine No CT evidence of acute intracranial abnormality. No acute fracture or traumatic malalignment of the cervical spine. Soft tissue along the lateral wall of the extra thoracic trachea which may reflect respiratory secretions. Recommend correlation for history of aspiration CT L-spine No acute fracture or traumatic malalignment of the lumbar spine   Antibiotic Therapy: Anti-infectives (From admission, onward)    None       Procedures:   Consultants: Neurology

## 2023-04-09 NOTE — Evaluation (Signed)
 Physical Therapy Evaluation Patient Details Name: Gabriela Campbell HENSHAW MRN: [id number] DOB: July 10, 1965 Today's Date: 04/09/2023  History of Present Illness  Gabriela Campbell is a 58 y.o. female presents with low back pain after multiple falls. PMH: MS, shingles  Clinical Impression  Pt admitted with above diagnosis. Pt from home with father who is able to assist if needed, pt reports ind with self care, using rollator for in home ambulation, pt able to fix simple meals/microwave meals, does endorse falls and states able to get up ind with increased time. On eval, pt with tremulous LE Muscle activation, reports 9/10 pain at rest and 9.5/10 pain with ambulation. Pt with unsteady gait, tremulous, recliner follow for safety, amb 16 ft with RW and min A. Recommend HHPT and RW with family support at home. Will continue to assess for w/c; on eval pt reports w/c won't fit in her home and she is not interested. Pt currently with functional limitations due to the deficits listed below (see PT Problem List). Pt will benefit from acute skilled PT to increase their independence and safety with mobility to allow discharge.           If plan is discharge home, recommend the following: A little help with walking and/or transfers;A little help with bathing/dressing/bathroom;Assistance with cooking/housework;Assist for transportation;Help with stairs or ramp for entrance   Can travel by private vehicle        Equipment Recommendations Rolling walker (2 wheels)  Recommendations for Other Services       Functional Status Assessment Patient has had a recent decline in their functional status and demonstrates the ability to make significant improvements in function in a reasonable and predictable amount of time.     Precautions / Restrictions Precautions Precautions: Fall Restrictions Weight Bearing Restrictions Per Provider Order: No      Mobility  Bed Mobility               General bed mobility  comments: in recliner upon arrival    Transfers Overall transfer level: Needs assistance Equipment used: Rolling walker (2 wheels) Transfers: Sit to/from Stand Sit to Stand: Contact guard assist           General transfer comment: slow, tremulous movement, BUE assisting to power up, BLE braced against front of recliner    Ambulation/Gait Ambulation/Gait assistance: Min assist Gait Distance (Feet): 16 Feet Assistive device: Rolling walker (2 wheels) Gait Pattern/deviations: Step-to pattern, Decreased stride length, Trunk flexed, Narrow base of support Gait velocity: decreased     General Gait Details: slow, step to gait pattern, generally unsteady, shulffing step progression unless cued, increased effort to lift leg to take step (R > L), tremulous movements, recliner follor for safety  Stairs            Wheelchair Mobility     Tilt Bed    Modified Rankin (Stroke Patients Only)       Balance Overall balance assessment: Needs assistance, History of Falls         Standing balance support: Reliant on assistive device for balance, During functional activity, Bilateral upper extremity supported Standing balance-Leahy Scale: Poor                               Pertinent Vitals/Pain Pain Assessment Pain Assessment: 0-10 Pain Score: 9  Pain Location: low back, front of both legs (touches thighs) Pain Descriptors / Indicators: Tightness, Guarding, Discomfort Pain Intervention(s): Limited activity  within patient's tolerance, Monitored during session, Premedicated before session, Repositioned    Home Living Family/patient expects to be discharged to:: Private residence Living Arrangements: Parent Available Help at Discharge: Available 24 hours/day;Family Type of Home: House Home Access: Stairs to enter Entrance Stairs-Rails: None Entrance Stairs-Number of Steps: 4   Home Layout: One level Home Equipment: Shower seat;Cane - single point;Rollator (4  wheels)      Prior Function Prior Level of Function : Independent/Modified Independent;History of Falls (last six months)             Mobility Comments: pt reports household ambulator with rollator, multiple falls in last 6 months due to BLE give out but able to get up ind ADLs Comments: pt reports ind with self care, fixes microwave meals and simple household chores; dad performs household chores     Extremity/Trunk Assessment   Upper Extremity Assessment Upper Extremity Assessment: Defer to OT evaluation    Lower Extremity Assessment Lower Extremity Assessment: RLE deficits/detail;LLE deficits/detail RLE Deficits / Details: AROM WFL, increasead time to complete ROM and tremulous throughout range, most limited in R hip flexion RLE Sensation:  (reports numbness/tingling in bil feet) RLE Coordination: decreased gross motor;decreased fine motor LLE Deficits / Details: AROM WFL, increasead time to complete ROM and tremulous throughout range LLE Sensation:  (reports numbness/tingling in bil feet) LLE Coordination: decreased gross motor;decreased fine motor       Communication   Communication Communication: No apparent difficulties    Cognition Arousal: Alert Behavior During Therapy: WFL for tasks assessed/performed   PT - Cognitive impairments: No apparent impairments                         Following commands: Intact       Cueing Cueing Techniques: Verbal cues     General Comments      Exercises     Assessment/Plan    PT Assessment Patient needs continued PT services  PT Problem List Decreased strength;Decreased activity tolerance;Decreased balance;Decreased mobility;Decreased coordination;Decreased knowledge of use of DME       PT Treatment Interventions DME instruction;Gait training;Stair training;Functional mobility training;Therapeutic activities;Therapeutic exercise;Balance training;Patient/family education;Wheelchair mobility training    PT  Goals (Current goals can be found in the Care Plan section)  Acute Rehab PT Goals Patient Stated Goal: agreeable to therapy PT Goal Formulation: With patient Time For Goal Achievement: 04/23/23 Potential to Achieve Goals: Good    Frequency Min 3X/week     Co-evaluation               AM-PAC PT "6 Clicks" Mobility  Outcome Measure Help needed turning from your back to your side while in a flat bed without using bedrails?: A Little Help needed moving from lying on your back to sitting on the side of a flat bed without using bedrails?: A Little Help needed moving to and from a bed to a chair (including a wheelchair)?: A Little Help needed standing up from a chair using your arms (e.g., wheelchair or bedside chair)?: A Little Help needed to walk in hospital room?: A Little Help needed climbing 3-5 steps with a railing? : A Lot 6 Click Score: 17    End of Session Equipment Utilized During Treatment: Gait belt Activity Tolerance: Patient tolerated treatment well;Patient limited by fatigue Patient left: in chair;with call bell/phone within reach;with chair alarm set Nurse Communication: Mobility status PT Visit Diagnosis: Unsteadiness on feet (R26.81);Other abnormalities of gait and mobility (R26.89);Muscle weakness (generalized) (M62.81);History  of falling (Z91.81)    Time: 4098-1191 PT Time Calculation (min) (ACUTE ONLY): 20 min   Charges:   PT Evaluation $PT Eval Moderate Complexity: 1 Mod   PT General Charges $$ ACUTE PT VISIT: 1 Visit         Tori Garth Diffley PT, DPT 04/09/23, 1:16 PM

## 2023-04-09 NOTE — Progress Notes (Signed)

## 2023-04-09 NOTE — Progress Notes (Signed)
 Progress Note   [name] JYN:[id number] DOB: [birth year] DOA: 04/08/2023     1 DOS: the patient was seen and examined on 04/09/2023   Brief hospital course: 57yo with h/o MS who presented on 4/7 with recurrent falls.  Neurology consulted and recommends high-dose steroids x 5 days without repeat imaging (last done in February).  Assessment and Plan:  Multiple sclerosis exacerbation with Relapsing/remitting multiple sclerosis Patient with probable recurrent MS - presenting with LE weakness and recurrent falls Clinically consistent with MS despite no new lesions appreciated on MRI in 02/2023; per neurology, there is no indication to repeat imaging at this time. Admit to Med Surg Will treat with 1 gram Solumedrol IV daily x 5 days Needs empiric PPI while on steroids Neurology consultation is appreciated Physical/occupational therapy consults Continue oxycodone 5 mg every 4 hours as needed. Continue cyclobenzaprine 10 mg p.o. 3 times daily as needed  Hyperglycemia Likely reactive to steroids Last A1c was 5.9 remotely, will reorder Will add sensitive-scale SSI coverage while on steroids  Essential hypertension Continue amlodipine  Elevated AP Previous AST and ALT elevation in addition to AP Currently AST/ALT have normalized AP is significantly improved   Tobacco use disorder Encourage cessation  This was discussed with the patient and should be reviewed on an ongoing basis   Patch declined by patient      Consultants: Neurology PT OT  Procedures: None  Antibiotics: None  30 Day Unplanned Readmission Risk Score    Flowsheet Row ED to Hosp-Admission (Current) from 04/08/2023 in [facility]  30 Day Unplanned Readmission Risk Score (%) 11.54 Filed at 04/09/2023 0801       This score is the patient's risk of an unplanned readmission within 30 days of being discharged (0 -100%). The score is based on dignosis, age,  lab data, medications, orders, and past utilization.   Low:  0-14.9   Medium: 15-21.9   High: 22-29.9   Extreme: 30 and above           Subjective: Reports no significant improvement in symptoms yet.  No new concerns.   Objective: Vitals:   04/09/23 0923 04/09/23 1221  BP: 110/77 126/79  Pulse:  82  Resp:  16  Temp:  98.4 F (36.9 C)  SpO2:  100%    Intake/Output Summary (Last 24 hours) at 04/09/2023 1715 Last data filed at 04/09/2023 1401 Gross per 24 hour  Intake 1017.56 ml  Output 350 ml  Net 667.56 ml   Filed Weights   04/09/23 0917  Weight: 48.6 kg    Exam:  General:  Appears calm and comfortable and is in NAD, tobacco smoke permeates the air Eyes:   EOMI, normal lids, iris ENT:  grossly normal hearing, lips & tongue, mmm Neck:  no LAD, masses or thyromegaly Cardiovascular:  RRR, no m/r/g. No LE edema.  Respiratory:   CTA bilaterally with no wheezes/rales/rhonchi.  Normal respiratory effort. Abdomen:  soft, NT, ND Skin:  no rash or induration seen on limited exam Musculoskeletal:  mild generalized LE > UE weakness, no bony abnormality Psychiatric:  blunted mood and affect, speech fluent and appropriate, AOx3 Neurologic:  CN 2-12 grossly intact, moves all extremities in coordinated fashion  Data Reviewed: I have reviewed the patient's lab results since admission.  Pertinent labs for today include:   CO2 20 Glucose 174 AP 217 Normal CBC     Family Communication: None present  Disposition: Status is: Inpatient Remains inpatient  appropriate because: ongoing management     Time spent: 50 minutes  Unresulted Labs (From admission, onward)     Start     Ordered   04/10/23 0500  CBC with Differential/Platelet  Tomorrow morning,   R        04/09/23 1715   04/10/23 0500  Basic metabolic panel with GFR  Tomorrow morning,   R        04/09/23 1715   04/09/23 1714  Hemoglobin A1c  Once,   R       Comments: To assess prior glycemic control    04/09/23  1713             Author: Jonah Blue, MD 04/09/2023 5:15 PM  For on call review [email or web address].

## 2023-04-09 NOTE — Evaluation (Signed)
 Occupational Therapy Evaluation Patient Details Name: Gabriela Campbell MRN: [id number] DOB: 1965-04-10 Today's Date: 04/09/2023   History of Present Illness   Gabriela Campbell is a 58 yr old female who presents with low back pain after multiple falls. Now to suspected MS exacerbation. PMH: MS, herpes zoster, vitamin D deficiency, polycythemia     Clinical Impressions The pt is currently presenting with the below listed deficits (see OT problem list). As such, her ADL performance is compromised and she requires assistance for tasks such as, lower body dressing, sit to stand, and dynamic standing activities. She reported a history of chronic "pins and needles" sensation in her fingertips, as well as numbness in her bilateral toes. She also reported having ~10 falls over the 6 months, indicating her B LE feel weak and "just give out" spontaneously. Today, she was observed to also present with impaired standing balance, impaired fine motor coordination, and unsteady gait; she appeared to rely heavily on her B UE for support on the walker when in standing. OT will continue to follow the pt for further services in the acute care setting, in order to maximize her functional independence and facilitate her safe return home. Recommend intermittent supervision and assistance, as well as home health OT upon the pt's hospital discharge.      If plan is discharge home, recommend the following:   Help with stairs or ramp for entrance;Assist for transportation     Functional Status Assessment   Patient has had a recent decline in their functional status and demonstrates the ability to make significant improvements in function in a reasonable and predictable amount of time.     Equipment Recommendations   Other (comment) (Rolling walker)     Recommendations for Other Services         Precautions/Restrictions   Precautions Precautions: Fall Restrictions Weight Bearing Restrictions Per  Provider Order: No     Mobility Bed Mobility Overal bed mobility: Needs Assistance Bed Mobility: Supine to Sit     Supine to sit: Supervision, Used rails, HOB elevated          Transfers Overall transfer level: Needs assistance Equipment used: Rolling walker (2 wheels) Transfers: Sit to/from Stand Sit to Stand: Contact guard assist           General transfer comment: slow, tremulous movement, BUE assisting to power up     Balance       Sitting balance - Comments: static sitting-good. dynamic sitting-fair+     Standing balance-Leahy Scale: Poor         ADL either performed or assessed with clinical judgement   ADL Overall ADL's : Needs assistance/impaired Eating/Feeding: Independent;Sitting   Grooming: Contact guard assist;Standing Grooming Details (indicate cue type and reason): She performed teeth brushing at sink level.         Upper Body Dressing : Set up;Sitting   Lower Body Dressing: Contact guard assist;Sit to/from stand   Toilet Transfer: Contact guard assist;Minimal assistance;Rolling walker (2 wheels);Grab bars;Ambulation   Toileting- Clothing Manipulation and Hygiene: Contact guard assist;Minimal assistance Toileting - Clothing Manipulation Details (indicate cue type and reason): At bathroom level, based on clinical judgement             Vision Baseline Vision/History: 1 Wears glasses Additional Comments: She correctly read the time depicted on the wall clock.            Pertinent Vitals/Pain Pain Assessment Pain Assessment: 0-10 Pain Score: 8  Pain Location: low back and  R LE (chronic pain) Pain Intervention(s): Monitored during session, Repositioned     Extremity/Trunk Assessment Upper Extremity Assessment Upper Extremity Assessment: Right hand dominant;LUE deficits/detail;RUE deficits/detail (B UE AROM WFL. R UE grip strength 4-/5. L UE grip strength 4+/5) RUE Deficits / Details: She reports chronic "pins and needles"  sensation to her fingertips. LUE Deficits / Details: She reports chronic "pins and needles" sensation to her fingertips.   Lower Extremity Assessment Lower Extremity Assessment: RLE deficits/detail;LLE deficits/detail RLE Deficits / Details: AROM WFL, increasead time to complete ROM and tremulous throughout range, most limited in R hip flexion RLE Sensation:  (reports chronic numbness/tingling in toes) RLE Coordination: decreased gross motor;decreased fine motor LLE Deficits / Details: AROM WFL, increasead time to complete ROM and tremulous throughout range LLE Sensation:  (reports chronic numbness/tingling in toes) LLE Coordination: decreased gross motor;decreased fine motor       Communication Communication Communication: No apparent difficulties   Cognition Arousal: Alert Behavior During Therapy: WFL for tasks assessed/performed               OT - Cognition Comments: Oriented x4, able to follow commands without difficulty                 Following commands: Intact                  Home Living Family/patient expects to be discharged to:: Private residence Living Arrangements: Parent (father) Available Help at Discharge: Family Type of Home: House Home Access: Stairs to enter Secretary/administrator of Steps: 4 Entrance Stairs-Rails: None Home Layout: One level     Bathroom Shower/Tub: Producer, television/film/video: Standard     Home Equipment: Educational psychologist (4 wheels);Cane - single point          Prior Functioning/Environment Prior Level of Function : Independent/Modified Independent;History of Falls (last six months)             Mobility Comments: pt reports household ambulator with rollator, multiple falls in last 6 months due to BLE give out but able to get up ind ADLs Comments: pt reports ind with self care, fixes microwave meals and simple household chores; dad performs household chores    OT Problem List: Decreased  strength;Impaired balance (sitting and/or standing);Decreased coordination;Impaired sensation   OT Treatment/Interventions: Self-care/ADL training;Therapeutic exercise;Neuromuscular education;Energy conservation;DME and/or AE instruction;Patient/family education;Therapeutic activities;Balance training      OT Goals(Current goals can be found in the care plan section)   Acute Rehab OT Goals Patient Stated Goal: improved strength OT Goal Formulation: With patient Time For Goal Achievement: 04/23/23 Potential to Achieve Goals: Good ADL Goals Pt Will Perform Grooming: with supervision;standing Pt Will Perform Lower Body Dressing: with supervision;sit to/from stand;sitting/lateral leans Pt Will Transfer to Toilet: with supervision;ambulating;grab bars Pt Will Perform Toileting - Clothing Manipulation and hygiene: with supervision;sit to/from stand   OT Frequency:  Min 2X/week       AM-PAC OT "6 Clicks" Daily Activity     Outcome Measure Help from another person eating meals?: None Help from another person taking care of personal grooming?: A Little Help from another person toileting, which includes using toliet, bedpan, or urinal?: A Little Help from another person bathing (including washing, rinsing, drying)?: A Little Help from another person to put on and taking off regular upper body clothing?: A Little Help from another person to put on and taking off regular lower body clothing?: A Little 6 Click Score: 19   End of Session Equipment  Utilized During Treatment: Gait belt;Rolling walker (2 wheels) Nurse Communication: Mobility status  Activity Tolerance: Patient tolerated treatment well Patient left: in chair;with call bell/phone within reach;with chair alarm set  OT Visit Diagnosis: Unsteadiness on feet (R26.81);History of falling (Z91.81);Muscle weakness (generalized) (M62.81)                Time: 1610-9604 OT Time Calculation (min): 24 min Charges:  OT General Charges $OT  Visit: 1 Visit OT Evaluation $OT Eval Moderate Complexity: 1 Mod    Michaeljames Milnes L Shaylon Gillean, OTR/L 04/09/2023, 2:10 PM

## 2023-04-10 ENCOUNTER — Inpatient Hospital Stay: Admission: RE | Admit: 2023-04-10 | Source: Ambulatory Visit

## 2023-04-10 DIAGNOSIS — G35 Multiple sclerosis: Secondary | ICD-10-CM | POA: Diagnosis not present

## 2023-04-10 LAB — BASIC METABOLIC PANEL WITH GFR
Anion gap: 9 (ref 5–15)
BUN: 25 mg/dL — ABNORMAL HIGH (ref 6–20)
CO2: 23 mmol/L (ref 22–32)
Calcium: 9.1 mg/dL (ref 8.9–10.3)
Chloride: 105 mmol/L (ref 98–111)
Creatinine, Ser: 0.65 mg/dL (ref 0.44–1.00)
GFR, Estimated: >60 mL/min (ref >60)
Glucose, Bld: 170 mg/dL — ABNORMAL HIGH (ref 70–99)
Potassium: 4.4 mmol/L (ref 3.5–5.1)
Sodium: 137 mmol/L (ref 135–145)

## 2023-04-10 LAB — CBC WITH DIFFERENTIAL/PLATELET
Abs Immature Granulocytes: 0.13 10*3/uL — ABNORMAL HIGH (ref 0.00–0.07)
Basophils Absolute: 0 10*3/uL (ref 0.0–0.1)
Basophils Relative: 0 %
Eosinophils Absolute: 0 10*3/uL (ref 0.0–0.5)
Eosinophils Relative: 0 %
HCT: 41.3 % (ref 36.0–46.0)
Hemoglobin: 13.6 g/dL (ref 12.0–15.0)
Immature Granulocytes: 1 %
Lymphocytes Relative: 5 %
Lymphs Abs: 1.1 10*3/uL (ref 0.7–4.0)
MCH: 32.3 pg (ref 26.0–34.0)
MCHC: 32.9 g/dL (ref 30.0–36.0)
MCV: 98.1 fL (ref 80.0–100.0)
Monocytes Absolute: 0.2 10*3/uL (ref 0.1–1.0)
Monocytes Relative: 1 %
Neutro Abs: 19.7 10*3/uL — ABNORMAL HIGH (ref 1.7–7.7)
Neutrophils Relative %: 93 %
Platelets: 170 10*3/uL (ref 150–400)
RBC: 4.21 MIL/uL (ref 3.87–5.11)
RDW: 13.1 % (ref 11.5–15.5)
WBC: 21.1 10*3/uL — ABNORMAL HIGH (ref 4.0–10.5)
nRBC: 0 % (ref 0.0–0.2)

## 2023-04-10 LAB — GLUCOSE, CAPILLARY
Glucose-Capillary: 161 mg/dL — ABNORMAL HIGH (ref 70–99)
Glucose-Capillary: 238 mg/dL — ABNORMAL HIGH (ref 70–99)
Glucose-Capillary: 141 mg/dL — ABNORMAL HIGH (ref 70–99)
Glucose-Capillary: 169 mg/dL — ABNORMAL HIGH (ref 70–99)

## 2023-04-10 LAB — HEMOGLOBIN A1C
Hgb A1c MFr Bld: 5.5 % (ref 4.8–5.6)
Mean Plasma Glucose: 111 mg/dL

## 2023-04-10 NOTE — Progress Notes (Signed)
  Progress Note   [name] ZOX:[id number] DOB: December 13, 1965 DOA: 04/08/2023     2 DOS: the patient was seen and examined on 04/10/2023   Brief hospital course: 58yo with h/o MS who presented on 4/7 with recurrent falls.  Neurology consulted and recommends high-dose steroids x 5 days without repeat imaging (last done in February).  Assessment and Plan: MS exacerbation with relapsing/remitting MS  - IV solumedrol 1000 mg daily (end Fri April 12 2023) - Flexeril 10 mg PO bid PRN  - IV Fentanyl 25 mcg q2 hr PRN  - Lidocaine 5% patch q24 hr  - Oxycodone 5 mg PO q4 hr PRN  - Protonix 40 mg PO daily   Hyperglycemia  - Novolog SS tid   HTN  - Norvasc 2.5 mg PO daily   Tobacco use disorder  - Previous provider discussed cessation  - Patch declined by pt   Subjective: Pt seen and examined at the bedside. She continues in the hospital to complete her 5 days of IV steroids. Glucose this morning was 161.   Physical Exam: Vitals:   04/09/23 1936 04/10/23 0500 04/10/23 0500 04/10/23 0931  BP: 116/71  106/78 106/78  Pulse: 89  79   Resp: 19  16   Temp: 98.7 F (37.1 C)  97.6 F (36.4 C)   TempSrc: Oral  Oral   SpO2: 97%  99%   Weight:  48.5 kg     Physical Exam HENT:     Head: Normocephalic.     Mouth/Throat:     Mouth: Mucous membranes are moist.  Cardiovascular:     Rate and Rhythm: Normal rate and regular rhythm.  Pulmonary:     Effort: Pulmonary effort is normal.  Abdominal:     Palpations: Abdomen is soft.  Musculoskeletal:        General: Normal range of motion.     Cervical back: Neck supple.  Skin:    General: Skin is warm.  Neurological:     Mental Status: She is alert. Mental status is at baseline.  Psychiatric:        Mood and Affect: Mood normal.      Disposition: Status is: Inpatient Remains inpatient appropriate because: Requires 5 days of IV steroids per neurology   Planned Discharge Destination: Home    Time spent: 35  minutes  Author: Baron Hamper , MD 04/10/2023 9:47 AM  For on call review [email or web address].

## 2023-04-10 NOTE — Plan of Care (Signed)

## 2023-04-10 NOTE — Plan of Care (Signed)
   Problem: Nutrition: Goal: Adequate nutrition will be maintained Outcome: Progressing   Problem: Pain Managment: Goal: General experience of comfort will improve and/or be controlled Outcome: Progressing   Problem: Safety: Goal: Ability to remain free from injury will improve Outcome: Progressing

## 2023-04-10 NOTE — Plan of Care (Addendum)
 VSS. Patient given PRN Oxycodone, Flexeril, and Tylenol for pain. BG 226 at bedtime. No acute events overnight.  Problem: Education: Goal: Knowledge of General Education information will improve Description: Including pain rating scale, medication(s)/side effects and non-pharmacologic comfort measures Outcome: Progressing   Problem: Clinical Measurements: Goal: Ability to maintain clinical measurements within normal limits will improve Outcome: Progressing   Problem: Activity: Goal: Risk for activity intolerance will decrease Outcome: Progressing   Problem: Pain Managment: Goal: General experience of comfort will improve and/or be controlled Outcome: Progressing   Problem: Safety: Goal: Ability to remain free from injury will improve Outcome: Progressing   Problem: Education: Goal: Ability to describe self-care measures that may prevent or decrease complications (Diabetes Survival Skills Education) will improve Outcome: Progressing   Problem: Fluid Volume: Goal: Ability to maintain a balanced intake and output will improve Outcome: Progressing   Problem: Metabolic: Goal: Ability to maintain appropriate glucose levels will improve Outcome: Progressing   Problem: Nutritional: Goal: Maintenance of adequate nutrition will improve Outcome: Progressing

## 2023-04-11 DIAGNOSIS — G35 Multiple sclerosis: Secondary | ICD-10-CM | POA: Diagnosis not present

## 2023-04-11 LAB — GLUCOSE, CAPILLARY
Glucose-Capillary: 143 mg/dL — ABNORMAL HIGH (ref 70–99)
Glucose-Capillary: 313 mg/dL — ABNORMAL HIGH (ref 70–99)
Glucose-Capillary: 148 mg/dL — ABNORMAL HIGH (ref 70–99)
Glucose-Capillary: 189 mg/dL — ABNORMAL HIGH (ref 70–99)

## 2023-04-11 MED ORDER — FENTANYL CITRATE PF 50 MCG/ML IJ SOSY
25.0000 ug | PREFILLED_SYRINGE | INTRAMUSCULAR | Status: AC | PRN
  Administered 2023-04-11 (×2): 25 ug via INTRAVENOUS
  Filled 2023-04-11 (×2): qty 1

## 2023-04-11 MED ORDER — SODIUM CHLORIDE 0.9 % IV SOLN
1000.0000 mg | Freq: Once | INTRAVENOUS | Status: AC
  Administered 2023-04-11: 1000 mg via INTRAVENOUS
  Filled 2023-04-11: qty 16

## 2023-04-11 MED ORDER — SODIUM CHLORIDE 0.9 % IV SOLN
1000.0000 mg | Freq: Once | INTRAVENOUS | Status: AC
  Administered 2023-04-12: 1000 mg via INTRAVENOUS
  Filled 2023-04-11: qty 16

## 2023-04-11 NOTE — TOC Transition Note (Addendum)
 Transition of Care [facility]) - Discharge Note   Patient Details  Name: Gabriela Campbell MRN: [id number] Date of Birth: 11-25-1965  Transition of [facility]) CM/SW Contact:  Diona Browner, LCSW Phone Number: 04/11/2023, 12:04 PM   Clinical Narrative:    Pt from home. Pt recommended HHPT. Pt accepting of recommendation. HHPT setup with [facility]. Pt has RW at home. No further TOC needs.    Final next level of care: Home w Home Health Services Barriers to Discharge: No Barriers Identified   Patient Goals and CMS Choice Patient states their goals for this hospitalization and ongoing recovery are:: return home CMS Medicare.gov Compare Post Acute Care list provided to::  (NA) Choice offered to / list presented to : NA [place] ownership interest in [facility] 2.provided to::  (NA)    Discharge Placement                       Discharge Plan and Services Additional resources added to the After Visit Summary for                  DME Arranged: N/A DME Agency: NA       HH Arranged: PT HH Agency: [facility] Date [facility] Agency Contacted: 04/11/23 Time HH Agency Contacted: 1203 Representative spoke with at [facility] Agency: Kandee Keen  Social Drivers of Health (SDOH) Interventions SDOH Screenings   Food Insecurity: No Food Insecurity (04/08/2023)  Housing: Low Risk  (04/08/2023)  Transportation Needs: No Transportation Needs (04/08/2023)  Utilities: Not At Risk (04/08/2023)  Depression (PHQ2-9): High Risk (10/25/2020)  Social Connections: Socially Isolated (02/14/2023)  Tobacco Use: High Risk (04/08/2023)     Readmission Risk Interventions    04/11/2023   12:02 PM  Readmission Risk Prevention Plan  Post Dischage Appt Complete  Medication Screening Complete  Transportation Screening Complete

## 2023-04-11 NOTE — TOC CM/SW Note (Signed)

## 2023-04-11 NOTE — Progress Notes (Signed)
  Progress Note   [name] ZOX:[id number] DOB: 07/30/1965 DOA: 04/08/2023     3 DOS: the patient was seen and examined on 04/11/2023   Brief hospital course: 57yo with h/o MS who presented on 4/7 with recurrent falls.  Neurology consulted and recommends high-dose steroids x 5 days without repeat imaging (last done in February).  Assessment and Plan: MS exacerbation with relapsing/remitting MS  - IV solumedrol 1000 mg daily (end Fri April 12 2023) - Flexeril 10 mg PO bid PRN  - IV Fentanyl 25 mcg q2 hr PRN  - Lidocaine 5% patch q24 hr  - Oxycodone 5 mg PO q4 hr PRN  - Protonix 40 mg PO daily    Hyperglycemia  - Novolog SS tid    HTN  - Norvasc 2.5 mg PO daily    Tobacco use disorder  - Previous provider discussed cessation  - Patch declined by pt   Subjective: Pt seen and examined at the bedside. No acute complaints. Planning for last dose of IV steroids tmr and then discharge.   Physical Exam: Vitals:   04/10/23 1958 04/11/23 0410 04/11/23 0437 04/11/23 0628  BP: 108/86 100/70    Pulse: 73 65    Resp: 18 17    Temp: 98 F (36.7 C) 97.7 F (36.5 C)    TempSrc: Oral Oral    SpO2: 100% 98%    Weight:   48.5 kg 48.5 kg  Height:    5\' 2"  (1.575 m)   HENT:     Head: Normocephalic.     Mouth/Throat:     Mouth: Mucous membranes are moist.  Cardiovascular:     Rate and Rhythm: Normal rate and regular rhythm.  Pulmonary:     Effort: Pulmonary effort is normal.  Abdominal:     Palpations: Abdomen is soft.  Musculoskeletal:        General: Normal range of motion.     Cervical back: Neck supple.  Skin:    General: Skin is warm.  Neurological:     Mental Status: She is alert. Mental status is at baseline.  Psychiatric:        Mood and Affect: Mood normal.      Disposition: Status is: Inpatient Remains inpatient appropriate because: IV steroids   Planned Discharge Destination: Home    Time spent: 35 minutes  Author: Baron Hamper ,  MD 04/11/2023 8:21 AM  For on call review [email or web address].

## 2023-04-11 NOTE — Progress Notes (Addendum)
 Physical Therapy Treatment Patient Details Name: Gabriela Campbell MRN: [id number] DOB: [birth year] Today's Date: 04/11/2023   History of Present Illness Gabriela Campbell is a 58 y.o. female presents with low back pain after multiple falls. PMH: MS, shingles    PT Comments  Pt states that she is feeling better today, denies pain. She is able to complete bed mobility from supine to standing at mod I. For transfers away from EOB, AD or assist required for safety. Pt is able to amb x130 ft with 4WW, she is familiar with device as it is her primary AD at home. She has intermittent RLE scuff/dec clearance during RLE advancement but is able to maintain safety with AD. Returns to room. Pt has 3 steps to enter her home and strength adequate to allow this, concern for balance, but with her helper and gait belt, I do not feel this will be an issue, especially with her progress and she continues to feel better. Stairs not formally assessed for energy conservation.    If plan is discharge home, recommend the following: A little help with walking and/or transfers;A little help with bathing/dressing/bathroom;Assistance with cooking/housework;Assist for transportation;Help with stairs or ramp for entrance   Can travel by private vehicle        Equipment Recommendations  None recommended by PT    Recommendations for Other Services       Precautions / Restrictions Precautions Precautions: Fall Recall of Precautions/Restrictions: Intact Restrictions Weight Bearing Restrictions Per Provider Order: No     Mobility  Bed Mobility Overal bed mobility: Modified Independent             General bed mobility comments: Pt able to complete progression from supine to standing at Mod I with inc time    Transfers Overall transfer level: Modified independent Equipment used: Rollator (4 wheels) Transfers: Sit to/from Stand Sit to Stand: Modified independent (Device/Increase time)           General  transfer comment: Pt able to complete STS without AD but would require for mobility away from EOB as she works towards restoring function    Ambulation/Gait Ambulation/Gait assistance: Contact guard assist Gait Distance (Feet): 130 Feet Assistive device: Rollator (4 wheels) Gait Pattern/deviations: Step-to pattern, Decreased stride length, Trunk flexed, Narrow base of support Gait velocity: decreased     General Gait Details: RLE has intermittent decreased clearance during swing phase, good safety awareness with directional changes as she slows cadence   Stairs             Wheelchair Mobility     Tilt Bed    Modified Rankin (Stroke Patients Only)       Balance Overall balance assessment: Needs assistance, History of Falls Sitting-balance support: Feet supported, No upper extremity supported Sitting balance-Leahy Scale: Normal     Standing balance support: No upper extremity supported Standing balance-Leahy Scale: Fair                              Communication    Cognition Arousal: Alert Behavior During Therapy: WFL for tasks assessed/performed   PT - Cognitive impairments: No apparent impairments                                Cueing    Exercises      General Comments        Pertinent Vitals/Pain Pain  Assessment Pain Assessment: No/denies pain Pain Intervention(s): Monitored during session    Home Living                          Prior Function            PT Goals (current goals can now be found in the care plan section) Acute Rehab PT Goals Patient Stated Goal: agreeable to therapy PT Goal Formulation: With patient Time For Goal Achievement: 04/23/23 Potential to Achieve Goals: Good Progress towards PT goals: Progressing toward goals    Frequency    Min 3X/week      PT Plan      Co-evaluation              AM-PAC PT "6 Clicks" Mobility   Outcome Measure  Help needed turning from your  back to your side while in a flat bed without using bedrails?: None Help needed moving from lying on your back to sitting on the side of a flat bed without using bedrails?: None Help needed moving to and from a bed to a chair (including a wheelchair)?: A Little Help needed standing up from a chair using your arms (e.g., wheelchair or bedside chair)?: None Help needed to walk in hospital room?: A Little Help needed climbing 3-5 steps with a railing? : A Little 6 Click Score: 21    End of Session Equipment Utilized During Treatment: Gait belt Activity Tolerance: Patient tolerated treatment well;Other (comment) (limited due to nature of dx-MS exacerbation) Patient left: in bed;with call bell/phone within reach;Other (comment) (MD arrives at the conclusion of the sessio) Nurse Communication: Mobility status PT Visit Diagnosis: Unsteadiness on feet (R26.81);Other abnormalities of gait and mobility (R26.89);Muscle weakness (generalized) (M62.81);History of falling (Z91.81)     Time: 4098-1191 PT Time Calculation (min) (ACUTE ONLY): 14 min  Charges:    $Gait Training: 8-22 mins PT General Charges $$ ACUTE PT VISIT: 1 Visit                     Madaline Guthrie, PT Acute Rehabilitation Services Office: [phone] 04/11/2023    Evelena Peat 04/11/2023, 3:31 PM

## 2023-04-11 NOTE — Progress Notes (Signed)
 NEUROLOGY CONSULT FOLLOW UP NOTE   Date of service: April 11, 2023 Patient Name: Gabriela Campbell MRN:  [id number] DOB:  [birth year]  Interval Hx/subjective   She has not noticed much benefit from steroids Vitals   Vitals:   04/11/23 0437 04/11/23 0628 04/11/23 1149 04/11/23 2034  BP:   108/77 105/86  Pulse:   84 72  Resp:   16 16  Temp:   98 F (36.7 C) 98.2 F (36.8 C)  TempSrc:   Oral   SpO2:   100% 99%  Weight: 48.5 kg 48.5 kg    Height:  5\' 2"  (1.575 m)       Body mass index is 19.56 kg/m.  Physical Exam   Constitutional: Appears well-developed and well-nourished.  Neurologic Examination    Neuro: Mental Status: Patient is awake, alert, oriented to person, place, month, year, and situation. Patient is able to give a clear and coherent history. No signs of aphasia or neglect Cranial Nerves: II: Visual Fields are full. Pupils are equal, round, and reactive to light.   III,IV, VI: EOMI without ptosis or diploplia.  V: Facial sensation is symmetric to temperature VII: Facial movement with Mild right facial weakness Motor: She has bilateral lower extremity weakness 4/5, though slightly worse on the right than left.  She also has 4/5 weakness of the right arm and 4+/5 in the left arm. Sensory: Sensation is diminished on the right  Cerebellar: FNF appears ataxic on the left > right    Medications  Current Facility-Administered Medications:    acetaminophen (TYLENOL) tablet 650 mg, 650 mg, Oral, Q6H PRN, 650 mg at 04/11/23 1218 **OR** acetaminophen (TYLENOL) suppository 650 mg, 650 mg, Rectal, Q6H PRN, Bobette Mo, MD   amLODipine [facility]) tablet 2.5 mg, 2.5 mg, Oral, Daily, Bobette Mo, MD, 2.5 mg at 04/11/23 6644   Chlorhexidine Gluconate Cloth 2 % PADS 6 each, 6 each, Topical, Daily, Jonah Blue, MD, 6 each at 04/11/23 1219   cyclobenzaprine (FLEXERIL) tablet 10 mg, 10 mg, Oral, BID PRN, Bobette Mo, MD, 10 mg at 04/11/23 0857    enoxaparin (LOVENOX) injection 30 mg, 30 mg, Subcutaneous, Q24H, Bobette Mo, MD, 30 mg at 04/10/23 2115   insulin aspart (novoLOG) injection 0-9 Units, 0-9 Units, Subcutaneous, TID WC, Jonah Blue, MD, 1 Units at 04/11/23 1800   lidocaine (LIDODERM) 5 % 1 patch, 1 patch, Transdermal, Q24H, [place], Adam, DO, 1 patch at 04/11/23 1705   [START ON 04/12/2023] methylPREDNISolone sodium succinate (SOLU-MEDROL) 1,000 mg in sodium chloride 0.9 % 50 mL IVPB, 1,000 mg, Intravenous, Once, Baron Hamper, MD   ondansetron (ZOFRAN) tablet 4 mg, 4 mg, Oral, Q6H PRN **OR** ondansetron (ZOFRAN) injection 4 mg, 4 mg, Intravenous, Q6H PRN, Bobette Mo, MD   oxyCODONE (Oxy IR/ROXICODONE) immediate release tablet 5 mg, 5 mg, Oral, Q4H PRN, Bobette Mo, MD, 5 mg at 04/11/23 1218   pantoprazole (PROTONIX) EC tablet 40 mg, 40 mg, Oral, Daily, Jonah Blue, MD, 40 mg at 04/11/23 0857   sodium chloride flush (NS) 0.9 % injection 10-40 mL, 10-40 mL, Intracatheter, Q12H, Jonah Blue, MD, 10 mL at 04/11/23 1219   ursodiol (ACTIGALL) capsule 300 mg, 300 mg, Oral, BID, Bobette Mo, MD, 300 mg at 04/11/23 0857  Labs and Diagnostic Imaging    Assessment   Gabriela Campbell is a 58 y.o. female with multiple sclerosis who presents with worsening gait difficulty over the past few months.  Given her progressing deficits,  I did think a steroid trial was worthwhile, but without response my suspicion is that she may be approaching the progressive stage of her disease.  I think her ataxia is her primary issue at the current time, and she will continue work with physical therapy.  Following her fifth dose of steroids, she still may get some degree of benefit, and it is worthwhile to finish it, but I would not do any further treatment beyond this.  Recommendations  Last dose of Solu-Medrol tomorrow Can discharge following fifth dose of steroids. Neurology will be available as  needed.  ______________________________________________________________________   Stormy Card, MD [facility]

## 2023-04-12 DIAGNOSIS — I10 Essential (primary) hypertension: Secondary | ICD-10-CM | POA: Diagnosis not present

## 2023-04-12 DIAGNOSIS — R748 Abnormal levels of other serum enzymes: Secondary | ICD-10-CM

## 2023-04-12 DIAGNOSIS — Z66 Do not resuscitate: Secondary | ICD-10-CM

## 2023-04-12 DIAGNOSIS — Z720 Tobacco use: Secondary | ICD-10-CM

## 2023-04-12 DIAGNOSIS — G35 Multiple sclerosis: Secondary | ICD-10-CM | POA: Diagnosis not present

## 2023-04-12 LAB — GLUCOSE, CAPILLARY
Glucose-Capillary: 164 mg/dL — ABNORMAL HIGH (ref 70–99)
Glucose-Capillary: 165 mg/dL — ABNORMAL HIGH (ref 70–99)

## 2023-04-12 MED ORDER — OXYCODONE HCL 10 MG PO TABS: 5.0000 mg | ORAL_TABLET | ORAL | 0 refills | Status: DC | PRN

## 2023-04-12 NOTE — TOC Transition Note (Signed)
 Transition of Care [facility]) - Discharge Note   Patient Details  Name: Gabriela Campbell MRN: [id number] Date of Birth: Jul 08, 1965  Transition of [facility]) CM/SW Contact:  Lanier Clam, RN Phone Number: 04/12/2023, 2:11 PM   Clinical Narrative:  [facility] HHPT rep Kandee Keen aware. No further CM needs.     Final next level of care: Home w Home Health Services Barriers to Discharge: No Barriers Identified   Patient Goals and CMS Choice Patient states their goals for this hospitalization and ongoing recovery are:: return home CMS Medicare.gov Compare Post Acute Care list provided to::  (NA) Choice offered to / list presented to : NA  ownership interest in [facility].provided to::  (NA)    Discharge Placement                       Discharge Plan and Services Additional resources added to the After Visit Summary for                  DME Arranged: N/A DME Agency: NA       HH Arranged: PT HH Agency: [facility] Date [facility] Agency Contacted: 04/12/23 Time HH Agency Contacted: 1411 Representative spoke with at [facility] Agency: Kandee Keen  Social Drivers of Health (SDOH) Interventions SDOH Screenings   Food Insecurity: No Food Insecurity (04/08/2023)  Housing: Low Risk  (04/08/2023)  Transportation Needs: No Transportation Needs (04/08/2023)  Utilities: Not At Risk (04/08/2023)  Depression (PHQ2-9): High Risk (10/25/2020)  Social Connections: Socially Isolated (02/14/2023)  Tobacco Use: High Risk (04/08/2023)     Readmission Risk Interventions    04/11/2023   12:02 PM  Readmission Risk Prevention Plan  Post Dischage Appt Complete  Medication Screening Complete  Transportation Screening Complete

## 2023-04-12 NOTE — Subjective & Objective (Signed)
 Pt seen and examined. Well known to me from prior admissions.  Completing her fifth day of IV Solu-Medrol then discharged home.  Patient feels stable for discharge.  She wants home health PT.

## 2023-04-12 NOTE — Assessment & Plan Note (Signed)
 04-08-2023 through 04-11-2023 see documentation by Dr. Nelda Severe 04-12-2023 pt to complete her 5th day of IV solumedrol 1000 mg and DC to home. Neurology saw pt yesterday. No new recs. Pt to f/u with outpatient neurologist.

## 2023-04-12 NOTE — Assessment & Plan Note (Signed)
 04-12-2023 stable. Continue norvasc 2.5 mg daily.

## 2023-04-12 NOTE — Discharge Summary (Signed)
 [facility] Physician Discharge Summary   Patient name: Gabriela Campbell  Admit date:     04/08/2023  Discharge date: 04/12/2023  Attending Physician: Bobette Mo [[id number]]  Discharge Physician: Carollee Herter   PCP: [facility]  Admitted From: Home  Disposition:  Home  Recommendations for Outpatient Follow-up:  Follow up with PCP in 1-2 weeks Follow up with neurology in 2-4 weeks  Home Health:Yes. Home PT Equipment/Devices: None  Discharge Condition:Stable CODE STATUS:DNR/DNI Diet recommendation: Heart Healthy Fluid Restriction: None  Hospital Summary: HPI: Gabriela Campbell is a 58 y.o. female with medical history significant of seasonal allergies, herpes zoster, generalized weakness, polycythemia, vitamin D deficiency, history of tobacco use, history of elevated LFTs, multiple sclerosis who presented to the emergency department with complaints of having multiple falls in the last few days.  She has similar symptoms in February but had benign imaging and was not admitted.  However, given current symptomatology, neurology has recommended high-dose methylprednisolone.  She denied fever, chills, rhinorrhea, sore throat, wheezing or hemoptysis.  No chest pain, palpitations, diaphoresis, PND, orthopnea or pitting edema of the lower extremities.  No abdominal pain, nausea, emesis, diarrhea, constipation, melena or hematochezia.  No flank pain, dysuria, frequency or hematuria.  No polyuria, polydipsia, polyphagia or blurred vision.    ED course: Initial vital signs were temperature 98.6 F, pulse 89, respirations 16, BP 146/95 mmHg and O2 sat 99% on room air.  The patient received oral/parenteral potassium replacement, fentanyl 50 mcg IVP and acetaminophen 650 mg p.o. x 1.  Significant Events: Admitted 04/08/2023 for MS flare   Significant Labs: WBC 10, HgB 16.1, plt 187 Na 140, K 2.9, CO2 of 29, BUN 13, Scr 0.73, glu 92  Significant Imaging Studies: CXR No  acute findings in the chest.  Pelvic XR No acute osseous abnormality  CT head, CT c-spine No CT evidence of acute intracranial abnormality. No acute fracture or traumatic malalignment of the cervical spine. Soft tissue along the lateral wall of the extra thoracic trachea which may reflect respiratory secretions. Recommend correlation for history of aspiration CT L-spine No acute fracture or traumatic malalignment of the lumbar spine   Antibiotic Therapy: Anti-infectives (From admission, onward)    None       Procedures:   Consultants: Neurology   Hospital Course by Problem: * Multiple sclerosis exacerbation (HCC) 04-08-2023 through 04-11-2023 see documentation by Dr. Nelda Severe 04-12-2023 pt to complete her 5th day of IV solumedrol 1000 mg and DC to home. Neurology saw pt yesterday. No new recs. Pt to f/u with outpatient neurologist.  Relapsing remitting multiple sclerosis (HCC) 04-08-2023 through 04-11-2023 see documentation by Dr. Nelda Severe 04-12-2023 pt to complete her 5th day of IV solumedrol 1000 mg and DC to home. Neurology saw pt yesterday. No new recs. Pt to f/u with outpatient neurologist.  DNR (do not resuscitate) 04-12-2023 stable. Chronic.  Tobacco use 04-12-2023 pt advised to stop smoking.  Polycythemia 04-12-2023 HgB 13.6 on 04-10-2023  Essential hypertension 04-12-2023 stable. Continue norvasc 2.5 mg daily.  Elevated liver enzymes 04-12-2023 AST/ALT elevation has resolved. Alk phos still still elevated but has decreased by 50% since 02-2023   Discharge Diagnoses:  Principal Problem:   Multiple sclerosis exacerbation (HCC) Active Problems:   Relapsing remitting multiple sclerosis (HCC)   Elevated liver enzymes   Essential hypertension   Polycythemia   Tobacco use   DNR (do not resuscitate)  Discharge Instructions  Discharge Instructions     Call MD for:  difficulty breathing, headache  or visual disturbances   Complete by: As directed    Call MD for:  extreme  fatigue   Complete by: As directed    Call MD for:  hives   Complete by: As directed    Call MD for:  persistant dizziness or light-headedness   Complete by: As directed    Call MD for:  persistant nausea and vomiting   Complete by: As directed    Call MD for:  redness, tenderness, or signs of infection (pain, swelling, redness, odor or green/yellow discharge around incision site)   Complete by: As directed    Call MD for:  severe uncontrolled pain   Complete by: As directed    Call MD for:  temperature >100.4   Complete by: As directed    Diet - low sodium heart healthy   Complete by: As directed    Discharge instructions   Complete by: As directed    1. Follow up with your primary care provider in 1-2 weeks following discharge from hospital. 2. Follow up with your outpatient neurologist in 2-4 weeks. You will need to call for appointment.   Increase activity slowly   Complete by: As directed       Allergies as of 04/12/2023       Reactions   Dilaudid [hydromorphone Hcl] Anaphylaxis   Fish Allergy Anaphylaxis   Ibuprofen Anaphylaxis   Iodine Anaphylaxis   Penicillins Anaphylaxis   Shellfish Allergy Anaphylaxis   Shrimp Extract Anaphylaxis   Tramadol Nausea And Vomiting, Anaphylaxis   Baclofen Other (See Comments)   Makes the patient feel wobbly   Gabapentin Other (See Comments)   GI upset   Codeine Nausea Only        Medication List     TAKE these medications    amLODipine 5 MG tablet Commonly known as: NORVASC Take 0.5 tablets (2.5 mg total) by mouth daily. What changed:  how much to take when to take this   cyclobenzaprine 10 MG tablet Commonly known as: FLEXERIL Take 1 tablet (10 mg total) by mouth 2 (two) times daily as needed for muscle spasms. What changed: when to take this   Oxycodone HCl 10 MG Tabs Take 0.5 tablets (5 mg total) by mouth every 4 (four) hours as needed for up to 7 days. What changed: reasons to take this   ursodiol 300 MG  capsule Commonly known as: ACTIGALL Take 600 mg by mouth in the morning.   VITAMIN B12 PO Take 1 tablet by mouth daily.   Vitamin D3 25 MCG (1000 UT) Caps Take 2,000 Units by mouth daily.        Allergies  Allergen Reactions   Dilaudid [Hydromorphone Hcl] Anaphylaxis   Fish Allergy Anaphylaxis   Ibuprofen Anaphylaxis   Iodine Anaphylaxis   Penicillins Anaphylaxis   Shellfish Allergy Anaphylaxis   Shrimp Extract Anaphylaxis   Tramadol Nausea And Vomiting and Anaphylaxis   Baclofen Other (See Comments)    Makes the patient feel wobbly   Gabapentin Other (See Comments)    GI upset   Codeine Nausea Only   Discharge Exam: Vitals:   04/11/23 2034 04/12/23 0423  BP: 105/86 118/77  Pulse: 72 67  Resp: 16 16  Temp: 98.2 F (36.8 C) 97.8 F (36.6 C)  SpO2: 99% 100%   Physical Exam Vitals and nursing note reviewed.  Constitutional:      General: She is not in acute distress.    Appearance: She is not toxic-appearing or diaphoretic.  Comments: Chronically ill appearing  HENT:     Head: Normocephalic and atraumatic.     Nose: Nose normal.  Cardiovascular:     Rate and Rhythm: Normal rate and regular rhythm.  Pulmonary:     Effort: Pulmonary effort is normal.     Breath sounds: Normal breath sounds.  Abdominal:     General: Bowel sounds are normal. There is no distension.     Palpations: Abdomen is soft.  Musculoskeletal:     Right lower leg: No edema.     Left lower leg: No edema.  Skin:    General: Skin is warm and dry.     Capillary Refill: Capillary refill takes less than 2 seconds.  Neurological:     Mental Status: She is alert and oriented to person, place, and time.    The results of significant diagnostics from this hospitalization (including imaging, microbiology, ancillary and laboratory) are listed below for reference.    Labs:  Basic Metabolic Panel: Recent Labs  Lab 04/08/23 1213 04/08/23 1809 04/09/23 0540 04/10/23 0407  NA 140  --   136 137  K 2.9*  --  4.6 4.4  CL 102  --  108 105  CO2 29  --  20* 23  GLUCOSE 92  --  174* 170*  BUN 13  --  20 25*  CREATININE 0.73  --  0.72 0.65  CALCIUM 9.5  --  9.0 9.1  MG 1.9  --   --   --   PHOS  --  2.9  --   --    Liver Function Tests: Recent Labs  Lab 04/08/23 1213 04/09/23 0540  AST 16 14*  ALT 15 12  ALKPHOS 257* 217*  BILITOT 0.5 0.5  PROT 8.3* 6.8  ALBUMIN 4.0 3.4*   No results for input(s): "LIPASE", "AMYLASE" in the last 168 hours. Recent Labs  Lab 04/08/23 1215  AMMONIA 14   CBC: Recent Labs  Lab 04/08/23 1213 04/09/23 0540 04/10/23 0407  WBC 10.0 9.9 21.1*  NEUTROABS 6.9  --  19.7*  HGB 16.1* 14.4 13.6  HCT 48.5* 43.9 41.3  MCV 96.8 98.9 98.1  PLT 187 165 170   CBG: Recent Labs  Lab 04/11/23 1131 04/11/23 1748 04/11/23 2034 04/12/23 0727 04/12/23 1230  GLUCAP 313* 148* 189* 164* 165*   Hgb A1c Recent Labs    04/09/23 1732  HGBA1C 5.5   Urinalysis    Component Value Date/Time   COLORURINE YELLOW 04/08/2023 1515   APPEARANCEUR CLOUDY (A) 04/08/2023 1515   LABSPEC 1.014 04/08/2023 1515   PHURINE 7.0 04/08/2023 1515   GLUCOSEU NEGATIVE 04/08/2023 1515   HGBUR NEGATIVE 04/08/2023 1515   BILIRUBINUR NEGATIVE 04/08/2023 1515   KETONESUR NEGATIVE 04/08/2023 1515   PROTEINUR 100 (A) 04/08/2023 1515   NITRITE NEGATIVE 04/08/2023 1515   LEUKOCYTESUR NEGATIVE 04/08/2023 1515   Sepsis Labs Recent Labs  Lab 04/08/23 1213 04/09/23 0540 04/10/23 0407  WBC 10.0 9.9 21.1*    Procedures/Studies: CT Lumbar Spine Wo Contrast Result Date: 04/08/2023 CLINICAL DATA:  Trauma, recurrent falls. Leg weakness, right-sided pain and back pain. EXAM: CT LUMBAR SPINE WITHOUT CONTRAST TECHNIQUE: Multidetector CT imaging of the lumbar spine was performed without intravenous contrast administration. Multiplanar CT image reconstructions were also generated. RADIATION DOSE REDUCTION: This exam was performed according to the departmental  dose-optimization program which includes automated exposure control, adjustment of the mA and/or kV according to patient size and/or use of iterative reconstruction technique. COMPARISON:  Lumbar spine  MRI 02/13/2023. FINDINGS: Segmentation: 5 lumbar type vertebrae. Alignment: Lumbar lordosis is maintained. No listhesis. Mild levocurvature of the lumbar spine centered at L3-4. Vertebrae: No evidence of compression fracture or displaced fracture in the lumbar spine. No suspicious osseous lesion. Paraspinal and other soft tissues: The visualized paraspinal soft tissues are unremarkable. Cholecystectomy surgical clips. Mild atherosclerosis of the abdominal aorta and branch vessels. Disc levels: Intervertebral disc spaces are maintained. Small disc bulges at multiple levels. No high-grade spinal canal stenosis. Disc bulge and facet arthrosis at L4-5 without significant spinal canal stenosis. No high-grade osseous foraminal stenosis. IMPRESSION: No acute fracture or traumatic malalignment of the lumbar spine. Electronically Signed   By: Emily Filbert M.D.   On: 04/08/2023 15:40   CT HEAD WO CONTRAST ( ) Result Date: 04/08/2023 CLINICAL DATA:  Trauma, recurrent falls for the past 2 weeks. Leg weakness, right-sided generalized pain. EXAM: CT HEAD WITHOUT CONTRAST CT CERVICAL SPINE WITHOUT CONTRAST TECHNIQUE: Multidetector CT imaging of the head and cervical spine was performed following the standard protocol without intravenous contrast. Multiplanar CT image reconstructions of the cervical spine were also generated. RADIATION DOSE REDUCTION: This exam was performed according to the departmental dose-optimization program which includes automated exposure control, adjustment of the mA and/or kV according to patient size and/or use of iterative reconstruction technique. COMPARISON:  MRI head 02/13/2023. FINDINGS: CT HEAD FINDINGS Brain: No acute intracranial hemorrhage. No CT evidence of acute infarct. Focal  hypoattenuation in the right corona radiata likely related to demyelinating lesion better evaluated on recent MRI. No edema, mass effect, or midline shift. The basilar cisterns are patent. Ventricles: The ventricles are normal. Vascular: No hyperdense vessel or unexpected calcification. Skull: No acute or aggressive finding. Orbits: Orbits are symmetric. Sinuses: The visualized paranasal sinuses are clear. Other: Mastoid air cells are clear. CT CERVICAL SPINE FINDINGS Alignment: Normal. Skull base and vertebrae: No compression fracture or displaced fracture in the cervical spine. No suspicious osseous lesion. Soft tissues and spinal canal: No prevertebral fluid or swelling. No visible canal hematoma. Disc levels: Intervertebral disc spaces are relatively maintained. No high-grade osseous spinal canal stenosis. No high-grade osseous foraminal stenosis. Upper chest: Biapical pleuroparenchymal scarring. Emphysema. Small focus of soft tissue along the left lateral wall of the trachea just above the thoracic inlet which may reflect respiratory secretions. Other: None. IMPRESSION: No CT evidence of acute intracranial abnormality. No acute fracture or traumatic malalignment of the cervical spine. Soft tissue along the lateral wall of the extra thoracic trachea which may reflect respiratory secretions. Recommend correlation for history of aspiration. Electronically Signed   By: Emily Filbert M.D.   On: 04/08/2023 15:32   CT Cervical Spine Wo Contrast Result Date: 04/08/2023 CLINICAL DATA:  Trauma, recurrent falls for the past 2 weeks. Leg weakness, right-sided generalized pain. EXAM: CT HEAD WITHOUT CONTRAST CT CERVICAL SPINE WITHOUT CONTRAST TECHNIQUE: Multidetector CT imaging of the head and cervical spine was performed following the standard protocol without intravenous contrast. Multiplanar CT image reconstructions of the cervical spine were also generated. RADIATION DOSE REDUCTION: This exam was performed according  to the departmental dose-optimization program which includes automated exposure control, adjustment of the mA and/or kV according to patient size and/or use of iterative reconstruction technique. COMPARISON:  MRI head 02/13/2023. FINDINGS: CT HEAD FINDINGS Brain: No acute intracranial hemorrhage. No CT evidence of acute infarct. Focal hypoattenuation in the right corona radiata likely related to demyelinating lesion better evaluated on recent MRI. No edema, mass effect, or midline shift. The basilar cisterns  are patent. Ventricles: The ventricles are normal. Vascular: No hyperdense vessel or unexpected calcification. Skull: No acute or aggressive finding. Orbits: Orbits are symmetric. Sinuses: The visualized paranasal sinuses are clear. Other: Mastoid air cells are clear. CT CERVICAL SPINE FINDINGS Alignment: Normal. Skull base and vertebrae: No compression fracture or displaced fracture in the cervical spine. No suspicious osseous lesion. Soft tissues and spinal canal: No prevertebral fluid or swelling. No visible canal hematoma. Disc levels: Intervertebral disc spaces are relatively maintained. No high-grade osseous spinal canal stenosis. No high-grade osseous foraminal stenosis. Upper chest: Biapical pleuroparenchymal scarring. Emphysema. Small focus of soft tissue along the left lateral wall of the trachea just above the thoracic inlet which may reflect respiratory secretions. Other: None. IMPRESSION: No CT evidence of acute intracranial abnormality. No acute fracture or traumatic malalignment of the cervical spine. Soft tissue along the lateral wall of the extra thoracic trachea which may reflect respiratory secretions. Recommend correlation for history of aspiration. Electronically Signed   By: Emily Filbert M.D.   On: 04/08/2023 15:32   DG Pelvis Portable Result Date: 04/08/2023 CLINICAL DATA:  Pain.  Recent history of falls. EXAM: PORTABLE PELVIS 1-2 VIEWS COMPARISON:  Pelvic radiograph dated 04/07/2022.  FINDINGS: There is no evidence of pelvic fracture or diastasis. Femoral heads are seated within the acetabula. Sacroiliac joints and pubic symphysis are anatomically aligned. IMPRESSION: No acute osseous abnormality. Electronically Signed   By: Hart Robinsons M.D.   On: 04/08/2023 12:50   DG Chest Portable 1 View Result Date: 04/08/2023 CLINICAL DATA:  Pain.  Recent history of falls. EXAM: PORTABLE CHEST 1 VIEW COMPARISON:  Chest radiograph dated 02/14/2023. FINDINGS: The heart size and mediastinal contours are within normal limits. Similar biapical pleuroparenchymal scarring. No focal consolidation, pleural effusion, or pneumothorax. No acute osseous abnormality. IMPRESSION: No acute findings in the chest. Electronically Signed   By: Hart Robinsons M.D.   On: 04/08/2023 12:49    Time coordinating discharge: 45 mins  SIGNED:  Carollee Herter, DO [facility] 04/12/23, 12:45 PM

## 2023-04-12 NOTE — Progress Notes (Signed)
 PROGRESS NOTE    Gabriela Campbell  UXL:[id number] DOB: [birth year] DOA: 04/08/2023 PCP: [facility]  Subjective: Pt seen and examined. Well known to me from prior admissions.  Completing her fifth day of IV Solu-Medrol then discharged home.  Patient feels stable for discharge.  She wants home health PT.   Hospital Course: HPI: Gabriela Campbell is a 58 y.o. female with medical history significant of seasonal allergies, herpes zoster, generalized weakness, polycythemia, vitamin D deficiency, history of tobacco use, history of elevated LFTs, multiple sclerosis who presented to the emergency department with complaints of having multiple falls in the last few days.  She has similar symptoms in February but had benign imaging and was not admitted.  However, given current symptomatology, neurology has recommended high-dose methylprednisolone.  She denied fever, chills, rhinorrhea, sore throat, wheezing or hemoptysis.  No chest pain, palpitations, diaphoresis, PND, orthopnea or pitting edema of the lower extremities.  No abdominal pain, nausea, emesis, diarrhea, constipation, melena or hematochezia.  No flank pain, dysuria, frequency or hematuria.  No polyuria, polydipsia, polyphagia or blurred vision.    ED course: Initial vital signs were temperature 98.6 F, pulse 89, respirations 16, BP 146/95 mmHg and O2 sat 99% on room air.  The patient received oral/parenteral potassium replacement, fentanyl 50 mcg IVP and acetaminophen 650 mg p.o. x 1.  Significant Events: Admitted 04/08/2023 for MS flare   Significant Labs: WBC 10, HgB 16.1, plt 187 Na 140, K 2.9, CO2 of 29, BUN 13, Scr 0.73, glu 92  Significant Imaging Studies: CXR No acute findings in the chest.  Pelvic XR No acute osseous abnormality  CT head, CT c-spine No CT evidence of acute intracranial abnormality. No acute fracture or traumatic malalignment of the cervical spine. Soft tissue along the lateral wall of the extra  thoracic trachea which may reflect respiratory secretions. Recommend correlation for history of aspiration CT L-spine No acute fracture or traumatic malalignment of the lumbar spine   Antibiotic Therapy: Anti-infectives (From admission, onward)    None       Procedures:   Consultants: Neurology    Assessment and Plan: * Multiple sclerosis exacerbation (HCC) 04-08-2023 through 04-11-2023 see documentation by Dr. Nelda Severe 04-12-2023 pt to complete her 5th day of IV solumedrol 1000 mg and DC to home. Neurology saw pt yesterday. No new recs. Pt to f/u with outpatient neurologist.  Relapsing remitting multiple sclerosis (HCC) 04-08-2023 through 04-11-2023 see documentation by Dr. Nelda Severe 04-12-2023 pt to complete her 5th day of IV solumedrol 1000 mg and DC to home. Neurology saw pt yesterday. No new recs. Pt to f/u with outpatient neurologist.  DNR (do not resuscitate) 04-12-2023 stable. Chronic.  Tobacco use 04-12-2023 pt advised to stop smoking.  Polycythemia 04-12-2023 HgB 13.6 on 04-10-2023  Essential hypertension 04-12-2023 stable. Continue norvasc 2.5 mg daily.  Elevated liver enzymes 04-12-2023 AST/ALT elevation has resolved. Alk phos still still elevated but has decreased by 50% since 02-2023   DVT prophylaxis: enoxaparin (LOVENOX) injection 30 mg Start: 04/08/23 2200   Code Status: Limited: Do not attempt resuscitation (DNR) -DNR-LIMITED -Do Not Intubate/DNI  Family Communication: pt is decisional Disposition Plan: return home Reason for continuing need for hospitalization: stable for DC.  Objective: Vitals:   04/11/23 0628 04/11/23 1149 04/11/23 2034 04/12/23 0423  BP:  108/77 105/86 118/77  Pulse:  84 72 67  Resp:  16 16 16   Temp:  98 F (36.7 C) 98.2 F (36.8 C) 97.8 F (36.6 C)  TempSrc:  Oral  SpO2:  100% 99% 100%  Weight: 48.5 kg     Height: 5\' 2"  (1.575 m)      No intake or output data in the 24 hours ending 04/12/23 1240 Filed Weights   04/10/23 0500  04/11/23 0437 04/11/23 0628  Weight: 48.5 kg 48.5 kg 48.5 kg    Examination:  Physical Exam Vitals and nursing note reviewed.  Constitutional:      General: She is not in acute distress.    Appearance: She is not toxic-appearing or diaphoretic.     Comments: Chronically ill appearing  HENT:     Head: Normocephalic and atraumatic.     Nose: Nose normal.  Cardiovascular:     Rate and Rhythm: Normal rate and regular rhythm.  Pulmonary:     Effort: Pulmonary effort is normal.     Breath sounds: Normal breath sounds.  Abdominal:     General: Bowel sounds are normal. There is no distension.     Palpations: Abdomen is soft.  Musculoskeletal:     Right lower leg: No edema.     Left lower leg: No edema.  Skin:    General: Skin is warm and dry.     Capillary Refill: Capillary refill takes less than 2 seconds.  Neurological:     Mental Status: She is alert and oriented to person, place, and time.     Data Reviewed: I have personally reviewed following labs and imaging studies  CBC: Recent Labs  Lab 04/08/23 1213 04/09/23 0540 04/10/23 0407  WBC 10.0 9.9 21.1*  NEUTROABS 6.9  --  19.7*  HGB 16.1* 14.4 13.6  HCT 48.5* 43.9 41.3  MCV 96.8 98.9 98.1  PLT 187 165 170   Basic Metabolic Panel: Recent Labs  Lab 04/08/23 1213 04/08/23 1809 04/09/23 0540 04/10/23 0407  NA 140  --  136 137  K 2.9*  --  4.6 4.4  CL 102  --  108 105  CO2 29  --  20* 23  GLUCOSE 92  --  174* 170*  BUN 13  --  20 25*  CREATININE 0.73  --  0.72 0.65  CALCIUM 9.5  --  9.0 9.1  MG 1.9  --   --   --   PHOS  --  2.9  --   --    GFR: Estimated Creatinine Clearance: 58 mL/min (by C-G formula based on SCr of 0.65 mg/dL). Liver Function Tests: Recent Labs  Lab 04/08/23 1213 04/09/23 0540  AST 16 14*  ALT 15 12  ALKPHOS 257* 217*  BILITOT 0.5 0.5  PROT 8.3* 6.8  ALBUMIN 4.0 3.4*    Recent Labs  Lab 04/08/23 1215  AMMONIA 14   Coagulation Profile: Recent Labs  Lab 04/08/23 1213   INR 0.9   HbA1C: Recent Labs    04/09/23 1732  HGBA1C 5.5   CBG: Recent Labs  Lab 04/11/23 1131 04/11/23 1748 04/11/23 2034 04/12/23 0727 04/12/23 1230  GLUCAP 313* 148* 189* 164* 165*   Scheduled Meds:  amLODipine  2.5 mg Oral Daily   Chlorhexidine Gluconate Cloth  6 each Topical Daily   enoxaparin (LOVENOX) injection  30 mg Subcutaneous Q24H   insulin aspart  0-9 Units Subcutaneous TID WC   lidocaine  1 patch Transdermal Q24H   pantoprazole  40 mg Oral Daily   sodium chloride flush  10-40 mL Intracatheter Q12H   ursodiol  300 mg Oral BID   Continuous Infusions:  methylPREDNISolone (SOLU-MEDROL) injection 1,000 mg (04/12/23 1240)  LOS: 4 days   Time spent: 40 minutes  Carollee Herter, DO  [facility]  04/12/2023, 12:40 PM

## 2023-04-12 NOTE — Progress Notes (Signed)
 [facility] Liaison Note   This patient is enrolled in the Home Based Primary Care program of [employer].   Hospital Liaison Team will follow for disposition.   Please reach out if there are questions or concerns.   Glenna Fellows, BSN, RN, [facility]  [phone]

## 2023-04-12 NOTE — Assessment & Plan Note (Signed)
 04-12-2023 HgB 13.6 on 04-10-2023

## 2023-04-12 NOTE — Assessment & Plan Note (Signed)
 04-12-2023 pt advised to stop smoking.

## 2023-04-12 NOTE — Assessment & Plan Note (Signed)
 04-12-2023 AST/ALT elevation has resolved. Alk phos still still elevated but has decreased by 50% since 02-2023

## 2023-04-12 NOTE — Progress Notes (Signed)
 Occupational Therapy Treatment Patient Details Name: Gabriela Campbell MRN: [id number] DOB: [birth year] Today's Date: 04/12/2023   History of present illness Gabriela Campbell is a 58 yr old female presents with low back pain after multiple falls. PMH: MS, shingles   OT comments  The pt was received seated in the bedside chair. She was motivated to participate in the session and also expressed a readiness to discharge home soon. She performed sit to stand using  a RW and transferring onto and off the standard toilet with modified independence. She reported having much improved pain and improved activity tolerance today. She was instructed on strengthening exercises using an exercise band while seated in the bedside chair. She performed multiple reps and 1 set of lateral pulls, tricep extensions, bicep curls and B LE extensions with supervision. Overall, she has presented with functional progress during her hospital stay and has subsequently met her OT goals. OT will sign off and recommend she return home with home therapy at discharge.        If plan is discharge home, recommend the following:  Help with stairs or ramp for entrance;Assist for transportation   Equipment Recommendations  Other (comment) (N/A)    Recommendations for Other Services      Precautions / Restrictions Restrictions Weight Bearing Restrictions Per Provider Order: No       Mobility Bed Mobility    General bed mobility comments: Pt was received seated in the bedside chair    Transfers Overall transfer level: Modified independent Equipment used: Rolling walker (2 wheels) Transfers: Sit to/from Stand Sit to Stand: Modified independent (Device/Increase time)                 Balance     Sitting balance-Leahy Scale: Good       Standing balance-Leahy Scale: Fair             ADL either performed or assessed with clinical judgement   ADL Overall ADL's : Modified independent;Independent;At  baseline                       Communication Communication Communication: No apparent difficulties   Cognition Arousal: Alert Behavior During Therapy: WFL for tasks assessed/performed Cognition: No apparent impairments             OT - Cognition Comments: Oriented x4, able to follow commands without difficulty    Following commands: Intact                      Pertinent Vitals/ Pain       Pain Assessment Pain Assessment: 0-10 Pain Score: 2  Pain Location: low back and BLE Pain Intervention(s): Monitored during session            Progress Toward Goals  OT Goals(current goals can now be found in the care plan section)  Progress towards OT goals: Goals met/education completed, patient discharged from OT  Acute Rehab OT Goals Patient Stated Goal: to return home today and to have a [employer] and cigarette OT Goal Formulation: All assessment and education complete, DC therapy  Plan         AM-PAC OT "6 Clicks" Daily Activity     Outcome Measure   Help from another person eating meals?: None Help from another person taking care of personal grooming?: None Help from another person toileting, which includes using toliet, bedpan, or urinal?: None Help from another person bathing (including washing, rinsing, drying)?: None  Help from another person to put on and taking off regular upper body clothing?: None Help from another person to put on and taking off regular lower body clothing?: None 6 Click Score: 24    End of Session Equipment Utilized During Treatment: Gait belt  OT Visit Diagnosis: Unsteadiness on feet (R26.81);History of falling (Z91.81);Muscle weakness (generalized) (M62.81)   Activity Tolerance Patient tolerated treatment well   Patient Left in chair;with call bell/phone within reach;with chair alarm set   Nurse Communication Mobility status        Time: 1610-9604 OT Time Calculation (min): 22 min  Charges: OT General  Charges $OT Visit: 1 Visit OT Treatments $Therapeutic Exercise: 8-22 mins   Reuben Likes, OTR/L 04/12/2023, 1:07 PM

## 2023-04-12 NOTE — Assessment & Plan Note (Signed)
 04-12-2023 stable. Chronic.

## 2023-04-12 NOTE — Progress Notes (Signed)
 AVS reviewed w/ pt who verbalized an understanding. No there questions at this time- PT dressing for d/c to home

## 2023-04-12 NOTE — Progress Notes (Signed)
 Physical Therapy Treatment Patient Details Name: Gabriela Campbell MRN: [id number] DOB: 02/12/1965 Today's Date: 04/12/2023   History of Present Illness Gabriela Campbell is a 58 y.o. female presents with low back pain after multiple falls. PMH: MS, shingles    PT Comments  Pt repots 8/10 low back and BLE pain, reports pain medication ~30 minute ago and still waiting for it to fully kick in. Pt motivated to d/c home, agreeable to therapy. Pt amb 80 ft with RW, 1 standing rest break for pain relief. Pt demo step through gait pattern, increased attention for consistent R foot clearance, no overt LOB, maintains body within RW frame. Pt reports dad and friends assist with stairs at baseline, declines stair training at this time.   If plan is discharge home, recommend the following: A little help with walking and/or transfers;A little help with bathing/dressing/bathroom;Assistance with cooking/housework;Assist for transportation;Help with stairs or ramp for entrance   Can travel by private vehicle        Equipment Recommendations  None recommended by PT    Recommendations for Other Services       Precautions / Restrictions Precautions Precautions: Fall Recall of Precautions/Restrictions: Intact Restrictions Weight Bearing Restrictions Per Provider Order: No     Mobility  Bed Mobility Overal bed mobility: Modified Independent             General bed mobility comments: slightly increased time    Transfers Overall transfer level: Modified independent Equipment used: Rolling walker (2 wheels)               General transfer comment: slightly increased time    Ambulation/Gait Ambulation/Gait assistance: Contact guard assist Gait Distance (Feet): 80 Feet Assistive device: Rolling walker (2 wheels) Gait Pattern/deviations: Step-to pattern, Decreased stride length, Trunk flexed, Narrow base of support Gait velocity: decreased     General Gait Details: step through  gait pattern, increased attention for consistent R foot clearance, good steadiness without LOB, increased time with turns, maintains body within RW frame appropriately, 1 standing rest break for pain relief   Stairs             Wheelchair Mobility     Tilt Bed    Modified Rankin (Stroke Patients Only)       Balance Overall balance assessment: Needs assistance, History of Falls Sitting-balance support: Feet supported, No upper extremity supported Sitting balance-Leahy Scale: Normal     Standing balance support: During functional activity Standing balance-Leahy Scale: Fair Standing balance comment: static fair, dynamic with AD                            Communication Communication Communication: No apparent difficulties  Cognition Arousal: Alert Behavior During Therapy: WFL for tasks assessed/performed   PT - Cognitive impairments: No apparent impairments                         Following commands: Intact      Cueing    Exercises      General Comments        Pertinent Vitals/Pain Pain Assessment Pain Assessment: 0-10 Pain Score: 8  Pain Location: low back and BLE Pain Descriptors / Indicators: Discomfort, Guarding Pain Intervention(s): Limited activity within patient's tolerance, Monitored during session    Home Living  Prior Function            PT Goals (current goals can now be found in the care plan section) Acute Rehab PT Goals Patient Stated Goal: agreeable to therapy PT Goal Formulation: With patient Time For Goal Achievement: 04/23/23 Potential to Achieve Goals: Good Progress towards PT goals: Progressing toward goals    Frequency    Min 3X/week      PT Plan      Co-evaluation              AM-PAC PT "6 Clicks" Mobility   Outcome Measure  Help needed turning from your back to your side while in a flat bed without using bedrails?: None Help needed moving from  lying on your back to sitting on the side of a flat bed without using bedrails?: None Help needed moving to and from a bed to a chair (including a wheelchair)?: A Little Help needed standing up from a chair using your arms (e.g., wheelchair or bedside chair)?: None Help needed to walk in hospital room?: A Little Help needed climbing 3-5 steps with a railing? : A Little 6 Click Score: 21    End of Session Equipment Utilized During Treatment: Gait belt Activity Tolerance: Patient tolerated treatment well Patient left: in bed;with call bell/phone within reach;with bed alarm set Nurse Communication: Mobility status PT Visit Diagnosis: Unsteadiness on feet (R26.81);Other abnormalities of gait and mobility (R26.89);Muscle weakness (generalized) (M62.81);History of falling (Z91.81)     Time: 1610-9604 PT Time Calculation (min) (ACUTE ONLY): 19 min  Charges:    $Gait Training: 8-22 mins PT General Charges $$ ACUTE PT VISIT: 1 Visit                     Tori Len Azeez PT, DPT 04/12/23, 9:38 AM

## 2023-05-04 ENCOUNTER — Emergency Department (HOSPITAL_COMMUNITY)

## 2023-05-04 ENCOUNTER — Encounter (HOSPITAL_COMMUNITY): Payer: Self-pay | Admitting: Internal Medicine

## 2023-05-04 ENCOUNTER — Observation Stay (HOSPITAL_COMMUNITY)
Admission: EM | Admit: 2023-05-04 | Discharge: 2023-05-05 | Disposition: A | Discharge status: Home Health | Attending: Family Medicine | Admitting: Family Medicine

## 2023-05-04 ENCOUNTER — Other Ambulatory Visit: Payer: Self-pay

## 2023-05-04 ENCOUNTER — Inpatient Hospital Stay (HOSPITAL_COMMUNITY)

## 2023-05-04 DIAGNOSIS — Z8669 Personal history of other diseases of the nervous system and sense organs: Secondary | ICD-10-CM | POA: Diagnosis not present

## 2023-05-04 DIAGNOSIS — F17210 Nicotine dependence, cigarettes, uncomplicated: Secondary | ICD-10-CM | POA: Diagnosis not present

## 2023-05-04 DIAGNOSIS — Z79899 Other long term (current) drug therapy: Secondary | ICD-10-CM | POA: Insufficient documentation

## 2023-05-04 DIAGNOSIS — I10 Essential (primary) hypertension: Secondary | ICD-10-CM | POA: Diagnosis not present

## 2023-05-04 DIAGNOSIS — D751 Secondary polycythemia: Secondary | ICD-10-CM | POA: Insufficient documentation

## 2023-05-04 DIAGNOSIS — R531 Weakness: Secondary | ICD-10-CM | POA: Diagnosis present

## 2023-05-04 DIAGNOSIS — D72829 Elevated white blood cell count, unspecified: Secondary | ICD-10-CM | POA: Diagnosis not present

## 2023-05-04 DIAGNOSIS — R260 Ataxic gait: Secondary | ICD-10-CM | POA: Diagnosis not present

## 2023-05-04 DIAGNOSIS — G35 Multiple sclerosis: Secondary | ICD-10-CM | POA: Insufficient documentation

## 2023-05-04 DIAGNOSIS — M545 Low back pain, unspecified: Secondary | ICD-10-CM | POA: Insufficient documentation

## 2023-05-04 DIAGNOSIS — R29898 Other symptoms and signs involving the musculoskeletal system: Principal | ICD-10-CM

## 2023-05-04 DIAGNOSIS — R262 Difficulty in walking, not elsewhere classified: Secondary | ICD-10-CM

## 2023-05-04 DIAGNOSIS — E876 Hypokalemia: Secondary | ICD-10-CM | POA: Insufficient documentation

## 2023-05-04 DIAGNOSIS — G8929 Other chronic pain: Secondary | ICD-10-CM | POA: Diagnosis not present

## 2023-05-04 HISTORY — DX: Essential (primary) hypertension: I10

## 2023-05-04 LAB — CBC WITH DIFFERENTIAL/PLATELET
Abs Immature Granulocytes: 0.04 10*3/uL (ref 0.00–0.07)
Basophils Absolute: 0 10*3/uL (ref 0.0–0.1)
Basophils Relative: 0 %
Eosinophils Absolute: 0 10*3/uL (ref 0.0–0.5)
Eosinophils Relative: 0 %
HCT: 48.6 % — ABNORMAL HIGH (ref 36.0–46.0)
Hemoglobin: 17 g/dL — ABNORMAL HIGH (ref 12.0–15.0)
Immature Granulocytes: 0 %
Lymphocytes Relative: 25 %
Lymphs Abs: 2.9 10*3/uL (ref 0.7–4.0)
MCH: 32.7 pg (ref 26.0–34.0)
MCHC: 35 g/dL (ref 30.0–36.0)
MCV: 93.5 fL (ref 80.0–100.0)
Monocytes Absolute: 0.6 10*3/uL (ref 0.1–1.0)
Monocytes Relative: 5 %
Neutro Abs: 8.1 10*3/uL — ABNORMAL HIGH (ref 1.7–7.7)
Neutrophils Relative %: 70 %
Platelets: 296 10*3/uL (ref 150–400)
RBC: 5.2 MIL/uL — ABNORMAL HIGH (ref 3.87–5.11)
RDW: 13 % (ref 11.5–15.5)
WBC: 11.7 10*3/uL — ABNORMAL HIGH (ref 4.0–10.5)
nRBC: 0 % (ref 0.0–0.2)

## 2023-05-04 LAB — BASIC METABOLIC PANEL WITH GFR
Anion gap: 12 (ref 5–15)
BUN: 12 mg/dL (ref 6–20)
CO2: 28 mmol/L (ref 22–32)
Calcium: 9.9 mg/dL (ref 8.9–10.3)
Chloride: 100 mmol/L (ref 98–111)
Creatinine, Ser: 0.69 mg/dL (ref 0.44–1.00)
GFR, Estimated: >60 mL/min (ref >60)
Glucose, Bld: 115 mg/dL — ABNORMAL HIGH (ref 70–99)
Potassium: 3 mmol/L — ABNORMAL LOW (ref 3.5–5.1)
Sodium: 140 mmol/L (ref 135–145)

## 2023-05-04 LAB — MAGNESIUM: Magnesium: 2 mg/dL (ref 1.7–2.4)

## 2023-05-04 MED ORDER — URSODIOL 300 MG PO CAPS
600.0000 mg | ORAL_CAPSULE | Freq: Every day | ORAL | Status: DC
  Administered 2023-05-05: 600 mg via ORAL
  Filled 2023-05-04: qty 2

## 2023-05-04 MED ORDER — AMLODIPINE BESYLATE 5 MG PO TABS
2.5000 mg | ORAL_TABLET | Freq: Every day | ORAL | Status: DC
  Administered 2023-05-05: 2.5 mg via ORAL
  Filled 2023-05-04: qty 1

## 2023-05-04 MED ORDER — OXYCODONE HCL 5 MG PO TABS
5.0000 mg | ORAL_TABLET | ORAL | Status: DC | PRN
  Administered 2023-05-04 – 2023-05-05 (×4): 5 mg via ORAL
  Filled 2023-05-04 (×4): qty 1

## 2023-05-04 MED ORDER — LORAZEPAM 2 MG/ML IJ SOLN
1.0000 mg | Freq: Once | INTRAMUSCULAR | Status: AC
  Administered 2023-05-04: 1 mg via INTRAVENOUS
  Filled 2023-05-04: qty 1

## 2023-05-04 MED ORDER — ACETAMINOPHEN 325 MG PO TABS
650.0000 mg | ORAL_TABLET | Freq: Four times a day (QID) | ORAL | Status: DC | PRN
  Administered 2023-05-05: 650 mg via ORAL
  Filled 2023-05-04: qty 2

## 2023-05-04 MED ORDER — POTASSIUM CHLORIDE CRYS ER 20 MEQ PO TBCR
40.0000 meq | EXTENDED_RELEASE_TABLET | Freq: Once | ORAL | Status: AC
  Administered 2023-05-04: 40 meq via ORAL
  Filled 2023-05-04: qty 2

## 2023-05-04 MED ORDER — OXYCODONE HCL 5 MG PO TABS
5.0000 mg | ORAL_TABLET | Freq: Once | ORAL | Status: AC
  Administered 2023-05-04: 5 mg via ORAL
  Filled 2023-05-04: qty 1

## 2023-05-04 MED ORDER — CYCLOBENZAPRINE HCL 10 MG PO TABS
10.0000 mg | ORAL_TABLET | Freq: Two times a day (BID) | ORAL | Status: DC | PRN
  Administered 2023-05-05: 10 mg via ORAL
  Filled 2023-05-04: qty 1

## 2023-05-04 MED ORDER — ACETAMINOPHEN 650 MG RE SUPP: 650.0000 mg | Freq: Four times a day (QID) | RECTAL | Status: DC | PRN

## 2023-05-04 MED ORDER — ONDANSETRON HCL 4 MG/2ML IJ SOLN: 4.0000 mg | Freq: Four times a day (QID) | INTRAMUSCULAR | Status: DC | PRN

## 2023-05-04 MED ORDER — MELATONIN 3 MG PO TABS: 3.0000 mg | ORAL_TABLET | Freq: Every evening | ORAL | Status: DC | PRN

## 2023-05-04 MED ORDER — FENTANYL CITRATE PF 50 MCG/ML IJ SOSY
25.0000 ug | PREFILLED_SYRINGE | Freq: Once | INTRAMUSCULAR | Status: AC
  Administered 2023-05-04: 25 ug via INTRAVENOUS
  Filled 2023-05-04: qty 1

## 2023-05-04 NOTE — ED Notes (Signed)
 Pt pressed the call button and staff found the pt on the floor. Pt states " I got out of the bed and ended up on the floor". Staff noted the pt had non-slip socks on. Pt denied any new injury/pain after the fall. Staff assisted the pt of the floor and back into the bed.

## 2023-05-04 NOTE — Consult Note (Addendum)
 NEUROLOGY CONSULT NOTE   Date of service: May 04, 2023 Patient Name: Gabriela Campbell MRN:  [id number] DOB:  [birth year] Chief Complaint: "Difficulty walking, history of MS" Requesting Provider: Roxana Copier, DO, [facility]  History of Present Illness  Gabriela Campbell is a 58 y.o. female with hx of relapsing remitting MS on no modifying therapy due to deranged renal function on Tysabri -not put on any other medication after that-follows with [facility] neurology, coming in for an MS relapse. She reports that over the past few weeks to over 3 months, she has been having a difficult time walking.  She was admitted early April in the hospital and given 5 days of steroids which did help her some but she again is having trouble walking and reports weakness that is generalized in the lower extremity weakness that is essentially more trouble with walking due to pain. She does not feel that she is weak on one side of the body versus the other but feels that her ability to walk with a walker has been getting worse now over a few months. Case was discussed with the on-call daytime neurologist who had recommended spine imaging but for some reason only MR brain without contrast was done-unremarkable. Her prior brain and spinal cord imaging done for similar complaints have been unrevealing for enhancing lesions.    ROS  Comprehensive ROS performed and pertinent positives documented in HPI   Past History   Past Medical History:  Diagnosis Date   Allergy    Essential hypertension    Multiple sclerosis (HCC)    Shingles    Weakness     Past Surgical History:  Procedure Laterality Date   ABDOMINAL HYSTERECTOMY     CHOLECYSTECTOMY      Family History: Family History  Problem Relation Age of Onset   Cancer Paternal Grandmother    Cancer Paternal Aunt    Cancer Paternal Aunt    Colon cancer Neg Hx    Stomach cancer Neg Hx    Esophageal cancer Neg Hx    Pancreatic cancer Neg Hx      Social History  reports that she has been smoking cigarettes. She has never used smokeless tobacco. She reports that she does not drink alcohol and does not use drugs.  Allergies  Allergen Reactions   Dilaudid [Hydromorphone Hcl] Anaphylaxis   Fish Allergy Anaphylaxis   Ibuprofen Anaphylaxis   Iodine Anaphylaxis   Penicillins Anaphylaxis   Shellfish Allergy Anaphylaxis   Shrimp Extract Anaphylaxis   Tramadol  Nausea And Vomiting and Anaphylaxis   Baclofen  Other (See Comments)    Makes the patient feel wobbly   Gabapentin  Other (See Comments)    GI upset   Codeine Nausea Only    Medications   Current Facility-Administered Medications:    acetaminophen  (TYLENOL ) tablet 650 mg, 650 mg, Oral, Q6H PRN **OR** acetaminophen  (TYLENOL ) suppository 650 mg, 650 mg, Rectal, Q6H PRN, Howerter, Justin B, DO   [START ON 05/05/2023] amLODipine  (NORVASC ) tablet 2.5 mg, 2.5 mg, Oral, Daily, Howerter, Justin B, DO   cyclobenzaprine  (FLEXERIL ) tablet 10 mg, 10 mg, Oral, BID PRN, Howerter, Justin B, DO   melatonin tablet 3 mg, 3 mg, Oral, QHS PRN, Howerter, Justin B, DO   ondansetron  (ZOFRAN ) injection 4 mg, 4 mg, Intravenous, Q6H PRN, Howerter, Justin B, DO   potassium chloride  SA (KLOR-CON  M) CR tablet 40 mEq, 40 mEq, Oral, Once, Howerter, Justin B, DO   [START ON 05/05/2023] ursodiol  (ACTIGALL ) capsule 600 mg, 600  mg, Oral, Daily, Howerter, Justin B, DO  Vitals   Vitals:   May 07, 2023 1350 05/07/23 1353 05/07/23 1700 05/07/23 1800  BP:  (!) 141/90 (!) 129/92 134/88  Pulse:  94 81 83  Resp:  16 17 17   Temp: 98.5 F (36.9 C)  98.6 F (37 C)   TempSrc: Oral  Oral   SpO2:  100% 100% 97%    There is no height or weight on file to calculate BMI.  Physical Exam  General: Awake alert in no distress HEENT: Normocephalic atraumatic Lungs: Clear Neurological exam Awake alert oriented x 3.  No aphasia.  No dysarthria Cranial nerves II to XII intact Motor examination with bilateral lower  extremity weakness 4 -/5 with some component of effort dependent weakness as well as pain precluding a good exam.  She also has bilateral upper extremity weakness with nearly 4/5 strength in both upper extremities. Sensation is somewhat diminished on the right which is unchanged from her baseline. Coordination: She is ataxic on both upper extremities and difficult to perform coordination check on the lower extremities due to pain and weakness.  Labs/Imaging/Neurodiagnostic studies   CBC:  Recent Labs  Lab 05/07/2023 1448  WBC 11.7*  NEUTROABS 8.1*  HGB 17.0*  HCT 48.6*  MCV 93.5  PLT 296   Basic Metabolic Panel:  Lab Results  Component Value Date   NA 140 05-07-23   K 3.0 (L) 2023/05/07   CO2 28 07-May-2023   GLUCOSE 115 (H) 05/07/2023   BUN 12 May 07, 2023   CREATININE 0.69 May 07, 2023   CALCIUM 9.9 May 07, 2023   GFRNONAA >60 05-07-23   GFRAA 85 11/24/2019   HgbA1c:  Lab Results  Component Value Date   HGBA1C 5.5 04/09/2023   Urine Drug Screen:     Component Value Date/Time   LABOPIA POSITIVE (A) 07/28/2022 1615   COCAINSCRNUR NONE DETECTED 07/28/2022 1615   LABBENZ POSITIVE (A) 07/28/2022 1615   AMPHETMU NONE DETECTED 07/28/2022 1615   THCU NONE DETECTED 07/28/2022 1615   LABBARB NONE DETECTED 07/28/2022 1615    INR  Lab Results  Component Value Date   INR 0.9 04/08/2023   MRI Brain-done without contrast (Personally reviewed): No acute findings   ASSESSMENT   Gabriela Campbell is a 58 y.o. female past history of multiple sclerosis not on disease modifying therapy with worsening gait over the past few months.  Gait ataxia/incoordination reported as well as generalized weakness worse in the legs than arms with a lot of leg pain seems to be the mix of reasons why she is having the symptoms. She received 5 days of steroids less than a month ago.  At this time I do not think this is a true MS exacerbation-I suspect that she is gradually becoming worse and her  relapsing remitting course might be becoming more secondary progressive. That said, at this point, she does require some therapy and other evaluations for which she will be staying in the hospital. I would recommend checking for any systemic infections causing pseudo exacerbation.  Impression: History of multiple sclerosis, worsening gait and weakness, less likely a true exacerbation, could be a pseudo exacerbation of symptoms in the setting of an acute infection.  RECOMMENDATIONS  She does need therapy assessments due to her gait instability and weakness. I would recommend checking a urinalysis.  If negative, give her 1 dose of Solu-Medrol  1000 mg IV x 1.  I discussed with her that we are not at that point after her last 5 doses  to give her another round of 3 to 5 days as there are serious risk factors associated with steroids and she is amenable. She needs to follow again with outpatient neurology t for discussions about disease modifying treatment.  Patient of [facility] neurolog[facility]-needs follow-up in 2 to 4 weeks after discharge. She needs better pain control-does not remember being on any medications for pain.  Given her liver function abnormalities, choices remain limited.  I would recommend trying low-dose gabapentin  to start-100 mg 3 times daily and have the outpatient clinic manage her pain and stiffness as well.  Plan was discussed with Ariel PA-C in the ED.  Plan was relayed via secure chat to Dr. Brock Canner   Inpatient neurology will be available as needed.  Please call with questions ______________________________________________________________________    Signed, Tona Francis, MD [facility]

## 2023-05-04 NOTE — ED Triage Notes (Signed)
 Patient BIB EMS for bilateral lower leg weakness and pain. Patient states she is having an MS flare. Patient was in hospital last month due to MS flare.

## 2023-05-04 NOTE — ED Provider Notes (Signed)
 Accepted handoff at shift change from Barrett, PA-C. Please see prior provider note for more detail.   Briefly: Patient is 58 y.o.   DDX: concern for MS flare, epidural abscess, cauda equina syndrome  Plan: Consult with neurology for imaging recommendations, likely to need admission for IV steroids given symptoms consistent with prior MS flares.   Physical Exam  BP (!) 142/91 (BP Location: Left Arm)   Pulse 91   Temp 97.8 F (36.6 C) (Oral)   Resp 18   SpO2 98%   Physical Exam Vitals and nursing note reviewed.  Constitutional:      General: She is not in acute distress.    Appearance: She is not toxic-appearing.  HENT:     Head: Normocephalic and atraumatic.  Eyes:     General: No scleral icterus.    Conjunctiva/sclera: Conjunctivae normal.  Cardiovascular:     Rate and Rhythm: Normal rate and regular rhythm.     Pulses: Normal pulses.     Heart sounds: Normal heart sounds.  Pulmonary:     Effort: Pulmonary effort is normal. No respiratory distress.     Breath sounds: Normal breath sounds.  Abdominal:     General: Abdomen is flat. Bowel sounds are normal.     Palpations: Abdomen is soft.     Tenderness: There is no abdominal tenderness.  Skin:    General: Skin is warm and dry.     Findings: No lesion.  Neurological:     General: No focal deficit present.     Mental Status: She is alert and oriented to person, place, and time. Mental status is at baseline.     Comments: Global weakness present in bilateral upper and lower extremities.  Lower extremities are unable to lift against gravity.  Upper extremities appear to show some level of discoordination with some diminished ability with fine motor skills with pincer grasping.     Procedures  Procedures  ED Course / MDM    Medical Decision Making Amount and/or Complexity of Data Reviewed Labs: ordered. Radiology: ordered.  Risk Prescription drug management. Decision regarding hospitalization.   Patient's MRI  of the brain was negative.  At some point, MRI imaging was changed by the radiology tech to be without contrast without my knowledge.  Remainder of MRI imaging of the spine was unable to be obtained due to patient becoming anxious and the MRI machine even after administration of 4 mg of Ativan .  Will consult with neurology given limited evaluation with only MR of the brain available.  Spoke with Dr. Bonnita Buttner, neurologist, who advised that he would likely require to evaluate the patient for concerns regarding possible MS flare.  Given the patient had received multiple rounds of IV steroids while admitted about 1 month ago, did not feel the patient would benefit from IV steroids at this time.  Spoke with Dr. Brock Canner, hospitalist, who will be admitting patient.       Chandlar Guice A, PA-C 05/04/23 2349    Merdis Stalling, MD 05/07/23 [phone]

## 2023-05-04 NOTE — H&P (Signed)
 History and Physical      Gabriela Campbell:[id number] DOB: Jun 29, 1965 DOA: 05/04/2023; DOS: 05/04/2023  PCP: [facility] *** Patient coming from: home ***  I have personally briefly reviewed patient's old medical records in [facility]  Chief Complaint: ***  HPI: Gabriela Campbell is a 58 y.o. female with medical history significant for *** who is admitted to [facility] on 05/04/2023 with *** after presenting from home*** to [facility] ED complaining of ***.   ***        ***  ED Course:  Vital signs in the ED were notable for the following: ***  Labs were notable for the following: ***  Per my interpretation, EKG in ED demonstrated the following:  ***  Imaging in the ED, per corresponding formal radiology read, was notable for the following: ***  EDP discussed patient's case with on-call neurology, Dr. Bonnita Buttner, who conveyed that it is okay for the patient to remain at [facility].  Neurology to evaluate pt in person and will formally consult, with additional recommendations pending at this time.  In the meantime, Dr. Bonnita Buttner requests that we not initiate systemic corticosteroids given the close proximity to the course of steroids she received for recent MS flare during previous hospitalization.  While in the ED, the following were administered: ***  Subsequently, the patient was admitted  ***  ***red   Review of Systems: As per HPI otherwise 10 point review of systems negative.   Past Medical History:  Diagnosis Date   Allergy    Essential hypertension    Multiple sclerosis (HCC)    Shingles    Weakness     Past Surgical History:  Procedure Laterality Date   ABDOMINAL HYSTERECTOMY     CHOLECYSTECTOMY      Social History:  reports that she has been smoking cigarettes. She has never used smokeless tobacco. She reports that she does not drink alcohol and does not use drugs.   Allergies  Allergen Reactions   Dilaudid [Hydromorphone Hcl]  Anaphylaxis   Fish Allergy Anaphylaxis   Ibuprofen Anaphylaxis   Iodine Anaphylaxis   Penicillins Anaphylaxis   Shellfish Allergy Anaphylaxis   Shrimp Extract Anaphylaxis   Tramadol  Nausea And Vomiting and Anaphylaxis   Baclofen  Other (See Comments)    Makes the patient feel wobbly   Gabapentin  Other (See Comments)    GI upset   Codeine Nausea Only    Family History  Problem Relation Age of Onset   Cancer Paternal Grandmother    Cancer Paternal Aunt    Cancer Paternal Aunt    Colon cancer Neg Hx    Stomach cancer Neg Hx    Esophageal cancer Neg Hx    Pancreatic cancer Neg Hx     Family history reviewed and not pertinent ***   Prior to Admission medications   Medication Sig Start Date End Date Taking? Authorizing Provider  amLODipine  (NORVASC ) 5 MG tablet Take 0.5 tablets (2.5 mg total) by mouth daily. Patient taking differently: Take 5 mg by mouth in the morning. 02/14/23   Unk Garb, DO  Cholecalciferol  (VITAMIN D3) 25 MCG (1000 UT) CAPS Take 2,000 Units by mouth daily.    [provider]  Cyanocobalamin  (VITAMIN B12 PO) Take 1 tablet by mouth daily.    [provider]  cyclobenzaprine  (FLEXERIL ) 10 MG tablet Take 1 tablet (10 mg total) by mouth 2 (two) times daily as needed for muscle spasms. Patient taking differently: Take 10 mg by mouth  3 (three) times daily as needed for muscle spasms. 10/03/22   Almond Army, MD  Oxycodone  HCl 10 MG TABS Take 0.5 tablets (5 mg total) by mouth every 4 (four) hours as needed for up to 7 days. 04/12/23 04/19/23  Unk Garb, DO  ursodiol  (ACTIGALL ) 300 MG capsule Take 600 mg by mouth in the morning. 02/27/23 03/05/24  [provider]     Objective    Physical Exam: Vitals:   05/04/23 1350 05/04/23 1353 05/04/23 1700 05/04/23 1800  BP:  (!) 141/90 (!) 129/92 134/88  Pulse:  94 81 83  Resp:  16 17 17   Temp: 98.5 F (36.9 C)  98.6 F (37 C)   TempSrc: Oral  Oral   SpO2:  100% 100% 97%    General:  appears to be stated age; alert, oriented Skin: warm, dry, no rash Head:  AT/[place] Mouth:  Oral mucosa membranes appear moist, normal dentition Neck: supple; trachea midline Heart:  RRR; did not appreciate any M/R/G Lungs: CTAB, did not appreciate any wheezes, rales, or rhonchi Abdomen: + BS; soft, ND, NT Vascular: 2+ pedal pulses b/l; 2+ radial pulses b/l Extremities: no peripheral edema, no muscle wasting Neuro: strength and sensation intact in upper and lower extremities b/l    *** Neuro: 5/5 strength of the proximal and distal flexors and extensors of the upper and lower extremities bilaterally; sensation intact in upper and lower extremities b/l; cranial nerves II through XII grossly intact; no pronator drift; no evidence suggestive of slurred speech, dysarthria, or facial droop; Normal muscle tone. No tremors. *** Neuro: In the setting of the patient's current mental status and associated inability to follow instructions, unable to perform full neurologic exam at this time.  As such, assessment of strength, sensation, and cranial nerves is limited at this time. Patient noted to spontaneously move all 4 extremities. No tremors.  ***    Labs on Admission: I have personally reviewed following labs and imaging studies  CBC: Recent Labs  Lab 05/04/23 1448  WBC 11.7*  NEUTROABS 8.1*  HGB 17.0*  HCT 48.6*  MCV 93.5  PLT 296   Basic Metabolic Panel: Recent Labs  Lab 05/04/23 1448  NA 140  K 3.0*  CL 100  CO2 28  GLUCOSE 115*  BUN 12  CREATININE 0.69  CALCIUM 9.9  MG 2.0   GFR: CrCl cannot be calculated (Unknown ideal weight.). Liver Function Tests: No results for input(s): "AST", "ALT", "ALKPHOS", "BILITOT", "PROT", "ALBUMIN" in the last 168 hours. No results for input(s): "LIPASE", "AMYLASE" in the last 168 hours. No results for input(s): "AMMONIA" in the last 168 hours. Coagulation Profile: No results for input(s): "INR", "PROTIME" in the last 168 hours. Cardiac  Enzymes: No results for input(s): "CKTOTAL", "CKMB", "CKMBINDEX", "TROPONINI" in the last 168 hours. BNP (last 3 results) No results for input(s): "PROBNP" in the last 8760 hours. HbA1C: No results for input(s): "HGBA1C" in the last 72 hours. CBG: No results for input(s): "GLUCAP" in the last 168 hours. Lipid Profile: No results for input(s): "CHOL", "HDL", "LDLCALC", "TRIG", "CHOLHDL", "LDLDIRECT" in the last 72 hours. Thyroid  Function Tests: No results for input(s): "TSH", "T4TOTAL", "FREET4", "T3FREE", "THYROIDAB" in the last 72 hours. Anemia Panel: No results for input(s): "VITAMINB12", "FOLATE", "FERRITIN", "TIBC", "IRON", "RETICCTPCT" in the last 72 hours. Urine analysis:    Component Value Date/Time   COLORURINE YELLOW 04/08/2023 1515   APPEARANCEUR CLOUDY (A) 04/08/2023 1515   LABSPEC 1.014 04/08/2023 1515   PHURINE 7.0 04/08/2023 1515  GLUCOSEU NEGATIVE 04/08/2023 1515   HGBUR NEGATIVE 04/08/2023 1515   BILIRUBINUR NEGATIVE 04/08/2023 1515   KETONESUR NEGATIVE 04/08/2023 1515   PROTEINUR 100 (A) 04/08/2023 1515   NITRITE NEGATIVE 04/08/2023 1515   LEUKOCYTESUR NEGATIVE 04/08/2023 1515    Radiological Exams on Admission: MR BRAIN WO CONTRAST Result Date: 05/04/2023 CLINICAL DATA:  Neuro deficit, acute, stroke suspected EXAM: MRI HEAD WITHOUT CONTRAST TECHNIQUE: Multiplanar, multiecho pulse sequences of the brain and surrounding structures were obtained without intravenous contrast. COMPARISON:  CT head April 7, 25.  MRI head February 13, 2023. FINDINGS: Motion limited study. Brain: No acute infarction, hemorrhage, hydrocephalus, extra-axial collection or mass lesion. Similar scattered T2/FLAIR hyperintensity in the white matter, compatible with chronic demyelination. The absence of contrast precludes evaluation for enhancing lesions. Vascular: Major arterial flow voids are maintained at the skull base. Skull and upper cervical spine: Normal marrow signal. Sinuses/Orbits: Clear  sinuses.  No acute orbital findings. IMPRESSION: No evidence of acute intracranial abnormality. Similar white matter disease, compatible with chronic demyelination. Electronically Signed   By: Stevenson Elbe M.D.   On: 05/04/2023 20:03      Assessment/Plan    Principal Problem:   Generalized weakness  ***        ***                  ***                  ***                  ***                  ***                 ***                  ***                  ***                  ***                  ***                  ***                  ***                 ***     DVT prophylaxis: SCD's ***  Code Status: Full code*** Family Communication: none*** Disposition Plan: Per Rounding Team Consults called: EDP discussed patient's case with on-call neurology, Dr. Bonnita Buttner, who conveyed that it is okay for the patient to remain at [facility].  Neurology to evaluate pt in person and will formally consult.;  Admission status: ***    I SPENT GREATER THAN 75 *** MINUTES IN CLINICAL CARE TIME/MEDICAL DECISION-MAKING IN COMPLETING THIS ADMISSION.     Gattis Kass Revonda Menter DO [facility] From 7PM - 7AM   05/04/2023, 9:58 PM   ***

## 2023-05-04 NOTE — Plan of Care (Signed)
  Problem: Education: Goal: Knowledge of General Education information will improve Description: Including pain rating scale, medication(s)/side effects and non-pharmacologic comfort measures Outcome: Progressing   Problem: Elimination: Goal: Will not experience complications related to bowel motility Outcome: Progressing Goal: Will not experience complications related to urinary retention Outcome: Progressing   Problem: Pain Managment: Goal: General experience of comfort will improve and/or be controlled Outcome: Progressing   Problem: Safety: Goal: Ability to remain free from injury will improve Outcome: Progressing   Problem: Skin Integrity: Goal: Risk for impaired skin integrity will decrease Outcome: Progressing

## 2023-05-04 NOTE — ED Provider Notes (Signed)
 I provided a substantive portion of the care of this patient.  I personally made/approved the management plan for this patient and take responsibility for the patient management.   46 female with history of multiple sclerosis presents with pain and weakness to her lower extremities.  Similar to her prior MS flares in the past.  No bowel or bladder dysfunction.  Patient uses a walker at baseline.  Plan will be for IV steroids, pain medication and inpatient mission.  Will consult neurology   Lind Repine, MD 05/04/23 1454

## 2023-05-04 NOTE — ED Provider Notes (Signed)
 [place] EMERGENCY DEPARTMENT AT [facility] Provider Note   CSN: [id number] Arrival date & time: 05/04/23  1343     History  No chief complaint on file.   Gabriela Campbell is a 58 y.o. female.  With past medical history of bilateral lower extremity leg weakness, relapsing remitting multiple sclerosis, elevated liver enzymes, hereditary hemochromatosis presenting with bilateral lower extremity weakness.  She had a feels this is like prior MS flareup.  She has normal being able to ambulate with cane.  At as of yesterday evening she has had significant progressive weakness.  She is not able to stand up or ambulate.  She has no loss of bowel or bladder.  She has no headache or pain with eye range of motion or change in vision.  She reports normally when she gets like this she needs to be admitted for IV steroids. Dose have low thoracic and mid lumbar tenderness however this is not acutely worsened since last imaging.  HPI     Home Medications Prior to Admission medications   Medication Sig Start Date End Date Taking? Authorizing Provider  amLODipine  (NORVASC ) 5 MG tablet Take 0.5 tablets (2.5 mg total) by mouth daily. Patient taking differently: Take 5 mg by mouth in the morning. 02/14/23   Unk Garb, DO  Cholecalciferol  (VITAMIN D3) 25 MCG (1000 UT) CAPS Take 2,000 Units by mouth daily.    [provider]  Cyanocobalamin  (VITAMIN B12 PO) Take 1 tablet by mouth daily.    [provider]  cyclobenzaprine  (FLEXERIL ) 10 MG tablet Take 1 tablet (10 mg total) by mouth 2 (two) times daily as needed for muscle spasms. Patient taking differently: Take 10 mg by mouth 3 (three) times daily as needed for muscle spasms. 10/03/22   Almond Army, MD  Oxycodone  HCl 10 MG TABS Take 0.5 tablets (5 mg total) by mouth every 4 (four) hours as needed for up to 7 days. 04/12/23 04/19/23  Unk Garb, DO  ursodiol  (ACTIGALL ) 300 MG capsule Take 600 mg by mouth in the morning.  02/27/23 03/05/24  [provider]      Allergies    Dilaudid [hydromorphone hcl], Fish allergy, Ibuprofen, Iodine, Penicillins, Shellfish allergy, Shrimp extract, Tramadol , Baclofen , Gabapentin , and Codeine    Review of Systems   Review of Systems  Musculoskeletal:  Positive for back pain.    Physical Exam Updated Vital Signs BP (!) 141/90 (BP Location: Left Arm)   Pulse 94   Temp 98.5 F (36.9 C) (Oral)   Resp 16   SpO2 100%  Physical Exam Vitals and nursing note reviewed.  Constitutional:      General: She is not in acute distress.    Appearance: She is not toxic-appearing.  HENT:     Head: Normocephalic and atraumatic.  Eyes:     General: No scleral icterus.    Conjunctiva/sclera: Conjunctivae normal.  Cardiovascular:     Rate and Rhythm: Normal rate and regular rhythm.     Pulses: Normal pulses.     Heart sounds: Normal heart sounds.  Pulmonary:     Effort: Pulmonary effort is normal. No respiratory distress.     Breath sounds: Normal breath sounds.  Abdominal:     General: Abdomen is flat. Bowel sounds are normal.     Palpations: Abdomen is soft.     Tenderness: There is no abdominal tenderness.  Musculoskeletal:     Comments: Global lower extremity weakness.  Very difficult for patient to and hold  leg against gravity bilaterally. Sensation and dorsal pedal pulse intact.   Skin:    General: Skin is warm and dry.     Findings: No lesion.  Neurological:     General: No focal deficit present.     Mental Status: She is alert and oriented to person, place, and time. Mental status is at baseline.     ED Results / Procedures / Treatments   Labs (all labs ordered are listed, but only abnormal results are displayed) Labs Reviewed  BASIC METABOLIC PANEL WITH GFR - Abnormal; Notable for the following components:      Result Value   Potassium 3.0 (*)    Glucose, Bld 115 (*)    All other components within normal limits  CBC WITH DIFFERENTIAL/PLATELET   URINALYSIS, ROUTINE W REFLEX MICROSCOPIC  MAGNESIUM     EKG None  Radiology No results found.  Procedures Procedures    Medications Ordered in ED Medications  oxyCODONE  (Oxy IR/ROXICODONE ) immediate release tablet 5 mg (has no administration in time range)    ED Course/ Medical Decision Making/ A&P                                 Medical Decision Making Amount and/or Complexity of Data Reviewed Labs: ordered.  Risk Prescription drug management.   This patient presents to the ED for concern of lower extremity weakness, this involves an extensive number of treatment options, and is a complaint that carries with it a high risk of complications and morbidity.  The differential diagnosis includes stroke, spinal cord abscess, cauda equina, MS exacerbation   Co morbidities that complicate the patient evaluation  MS   Additional history obtained:  Additional history obtained from visit from 04/08/2023 ED admission for lower extremity weakness.   Lab Tests:  I personally interpreted labs.  The pertinent results include:   K 3, ordered mag and PO K given. Labs otherwise pending.    Imaging Studies ordered:  Considered, waiting to neurology consult.    Cardiac Monitoring: / EKG:  The patient was maintained on a cardiac monitor.    Consultations Obtained:  I requested consultation with the neuro,  and discussed lab and imaging findings as well as pertinent plan - they recommend: awaiting to hear back    Problem List / ED Course / Critical interventions / Medication management  Patient presenting for lower extremity weakness.  It is difficult for her to raise her extremities.  She has weakness with raising legs against gravity and also has weakness with dorsiflexion and plantarflexion.  She has sensation that is intact and strong dorsal pedal pulse equal bilaterally.  She has no saddle anesthesia nor loss of bowel or bladder.  She does have tenderness over her  low back which appears to be acute on chronic with no new falls.  She has not had cough chest pain shortness of breath.  She is otherwise hemodynamically stable.  Has not tried anything.  Reports when she has had symptoms like this in the past she needed admitted for IV steroids given her history of MS.  Do not feel this is stroke as she has not had any unilateral weakness change in sensation slurred speech or visual deficit. I ordered medication including Oxycodone   Reevaluation of the patient after these medicines showed that the patient improved I have reviewed the patients home medicines and have made adjustments as needed   Plan  Sign off  to [employer] oncoming PA         Final Clinical Impression(s) / ED Diagnoses Final diagnoses:  Weakness of both lower extremities    Rx / DC Orders ED Discharge Orders     None         Suzanne Erps Allison Ivory 05/04/23 1533    Lind Repine, MD 05/05/23 [phone]

## 2023-05-05 DIAGNOSIS — R531 Weakness: Secondary | ICD-10-CM | POA: Diagnosis not present

## 2023-05-05 LAB — CBC WITH DIFFERENTIAL/PLATELET
Abs Immature Granulocytes: 0.05 10*3/uL (ref 0.00–0.07)
Basophils Absolute: 0.1 10*3/uL (ref 0.0–0.1)
Basophils Relative: 0 %
Eosinophils Absolute: 0 10*3/uL (ref 0.0–0.5)
Eosinophils Relative: 0 %
HCT: 42.7 % (ref 36.0–46.0)
Hemoglobin: 14.6 g/dL (ref 12.0–15.0)
Immature Granulocytes: 0 %
Lymphocytes Relative: 26 %
Lymphs Abs: 3.2 10*3/uL (ref 0.7–4.0)
MCH: 32.8 pg (ref 26.0–34.0)
MCHC: 34.2 g/dL (ref 30.0–36.0)
MCV: 96 fL (ref 80.0–100.0)
Monocytes Absolute: 0.8 10*3/uL (ref 0.1–1.0)
Monocytes Relative: 7 %
Neutro Abs: 7.9 10*3/uL — ABNORMAL HIGH (ref 1.7–7.7)
Neutrophils Relative %: 67 %
Platelets: 215 10*3/uL (ref 150–400)
RBC: 4.45 MIL/uL (ref 3.87–5.11)
RDW: 13.3 % (ref 11.5–15.5)
WBC: 12.1 10*3/uL — ABNORMAL HIGH (ref 4.0–10.5)
nRBC: 0 % (ref 0.0–0.2)

## 2023-05-05 LAB — URINALYSIS, ROUTINE W REFLEX MICROSCOPIC
Bilirubin Urine: NEGATIVE
Glucose, UA: NEGATIVE mg/dL
Ketones, ur: 5 mg/dL — AB
Leukocytes,Ua: NEGATIVE
Nitrite: NEGATIVE
Protein, ur: 30 mg/dL — AB
Specific Gravity, Urine: 1.023 (ref 1.005–1.030)
pH: 5 (ref 5.0–8.0)

## 2023-05-05 LAB — COMPREHENSIVE METABOLIC PANEL WITH GFR
ALT: 15 U/L (ref 0–44)
AST: 14 U/L — ABNORMAL LOW (ref 15–41)
Albumin: 3.6 g/dL (ref 3.5–5.0)
Alkaline Phosphatase: 183 U/L — ABNORMAL HIGH (ref 38–126)
Anion gap: 9 (ref 5–15)
BUN: 19 mg/dL (ref 6–20)
CO2: 23 mmol/L (ref 22–32)
Calcium: 9.5 mg/dL (ref 8.9–10.3)
Chloride: 110 mmol/L (ref 98–111)
Creatinine, Ser: 0.76 mg/dL (ref 0.44–1.00)
GFR, Estimated: >60 mL/min (ref >60)
Glucose, Bld: 85 mg/dL (ref 70–99)
Potassium: 4.2 mmol/L (ref 3.5–5.1)
Sodium: 142 mmol/L (ref 135–145)
Total Bilirubin: 0.6 mg/dL (ref 0.0–1.2)
Total Protein: 6.7 g/dL (ref 6.5–8.1)

## 2023-05-05 LAB — TSH: TSH: 0.894 u[IU]/mL (ref 0.350–4.500)

## 2023-05-05 LAB — MAGNESIUM: Magnesium: 1.8 mg/dL (ref 1.7–2.4)

## 2023-05-05 LAB — VITAMIN B12: Vitamin B-12: 5306 pg/mL — ABNORMAL HIGH (ref 180–914)

## 2023-05-05 MED ORDER — LIDOCAINE 5 % EX PTCH
1.0000 | MEDICATED_PATCH | CUTANEOUS | Status: DC
  Administered 2023-05-05: 1 via TRANSDERMAL
  Filled 2023-05-05: qty 1

## 2023-05-05 MED ORDER — LORAZEPAM 2 MG/ML IJ SOLN: 1.0000 mg | Freq: Once | INTRAMUSCULAR | Status: DC | PRN

## 2023-05-05 MED ORDER — CYCLOBENZAPRINE HCL 10 MG PO TABS: 10.0000 mg | ORAL_TABLET | Freq: Three times a day (TID) | ORAL | 0 refills | Status: DC | PRN

## 2023-05-05 MED ORDER — PREGABALIN 25 MG PO CAPS: 25.0000 mg | ORAL_CAPSULE | Freq: Three times a day (TID) | ORAL | 2 refills | Status: DC

## 2023-05-05 MED ORDER — OXYCODONE HCL 5 MG PO TABS: 5.0000 mg | ORAL_TABLET | Freq: Three times a day (TID) | ORAL | 0 refills | Status: DC | PRN

## 2023-05-05 NOTE — Evaluation (Signed)
 Physical Therapy Evaluation Patient Details Name: Gabriela Campbell MRN: [id number] DOB: 1965-03-17 Today's Date: 05/05/2023  History of Present Illness  Gabriela Campbell is a 58 y.o. female presents to [facility] on 05/04/23 ~ 3 weeks following a previous admission. Pt presented with an MS relapse.  She reports that over the past few weeks to over 3 months, she has been having a difficult time walking with low back pain after multiple falls. PMH: MS, shingles, recet hospitalization for fall and back pain. MRIs of spine and head without trauma or fracture.  Clinical Impression  Patient admitted as above and presenting with functional mobility limitations 2* bil LE pain, generalized weakness, decreased activity tolerance, intermittent involuntary tremors to body, and ambulatory balance deficits -   all of which has the potential to impact patient's and/or caregivers' safety and independence during functional mobility.This date, pt requiring no assist with bed mobility and up to Minimal assist with functional transfers to chair or toilet with use of Rollator as pt refuses RW due to feeling it is unsafe.  Current level of function is below but close to patient's typical baseline.      Pt did report h/o dizziness with Orthostatics taken and negative. Pt had no episode of dizziness with therapy and denied any h/o LOC.   Patient lives with her father who  able to provide 24/7 supervision and assistance.  Patient demonstrates good rehab potential, and should benefit from continued skilled PT services while in acute care to maximize safety, independence and quality of life at home.  Pt reports she is current with [facility] for HHPT services and continuation of same is highly recommended.      If plan is discharge home, recommend the following: A little help with walking and/or transfers;A little help with bathing/dressing/bathroom;Assistance with cooking/housework;Assist for transportation;Help with stairs or ramp for  entrance   Can travel by private vehicle        Equipment Recommendations None recommended by PT  Recommendations for Other Services       Functional Status Assessment Patient has had a recent decline in their functional status and demonstrates the ability to make significant improvements in function in a reasonable and predictable amount of time.     Precautions / Restrictions Precautions Precautions: Fall Restrictions Weight Bearing Restrictions Per Provider Order: No      Mobility  Bed Mobility Overal bed mobility: Modified Independent                  Transfers Overall transfer level: Needs assistance Equipment used: Rollator (4 wheels) Transfers: Sit to/from Stand Sit to Stand: Contact guard assist, Supervision           General transfer comment: min steady assist for safety only    Ambulation/Gait Ambulation/Gait assistance: Contact guard assist Gait Distance (Feet): 41 Feet (and additional 50' with RW) Assistive device: Rolling walker (2 wheels), Rollator (4 wheels) Gait Pattern/deviations: Step-to pattern, Step-through pattern, Decreased step length - right, Decreased step length - left, Shuffle, Trunk flexed Gait velocity: decr     General Gait Details: CGA with cues for posture and position from AD. Pt ambulated 50' with rollator and additional 60' with RW.  Adamantly states she does not like RW and does not feel safe with it.  Stairs            Wheelchair Mobility     Tilt Bed    Modified Rankin (Stroke Patients Only)       Balance Overall balance  assessment: History of Falls, Needs assistance Sitting-balance support: No upper extremity supported, Feet supported Sitting balance-Leahy Scale: Good     Standing balance support: Bilateral upper extremity supported, During functional activity, Reliant on assistive device for balance Standing balance-Leahy Scale: Fair (poor with more dynamic standing and LEs fatigue easily)                                Pertinent Vitals/Pain Pain Assessment Pain Assessment: 0-10 Pain Score: 10-Worst pain ever Pain Location: BLEs LT more painful than RT but it jumps. Pain increases with standing WB to LEs Pain Descriptors / Indicators: Burning, Aching Pain Intervention(s): Limited activity within patient's tolerance, Monitored during session, Premedicated before session    Home Living Family/patient expects to be discharged to:: Private residence Living Arrangements: Parent Available Help at Discharge: Family Type of Home: House Home Access: Stairs to enter Entrance Stairs-Rails: None Entrance Stairs-Number of Steps: 4   Home Layout: One level Home Equipment: Educational psychologist (4 wheels);Cane - single point      Prior Function Prior Level of Function : Independent/Modified Independent;History of Falls (last six months)             Mobility Comments: pt reports household ambulator with rollator, multiple falls in last 6 months due to BLE give out but able to get up ind. Pt denied new falls since seen 3 weeks ago. ADLs Comments: pt reports ind with self care, fixes microwave meals and simple household chores; dad performs household chores. Neither pt or dad drive. Friends can take pt to [place] and they order groceries with [employer].     Extremity/Trunk Assessment   Upper Extremity Assessment Upper Extremity Assessment: Defer to OT evaluation RUE Deficits / Details: Very weak with MMT grossly 3+/5 and grip near 3/5 RUE Sensation: decreased light touch LUE Deficits / Details: Very weak with MMT grossly 3+/5 and grip near 3/5 LUE Sensation: decreased light touch    Lower Extremity Assessment Lower Extremity Assessment: Generalized weakness;RLE deficits/detail;LLE deficits/detail RLE Sensation: history of peripheral neuropathy LLE Sensation: history of peripheral neuropathy    Cervical / Trunk Assessment Cervical / Trunk Assessment: Other  exceptions Cervical / Trunk Exceptions: Noted some body tremors with standing and ambulating. Pt reports this is not new and part of the MS.  Communication   Communication Communication: No apparent difficulties    Cognition Arousal: Alert Behavior During Therapy: WFL for tasks assessed/performed   PT - Cognitive impairments: No apparent impairments                         Following commands: Intact       Cueing       General Comments      Exercises Other Exercises Other Exercises: Pt educated on setup of home for fall prevention. On use of gait belt which pt was issued when ascending stairs with her father holding it as steps have no railing. Ed on how dad can hold for best safety.   Assessment/Plan    PT Assessment Patient needs continued PT services  PT Problem List Decreased strength;Decreased activity tolerance;Decreased balance;Decreased mobility;Decreased knowledge of use of DME;Pain;Decreased safety awareness       PT Treatment Interventions DME instruction;Gait training;Stair training;Functional mobility training;Therapeutic activities;Therapeutic exercise;Balance training;Patient/family education    PT Goals (Current goals can be found in the Care Plan section)  Acute Rehab PT Goals Patient Stated Goal: Walk without  assistive device PT Goal Formulation: With patient Time For Goal Achievement: 05/19/23 Potential to Achieve Goals: Fair    Frequency Min 3X/week     Co-evaluation PT/OT/SLP Co-Evaluation/Treatment: Yes Reason for Co-Treatment: For patient/therapist safety;To address functional/ADL transfers PT goals addressed during session: Mobility/safety with mobility OT goals addressed during session: ADL's and self-care       AM-PAC PT "6 Clicks" Mobility  Outcome Measure Help needed turning from your back to your side while in a flat bed without using bedrails?: None Help needed moving from lying on your back to sitting on the side of a  flat bed without using bedrails?: None Help needed moving to and from a bed to a chair (including a wheelchair)?: A Little Help needed standing up from a chair using your arms (e.g., wheelchair or bedside chair)?: A Little Help needed to walk in hospital room?: A Little Help needed climbing 3-5 steps with a railing? : A Little 6 Click Score: 20    End of Session Equipment Utilized During Treatment: Gait belt Activity Tolerance: Patient tolerated treatment well;Patient limited by fatigue Patient left: in chair;with call bell/phone within reach;with chair alarm set Nurse Communication: Mobility status PT Visit Diagnosis: Unsteadiness on feet (R26.81);Muscle weakness (generalized) (M62.81);Difficulty in walking, not elsewhere classified (R26.2);Pain Pain - Right/Left:  (bilat) Pain - part of body: Leg    Time: 6045-4098 PT Time Calculation (min) (ACUTE ONLY): 34 min   Charges:   PT Evaluation $PT Eval Low Complexity: 1 Low   PT General Charges $$ ACUTE PT VISIT: 1 Visit         Thedora Finlay PT Acute Rehabilitation Services Pager [phone] Office [phone]   Posie Lillibridge 05/05/2023, 4:18 PM

## 2023-05-05 NOTE — Care Management CC44 (Signed)
 Condition Code 44 Documentation Completed  Patient Details  Name: CHANEY OLIGER MRN: [id number] Date of Birth: 02-02-1965   Condition Code 44 given:  Yes Patient signature on Condition Code 44 notice:  Yes Documentation of 2 MD's agreement:  Yes Code 44 added to claim:  Yes    Levie Ream, RN 05/05/2023, 4:12 PM

## 2023-05-05 NOTE — Hospital Course (Addendum)
 58 y.o. F with MS not on diseases modifying therapy, polycythemia, HTN, chronic pain, hemochromatosis, and smoking who presented with progressive weakness.  Evidently has had worsening gait ataxia as well as generalized weakness progressive over the past few months.  Was admitted last month and received pulse dose steroids.  MRI brain unremarkable. Neurology consulted here and recommended against repeat pulse dose steroids.  They suspected either pseudoexacerbation or development into a secondary progressive MS in the setting of not being on disease modifying therapy.

## 2023-05-05 NOTE — Assessment & Plan Note (Signed)
 Neurology consulted and recommended evaluation for pseudoexacberation, PT eval and close outpatient follow up with her primary neurologist. Here, renal function normal, electrolytes only notable for mild hypokalemia.  Glucose normal and recent A1c <6%.  CXR clear.  - Obtain UA - Correct K - Check TSH, B12 - PT eval

## 2023-05-05 NOTE — Care Management Obs Status (Signed)
 MEDICARE OBSERVATION STATUS NOTIFICATION   Patient Details  Name: Gabriela Campbell MRN: [id number] Date of Birth: Jul 21, 1965   Medicare Observation Status Notification Given:  Yes    Levie Ream, RN 05/05/2023, 4:12 PM

## 2023-05-05 NOTE — Evaluation (Signed)
 Occupational Therapy Evaluation Patient Details Name: Gabriela Campbell MRN: [id number] DOB: 11/23/1965 Today's Date: 05/05/2023   History of Present Illness   Gabriela Campbell is a 58 y.o. female presents to [facility] on 05/04/23 ~ 3 weeks following a previous admission. Pt presented with an MS relapse.  She reports that over the past few weeks to over 3 months, she has been having a difficult time walking with low back pain after multiple falls. PMH: MS, shingles, recet hospitalization for fall and back pain. MRIs of spine and head without trauma or fracture.     Clinical Impressions Patient is currently requiring as high as CGA to light minimal assistance with basic ADLs, as well as  no assist with bed mobility and up to Minimal assist with functional transfers to chair or toilet with use of Rollator as pt refuses RW due to feeling it is unsafe.   Current level of function is below but close to patient's typical baseline.    During this evaluation, patient was limited by BLE pain, generalized weakness, impaired activity tolerance, and involuntary tremors to body once mobilizing, all of which has the potential to impact patient's and/or caregivers' safety and independence during functional mobility, as well as performance for ADLs.  Pt did report h/o dizziness with Orthostatics taken and negative. Pt had no episode of dizziness with therapy an denied any h/o LOC.  Patient lives with her father who  able to provide 24/7 supervision and assistance.  Patient demonstrates good rehab potential, and should benefit from continued skilled occupational therapy services while in acute care to maximize safety, independence and quality of life at home.  Continued occupational therapy services in the home are recommended.  ?      If plan is discharge home, recommend the following:   Assist for transportation;Assistance with cooking/housework;A little help with walking and/or transfers;A little help with  bathing/dressing/bathroom     Functional Status Assessment   Patient has had a recent decline in their functional status and demonstrates the ability to make significant improvements in function in a reasonable and predictable amount of time.     Equipment Recommendations   None recommended by OT     Recommendations for Other Services         Precautions/Restrictions   Precautions Precautions: Fall Restrictions Weight Bearing Restrictions Per Provider Order: No     Mobility Bed Mobility Overal bed mobility: Modified Independent                  Transfers                          Balance Overall balance assessment: History of Falls, Needs assistance   Sitting balance-Leahy Scale: Good     Standing balance support: Bilateral upper extremity supported, During functional activity, Reliant on assistive device for balance Standing balance-Leahy Scale: Fair (Poor with more dynamic standing and  LEs quick to fatigue.)                             ADL either performed or assessed with clinical judgement   ADL Overall ADL's : Needs assistance/impaired Eating/Feeding: Independent   Grooming: Sitting;Set up   Upper Body Bathing: Sitting;Set up   Lower Body Bathing: Supervison/ safety;Set up;Contact guard assist;Sitting/lateral leans Lower Body Bathing Details (indicate cue type and reason): Recommended use of pt's shower chair and to introduce a long bath brush  or bath sponge for energy conservation at home. Upper Body Dressing : Set up;Sitting   Lower Body Dressing: Supervision/safety;Set up;Sitting/lateral leans Lower Body Dressing Details (indicate cue type and reason): Recliner level with LE elevated and pt reaching down to feet. Pt usually does not wear socks. Toilet Transfer: Contact guard assist;Minimal assistance;Rollator (4 wheels) Toilet Transfer Details (indicate cue type and reason): Pt stood from EOB to Rollator with CGA to  SBA. Pt ambulated with PT assisting and chair follow. Note pt with decreased control of rollator and unsteady gait. Pt took seated rest break in recliner and received edcuation on possible benefits of RW. Pt stoof from recliner to RW with cues for hands and CGA. Pt trie ~2-3 forward steps with RW then asked to sit again and reported feeling unsafe with 2 wheeded RW. Toileting- Clothing Manipulation and Hygiene: Supervision/safety;Sitting/lateral lean;Set up       Functional mobility during ADLs: Contact guard assist;Minimal assistance;Cueing for safety;Rolling walker (2 wheels)       Vision Baseline Vision/History: 1 Wears glasses Ability to See in Adequate Light: 0 Adequate Patient Visual Report: No change from baseline       Perception         Praxis         Pertinent Vitals/Pain Pain Assessment Pain Assessment: 0-10 Pain Score: 10-Worst pain ever Pain Location: BLEs LT more painful than RT but it jumps. Pain increases with standing WB to LEs Pain Descriptors / Indicators: Burning, Aching Pain Intervention(s): Limited activity within patient's tolerance, Premedicated before session, Repositioned, Monitored during session, Patient requesting pain meds-RN notified (RN answered with no pain meds available. PT premedicated 1 hour prior.)     Extremity/Trunk Assessment Upper Extremity Assessment Upper Extremity Assessment: RUE deficits/detail;LUE deficits/detail RUE Deficits / Details: Very weak with MMT grossly 3+/5 and grip near 3/5 RUE Sensation: decreased light touch LUE Deficits / Details: Very weak with MMT grossly 3+/5 and grip near 3/5 LUE Sensation: decreased light touch       Cervical / Trunk Assessment Cervical / Trunk Assessment: Other exceptions Cervical / Trunk Exceptions: Noted some body tremors with standing and ambulating. Pt reports this is not new and part of the MS.   Communication Communication Communication: No apparent difficulties   Cognition  Arousal: Alert Behavior During Therapy: WFL for tasks assessed/performed Cognition: No apparent impairments             OT - Cognition Comments: Ox4                 Following commands: Intact       Cueing  General Comments          Exercises Other Exercises Other Exercises: Pt educated on setup of home for fall prevention. On use of gait elt which pt was issued when ascending stairs with her father holding it as steps have no railing. Ed on how dad can hold for best safety.   Shoulder Instructions      Home Living Family/patient expects to be discharged to:: Private residence Living Arrangements: Parent Available Help at Discharge: Family Type of Home: House Home Access: Stairs to enter Secretary/administrator of Steps: 4 Entrance Stairs-Rails: None Home Layout: One level     Bathroom Shower/Tub: Producer, television/film/video: Standard     Home Equipment: Educational psychologist (4 wheels);Cane - single point          Prior Functioning/Environment Prior Level of Function : Independent/Modified Independent;History of Falls (last six months)  Mobility Comments: pt reports household ambulator with rollator, multiple falls in last 6 months due to BLE give out but able to get up ind. Pt denied new falls since seen 3 weeks ago. ADLs Comments: pt reports ind with self care, fixes microwave meals and simple household chores; dad performs household chores. Neither pt or dad drive. Friends can take pt to MD and they order groceries with [employer].    OT Problem List: Decreased strength;Decreased safety awareness;Decreased activity tolerance;Pain;Impaired balance (sitting and/or standing)   OT Treatment/Interventions: Self-care/ADL training;Therapeutic activities;Patient/family education;Energy conservation;DME and/or AE instruction;Balance training      OT Goals(Current goals can be found in the care plan section)   Acute Rehab OT  Goals Patient Stated Goal: Stand up and walk without a walker or cane. And improve balance without light headedness. OT Goal Formulation: With patient Time For Goal Achievement: 05/19/23 Potential to Achieve Goals: Good ADL Goals Pt Will Perform Lower Body Bathing: with adaptive equipment;with modified independence;sitting/lateral leans Pt Will Perform Lower Body Dressing: with adaptive equipment;sitting/lateral leans;sit to/from stand;with set-up;with supervision Pt Will Transfer to Toilet: ambulating;with supervision Pt Will Perform Toileting - Clothing Manipulation and hygiene: with modified independence Additional ADL Goal #1: Patient will identify at least 3 energy conservation strategies to employ at home in order to maximize function and quality of life and decrease caregiver burden while preventing exacerbation of symptoms and rehospitalization Additional ADL Goal #2: Patient will identify at least 3 fall prevention strategies to employ at home in order to maximize function and safety during ADLs and decrease caregiver burden while preventing possible injury and rehospitalization.   OT Frequency:  Min 2X/week    Co-evaluation PT/OT/SLP Co-Evaluation/Treatment: Yes Reason for Co-Treatment: For patient/therapist safety;To address functional/ADL transfers PT goals addressed during session: Mobility/safety with mobility OT goals addressed during session: ADL's and self-care      AM-PAC OT "6 Clicks" Daily Activity     Outcome Measure Help from another person eating meals?: None Help from another person taking care of personal grooming?: A Little Help from another person toileting, which includes using toliet, bedpan, or urinal?: A Little Help from another person bathing (including washing, rinsing, drying)?: A Little Help from another person to put on and taking off regular upper body clothing?: A Little Help from another person to put on and taking off regular lower body clothing?:  A Little 6 Click Score: 19   End of Session Equipment Utilized During Treatment: Gait belt;Rollator (4 wheels) Nurse Communication: Mobility status;Patient requests pain meds  Activity Tolerance: Patient limited by fatigue;Patient limited by pain Patient left: with chair alarm set;with call bell/phone within reach;in chair  OT Visit Diagnosis: Unsteadiness on feet (R26.81);Repeated falls (R29.6);Muscle weakness (generalized) (M62.81);History of falling (Z91.81);Ataxia, unspecified (R27.0);Pain Pain - Right/Left:  (Bil) Pain - part of body: Leg                Time: 4098-1191 OT Time Calculation (min): 38 min Charges:  OT General Charges $OT Visit: 1 Visit OT Evaluation $OT Eval Low Complexity: 1 Low OT Treatments $Self Care/Home Management : 8-22 mins  Gabriela Campbell, OT Acute Rehab Services Office: [phone] 05/05/2023   Gabriela Campbell 05/05/2023, 3:12 PM

## 2023-05-05 NOTE — Discharge Summary (Signed)
 Physician Discharge Summary   [name] MRN: [id number] DOB: [birth year]  Admit date:     05/04/2023  Discharge date: 05/05/23  Discharge Physician: Ephriam Hashimoto   PCP: [facility]     Recommendations at discharge:  Follow up with Neurology Dr. 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 F with MS not on diseases modifying therapy, polycythemia, HTN, chronic pain, hemochromatosis, and smoking who presented with progressive weakness.  Evidently has had worsening gait ataxia as well as generalized weakness progressive over the past few months.  Was admitted last month and received pulse dose steroids.  MRI brain unremarkable. Neurology consulted here and recommended against repeat pulse dose steroids.  They suspected either pseudoexacerbation or development into a secondary progressive MS in the setting of not being on disease modifying therapy.         *Worsening ataxia, bilateral lower extremity weakness, bilateral hand paresthesia Neurology were consulted, and felt that the patient likely had either pseudo exacerbation, or (more likely) gradually becoming worse and her relapsing remitting course might be becoming secondary progressive.    Here, renal function, TSH normal.  Electrolytes normal for only very mild hypokalemia, doubt due to pseudoexacerbation.  Glucose normal on recent A1c less than 6%.  Chest x-ray normal, urinalysis showed no RBC, WBC to suggest UTI.    Neurology recommended no imaging, and recommended AGAINST high dose  steroids given her recent course.    With pseudoexacerbation ruled out, patient was started on Lyrica  (she has an intolerance to gabapentin ) and Flexeril  and discharged.  Neurology recommended close follow up with [facility], discussion of starting disease modifying therapy and referral to pain management clinic.              The [place]  Controlled Substances Registry was reviewed for this patient prior to discharge.   Consultants: Neurology   Disposition: Home health Diet recommendation:  Discharge Diet Orders (From admission, onward)     Start     Ordered   05/05/23 0000  Diet - low sodium heart healthy        05/05/23 1454             DISCHARGE MEDICATION: Allergies as of 05/05/2023       Reactions   Dilaudid [hydromorphone Hcl] Anaphylaxis   Fish Allergy Anaphylaxis   Ibuprofen Anaphylaxis   Iodine Anaphylaxis   Penicillins Anaphylaxis   Shellfish Allergy Anaphylaxis   Shrimp Extract Anaphylaxis   Tramadol  Nausea And Vomiting, Anaphylaxis   Baclofen  Other (See Comments)   Makes the patient feel wobbly   Gabapentin  Other (See Comments)   GI upset   Codeine Nausea Only        Medication List     TAKE these medications    amLODipine  5 MG tablet Commonly known as: NORVASC  Take 0.5 tablets (2.5 mg total) by mouth daily. What changed:  how much to take when to take this   cyclobenzaprine  10 MG tablet Commonly known as: FLEXERIL  Take 1 tablet (10 mg total) by mouth 3 (three) times daily as needed for  muscle spasms.   oxyCODONE  5 MG immediate release tablet Commonly known as: Roxicodone  Take 1 tablet (5 mg total) by mouth every 8 (eight) hours as needed. What changed:  medication strength when to take this   pregabalin  25 MG capsule Commonly known as: Lyrica  Take 1 capsule (25 mg total) by mouth 3 (three) times daily.   ursodiol  300 MG capsule Commonly known as: ACTIGALL  Take 600 mg by mouth in the morning.   VITAMIN B12 PO Take 1 tablet  by mouth daily.   Vitamin D3 25 MCG (1000 UT) Caps Take 2,000 Units by mouth daily.        Follow-up Information     [facility]. Schedule an appointment as soon as possible for a visit in 1 week(s).   Contact information: [address] [place] [address] [phone]         Phebe Brasil, MD. Schedule an appointment as soon as possible for a visit in 1 week(s).   Specialty: Neurology Contact information: [address] [place] [address] [phone]                 Discharge Instructions     Ambulatory referral to Neurology   Complete by: As directed    An appointment is requested in approximately: 2 weeks For Dr. Gracie Lav or NP  Within 2 weeks if able, for evaluation of progressive MS   Diet - low sodium heart healthy   Complete by: As directed    Discharge instructions   Complete by: As directed    **IMPORTANT DISCHARGE INSTRUCTIONS**   From Dr. Darlyn Eke: You were admitted for leg weakness and trouble walking.  You were evaluated here by our Neurologist, who felt that this was from either a pseudoexacerbation or from progression of your MS  We looked for causes of a pseudoexacerbation (such as a pneumonia, a bladder infection, etc) but found no cause.  Therefore, we strongly suspect your symptoms are from worsening MS  Call Dr. Torrance Freestone office asap to get a follow up appointment.  I have sent a request to them.  Also, call your PCP tomorrow to get a follow up appointment and referral to a pain clinic  For pain, take cyclobenzaprine  10 mg up to three times daily for spasms Take Lyrica /pregabalin  25 mg three times daily on schedule Or take oxycodone  5 mg up to four times daily for pain  Call your primary doctor for a referral to a pain clinic, if the medicines you have from Dr. Gracie Lav are not suitable to manage your symptoms.   Increase activity slowly   Complete by: As directed        Discharge Exam: Filed Weights   05/05/23  1006  Weight: 48 kg    General: Pt is alert, awake, not in acute distress, ambulating with walker, sitting up in bed Cardiovascular: RRR, nl S1-S2, no murmurs appreciated.   No LE edema.   Respiratory: Normal respiratory rate and rhythm.  CTAB without rales or wheezes. Abdominal: Abdomen soft and non-tender.  No distension or HSM.   Neuro/Psych: Face symmetric, EOMI.  Motor strength testing is 4/5 in the upper extremities bilaterally.  Her lower extremities are ataxic and show reduced strength, roughly symmetric.  The patient is oriented to time, place and person.  Speech is fluent.  Naming is grossly intact.  Recall, recent and remote, as well as general fund of knowledge seem within normal limits.    Condition at discharge: poor  The results  of significant diagnostics from this hospitalization (including imaging, microbiology, ancillary and laboratory) are listed below for reference.   Imaging Studies: DG Chest Port 1 View Result Date: 05/04/2023 CLINICAL DATA:  Weakness EXAM: PORTABLE CHEST 1 VIEW COMPARISON:  04/08/2023 FINDINGS: Biapical scarring. No acute confluent opacities or effusions. Heart and mediastinal contours within normal limits. IMPRESSION: No active disease. Electronically Signed   By: Janeece Mechanic M.D.   On: 05/04/2023 22:11   MR BRAIN WO CONTRAST Result Date: 05/04/2023 CLINICAL DATA:  Neuro deficit, acute, stroke suspected EXAM: MRI HEAD WITHOUT CONTRAST TECHNIQUE: Multiplanar, multiecho pulse sequences of the brain and surrounding structures were obtained without intravenous contrast. COMPARISON:  CT head April 7, 25.  MRI head February 13, 2023. FINDINGS: Motion limited study. Brain: No acute infarction, hemorrhage, hydrocephalus, extra-axial collection or mass lesion. Similar scattered T2/FLAIR hyperintensity in the white matter, compatible with chronic demyelination. The absence of contrast precludes evaluation for enhancing lesions. Vascular: Major arterial flow voids are  maintained at the skull base. Skull and upper cervical spine: Normal marrow signal. Sinuses/Orbits: Clear sinuses.  No acute orbital findings. IMPRESSION: No evidence of acute intracranial abnormality. Similar white matter disease, compatible with chronic demyelination. Electronically Signed   By: Stevenson Elbe M.D.   On: 05/04/2023 20:03   CT Lumbar Spine Wo Contrast Result Date: 04/08/2023 CLINICAL DATA:  Trauma, recurrent falls. Leg weakness, right-sided pain and back pain. EXAM: CT LUMBAR SPINE WITHOUT CONTRAST TECHNIQUE: Multidetector CT imaging of the lumbar spine was performed without intravenous contrast administration. Multiplanar CT image reconstructions were also generated. RADIATION DOSE REDUCTION: This exam was performed according to the departmental dose-optimization program which includes automated exposure control, adjustment of the mA and/or kV according to patient size and/or use of iterative reconstruction technique. COMPARISON:  Lumbar spine MRI 02/13/2023. FINDINGS: Segmentation: 5 lumbar type vertebrae. Alignment: Lumbar lordosis is maintained. No listhesis. Mild levocurvature of the lumbar spine centered at L3-4. Vertebrae: No evidence of compression fracture or displaced fracture in the lumbar spine. No suspicious osseous lesion. Paraspinal and other soft tissues: The visualized paraspinal soft tissues are unremarkable. Cholecystectomy surgical clips. Mild atherosclerosis of the abdominal aorta and branch vessels. Disc levels: Intervertebral disc spaces are maintained. Small disc bulges at multiple levels. No high-grade spinal canal stenosis. Disc bulge and facet arthrosis at L4-5 without significant spinal canal stenosis. No high-grade osseous foraminal stenosis. IMPRESSION: No acute fracture or traumatic malalignment of the lumbar spine. Electronically Signed   By: Denny Flack M.D.   On: 04/08/2023 15:40   CT HEAD WO CONTRAST ( ) Result Date: 04/08/2023 CLINICAL DATA:  Trauma,  recurrent falls for the past 2 weeks. Leg weakness, right-sided generalized pain. EXAM: CT HEAD WITHOUT CONTRAST CT CERVICAL SPINE WITHOUT CONTRAST TECHNIQUE: Multidetector CT imaging of the head and cervical spine was performed following the standard protocol without intravenous contrast. Multiplanar CT image reconstructions of the cervical spine were also generated. RADIATION DOSE REDUCTION: This exam was performed according to the departmental dose-optimization program which includes automated exposure control, adjustment of the mA and/or kV according to patient size and/or use of iterative reconstruction technique. COMPARISON:  MRI head 02/13/2023. FINDINGS: CT HEAD FINDINGS Brain: No acute intracranial hemorrhage. No CT evidence of acute infarct. Focal hypoattenuation in the right corona radiata likely related to demyelinating lesion better evaluated on recent MRI. No edema, mass effect, or midline shift. The basilar cisterns are patent. Ventricles: The ventricles are normal. Vascular: No hyperdense vessel or unexpected calcification. Skull: No acute or aggressive finding.  Orbits: Orbits are symmetric. Sinuses: The visualized paranasal sinuses are clear. Other: Mastoid air cells are clear. CT CERVICAL SPINE FINDINGS Alignment: Normal. Skull base and vertebrae: No compression fracture or displaced fracture in the cervical spine. No suspicious osseous lesion. Soft tissues and spinal canal: No prevertebral fluid or swelling. No visible canal hematoma. Disc levels: Intervertebral disc spaces are relatively maintained. No high-grade osseous spinal canal stenosis. No high-grade osseous foraminal stenosis. Upper chest: Biapical pleuroparenchymal scarring. Emphysema. Small focus of soft tissue along the left lateral wall of the trachea just above the thoracic inlet which may reflect respiratory secretions. Other: None. IMPRESSION: No CT evidence of acute intracranial abnormality. No acute fracture or traumatic  malalignment of the cervical spine. Soft tissue along the lateral wall of the extra thoracic trachea which may reflect respiratory secretions. Recommend correlation for history of aspiration. Electronically Signed   By: Denny Flack M.D.   On: 04/08/2023 15:32   CT Cervical Spine Wo Contrast Result Date: 04/08/2023 CLINICAL DATA:  Trauma, recurrent falls for the past 2 weeks. Leg weakness, right-sided generalized pain. EXAM: CT HEAD WITHOUT CONTRAST CT CERVICAL SPINE WITHOUT CONTRAST TECHNIQUE: Multidetector CT imaging of the head and cervical spine was performed following the standard protocol without intravenous contrast. Multiplanar CT image reconstructions of the cervical spine were also generated. RADIATION DOSE REDUCTION: This exam was performed according to the departmental dose-optimization program which includes automated exposure control, adjustment of the mA and/or kV according to patient size and/or use of iterative reconstruction technique. COMPARISON:  MRI head 02/13/2023. FINDINGS: CT HEAD FINDINGS Brain: No acute intracranial hemorrhage. No CT evidence of acute infarct. Focal hypoattenuation in the right corona radiata likely related to demyelinating lesion better evaluated on recent MRI. No edema, mass effect, or midline shift. The basilar cisterns are patent. Ventricles: The ventricles are normal. Vascular: No hyperdense vessel or unexpected calcification. Skull: No acute or aggressive finding. Orbits: Orbits are symmetric. Sinuses: The visualized paranasal sinuses are clear. Other: Mastoid air cells are clear. CT CERVICAL SPINE FINDINGS Alignment: Normal. Skull base and vertebrae: No compression fracture or displaced fracture in the cervical spine. No suspicious osseous lesion. Soft tissues and spinal canal: No prevertebral fluid or swelling. No visible canal hematoma. Disc levels: Intervertebral disc spaces are relatively maintained. No high-grade osseous spinal canal stenosis. No high-grade  osseous foraminal stenosis. Upper chest: Biapical pleuroparenchymal scarring. Emphysema. Small focus of soft tissue along the left lateral wall of the trachea just above the thoracic inlet which may reflect respiratory secretions. Other: None. IMPRESSION: No CT evidence of acute intracranial abnormality. No acute fracture or traumatic malalignment of the cervical spine. Soft tissue along the lateral wall of the extra thoracic trachea which may reflect respiratory secretions. Recommend correlation for history of aspiration. Electronically Signed   By: Denny Flack M.D.   On: 04/08/2023 15:32   DG Pelvis Portable Result Date: 04/08/2023 CLINICAL DATA:  Pain.  Recent history of falls. EXAM: PORTABLE PELVIS 1-2 VIEWS COMPARISON:  Pelvic radiograph dated 04/07/2022. FINDINGS: There is no evidence of pelvic fracture or diastasis. Femoral heads are seated within the acetabula. Sacroiliac joints and pubic symphysis are anatomically aligned. IMPRESSION: No acute osseous abnormality. Electronically Signed   By: Mannie Seek M.D.   On: 04/08/2023 12:50   DG Chest Portable 1 View Result Date: 04/08/2023 CLINICAL DATA:  Pain.  Recent history of falls. EXAM: PORTABLE CHEST 1 VIEW COMPARISON:  Chest radiograph dated 02/14/2023. FINDINGS: The heart size and mediastinal contours are within normal limits.  Similar biapical pleuroparenchymal scarring. No focal consolidation, pleural effusion, or pneumothorax. No acute osseous abnormality. IMPRESSION: No acute findings in the chest. Electronically Signed   By: Mannie Seek M.D.   On: 04/08/2023 12:49    Microbiology: Results for orders placed or performed during the hospital encounter of 02/13/23  Urine Culture     Status: Abnormal   Collection Time: 02/13/23  1:39 AM   Specimen: Urine, Random  Result Value Ref Range Status   Specimen Description   Final    URINE, RANDOM Performed at [facility], [address], [place], [place] [address]     Special Requests   Final    NONE Reflexed from [id number] Performed at [facility], [address] [place], [place] [address]    Culture (A)  Final    >=100,000 COLONIES/mL AEROCOCCUS SPECIES Standardized susceptibility testing for this organism is not available. Performed at [facility], [address] [place], [place] [address]    Report Status 02/17/2023 FINAL  Final    Labs: CBC: Recent Labs  Lab 05/04/23 1448 05/05/23 0551  WBC 11.7* 12.1*  NEUTROABS 8.1* 7.9*  HGB 17.0* 14.6  HCT 48.6* 42.7  MCV 93.5 96.0  PLT 296 215   Basic Metabolic Panel: Recent Labs  Lab 05/04/23 1448 05/05/23 0551  NA 140 142  K 3.0* 4.2  CL 100 110  CO2 28 23  GLUCOSE 115* 85  BUN 12 19  CREATININE 0.69 0.76  CALCIUM 9.9 9.5  MG 2.0 1.8   Liver Function Tests: Recent Labs  Lab 05/05/23 0551  AST 14*  ALT 15  ALKPHOS 183*  BILITOT 0.6  PROT 6.7  ALBUMIN 3.6   CBG: No results for input(s): "GLUCAP" in the last 168 hours.  Discharge time spent: approximately 45 minutes spent on discharge counseling, evaluation of patient on day of discharge, and coordination of discharge planning with nursing, social work, pharmacy and case management  Signed: Ephriam Hashimoto, MD [facility] 05/05/2023

## 2023-05-05 NOTE — TOC Initial Note (Signed)
 Transition of Care [facility]) - Initial/Assessment Note    Patient Details  Name: Gabriela Campbell MRN: [id number] Date of Birth: [birth year]  [facility] CM/SW Contact:    Levie Ream, RN Phone Number: 05/05/2023, 4:19 PM  Clinical Narrative:                 Orders received for HHPT/OT; spoke w/ in room; pt says she shares a home w/ her father; she plans to return at d/c; pt says she will arrange transportation; she verified insurance/PCP; pt says she has walker and shower chair; pt says she has HHPT w/ Mitzie Anda; she does not have home oxygen; she agrees to HHPT/OT and wants to con't [facility] services w/ Ardena Koyanagi; notified Cory at agency and he says agency can provide services; pt notified; agency contact info placed in follow up provider section of d/c instructions; no TOC needs.  Expected Discharge Plan: Home/Self Care Barriers to Discharge: No Barriers Identified   Patient Goals and CMS Choice Patient states their goals for this hospitalization and ongoing recovery are:: home CMS Medicare.gov Compare Post Acute Care list provided to:: Patient        Expected Discharge Plan and Services   Discharge Planning Services: CM Consult   Living arrangements for the past 2 months: Single Family Home Expected Discharge Date: 05/05/23               DME Arranged: N/A DME Agency: NA       HH Arranged: PT, OT HH [facility]: [facility] Date [facility] Agency Contacted: 05/05/23 Time HH Agency Contacted: [id number] Representative spoke with at [facility] Agency: Randel Buss  Prior Living Arrangements/Services Living arrangements for the past 2 months: Single Family Home Lives with:: Parents Patient language and need for interpreter reviewed:: Yes Do you feel safe going back to the place where you live?: Yes      Need for Family Participation in Patient Care: Yes (Comment) Care giver support system in place?: Yes (comment) Current home services: DME (walker, shower chair) Criminal  Activity/Legal Involvement Pertinent to Current Situation/Hospitalization: No - Comment as needed  Activities of Daily Living   ADL Screening (condition at time of admission) Independently performs ADLs?: Yes (appropriate for developmental age) Is the patient deaf or have difficulty hearing?: No Does the patient have difficulty seeing, even when wearing glasses/contacts?: No Does the patient have difficulty concentrating, remembering, or making decisions?: No  Permission Sought/Granted Permission sought to share information with : Case Manager Permission granted to share information with : Yes, Verbal Permission Granted  Share Information with NAME: Case Manager     Permission granted to share info w Relationship: Alver Jobs     Emotional Assessment Appearance:: Appears stated age Attitude/Demeanor/Rapport: Gracious Affect (typically observed): Accepting Orientation: : Oriented to Self, Oriented to Place, Oriented to  Time, Oriented to Situation Alcohol / Substance Use: Not Applicable Psych Involvement: No (comment)  Admission diagnosis:  Generalized weakness [R53.1] Weakness of both lower extremities [R29.898] Patient Active Problem List   Diagnosis Date Noted   Generalized weakness 05/04/2023   DNR (do not resuscitate) 04/12/2023   History of multiple sclerosis (HCC) 04/08/2023   Tobacco dependence syndrome 04/08/2023   Tobacco use 04/08/2023   Hepatic fibrosis, advanced fibrosis 03/03/2023   Scoliosis deformity of spine 02/27/2023   Bilateral leg weakness 07/28/2022   Chronic low back pain 07/28/2022   Essential hypertension 07/28/2022   Hypokalemia 07/28/2022   Polycythemia 07/28/2022   left leg pain 06/29/2021  Hereditary hemochromatosis (HCC) 07/29/2020   Elevated liver enzymes 05/31/2020   Vitamin D  deficiency 02/18/2018   Relapsing remitting multiple sclerosis (HCC) 03/28/2017   Gait abnormality 03/14/2017   Smoker 10/10/2016   Leg weakness, bilateral  10/10/2016   PCP:  [facility] Pharmacy:   [id number] Jonette Nestle, [place] - [address] AT [facility] OF [facility] & [address] [place] [address] Phone: [phone] Fax: [phone]     Social Drivers of Health (SDOH) Social History: SDOH Screenings   Food Insecurity: No Food Insecurity (05/05/2023)  Housing: Low Risk  (05/05/2023)  Transportation Needs: No Transportation Needs (05/05/2023)  Utilities: Not At Risk (05/05/2023)  Depression (PHQ2-9): High Risk (10/25/2020)  Social Connections: Socially Isolated (05/04/2023)  Tobacco Use: High Risk (05/04/2023)   SDOH Interventions: Food Insecurity Interventions: Intervention Not Indicated, Inpatient TOC Housing Interventions: Intervention Not Indicated, Inpatient TOC Transportation Interventions: Intervention Not Indicated, Inpatient TOC Utilities Interventions: Intervention Not Indicated, Inpatient TOC   Readmission Risk Interventions    04/11/2023   12:02 PM  Readmission Risk Prevention Plan  Post Dischage Appt Complete  Medication Screening Complete  Transportation Screening Complete

## 2023-05-06 ENCOUNTER — Telehealth: Payer: Self-pay

## 2023-05-06 NOTE — Telephone Encounter (Signed)
 Per sarah note, please schedule with Dr. Gracie Lav. Attempted to reach patient, documented in another phone note.

## 2023-05-06 NOTE — Telephone Encounter (Signed)
 1st attempt lvm by hf 05/06/23

## 2023-05-06 NOTE — Telephone Encounter (Signed)
 Call to patient, left message to call back get scheduled with Dr. Gracie Lav

## 2023-06-01 ENCOUNTER — Encounter (HOSPITAL_COMMUNITY): Payer: Self-pay

## 2023-06-01 ENCOUNTER — Emergency Department (HOSPITAL_COMMUNITY)
Admission: EM | Admit: 2023-06-01 | Discharge: 2023-06-01 | Disposition: A | Discharge status: Home / Self Care | Attending: Emergency Medicine | Admitting: Emergency Medicine

## 2023-06-01 ENCOUNTER — Other Ambulatory Visit: Payer: Self-pay

## 2023-06-01 ENCOUNTER — Emergency Department (HOSPITAL_COMMUNITY)

## 2023-06-01 DIAGNOSIS — W1830XA Fall on same level, unspecified, initial encounter: Secondary | ICD-10-CM | POA: Insufficient documentation

## 2023-06-01 DIAGNOSIS — S2232XA Fracture of one rib, left side, initial encounter for closed fracture: Secondary | ICD-10-CM | POA: Insufficient documentation

## 2023-06-01 DIAGNOSIS — R0781 Pleurodynia: Secondary | ICD-10-CM | POA: Diagnosis present

## 2023-06-01 MED ORDER — OXYCODONE HCL 5 MG PO TABS: 5.0000 mg | ORAL_TABLET | Freq: Four times a day (QID) | ORAL | 0 refills | Status: DC | PRN

## 2023-06-01 MED ORDER — METHOCARBAMOL 500 MG PO TABS: 500.0000 mg | ORAL_TABLET | Freq: Three times a day (TID) | ORAL | 0 refills | Status: AC | PRN

## 2023-06-01 MED ORDER — OXYCODONE HCL 5 MG PO TABS
5.0000 mg | ORAL_TABLET | Freq: Once | ORAL | Status: AC
  Administered 2023-06-01: 5 mg via ORAL
  Filled 2023-06-01: qty 1

## 2023-06-01 NOTE — Discharge Instructions (Signed)
 Please read and follow all provided instructions.  Your diagnoses today include:  1. Closed fracture of one rib of left side, initial encounter    Tests performed today include: Chest x-ray: Shows a broken rib on the left Vital signs. See below for your results today.   Medications prescribed:  Oxycodone  - narcotic pain medication  DO NOT drive or perform any activities that require you to be awake and alert because this medicine can make you drowsy.   Robaxin  (methocarbamol ) - muscle relaxer medication  DO NOT drive or perform any activities that require you to be awake and alert because this medicine can make you drowsy.   Home care instructions:  Follow any educational materials contained in this packet.  Use incentive spirometer 10 times per hour while awake.  BE VERY CAREFUL not to take multiple medicines containing Tylenol  (also called acetaminophen ). Doing so can lead to an overdose which can damage your liver and cause liver failure and possibly death.   Follow-up instructions: Please follow-up with your primary care provider in the next 7 days for further evaluation of your symptoms.   Return instructions:  Please return to the Emergency Department if you experience worsening symptoms.  Please return if you have any other emergent concerns.  Additional Information:  Your vital signs today were: BP (!) 139/92 (BP Location: Left Arm)   Pulse 84   Temp 98.4 F (36.9 C) (Oral)   Resp 16   Ht 5\' 2"  (1.575 m)   Wt 40.8 kg   SpO2 100%   BMI 16.46 kg/m  If your blood pressure (BP) was elevated above 135/85 this visit, please have this repeated by your doctor within one month. --------------

## 2023-06-01 NOTE — ED Provider Notes (Signed)
 [place] EMERGENCY DEPARTMENT AT [facility] Provider Note   CSN: [id number] Arrival date & time: 06/01/23  1342     History  Chief Complaint  Patient presents with   Gabriela Campbell    Gabriela Campbell is a 58 y.o. female.  Patient with history of multiple sclerosis, frequent falls, presents to the emergency department for evaluation of left-sided rib pain after a fall occurring 2 days ago.  Patient states that she was trying to put something away in a dresser when her legs "gave out".  She fell and landed on a straight laundry basket on her left side.  She did not hit her head or lose consciousness.  She states that she was able to pull herself up without assistance.  Since that time she has had pain with movement, deep breathing, laughing coughing in the left lower ribs.  No shortness of breath.  She has been taking ibuprofen.  No bruising noted.       Home Medications Prior to Admission medications   Medication Sig Start Date End Date Taking? Authorizing Provider  amLODipine  (NORVASC ) 5 MG tablet Take 0.5 tablets (2.5 mg total) by mouth daily. Patient taking differently: Take 5 mg by mouth in the morning. 02/14/23   Unk Garb, DO  Cholecalciferol  (VITAMIN D3) 25 MCG (1000 UT) CAPS Take 2,000 Units by mouth daily.    [provider]  Cyanocobalamin  (VITAMIN B12 PO) Take 1 tablet by mouth daily.    [provider]  cyclobenzaprine  (FLEXERIL ) 10 MG tablet Take 1 tablet (10 mg total) by mouth 3 (three) times daily as needed for muscle spasms. 05/05/23   Danford, Willis Harter, MD  oxyCODONE  (ROXICODONE ) 5 MG immediate release tablet Take 1 tablet (5 mg total) by mouth every 8 (eight) hours as needed. 05/05/23 05/04/24  DanfordWillis Harter, MD  pregabalin  (LYRICA ) 25 MG capsule Take 1 capsule (25 mg total) by mouth 3 (three) times daily. 05/05/23 05/04/24  DanfordWillis Harter, MD  ursodiol  (ACTIGALL ) 300 MG capsule Take 600 mg by mouth in the morning. 02/27/23 03/05/24   [provider]      Allergies    Dilaudid [hydromorphone hcl], Fish allergy, Ibuprofen, Iodine, Penicillins, Shellfish allergy, Shrimp extract, Tramadol , Baclofen , Gabapentin , and Codeine    Review of Systems   Review of Systems  Physical Exam Updated Vital Signs BP (!) 139/92 (BP Location: Left Arm)   Pulse 84   Temp 98.4 F (36.9 C) (Oral)   Resp 16   Ht 5\' 2"  (1.575 m)   Wt 40.8 kg   SpO2 100%   BMI 16.46 kg/m   Physical Exam Vitals and nursing note reviewed.  Constitutional:      Appearance: She is well-developed.  HENT:     Head: Normocephalic and atraumatic.  Eyes:     Conjunctiva/sclera: Conjunctivae normal.  Pulmonary:     Effort: No respiratory distress.  Chest:     Chest wall: Tenderness present. No deformity, swelling or edema.    Musculoskeletal:     Cervical back: Normal range of motion and neck supple.  Skin:    General: Skin is warm and dry.  Neurological:     Mental Status: She is alert.     ED Results / Procedures / Treatments   Labs (all labs ordered are listed, but only abnormal results are displayed) Labs Reviewed - No data to display  EKG None  Radiology DG Ribs Unilateral W/Chest Left Result Date: 06/01/2023 CLINICAL DATA:  Recent  fall, left chest and rib pain EXAM: LEFT RIBS AND CHEST - 3+ VIEW COMPARISON:  05/04/2023 FINDINGS: Stable heart size and vascularity. Similar chronic biapical pleural-parenchymal scarring. No superimposed pneumonia, collapse or consolidation. Negative for edema, effusion or pneumothorax. Trachea midline. Left anterolateral eighth rib cortical irregularity consistent with an acute anterior rib fracture. No additional rib abnormality. No chest wall subcutaneous emphysema. Dextrocurvature of the spine noted. IMPRESSION: 1. Acute left anterolateral eighth rib fracture. 2. No acute chest process. Electronically Signed   By: Melven Stable.  Shick M.D.   On: 06/01/2023 15:47    Procedures Procedures    Medications  Ordered in ED Medications - No data to display  ED Course/ Medical Decision Making/ A&P    Patient seen and examined. History obtained directly from patient.   Labs/EKG: None ordered  Imaging: Ordered x-ray chest with left ribs  Medications/Fluids: None ordered  Most recent vital signs reviewed and are as follows: BP (!) 139/92 (BP Location: Left Arm)   Pulse 84   Temp 98.4 F (36.9 C) (Oral)   Resp 16   Ht 5\' 2"  (1.575 m)   Wt 40.8 kg   SpO2 100%   BMI 16.46 kg/m   Initial impression: Left rib contusion  4:30 PM Reassessment performed. Patient appears stable.  Oxycodone  5 mg tablet given for pain.  Imaging personally visualized and interpreted including: Chest x-ray demonstrates acute eighth anterolateral rib fracture consistent with patient exam.  Reviewed pertinent lab work and imaging with patient at bedside. Questions answered.   Most current vital signs reviewed and are as follows: BP (!) 139/92 (BP Location: Left Arm)   Pulse 84   Temp 98.4 F (36.9 C) (Oral)   Resp 16   Ht 5\' 2"  (1.575 m)   Wt 40.8 kg   SpO2 100%   BMI 16.46 kg/m   Plan: Discharge to home.   Prescriptions written for: Oxycodone  # 8 tablets, Robaxin   Patient counseled on use of narcotic pain medications. Counseled not to combine these medications with others containing tylenol . Urged not to drink alcohol, drive, or perform any other activities that requires focus while taking these medications. The patient verbalizes understanding and agrees with the plan.  Other home care instructions discussed: Counseled on use of incentive spirometer  ED return instructions discussed: New or worsening symptoms, worsening shortness of breath or trouble breathing  Follow-up instructions discussed: Patient encouraged to follow-up with their PCP in 7 days as needed.                                Medical Decision Making Amount and/or Complexity of Data Reviewed Radiology:  ordered.  Risk Prescription drug management.   Patient with mechanical fall, rib fracture noted on left consistent with her exam.  No pneumothorax or pulmonary contusion suspected.  Pain control indicated at this point.  Patient looks fairly well.  She remains a high fall risk.  The patient's vital signs, pertinent lab work and imaging were reviewed and interpreted as discussed in the ED course. Hospitalization was considered for further testing, treatments, or serial exams/observation. However as patient is well-appearing, has a stable exam, and reassuring studies today, I do not feel that they warrant admission at this time. This plan was discussed with the patient who verbalizes agreement and comfort with this plan and seems reliable and able to return to the Emergency Department with worsening or changing symptoms.  Final Clinical Impression(s) / ED Diagnoses Final diagnoses:  Closed fracture of one rib of left side, initial encounter    Rx / DC Orders ED Discharge Orders          Ordered    oxyCODONE  (OXY IR/ROXICODONE ) 5 MG immediate release tablet  Every 6 hours PRN        06/01/23 1628    methocarbamol  (ROBAXIN ) 500 MG tablet  Every 8 hours PRN        06/01/23 1628              Cory Rama, PA-C 06/01/23 1634    Guadalupe Lee, MD 06/01/23 2050

## 2023-06-01 NOTE — ED Triage Notes (Signed)
 Pt BIB EMS from home. Pt fell Thursday night, frequent falls, hx of ms. 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 Pt uses a walker at home.

## 2023-06-06 ENCOUNTER — Emergency Department (HOSPITAL_COMMUNITY)
Admission: EM | Admit: 2023-06-06 | Discharge: 2023-06-06 | Disposition: A | Discharge status: Home / Self Care | Attending: Emergency Medicine | Admitting: Emergency Medicine

## 2023-06-06 ENCOUNTER — Other Ambulatory Visit: Payer: Self-pay

## 2023-06-06 ENCOUNTER — Encounter (HOSPITAL_COMMUNITY): Payer: Self-pay | Admitting: *Deleted

## 2023-06-06 DIAGNOSIS — D72829 Elevated white blood cell count, unspecified: Secondary | ICD-10-CM | POA: Insufficient documentation

## 2023-06-06 DIAGNOSIS — E876 Hypokalemia: Secondary | ICD-10-CM | POA: Diagnosis not present

## 2023-06-06 DIAGNOSIS — R531 Weakness: Secondary | ICD-10-CM | POA: Diagnosis present

## 2023-06-06 LAB — CBC WITH DIFFERENTIAL/PLATELET
Abs Immature Granulocytes: 0.05 10*3/uL (ref 0.00–0.07)
Basophils Absolute: 0 10*3/uL (ref 0.0–0.1)
Basophils Relative: 0 %
Eosinophils Absolute: 0 10*3/uL (ref 0.0–0.5)
Eosinophils Relative: 0 %
HCT: 46.1 % — ABNORMAL HIGH (ref 36.0–46.0)
Hemoglobin: 15.2 g/dL — ABNORMAL HIGH (ref 12.0–15.0)
Immature Granulocytes: 0 %
Lymphocytes Relative: 18 %
Lymphs Abs: 2.2 10*3/uL (ref 0.7–4.0)
MCH: 32.5 pg (ref 26.0–34.0)
MCHC: 33 g/dL (ref 30.0–36.0)
MCV: 98.7 fL (ref 80.0–100.0)
Monocytes Absolute: 0.6 10*3/uL (ref 0.1–1.0)
Monocytes Relative: 5 %
Neutro Abs: 9.5 10*3/uL — ABNORMAL HIGH (ref 1.7–7.7)
Neutrophils Relative %: 77 %
Platelets: 183 10*3/uL (ref 150–400)
RBC: 4.67 MIL/uL (ref 3.87–5.11)
RDW: 12.9 % (ref 11.5–15.5)
WBC: 12.3 10*3/uL — ABNORMAL HIGH (ref 4.0–10.5)
nRBC: 0 % (ref 0.0–0.2)

## 2023-06-06 LAB — COMPREHENSIVE METABOLIC PANEL WITH GFR
ALT: 18 U/L (ref 0–44)
AST: 18 U/L (ref 15–41)
Albumin: 3.6 g/dL (ref 3.5–5.0)
Alkaline Phosphatase: 285 U/L — ABNORMAL HIGH (ref 38–126)
Anion gap: 13 (ref 5–15)
BUN: 16 mg/dL (ref 6–20)
CO2: 24 mmol/L (ref 22–32)
Calcium: 9.2 mg/dL (ref 8.9–10.3)
Chloride: 102 mmol/L (ref 98–111)
Creatinine, Ser: 0.54 mg/dL (ref 0.44–1.00)
GFR, Estimated: >60 mL/min (ref >60)
Glucose, Bld: 97 mg/dL (ref 70–99)
Potassium: 3.2 mmol/L — ABNORMAL LOW (ref 3.5–5.1)
Sodium: 139 mmol/L (ref 135–145)
Total Bilirubin: 0.5 mg/dL (ref 0.0–1.2)
Total Protein: 7.3 g/dL (ref 6.5–8.1)

## 2023-06-06 MED ORDER — OXYCODONE-ACETAMINOPHEN 5-325 MG PO TABS
1.0000 | ORAL_TABLET | Freq: Once | ORAL | Status: AC
  Administered 2023-06-06: 1 via ORAL
  Filled 2023-06-06: qty 1

## 2023-06-06 MED ORDER — POTASSIUM CHLORIDE 20 MEQ PO PACK
40.0000 meq | PACK | Freq: Once | ORAL | Status: AC
  Administered 2023-06-06: 40 meq via ORAL
  Filled 2023-06-06: qty 2

## 2023-06-06 MED ORDER — PREDNISONE 50 MG PO TABS
50.0000 mg | ORAL_TABLET | Freq: Once | ORAL | Status: AC
  Administered 2023-06-06: 50 mg via ORAL
  Filled 2023-06-06: qty 1

## 2023-06-06 NOTE — Discharge Instructions (Addendum)
 You were seen in the emergency department for weakness in your legs Your potassium was little low and we gave you oral potassium We gave you a dose of pain medicine You were able to walk with a walker We discussed the option of staying for evaluation for potential nursing facility placement but you did not want this at this time Please use your walker to get around Follow-up with your primary care doctor in 1 week for reevaluation Return to the emergency department for severe weakness or any other concerns

## 2023-06-06 NOTE — ED Notes (Signed)
Urine was obtained and sent.

## 2023-06-06 NOTE — ED Provider Notes (Signed)
 [place] EMERGENCY DEPARTMENT AT [facility] Provider Note   CSN: [id number] Arrival date & time: 06/06/23  1037     History  Chief Complaint  Patient presents with   Extremity Weakness    Gabriela Campbell is a 58 y.o. female.  With history of MS who presents to the ED for lower extremity weakness.  Patient seen here 1 week ago after a fall with isolated left rib fracture back today with increased lower extremity weakness bilateral lower extremities.  Has a history of lower extremity weakness related to her MS.  Has been ambulating with her walker but has had more difficulty.  No additional falls.  No other complaints at this time   Extremity Weakness       Home Medications Prior to Admission medications   Medication Sig Start Date End Date Taking? Authorizing Provider  amLODipine  (NORVASC ) 5 MG tablet Take 0.5 tablets (2.5 mg total) by mouth daily. Patient taking differently: Take 5 mg by mouth in the morning. 02/14/23   Unk Garb, DO  Cholecalciferol  (VITAMIN D3) 25 MCG (1000 UT) CAPS Take 2,000 Units by mouth daily.    [provider]  Cyanocobalamin  (VITAMIN B12 PO) Take 1 tablet by mouth daily.    [provider]  methocarbamol  (ROBAXIN ) 500 MG tablet Take 1 tablet (500 mg total) by mouth every 8 (eight) hours as needed for muscle spasms. 06/01/23   Lyna Sandhoff, PA-C  oxyCODONE  (OXY IR/ROXICODONE ) 5 MG immediate release tablet Take 1 tablet (5 mg total) by mouth every 6 (six) hours as needed for severe pain (pain score 7-10). 06/01/23   Geiple, Joshua, PA-C  pregabalin  (LYRICA ) 25 MG capsule Take 1 capsule (25 mg total) by mouth 3 (three) times daily. 05/05/23 05/04/24  DanfordWillis Harter, MD  ursodiol  (ACTIGALL ) 300 MG capsule Take 600 mg by mouth in the morning. 02/27/23 03/05/24  [provider]      Allergies    Dilaudid [hydromorphone hcl], Fish allergy, Ibuprofen, Iodine, Penicillins, Shellfish allergy, Shrimp extract, Tramadol ,  Baclofen , Gabapentin , and Codeine    Review of Systems   Review of Systems  Musculoskeletal:  Positive for extremity weakness.    Physical Exam Updated Vital Signs BP (!) 158/94   Pulse 79   Temp 98.8 F (37.1 C) (Oral)   Resp 18   Ht 5\' 2"  (1.575 m)   Wt 40.8 kg   SpO2 99%   BMI 16.45 kg/m  Physical Exam Vitals and nursing note reviewed.  HENT:     Head: Normocephalic and atraumatic.  Eyes:     Pupils: Pupils are equal, round, and reactive to light.  Cardiovascular:     Rate and Rhythm: Normal rate and regular rhythm.  Pulmonary:     Effort: Pulmonary effort is normal.     Breath sounds: Normal breath sounds.  Abdominal:     Palpations: Abdomen is soft.     Tenderness: There is no abdominal tenderness.  Skin:    General: Skin is warm and dry.  Neurological:     Mental Status: She is alert.     Comments: 5 out of 5 motor strength bilateral upper and lower extremities Full active range of motion with flexion at the hip and bending the knee Sensation tact light touch throughout  Psychiatric:        Mood and Affect: Mood normal.     ED Results / Procedures / Treatments   Labs (all labs ordered are listed, but only abnormal  results are displayed) Labs Reviewed  CBC WITH DIFFERENTIAL/PLATELET - Abnormal; Notable for the following components:      Result Value   WBC 12.3 (*)    Hemoglobin 15.2 (*)    HCT 46.1 (*)    Neutro Abs 9.5 (*)    All other components within normal limits  COMPREHENSIVE METABOLIC PANEL WITH GFR - Abnormal; Notable for the following components:   Potassium 3.2 (*)    Alkaline Phosphatase 285 (*)    All other components within normal limits    EKG EKG Interpretation Date/Time:  Thursday June 06 2023 13:09:06 EDT Ventricular Rate:  78 PR Interval:  194 QRS Duration:  105 QT Interval:  401 QTC Calculation: 457 R Axis:   90  Text Interpretation: Sinus rhythm Probable inferior infarct, age indeterminate Confirmed by Rafael Bun  [phone]) on 06/06/2023 1:25:33 PM  Radiology No results found.  Procedures Procedures    Medications Ordered in ED Medications  oxyCODONE -acetaminophen  (PERCOCET/ROXICET) 5-325 MG per tablet 1 tablet (1 tablet Oral Given 06/06/23 1338)  predniSONE  (DELTASONE ) tablet 50 mg (50 mg Oral Given 06/06/23 1338)  potassium chloride  (KLOR-CON ) packet 40 mEq (40 mEq Oral Given 06/06/23 1338)    ED Course/ Medical Decision Making/ A&P                                 Medical Decision Making 58 year old female with history as above presenting for bilateral lower extremity weakness.  Recently seen here after a fall with rib fracture.  Afebrile well-appearing on my exam.  5 out of 5 motor strength with great range of motion bilateral lower extremities.  Mild hypokalemia.  Laboratory workup and EKG otherwise unremarkable.  Oral potassium repletion.  No underlying dysrhythmia or electrolyte imbalance or anemia as cause of her weakness.  Patient able to ambulate with steady gait with walker here after pain meds.  Discussed plan for discharge home versus TOC evaluation for potential skilled nursing facility placement.  At this time patient does not wish for [facility] evaluation and feels comfortable plan for discharge home.  She will follow-up with her primary care doctor  Risk Prescription drug management.           Final Clinical Impression(s) / ED Diagnoses Final diagnoses:  Weakness  Hypokalemia    Rx / DC Orders ED Discharge Orders     None         Sallyanne Creamer, DO 06/06/23 1434

## 2023-06-06 NOTE — ED Notes (Signed)
 Pt ambutlated with walker without help from staff. Pt stated that she felt very unsteady.

## 2023-06-06 NOTE — ED Triage Notes (Signed)
 BIB EMS due to increased leg weakness, Pt has MS fell last Thursday and was seen here. 142/88-94-18-97% RA

## 2023-06-25 ENCOUNTER — Other Ambulatory Visit: Payer: Self-pay

## 2023-06-25 ENCOUNTER — Emergency Department (HOSPITAL_COMMUNITY)

## 2023-06-25 ENCOUNTER — Emergency Department (HOSPITAL_COMMUNITY)
Admission: EM | Admit: 2023-06-25 | Discharge: 2023-06-25 | Disposition: A | Discharge status: Home / Self Care | Attending: Emergency Medicine | Admitting: Emergency Medicine

## 2023-06-25 DIAGNOSIS — M545 Low back pain, unspecified: Secondary | ICD-10-CM | POA: Insufficient documentation

## 2023-06-25 DIAGNOSIS — W19XXXA Unspecified fall, initial encounter: Secondary | ICD-10-CM

## 2023-06-25 MED ORDER — OXYCODONE-ACETAMINOPHEN 5-325 MG PO TABS: 1.0000 | ORAL_TABLET | Freq: Three times a day (TID) | ORAL | 0 refills | Status: AC | PRN

## 2023-06-25 MED ORDER — LIDOCAINE 5 % EX PTCH
1.0000 | MEDICATED_PATCH | CUTANEOUS | Status: DC
  Administered 2023-06-25: 1 via TRANSDERMAL
  Filled 2023-06-25: qty 1

## 2023-06-25 MED ORDER — OXYCODONE-ACETAMINOPHEN 5-325 MG PO TABS
1.0000 | ORAL_TABLET | Freq: Once | ORAL | Status: AC
  Administered 2023-06-25: 1 via ORAL
  Filled 2023-06-25: qty 1

## 2023-06-25 NOTE — Discharge Instructions (Addendum)
 As discussed, your imaging is reassuring. Take percocet every 8 hours as needed for pain. You can also get lidocaine  patches over the counter for additional pain relief.  Follow-up with your primary care provider for reevaluation of your low back pain and to your neurologist for reevaluation of your MS symptoms.  Return to the ED if your symptoms worsen in the interim.

## 2023-06-25 NOTE — ED Provider Notes (Signed)
 Patient care taken over at shift change.  Disposition pending CT results.  Physical Exam  BP (!) 145/100 (BP Location: Left Arm)   Pulse 79   Temp 98.5 F (36.9 C) (Oral)   Resp 18   SpO2 99%   Physical Exam  Procedures  Procedures  ED Course / MDM    Medical Decision Making Amount and/or Complexity of Data Reviewed Radiology: ordered.  Risk Prescription drug management.   CT resulted ata 1608 - no acute fracture or traumatic listhesis.  Discussed findings with patient. All questions answered.  She is stable and safe for discharge home. Return precautions given.    Waddell Sluder, PA-C 06/25/23 1629    Patsey Lot, MD 06/26/23 0005

## 2023-06-25 NOTE — ED Provider Notes (Signed)
 [place] EMERGENCY DEPARTMENT AT [facility] Provider Note   CSN: [id number] Arrival date & time: 06/25/23  1318     Patient presents with: Fall and Back Pain   Gabriela Campbell is a 58 y.o. female.  Patient with past history significant for MS presents to the emergency department with concerns of a fall.  Reports that she sustained mechanical fall after losing her footing earlier today and landed on her low back/sacrum.  Worsening pain in this area.  She reports that her legs gave out which is fairly typical for her as she has ongoing leg weakness.  Denies any saddle paresthesia, bilateral weakness numbness, or any other acute new findings.   Fall  Back Pain      Prior to Admission medications   Medication Sig Start Date End Date Taking? Authorizing Provider  amLODipine  (NORVASC ) 5 MG tablet Take 0.5 tablets (2.5 mg total) by mouth daily. Patient taking differently: Take 5 mg by mouth in the morning. 02/14/23   Laurence Locus, Gabriela Campbell  Cholecalciferol  (VITAMIN D3) 25 MCG (1000 UT) CAPS Take 2,000 Units by mouth daily.    [provider]  Cyanocobalamin  (VITAMIN B12 PO) Take 1 tablet by mouth daily.    [provider]  methocarbamol  (ROBAXIN ) 500 MG tablet Take 1 tablet (500 mg total) by mouth every 8 (eight) hours as needed for muscle spasms. 06/01/23   Desiderio Chew, Gabriela Campbell  oxyCODONE  (OXY IR/ROXICODONE ) 5 MG immediate release tablet Take 1 tablet (5 mg total) by mouth every 6 (six) hours as needed for severe pain (pain score 7-10). 06/01/23   Geiple, Joshua, Gabriela Campbell  pregabalin  (LYRICA ) 25 MG capsule Take 1 capsule (25 mg total) by mouth 3 (three) times daily. 05/05/23 05/04/24  DanfordLonni SQUIBB, MD  ursodiol  (ACTIGALL ) 300 MG capsule Take 600 mg by mouth in the morning. 02/27/23 03/05/24  [provider]    Allergies: Dilaudid [hydromorphone hcl], Fish allergy, Ibuprofen, Iodine, Penicillins, Shellfish allergy, Shrimp extract, Tramadol , Baclofen ,  Gabapentin , and Codeine    Review of Systems  Musculoskeletal:  Positive for back pain.  All other systems reviewed and are negative.   Updated Vital Signs BP (!) 145/100 (BP Location: Left Arm)   Pulse 79   Temp 98.5 F (36.9 C) (Oral)   Resp 18   SpO2 99%   Physical Exam Vitals and nursing note reviewed.  Constitutional:      General: She is not in acute distress.    Appearance: She is well-developed.  HENT:     Head: Normocephalic and atraumatic.   Eyes:     Conjunctiva/sclera: Conjunctivae normal.    Cardiovascular:     Rate and Rhythm: Normal rate and regular rhythm.     Heart sounds: No murmur heard. Pulmonary:     Effort: Pulmonary effort is normal. No respiratory distress.     Breath sounds: Normal breath sounds.  Abdominal:     Palpations: Abdomen is soft.     Tenderness: There is no abdominal tenderness.   Musculoskeletal:        General: Tenderness and signs of injury present. No swelling or deformity.     Cervical back: Neck supple.     Comments: Tenderness palpation along the lower spine/sacrum.  No obvious bruising, swelling, or step-off deformity seen.  Neurovascular intact.   Skin:    General: Skin is warm and dry.     Capillary Refill: Capillary refill takes less than 2 seconds.   Neurological:  Mental Status: She is alert.     Deep Tendon Reflexes:     Reflex Scores:      Patellar reflexes are 2+ on the right side and 2+ on the left side.      Achilles reflexes are 2+ on the right side and 2+ on the left side.    Comments: 5 out of 5 strength in bilateral lower extremities with the right leg somewhat more uncomfortable with exam.  DTRs are 2+ bilaterally in the patellar and Achilles.  Psychiatric:        Mood and Affect: Mood normal.     (all labs ordered are listed, but only abnormal results are displayed) Labs Reviewed - No data to display  EKG: None  Radiology: No results found.   Procedures   Medications Ordered in the ED   oxyCODONE -acetaminophen  (PERCOCET/ROXICET) 5-325 MG per tablet 1 tablet (1 tablet Oral Given 06/25/23 1417)                                    Medical Decision Making Amount and/or Complexity of Data Reviewed Radiology: ordered.  Risk Prescription drug management.   This patient presents to the ED for concern of fall, back pain.  Differential diagnosis includes disc herniation, compression fracture lumbar spine, sacral fracture, hip fracture   Imaging Studies ordered:  I ordered imaging studies including CT of the lumbar spine I independently visualized and interpreted imaging which showed pending at time of signout I agree with the radiologist interpretation   Medicines ordered and prescription drug management:  I ordered medication including oxycodone  for pain Reevaluation of the patient after these medicines showed that the patient improved I have reviewed the patients home medicines and have made adjustments as needed   Problem List / ED Course:  Patient with past history significant for MS presents to the emergency department concerns of fall and low back pain.  Reports that she sustained mechanical fall after losing her footing earlier today.  Landed on her low back with ongoing pain in this area.  Recently had falls while several months resulting in a rib fracture.  At this time, denies any tingling, numbness, paresthesias in a unilateral extremity but does endorse worsening pain on the right side.  No medications taken prior to arriving.  Not on blood thinners denies any head injury or head impact.  No reported thoracic or cervical spine tenderness.  Denies any feelings of headache, dizziness, lightheadedness On exam, patient has symmetric strength bilaterally in the lower extremity with worsening pain on examination of the right leg.  DTRs are 2+ at the patellar and Achilles bilaterally.  Doubtful of new neurologic deficit.  Given exam, proceed with CT imaging of the  lumbar spine for evaluation of possible fracture or other injury.  Percocet with for pain control.  3:11 PM Care of Gabriela Campbell transferred to [facility] and Dr. Patsey at the end of my shift as the patient will require reassessment once labs/imaging have resulted. Patient presentation, ED course, and plan of care discussed with review of all pertinent labs and imaging. Please see his/her note for further details regarding further ED course and disposition. Plan at time of handoff is disposition per results of CT lumbar. Anticipate discharge home with analgesia. This may be altered or completely changed at the discretion of the oncoming team pending results of further workup.    Social Determinants of Health:  History of MS  Final diagnoses:  None    ED Discharge Orders     None          Gabriela Legrand LABOR, Gabriela Campbell 06/25/23 1511    Gabriela Pac Campbell, Gabriela Campbell 06/27/23 [phone]

## 2023-06-25 NOTE — ED Triage Notes (Signed)
 BIBA from home for mechanical fall, pt has MS, states her legs gave out on her.Pt has lower back tenderness. 140/88 BP 74 HR 96% RA 143 CBG

## 2023-07-07 ENCOUNTER — Observation Stay (HOSPITAL_COMMUNITY)

## 2023-07-07 ENCOUNTER — Encounter (HOSPITAL_COMMUNITY): Payer: Self-pay | Admitting: Emergency Medicine

## 2023-07-07 ENCOUNTER — Observation Stay (HOSPITAL_COMMUNITY)
Admission: EM | Admit: 2023-07-07 | Discharge: 2023-07-10 | Disposition: A | Discharge status: Home / Self Care | Attending: Internal Medicine | Admitting: Internal Medicine

## 2023-07-07 DIAGNOSIS — I10 Essential (primary) hypertension: Secondary | ICD-10-CM | POA: Diagnosis not present

## 2023-07-07 DIAGNOSIS — F17220 Nicotine dependence, chewing tobacco, uncomplicated: Secondary | ICD-10-CM | POA: Diagnosis not present

## 2023-07-07 DIAGNOSIS — E876 Hypokalemia: Secondary | ICD-10-CM | POA: Insufficient documentation

## 2023-07-07 DIAGNOSIS — F17210 Nicotine dependence, cigarettes, uncomplicated: Secondary | ICD-10-CM | POA: Diagnosis not present

## 2023-07-07 DIAGNOSIS — R29898 Other symptoms and signs involving the musculoskeletal system: Secondary | ICD-10-CM | POA: Diagnosis not present

## 2023-07-07 DIAGNOSIS — Z720 Tobacco use: Secondary | ICD-10-CM | POA: Diagnosis present

## 2023-07-07 DIAGNOSIS — R770 Abnormality of albumin: Secondary | ICD-10-CM | POA: Diagnosis not present

## 2023-07-07 DIAGNOSIS — R2689 Other abnormalities of gait and mobility: Secondary | ICD-10-CM | POA: Insufficient documentation

## 2023-07-07 DIAGNOSIS — D751 Secondary polycythemia: Secondary | ICD-10-CM | POA: Insufficient documentation

## 2023-07-07 DIAGNOSIS — R531 Weakness: Secondary | ICD-10-CM | POA: Diagnosis present

## 2023-07-07 DIAGNOSIS — K7400 Hepatic fibrosis, unspecified: Secondary | ICD-10-CM | POA: Insufficient documentation

## 2023-07-07 DIAGNOSIS — G35 Multiple sclerosis: Secondary | ICD-10-CM | POA: Diagnosis not present

## 2023-07-07 DIAGNOSIS — D696 Thrombocytopenia, unspecified: Secondary | ICD-10-CM | POA: Diagnosis not present

## 2023-07-07 DIAGNOSIS — K7402 Hepatic fibrosis, advanced fibrosis: Secondary | ICD-10-CM | POA: Diagnosis present

## 2023-07-07 LAB — CBC WITH DIFFERENTIAL/PLATELET
Abs Immature Granulocytes: 0.02 K/uL (ref 0.00–0.07)
Basophils Absolute: 0 K/uL (ref 0.0–0.1)
Basophils Relative: 0 %
Eosinophils Absolute: 0 K/uL (ref 0.0–0.5)
Eosinophils Relative: 0 %
HCT: 47.4 % — ABNORMAL HIGH (ref 36.0–46.0)
Hemoglobin: 15.9 g/dL — ABNORMAL HIGH (ref 12.0–15.0)
Immature Granulocytes: 0 %
Lymphocytes Relative: 28 %
Lymphs Abs: 2.1 K/uL (ref 0.7–4.0)
MCH: 32.3 pg (ref 26.0–34.0)
MCHC: 33.5 g/dL (ref 30.0–36.0)
MCV: 96.3 fL (ref 80.0–100.0)
Monocytes Absolute: 0.3 K/uL (ref 0.1–1.0)
Monocytes Relative: 4 %
Neutro Abs: 5 K/uL (ref 1.7–7.7)
Neutrophils Relative %: 68 %
Platelets: 151 K/uL (ref 150–400)
RBC: 4.92 MIL/uL (ref 3.87–5.11)
RDW: 13.1 % (ref 11.5–15.5)
WBC: 7.5 K/uL (ref 4.0–10.5)
nRBC: 0 % (ref 0.0–0.2)

## 2023-07-07 LAB — URINALYSIS, W/ REFLEX TO CULTURE (INFECTION SUSPECTED)
Bilirubin Urine: NEGATIVE
Glucose, UA: NEGATIVE mg/dL
Ketones, ur: NEGATIVE mg/dL
Leukocytes,Ua: NEGATIVE
Nitrite: NEGATIVE
Protein, ur: NEGATIVE mg/dL
Specific Gravity, Urine: 1.012 (ref 1.005–1.030)
pH: 5 (ref 5.0–8.0)

## 2023-07-07 LAB — BASIC METABOLIC PANEL WITH GFR
Anion gap: 11 (ref 5–15)
BUN: 14 mg/dL (ref 6–20)
CO2: 27 mmol/L (ref 22–32)
Calcium: 9.1 mg/dL (ref 8.9–10.3)
Chloride: 99 mmol/L (ref 98–111)
Creatinine, Ser: 0.64 mg/dL (ref 0.44–1.00)
GFR, Estimated: >60 mL/min (ref >60)
Glucose, Bld: 102 mg/dL — ABNORMAL HIGH (ref 70–99)
Potassium: 3.6 mmol/L (ref 3.5–5.1)
Sodium: 137 mmol/L (ref 135–145)

## 2023-07-07 LAB — TROPONIN I (HIGH SENSITIVITY): Troponin I (High Sensitivity): 4 ng/L (ref <18)

## 2023-07-07 LAB — TSH: TSH: 0.853 u[IU]/mL (ref 0.350–4.500)

## 2023-07-07 MED ORDER — SODIUM CHLORIDE 0.9 % IV BOLUS
1000.0000 mL | Freq: Once | INTRAVENOUS | Status: AC
  Administered 2023-07-07: 1000 mL via INTRAVENOUS

## 2023-07-07 MED ORDER — DOCUSATE SODIUM 100 MG PO CAPS
100.0000 mg | ORAL_CAPSULE | Freq: Two times a day (BID) | ORAL | Status: DC
Start: 2023-07-07 — End: 2023-07-10
  Administered 2023-07-08 – 2023-07-10 (×5): 100 mg via ORAL
  Filled 2023-07-07 (×5): qty 1

## 2023-07-07 MED ORDER — CYCLOBENZAPRINE HCL 10 MG PO TABS
10.0000 mg | ORAL_TABLET | Freq: Once | ORAL | Status: AC
  Administered 2023-07-07: 10 mg via ORAL
  Filled 2023-07-07: qty 1

## 2023-07-07 MED ORDER — AMLODIPINE BESYLATE 5 MG PO TABS
5.0000 mg | ORAL_TABLET | Freq: Every morning | ORAL | Status: DC
  Administered 2023-07-08 – 2023-07-10 (×3): 5 mg via ORAL
  Filled 2023-07-07 (×3): qty 1

## 2023-07-07 MED ORDER — ACETAMINOPHEN 325 MG PO TABS
650.0000 mg | ORAL_TABLET | Freq: Four times a day (QID) | ORAL | Status: DC | PRN
  Filled 2023-07-07: qty 2

## 2023-07-07 MED ORDER — ONDANSETRON HCL 4 MG PO TABS
4.0000 mg | ORAL_TABLET | Freq: Four times a day (QID) | ORAL | Status: DC | PRN
Start: 2023-07-07 — End: 2023-07-10

## 2023-07-07 MED ORDER — ONDANSETRON HCL 4 MG/2ML IJ SOLN: 4.0000 mg | Freq: Four times a day (QID) | INTRAMUSCULAR | Status: DC | PRN

## 2023-07-07 MED ORDER — FENTANYL CITRATE PF 50 MCG/ML IJ SOSY
12.5000 ug | PREFILLED_SYRINGE | INTRAMUSCULAR | Status: DC | PRN
  Administered 2023-07-08: 12.5 ug via INTRAVENOUS
  Administered 2023-07-08: 25 ug via INTRAVENOUS
  Administered 2023-07-08: 50 ug via INTRAVENOUS
  Administered 2023-07-08 – 2023-07-09 (×4): 25 ug via INTRAVENOUS
  Filled 2023-07-07 (×7): qty 1

## 2023-07-07 MED ORDER — ALBUTEROL SULFATE (2.5 MG/3ML) 0.083% IN NEBU: 2.5000 mg | INHALATION_SOLUTION | RESPIRATORY_TRACT | Status: DC | PRN

## 2023-07-07 MED ORDER — ACETAMINOPHEN 650 MG RE SUPP: 650.0000 mg | Freq: Four times a day (QID) | RECTAL | Status: DC | PRN

## 2023-07-07 MED ORDER — OXYCODONE HCL 5 MG PO TABS
5.0000 mg | ORAL_TABLET | Freq: Once | ORAL | Status: AC
  Administered 2023-07-07: 5 mg via ORAL
  Filled 2023-07-07: qty 1

## 2023-07-07 MED ORDER — LORAZEPAM 1 MG PO TABS
0.5000 mg | ORAL_TABLET | Freq: Once | ORAL | Status: AC
  Administered 2023-07-07: 0.5 mg via ORAL
  Filled 2023-07-07: qty 1

## 2023-07-07 MED ORDER — POLYETHYLENE GLYCOL 3350 17 G PO PACK: 17.0000 g | PACK | Freq: Every day | ORAL | Status: DC | PRN

## 2023-07-07 MED ORDER — SODIUM CHLORIDE 0.9 % IV SOLN: INTRAVENOUS | Status: DC

## 2023-07-07 MED ORDER — GADOBUTROL 1 MMOL/ML IV SOLN: 4.0000 mL | Freq: Once | INTRAVENOUS | Status: DC | PRN

## 2023-07-07 MED ORDER — METHOCARBAMOL 500 MG PO TABS: 500.0000 mg | ORAL_TABLET | Freq: Three times a day (TID) | ORAL | Status: DC | PRN

## 2023-07-07 MED ORDER — SENNA 8.6 MG PO TABS
1.0000 | ORAL_TABLET | Freq: Two times a day (BID) | ORAL | Status: DC
Start: 2023-07-07 — End: 2023-07-10
  Administered 2023-07-08 – 2023-07-10 (×5): 8.6 mg via ORAL
  Filled 2023-07-07 (×5): qty 1

## 2023-07-07 MED ORDER — OXYCODONE HCL 5 MG PO TABS
5.0000 mg | ORAL_TABLET | Freq: Four times a day (QID) | ORAL | Status: DC | PRN
  Administered 2023-07-07 – 2023-07-09 (×5): 5 mg via ORAL
  Filled 2023-07-07 (×6): qty 1

## 2023-07-07 MED ORDER — BISACODYL 10 MG RE SUPP: 10.0000 mg | Freq: Every day | RECTAL | Status: DC | PRN

## 2023-07-07 NOTE — Assessment & Plan Note (Signed)
 In the setting of ongoing tobacco abuse

## 2023-07-07 NOTE — ED Notes (Signed)
 Pt provided with graham crackers and water. No other request at this time call bell in reach. Pt medicated for pain.

## 2023-07-07 NOTE — Assessment & Plan Note (Signed)
-   Spoke about importance of quitting spent 5 minutes discussing options for treatment, prior attempts at quitting, and dangers of smoking  -At this point patient is   interested in quitting  - declines nicotine patch   - nursing tobacco cessation protocol

## 2023-07-07 NOTE — Subjective & Objective (Signed)
 Comes in from home has been having weakness for the past 2 days but worse today reports nausea and decreased appetite no vomiting no diarrhea She has known history of multiple sclerosis blood pressure 168/90 pulse 79 Also endorses that her legs are hurting 10 out of 10 hips down bilaterally with no injury Weakness walking around home Case discussed with neurology Dr. Michaela who recommends MRI

## 2023-07-07 NOTE — H&P (Signed)
 Gabriela Campbell FMW:[id number] DOB: 11/28/1965 DOA: 07/07/2023     PCP: [facility], [facility]   Outpatient Specialists:     NEurology  [facility]   Patient arrived to ER on 07/07/23 at 1310 Referred by Attending Mannie Fairy DASEN, DO   Patient coming from:    home Lives with father  Chief Complaint:   Chief Complaint  Patient presents with   Weakness    HPI: Gabriela Campbell is a 58 y.o. female with medical history significant of hypertension multiple sclerosis Neuropathy, history of shingles, he rated Jerel, hemoglobin cytosis, polycythemia history of tobacco abuse  Presented with   lower extremity weakness Comes in from home has been having weakness for the past 2 days but worse today reports nausea and decreased appetite no vomiting no diarrhea She has known history of multiple sclerosis blood pressure 168/90 pulse 79 Also endorses that her legs are hurting 10 out of 10 hips down bilaterally with no injury Weakness walking around home Case discussed with neurology Dr. Michaela who recommends MRI     Denies significant ETOH intake   Does  smoke  but interested in quitting       Regarding pertinent Chronic problems:      HTN on Norvasc     * Liver disease MELD 3.0: 8 at 04/10/2023  4:07 AM MELD-Na: 6 at 04/10/2023  4:07 AM Calculated from: Serum Creatinine: 0.65 mg/dL (Using min of 1 mg/dL) at 5/0/7974  5:92 AM Serum Sodium: 137 mmol/L at 04/10/2023  4:07 AM Total Bilirubin: 0.5 mg/dL (Using min of 1 mg/dL) at 05/01/7972  4:59 AM Serum Albumin: 3.4 g/dL at 05/01/7972  4:59 AM INR(ratio): 0.9 (Using min of 1) at 04/08/2023 12:13 PM Age at listing (hypothetical): 57 years Sex: Female at 04/10/2023  4:07 AM  Hepatic Function Panel     Component Value Date/Time   PROT 7.3 06/06/2023 1156   PROT 6.3 01/22/2022 1431   ALBUMIN 3.6 06/06/2023 1156   ALBUMIN 4.1 01/22/2022 1431   ALBUMIN 4.2 04/16/2017 1143   AST 18 06/06/2023 1156   AST 184 (HH) 07/29/2020 1034    ALT 18 06/06/2023 1156   ALT 143 (H) 07/29/2020 1034   ALKPHOS 285 (H) 06/06/2023 1156   BILITOT 0.5 06/06/2023 1156   BILITOT 0.3 01/22/2022 1431   BILITOT 0.5 07/29/2020 1034   BILIDIR 0.5 (H) 02/14/2023 0500   BILIDIR 0.06 04/21/2018 1328   IBILI 0.5 02/14/2023 0500   0.9   Multiple sclerosis  While in ER:     Discussed case with neurology who recommends admission for supportive management get MRI in the morning if abnormal will need high-dose steroids    Lab Orders         CBC with Differential         TSH         Urinalysis, w/ Reflex to Culture (Infection Suspected) -Urine, Clean Catch         Basic metabolic panel with GFR        Following Medications were ordered in ER: Medications  sodium chloride  0.9 % bolus 1,000 mL (1,000 mLs Intravenous New Bag/Given 07/07/23 1439)  cyclobenzaprine  (FLEXERIL ) tablet 10 mg (10 mg Oral Given 07/07/23 1434)  oxyCODONE  (Oxy IR/ROXICODONE ) immediate release tablet 5 mg (5 mg Oral Given 07/07/23 1902)    _______________________________________________________ ER Provider Called:    Neurology Dr. Michaela They Recommend admit to medicine   ER to ER transfer for MRI brain cervical spine and thoracic  spine without contrast to further evaluate if there is an active MS flare     ED Triage Vitals  Encounter Vitals Group     BP 07/07/23 1319 (!) 146/89     Girls Systolic BP Percentile --      Girls Diastolic BP Percentile --      Boys Systolic BP Percentile --      Boys Diastolic BP Percentile --      Pulse Rate 07/07/23 1319 64     Resp 07/07/23 1322 18     Temp 07/07/23 1322 98.2 F (36.8 C)     Temp Source 07/07/23 1322 Oral     SpO2 07/07/23 1319 98 %     Weight --      Height --      Head Circumference --      Peak Flow --      Pain Score 07/07/23 1902 10     Pain Loc --      Pain Education --      Exclude from Growth Chart --   UFJK(75)@     _________________________________________ Significant initial   Findings: Abnormal Labs Reviewed  CBC WITH DIFFERENTIAL/PLATELET - Abnormal; Notable for the following components:      Result Value   Hemoglobin 15.9 (*)    HCT 47.4 (*)    All other components within normal limits  URINALYSIS, W/ REFLEX TO CULTURE (INFECTION SUSPECTED) - Abnormal; Notable for the following components:   APPearance HAZY (*)    Hgb urine dipstick SMALL (*)    Bacteria, UA RARE (*)    All other components within normal limits  BASIC METABOLIC PANEL WITH GFR - Abnormal; Notable for the following components:   Glucose, Bld 102 (*)    All other components within normal limits      _________________________ Troponin  ordered Cardiac Panel (last 3 results) Recent Labs    07/07/23 1743  TROPONINIHS 4     ECG: Ordered Personally reviewed and interpreted by me showing: HR : 65 Rhythm: Sinus rhythm Borderline intraventricular conduction delay Borderline repolarization abnormality QTC 422    The recent clinical data is shown below. Vitals:   07/07/23 1750 07/07/23 1800 07/07/23 1830 07/07/23 1900  BP: 137/85 (!) 141/95  104/87  Pulse: 82 (!) 133 75 82  Resp: 16   18  Temp:    98.4 F (36.9 C)  TempSrc:    Oral  SpO2: 100% 90% 99% 97%    WBC     Component Value Date/Time   WBC 7.5 07/07/2023 1354   LYMPHSABS 2.1 07/07/2023 1354   LYMPHSABS 1.9 01/22/2022 1431   MONOABS 0.3 07/07/2023 1354   EOSABS 0.0 07/07/2023 1354   EOSABS 0.1 01/22/2022 1431   BASOSABS 0.0 07/07/2023 1354   BASOSABS 0.0 01/22/2022 1431     UA   no evidence of UTI    Urine analysis:    Component Value Date/Time   COLORURINE YELLOW 07/07/2023 1612   APPEARANCEUR HAZY (A) 07/07/2023 1612   LABSPEC 1.012 07/07/2023 1612   PHURINE 5.0 07/07/2023 1612   GLUCOSEU NEGATIVE 07/07/2023 1612   HGBUR SMALL (A) 07/07/2023 1612   BILIRUBINUR NEGATIVE 07/07/2023 1612   KETONESUR NEGATIVE 07/07/2023 1612   PROTEINUR NEGATIVE 07/07/2023 1612   NITRITE NEGATIVE 07/07/2023 1612    LEUKOCYTESUR NEGATIVE 07/07/2023 1612    Results for orders placed or performed during the hospital encounter of 02/13/23  Urine Culture     Status: Abnormal   Collection  Time: 02/13/23  1:39 AM   Specimen: Urine, Random  Result Value Ref Range Status   Specimen Description   Final    URINE, RANDOM Performed at [facility], [address], [place], [place] [address]    Special Requests   Final    NONE Reflexed from [id number] Performed at [facility], [address], [place], [place] [address]    Culture (A)  Final    >=100,000 COLONIES/mL AEROCOCCUS SPECIES Standardized susceptibility testing for this organism is not available. Performed at [facility], [address], [place], [place] [address]    Report Status 02/17/2023 FINAL  Final    __________________________________________________________ Recent Labs  Lab 07/07/23 1743  NA 137  K 3.6  CO2 27  GLUCOSE 102*  BUN 14  CREATININE 0.64  CALCIUM 9.1    Cr   stable,  Lab Results  Component Value Date   CREATININE 0.64 07/07/2023   CREATININE 0.54 06/06/2023   CREATININE 0.76 05/05/2023    No results for input(s): AST, ALT, ALKPHOS, BILITOT, PROT, ALBUMIN in the last 168 hours. Lab Results  Component Value Date   CALCIUM 9.1 07/07/2023   PHOS 2.9 04/08/2023       Plt: Lab Results  Component Value Date   PLT 151 07/07/2023       Recent Labs  Lab 07/07/23 1354  WBC 7.5  NEUTROABS 5.0  HGB 15.9*  HCT 47.4*  MCV 96.3  PLT 151    HG/HCT  stable     Component Value Date/Time   HGB 15.9 (H) 07/07/2023 1354   HGB 13.5 01/22/2022 1431   HCT 47.4 (H) 07/07/2023 1354   HCT 39.4 01/22/2022 1431   MCV 96.3 07/07/2023 1354   MCV 95 01/22/2022 1431   _______________________________________ Hospitalist was called for admission for   Weakness of both lower extremities    Multiple sclerosis    The following Work up has been ordered so  far:  Orders Placed This Encounter  Procedures   CBC with Differential   TSH   Urinalysis, w/ Reflex to Culture (Infection Suspected) -Urine, Clean Catch   Basic metabolic panel with GFR   Consult to neurology   Consult to hospitalist   ED EKG   EKG 12-Lead     OTHER Significant initial  Findings:  labs showing:     DM  labs:  HbA1C: Recent Labs    04/09/23 1732  HGBA1C 5.5       CBG (last 3)  No results for input(s): GLUCAP in the last 72 hours.        Cultures:    Component Value Date/Time   SDES  02/13/2023 0139    URINE, RANDOM Performed at [facility], [address], [place], [place] [address]    SPECREQUEST  02/13/2023 0139    NONE Reflexed from [id number] Performed at [facility], [address], [place], [place] [address]    CULT (A) 02/13/2023 0139    >=100,000 COLONIES/mL AEROCOCCUS SPECIES Standardized susceptibility testing for this organism is not available. Performed at [facility], [address], [place], [place] [address]    REPTSTATUS 02/17/2023 FINAL 02/13/2023 0139     Radiological Exams on Admission: No results found. _______________________________________________________________________________________________________ Latest  Blood pressure 104/87, pulse 82, temperature 98.4 F (36.9 C), temperature source Oral, resp. rate 18, SpO2 97%.   Vitals  labs and radiology finding personally reviewed  Review of Systems:  Pertinent positives include:   fatigue,  Constitutional:  No weight loss, night sweats, Fevers, chills, weight loss  HEENT:  No headaches, Difficulty swallowing,Tooth/dental problems,Sore throat,  No sneezing, itching, ear ache, nasal congestion, post nasal drip,  Cardio-vascular:  No chest pain, Orthopnea, PND, anasarca, dizziness, palpitations.no Bilateral lower extremity swelling  GI:  No heartburn, indigestion, abdominal pain, nausea, vomiting, diarrhea, change in  bowel habits, loss of appetite, melena, blood in stool, hematemesis Resp:  no shortness of breath at rest. No dyspnea on exertion, No excess mucus, no productive cough, No non-productive cough, No coughing up of blood.No change in color of mucus.No wheezing. Skin:  no rash or lesions. No jaundice GU:  no dysuria, change in color of urine, no urgency or frequency. No straining to urinate.  No flank pain.  Musculoskeletal:  No joint pain or no joint swelling. No decreased range of motion. No back pain.  Psych:  No change in mood or affect. No depression or anxiety. No memory loss.  Neuro: no localizing neurological complaints, no tingling, no weakness, no double vision, no gait abnormality, no slurred speech, no confusion  All systems reviewed and apart from HOPI all are negative _______________________________________________________________________________________________ Past Medical History:   Past Medical History:  Diagnosis Date   Allergy    Essential hypertension    Multiple sclerosis (HCC)    Shingles    Weakness       Past Surgical History:  Procedure Laterality Date   ABDOMINAL HYSTERECTOMY     CHOLECYSTECTOMY      Social History:  Ambulatory  walker        reports that she has been smoking cigarettes. She has never used smokeless tobacco. She reports that she does not drink alcohol and does not use drugs.     Family History:   Family History  Problem Relation Age of Onset   Cancer Paternal Grandmother    Cancer Paternal Aunt    Cancer Paternal Aunt    Colon cancer Neg Hx    Stomach cancer Neg Hx    Esophageal cancer Neg Hx    Pancreatic cancer Neg Hx    ______________________________________________________________________________________________ Allergies: Allergies  Allergen Reactions   Dilaudid [Hydromorphone Hcl] Anaphylaxis   Fish Allergy Anaphylaxis   Ibuprofen Anaphylaxis   Iodine Anaphylaxis   Penicillins Anaphylaxis   Shellfish Allergy  Anaphylaxis   Shrimp Extract Anaphylaxis   Tramadol  Nausea And Vomiting and Anaphylaxis   Baclofen  Other (See Comments)    Makes the patient feel wobbly   Gabapentin  Other (See Comments)    GI upset   Codeine Nausea Only     Prior to Admission medications   Medication Sig Start Date End Date Taking? Authorizing Provider  amLODipine  (NORVASC ) 5 MG tablet Take 0.5 tablets (2.5 mg total) by mouth daily. Patient taking differently: Take 5 mg by mouth in the morning. 02/14/23   Laurence Locus, DO  Cholecalciferol  (VITAMIN D3) 25 MCG (1000 UT) CAPS Take 2,000 Units by mouth daily.    [provider]  Cyanocobalamin  (VITAMIN B12 PO) Take 1 tablet by mouth daily.    [provider]  methocarbamol  (ROBAXIN ) 500 MG tablet Take 1 tablet (500 mg total) by mouth every 8 (eight) hours as needed for muscle spasms. 06/01/23   Desiderio Chew, PA-C  oxyCODONE  (OXY IR/ROXICODONE ) 5 MG immediate release tablet Take 1 tablet (5 mg total) by mouth every 6 (six) hours as needed for severe pain (pain score 7-10). 06/01/23   Geiple, Joshua, PA-C  pregabalin  (LYRICA ) 25 MG capsule Take 1 capsule (25 mg total) by mouth 3 (three) times daily. 05/05/23 05/04/24  DanfordLonni SQUIBB, MD  ursodiol  (ACTIGALL ) 300 MG capsule Take 600 mg by mouth in the morning. 02/27/23 03/05/24  [provider]    ___________________________________________________________________________________________________ Physical Exam:    07/07/2023    7:00 PM 07/07/2023    6:30 PM 07/07/2023    6:00 PM  Vitals with BMI  Systolic 104  141  Diastolic 87  95  Pulse 82 75 133     1. General:  in No  Acute distress    Chronically ill  -appearing 2. Psychological: Alert and   Oriented 3. Head/ENT:   Dry Mucous Membranes                          Head Non traumatic, neck supple                          Poor Dentition 4. SKIN: decreased Skin turgor,  Skin clean Dry and intact no rash    5. Heart: Regular rate and rhythm no   Murmur, no Rub or gallop 6. Lungs: , no wheezes or crackles   7. Abdomen: Soft,  non-tender, Non distended bowel sounds present 8. Lower extremities: no clubbing, cyanosis, no  edema 9. Neurologically patient appears to have worsening foot drop on right versus left and generalized fatigue in both extremities bilaterally 10. MSK: Normal range of motion    Chart has been reviewed  ______________________________________________________________________________________________  Assessment/Plan  58 y.o. female with medical history significant of hypertension multiple sclerosis Neuropathy, history of shingles, he rated Terry, hemoglobin cytosis, polycythemia history of tobacco abuse   Admitted for   Weakness of both lower extremities   Multiple sclerosis (HCC)    Present on Admission:  Multiple sclerosis (HCC)  Relapsing remitting multiple sclerosis (HCC)  Polycythemia  Tobacco use  Bilateral leg weakness  Essential hypertension  Hepatic fibrosis, advanced fibrosis     Relapsing remitting multiple sclerosis (HCC) Presents with bilateral lower extremity weakness.  Neurology has been consulted plan for ER to ER transfer for MRI brain cervical and thoracic spine if evidence of MS flare will need IV steroids otherwise supportive management PT OT evaluation  Polycythemia In the setting of ongoing tobacco abuse  Tobacco use  - Spoke about importance of quitting spent 5 minutes discussing options for treatment, prior attempts at quitting, and dangers of smoking  -At this point patient is    interested in quitting  - declines nicotine  patch   - nursing tobacco cessation protocol   Bilateral leg weakness MRI brain cervical and thoracic spine as per neurology appreciate input  Essential hypertension Resume Norvasc  2.5 mg daily  Hepatic fibrosis, advanced fibrosis Check liver function    Other plan as per orders.  DVT prophylaxis:  SCD      Code Status:    Code Status: Prior  FULL CODE  as per patient  I had personally discussed CODE STATUS with patient   ACP   none    Family Communication:   Family not at  Bedside    Diet regular   Disposition Plan:      To home once workup is complete and patient is stable   Following barriers for discharge:  Will need consultants to evaluate patient prior to discharge                       Consult Orders  (From admission, onward)           Start     Ordered   07/07/23 1903  Consult to hospitalist  Once       Provider:  (Not yet assigned)  Question Answer Comment  Place call to: [facility]   Reason for Consult Admit      07/07/23 1902                               Would benefit from PT/OT eval prior to DC  Ordered                                Consults called: Neurology    Admission status:  ED Disposition     ED Disposition  Admit   Condition  --   Comment  Hospital Area: [facility] [[id number]]  Level of Care: Med-Surg [16]  May place patient in observation at [facility] or [facility] if equivalent level of care is available:: No  Covid Evaluation: Asymptomatic - no recent exposure (last 10 days) testing not required  Diagnosis: Multiple sclerosis (HCC) [340.ICD-9-CM]  Admitting Physician: Webber Michiels [[id number]]  Attending Physician: Zela Sobieski [[id number]]           Obs      Level of care      medical floor      separately billable procedures: 60*  Minutes.    Macaila Tahir 07/07/2023, 8:07 PM    [facility]     after 2 AM please page floor coverage   If 7AM-7PM, please contact the day team taking care of the patient using [email or web address]

## 2023-07-07 NOTE — ED Notes (Signed)
 PT arrived via transfer from [place] for MRI. PT alert and talking and VSS. PT has IV continued and needs MRI for lower leg weakness. PT walked to bed under own power from EMS stretcher

## 2023-07-07 NOTE — Assessment & Plan Note (Signed)
 Presents with bilateral lower extremity weakness.  Neurology has been consulted plan for ER to ER transfer for MRI brain cervical and thoracic spine if evidence of MS flare will need IV steroids otherwise supportive management PT OT evaluation

## 2023-07-07 NOTE — ED Provider Notes (Addendum)
 [place] EMERGENCY DEPARTMENT AT [facility] Provider Note  CSN: [id number] Arrival date & time: 07/07/23 1310  Chief Complaint(s) Weakness  HPI Gabriela Campbell is a 58 y.o. female with history of MS who is here today for acute on chronic lower extremity weakness.  [name] is over the last 3 to 4 days, she has been having increasing weakness in her lower extremities, has had more difficulty walking around at home.  She does also endorse some pain in her lower extremities.  No recent trauma.   Past Medical History Past Medical History:  Diagnosis Date   Allergy    Essential hypertension    Multiple sclerosis (HCC)    Shingles    Weakness    Patient Active Problem List   Diagnosis Date Noted   Generalized weakness 05/04/2023   DNR (do not resuscitate) 04/12/2023   History of multiple sclerosis (HCC) 04/08/2023   Tobacco dependence syndrome 04/08/2023   Tobacco use 04/08/2023   Hepatic fibrosis, advanced fibrosis 03/03/2023   Scoliosis deformity of spine 02/27/2023   Bilateral leg weakness 07/28/2022   Chronic low back pain 07/28/2022   Essential hypertension 07/28/2022   Hypokalemia 07/28/2022   Polycythemia 07/28/2022   left leg pain 06/29/2021   Hereditary hemochromatosis (HCC) 07/29/2020   Elevated liver enzymes 05/31/2020   Vitamin D  deficiency 02/18/2018   Relapsing remitting multiple sclerosis (HCC) 03/28/2017   Gait abnormality 03/14/2017   Smoker 10/10/2016   Leg weakness, bilateral 10/10/2016   Home Medication(s) Prior to Admission medications   Medication Sig Start Date End Date Taking? Authorizing Provider  amLODipine  (NORVASC ) 5 MG tablet Take 0.5 tablets (2.5 mg total) by mouth daily. Patient taking differently: Take 5 mg by mouth in the morning. 02/14/23   Laurence Locus, DO  Cholecalciferol  (VITAMIN D3) 25 MCG (1000 UT) CAPS Take 2,000 Units by mouth daily.    [provider]  Cyanocobalamin  (VITAMIN B12 PO) Take 1 tablet by mouth  daily.    [provider]  methocarbamol  (ROBAXIN ) 500 MG tablet Take 1 tablet (500 mg total) by mouth every 8 (eight) hours as needed for muscle spasms. 06/01/23   Desiderio Chew, PA-C  oxyCODONE  (OXY IR/ROXICODONE ) 5 MG immediate release tablet Take 1 tablet (5 mg total) by mouth every 6 (six) hours as needed for severe pain (pain score 7-10). 06/01/23   Geiple, Joshua, PA-C  pregabalin  (LYRICA ) 25 MG capsule Take 1 capsule (25 mg total) by mouth 3 (three) times daily. 05/05/23 05/04/24  DanfordLonni SQUIBB, MD  ursodiol  (ACTIGALL ) 300 MG capsule Take 600 mg by mouth in the morning. 02/27/23 03/05/24  [provider]                                                                                                                                    Past Surgical History Past Surgical History:  Procedure Laterality Date   ABDOMINAL HYSTERECTOMY  CHOLECYSTECTOMY     Family History Family History  Problem Relation Age of Onset   Cancer Paternal Grandmother    Cancer Paternal Aunt    Cancer Paternal Aunt    Colon cancer Neg Hx    Stomach cancer Neg Hx    Esophageal cancer Neg Hx    Pancreatic cancer Neg Hx     Social History Social History   Tobacco Use   Smoking status: Every Day    Current packs/day: 0.50    Types: Cigarettes   Smokeless tobacco: Never  Vaping Use   Vaping status: Never Used  Substance Use Topics   Alcohol use: No   Drug use: No   Allergies Dilaudid [hydromorphone hcl], Fish allergy, Ibuprofen, Iodine, Penicillins, Shellfish allergy, Shrimp extract, Tramadol , Baclofen , Gabapentin , and Codeine  Review of Systems Review of Systems  Physical Exam Vital Signs  I have reviewed the triage vital signs BP 104/87   Pulse 82   Temp 98.3 F (36.8 C) (Oral)   Resp 16   SpO2 97%   Physical Exam Vitals and nursing note reviewed.  HENT:     Head: Normocephalic.  Cardiovascular:     Rate and Rhythm: Normal rate.     Pulses: Normal pulses.   Pulmonary:     Effort: Pulmonary effort is normal.  Abdominal:     General: Abdomen is flat.     Palpations: Abdomen is soft.  Neurological:     Mental Status: She is alert.     Comments: 45 strength bilaterally with straight leg raise, plantar and dorsi flexion.  Intact sensation in the bilateral lower extremities.     ED Results and Treatments Labs (all labs ordered are listed, but only abnormal results are displayed) Labs Reviewed  CBC WITH DIFFERENTIAL/PLATELET - Abnormal; Notable for the following components:      Result Value   Hemoglobin 15.9 (*)    HCT 47.4 (*)    All other components within normal limits  URINALYSIS, W/ REFLEX TO CULTURE (INFECTION SUSPECTED) - Abnormal; Notable for the following components:   APPearance HAZY (*)    Hgb urine dipstick SMALL (*)    Bacteria, UA RARE (*)    All other components within normal limits  BASIC METABOLIC PANEL WITH GFR - Abnormal; Notable for the following components:   Glucose, Bld 102 (*)    All other components within normal limits  TSH  TROPONIN I (HIGH SENSITIVITY)                                                                                                                          Radiology No results found.  Pertinent labs & imaging results that were available during my care of the patient were reviewed by me and considered in my medical decision making (see MDM for details).  Medications Ordered in ED Medications  sodium chloride  0.9 % bolus 1,000 mL (1,000 mLs Intravenous New Bag/Given 07/07/23 1439)  cyclobenzaprine  (FLEXERIL ) tablet 10 mg (  10 mg Oral Given 07/07/23 1434)  oxyCODONE  (Oxy IR/ROXICODONE ) immediate release tablet 5 mg (5 mg Oral Given 07/07/23 1902)                                                                                                                                     Procedures Procedures  (including critical care time)  Medical Decision Making / ED Course   This patient presents  to the ED for concern of weakness, this involves an extensive number of treatment options, and is a complaint that carries with it a high risk of complications and morbidity.  The differential diagnosis includes MS flare, electrolyte abnormalities, underlying infection, less likely cord compression, less likely CVA.  MDM: Patient without any lateralizing deficits, has some lower extremity weakness.  Looking through her chart, it appears as though this has been an issue for her previously, considered likely progressive multiple sclerosis.  Will check basic labs on the patient, however believe this is likely related to her MS.  Will plan to reach out to neurology.  Reassessment 7 PM-patient's urinalysis negative, blood work overall normal.  Reached out to neurology, spoke with Dr. Michaela.  Recommend MRI to differentiate between pseudo flare or MS flare in order to determine whether or not patient should require high-dose steroids.  We do not have MRI access at this time, but will tomorrow.  Given patient's functional decline, she would not yet be appropriate for discharge, so do not see utility in transferring her to [facility] at this time for imaging when she likely will still require admission.  Will admit to hospitalist.  Reassessment 7:30 PM-neurology requesting imaging this evening, guide treatment.  I reached out to one of our ED attendings at [facility], Dr. Franklyn who has accepted patient as transfer.  Will work on getting patient ride over to the ED.  Appropriate MRIs ordered.  Additional history obtained: -Additional history obtained from -External records from outside source obtained and reviewed including: Chart review including previous notes, labs, imaging, consultation notes   Lab Tests: -I ordered, reviewed, and interpreted labs.   The pertinent results include:   Labs Reviewed  CBC WITH DIFFERENTIAL/PLATELET - Abnormal; Notable for the following components:      Result Value    Hemoglobin 15.9 (*)    HCT 47.4 (*)    All other components within normal limits  URINALYSIS, W/ REFLEX TO CULTURE (INFECTION SUSPECTED) - Abnormal; Notable for the following components:   APPearance HAZY (*)    Hgb urine dipstick SMALL (*)    Bacteria, UA RARE (*)    All other components within normal limits  BASIC METABOLIC PANEL WITH GFR - Abnormal; Notable for the following components:   Glucose, Bld 102 (*)    All other components within normal limits  TSH  TROPONIN I (HIGH SENSITIVITY)      EKG my independent review of the patient's EKG shows no  ST segment depressions or elevations, no T wave inversions, no evidence of acute ischemia.  EKG Interpretation Date/Time:  Sunday July 07 2023 14:12:23 EDT Ventricular Rate:  65 PR Interval:  196 QRS Duration:  113 QT Interval:  405 QTC Calculation: 422 R Axis:   86  Text Interpretation: Sinus rhythm Borderline intraventricular conduction delay Borderline repolarization abnormality Confirmed by Mannie Pac [phone]) on 07/07/2023 2:20:24 PM         Imaging Studies ordered:  I independently visualized and interpreted imaging. I agree with the radiologist interpretation   Medicines ordered and prescription drug management: Meds ordered this encounter  Medications   sodium chloride  0.9 % bolus 1,000 mL   cyclobenzaprine  (FLEXERIL ) tablet 10 mg   oxyCODONE  (Oxy IR/ROXICODONE ) immediate release tablet 5 mg    Refill:  0    -I have reviewed the patients home medicines and have made adjustments as needed   Cardiac Monitoring: The patient was maintained on a cardiac monitor.  I personally viewed and interpreted the cardiac monitored which showed an underlying rhythm of: Normal sinus rhythm  Social Determinants of Health:  Factors impacting patients care include:    Reevaluation: After the interventions noted above, I reevaluated the patient and found that they have :improved  Co morbidities that complicate the patient  evaluation  Past Medical History:  Diagnosis Date   Allergy    Essential hypertension    Multiple sclerosis (HCC)    Shingles    Weakness        Final Clinical Impression(s) / ED Diagnoses Final diagnoses:  Weakness of both lower extremities  Multiple sclerosis (HCC)     @PCDICTATION @    Mannie Pac T, DO 07/07/23 1905    Mannie Pac T, DO 07/07/23 1950

## 2023-07-07 NOTE — Assessment & Plan Note (Signed)
 Resume Norvasc 2.5 mg daily

## 2023-07-07 NOTE — ED Notes (Signed)
 [facility] to transport pt ED to ED for MRI

## 2023-07-07 NOTE — ED Notes (Signed)
 Bedside commonde placed in pt room.

## 2023-07-07 NOTE — Assessment & Plan Note (Signed)
Check liver function 

## 2023-07-07 NOTE — ED Triage Notes (Signed)
 PT BIB EMS from home, weakness x 2 days increasing worse today. Nausea and decreased appetite. Denies vomiting and diarrhea. Denies any fall or injuries  Hx of MS but doesn't know what it feels like to have a MS flare up.Gabriela Campbell   EMS VS  168/90 79 pulse 99% RA CBG 129

## 2023-07-07 NOTE — ED Notes (Signed)
 Pt states both legs are hurting 10/10 from hip to toes and no injury

## 2023-07-07 NOTE — ED Notes (Signed)
 Patient transported to MRI

## 2023-07-07 NOTE — Assessment & Plan Note (Signed)
 MRI brain cervical and thoracic spine as per neurology appreciate input

## 2023-07-07 NOTE — ED Notes (Signed)
 Pt assisted with using bedside commode. Urine sample collected.

## 2023-07-08 ENCOUNTER — Other Ambulatory Visit: Payer: Self-pay

## 2023-07-08 ENCOUNTER — Observation Stay (HOSPITAL_COMMUNITY)

## 2023-07-08 DIAGNOSIS — D751 Secondary polycythemia: Secondary | ICD-10-CM | POA: Diagnosis not present

## 2023-07-08 DIAGNOSIS — G35 Multiple sclerosis: Secondary | ICD-10-CM | POA: Diagnosis not present

## 2023-07-08 DIAGNOSIS — R29898 Other symptoms and signs involving the musculoskeletal system: Secondary | ICD-10-CM | POA: Diagnosis not present

## 2023-07-08 LAB — COMPREHENSIVE METABOLIC PANEL WITH GFR
ALT: 26 U/L (ref 0–44)
AST: 19 U/L (ref 15–41)
Albumin: 3 g/dL — ABNORMAL LOW (ref 3.5–5.0)
Alkaline Phosphatase: 183 U/L — ABNORMAL HIGH (ref 38–126)
Anion gap: 10 (ref 5–15)
BUN: 11 mg/dL (ref 6–20)
CO2: 27 mmol/L (ref 22–32)
Calcium: 8.8 mg/dL — ABNORMAL LOW (ref 8.9–10.3)
Chloride: 104 mmol/L (ref 98–111)
Creatinine, Ser: 0.79 mg/dL (ref 0.44–1.00)
GFR, Estimated: >60 mL/min (ref >60)
Glucose, Bld: 76 mg/dL (ref 70–99)
Potassium: 3.1 mmol/L — ABNORMAL LOW (ref 3.5–5.1)
Sodium: 141 mmol/L (ref 135–145)
Total Bilirubin: 0.4 mg/dL (ref 0.0–1.2)
Total Protein: 5.9 g/dL — ABNORMAL LOW (ref 6.5–8.1)

## 2023-07-08 LAB — CBC
HCT: 39.7 % (ref 36.0–46.0)
Hemoglobin: 13.7 g/dL (ref 12.0–15.0)
MCH: 32.8 pg (ref 26.0–34.0)
MCHC: 34.5 g/dL (ref 30.0–36.0)
MCV: 95 fL (ref 80.0–100.0)
Platelets: 127 K/uL — ABNORMAL LOW (ref 150–400)
RBC: 4.18 MIL/uL (ref 3.87–5.11)
RDW: 12.8 % (ref 11.5–15.5)
WBC: 7.2 K/uL (ref 4.0–10.5)
nRBC: 0 % (ref 0.0–0.2)

## 2023-07-08 LAB — CK: Total CK: 68 U/L (ref 38–234)

## 2023-07-08 LAB — PHOSPHORUS: Phosphorus: 3 mg/dL (ref 2.5–4.6)

## 2023-07-08 LAB — MAGNESIUM: Magnesium: 1.6 mg/dL — ABNORMAL LOW (ref 1.7–2.4)

## 2023-07-08 LAB — PREALBUMIN: Prealbumin: 15 mg/dL — ABNORMAL LOW (ref 18–38)

## 2023-07-08 LAB — PROTIME-INR
INR: 1 (ref 0.8–1.2)
Prothrombin Time: 13.7 s (ref 11.4–15.2)

## 2023-07-08 MED ORDER — URSODIOL 300 MG PO CAPS
600.0000 mg | ORAL_CAPSULE | Freq: Every morning | ORAL | Status: DC
  Administered 2023-07-08 – 2023-07-10 (×3): 600 mg via ORAL
  Filled 2023-07-08 (×3): qty 2

## 2023-07-08 MED ORDER — VITAMIN B-12 100 MCG PO TABS
100.0000 ug | ORAL_TABLET | Freq: Every day | ORAL | Status: DC
  Administered 2023-07-08 – 2023-07-10 (×3): 100 ug via ORAL
  Filled 2023-07-08 (×3): qty 1

## 2023-07-08 MED ORDER — VITAMIN D 25 MCG (1000 UNIT) PO TABS
2000.0000 [IU] | ORAL_TABLET | Freq: Every day | ORAL | Status: DC
  Administered 2023-07-08 – 2023-07-10 (×3): 2000 [IU] via ORAL
  Filled 2023-07-08 (×3): qty 2

## 2023-07-08 MED ORDER — CYCLOBENZAPRINE HCL 5 MG PO TABS: 10.0000 mg | ORAL_TABLET | Freq: Three times a day (TID) | ORAL | Status: DC | PRN

## 2023-07-08 MED ORDER — SODIUM CHLORIDE 0.9 % IV SOLN: INTRAVENOUS | Status: AC

## 2023-07-08 MED ORDER — GADOBUTROL 1 MMOL/ML IV SOLN
4.5000 mL | Freq: Once | INTRAVENOUS | Status: AC | PRN
  Administered 2023-07-08: 4.5 mL via INTRAVENOUS

## 2023-07-08 MED ORDER — POTASSIUM CHLORIDE CRYS ER 20 MEQ PO TBCR
40.0000 meq | EXTENDED_RELEASE_TABLET | Freq: Once | ORAL | Status: AC
  Administered 2023-07-08: 40 meq via ORAL
  Filled 2023-07-08: qty 2

## 2023-07-08 MED ORDER — MAGNESIUM SULFATE 50 % IJ SOLN
4.0000 g | Freq: Once | INTRAVENOUS | Status: AC
  Administered 2023-07-08: 4 g via INTRAVENOUS
  Filled 2023-07-08: qty 8

## 2023-07-08 MED ORDER — PREGABALIN 25 MG PO CAPS
25.0000 mg | ORAL_CAPSULE | Freq: Three times a day (TID) | ORAL | Status: DC
  Administered 2023-07-08: 25 mg via ORAL
  Filled 2023-07-08: qty 1

## 2023-07-08 MED ORDER — LORAZEPAM 2 MG/ML IJ SOLN
1.0000 mg | Freq: Once | INTRAMUSCULAR | Status: AC
  Administered 2023-07-08: 1 mg via INTRAVENOUS
  Filled 2023-07-08: qty 1

## 2023-07-08 NOTE — ED Notes (Signed)
 5C given courtesy call.

## 2023-07-08 NOTE — Progress Notes (Signed)
 Informed by RN that patient is requesting Lyrica  to be discontinued as she no longer takes this medication at home.  Lyrica  has been discontinued.

## 2023-07-08 NOTE — Progress Notes (Signed)
 [facility] Liaison Note  This is current Home Based Primary Care Patient with [facility].  We will follow for discharge disposition.  Please call with any questions or concerns.  Thank you, Randine Nail, BSN, [facility] [phone]

## 2023-07-08 NOTE — Plan of Care (Signed)
 MRI brain and cervical spine reviewed by my colleague overnight and I have reviewed the report, negative for acute enhancing lesions.  Presenting for worsening weakness in the setting of mild hypokalemia to 3.1 and hypomagnesemia to 1.6  Negative imaging favors pseudo flare rather than flare.  Inpatient neurology will not be actively following but please do not hesitate to reach out if questions or concerns arise  Lola Jernigan MD-PhD [facility] [phone] Available 7 AM to 7 PM, outside these hours please contact Neurologist on call listed on AMION   Discussed with Dr. Davia via secure chat

## 2023-07-08 NOTE — Care Management Obs Status (Signed)
 MEDICARE OBSERVATION STATUS NOTIFICATION   Patient Details  Name: Gabriela Campbell MRN: [id number] Date of Birth: [birth year]   Medicare Observation Status Notification Given:     Letter sign and copy given  Claretta Deed 07/08/2023, 12:27 PM

## 2023-07-08 NOTE — Progress Notes (Signed)
 [facility]                                                                              Gabriela Campbell, is a 58 y.o. female, DOB - March 24, 1965, FMW:[id number] Admit date - 07/07/2023    Outpatient Primary [facility] the [facility] is [facility]  LOS - 0  days  Chief Complaint  Patient presents with   Weakness       Brief summary   Patient is 58 y.o. female with HTN,'s, neuropathy, history of shingles with medical history significant of hypertension multiple sclerosis, polycythemia, chronic pain, hemochromatosis, nicotine  use presented from home with lower extremity weakness.  Patient reported weakness and pain in the legs for the past 2 days but worse on the day of admission with nausea, decreased appetite.  No vomiting or diarrhea. Neurology was consulted and recommended MRI.    Assessment & Plan    Principal Problem: Worsening of bilateral lower extremity weakness - Neurology was consulted, recommended MRI brain, C-spine, T-spine.  MRI brain, C-spine showed unchanged distortion of chronic cerebral demyelinating lesions, no new lesions.  Unchanged appearance of cervical spinal cord lesions at C4 and C5, no new lesions. - Per neurology, Dr. Jerrie, neurology likely pseudo exacerbation due to electrolyte abnormalities.  - Follow MRI T-spine, PT OT - During previous admission in 05/2023, patient was placed on Flexeril  10 mg 3 times daily as needed and Lyrica  25 mg 3 times daily, will resume  Active Problems: History of relapsing remitting multiple sclerosis (HCC) -Patient has not been on disease modifying therapy, needs to follow outpatient with [facility] neurology.  Per neurology, currently no active flare.  Hypokalemia, hypomagnesemia - K3.1, replaced p.o. -Magnesium  1.6, replaced IV    Polycythemia -H&H stable    Tobacco use -Nicotine  patch     Essential hypertension  Continue Norvasc     Hepatic fibrosis, advanced fibrosis - Alk phos  improving, outpatient follow-up with PCP  Underweight Estimated body mass index is 19.49 kg/m as calculated from the following:   Height as of this encounter: 5' 1 (1.549 m).   Weight as of this encounter: 46.8 kg.  Code Status: Full CODE STATUS DVT Prophylaxis:  SCDs Start: 07/07/23 2201   Level of Care: Level of care: Med-Surg Family Communication: Updated patient Disposition Plan:      Remains inpatient appropriate:      Procedures:    Consultants:   Neurology  Antimicrobials:   Anti-infectives (From admission, onward)    None          Medications  amLODipine   5 mg Oral q AM   cholecalciferol   2,000 Units Oral Daily   docusate sodium   100 mg Oral BID   senna  1 tablet Oral BID   ursodiol   600 mg Oral q AM   cyanocobalamin   100 mcg Oral Daily      Subjective:   Gabriela Campbell was seen and examined today.  Complaining of pain and weakness in the lower extremities.  Otherwise no acute nausea vomiting, fever chills, chest pain or shortness of breath.  Sitting up in the recliner.  Objective:   Vitals:  07/08/23 0100 07/08/23 0121 07/08/23 0354 07/08/23 0722  BP:  (!) 149/77 133/75 127/85  Pulse:  65 73 (!) 59  Resp:  16 16 18   Temp:  98.5 F (36.9 C) 98.4 F (36.9 C) 98.1 F (36.7 C)  TempSrc:  Oral Oral Oral  SpO2:  100% 100% 99%  Weight: 46.8 kg     Height: 5' 1 (1.549 m)       Intake/Output Summary (Last 24 hours) at 07/08/2023 1010 Last data filed at 07/08/2023 0800 Gross per 24 hour  Intake 476 ml  Output --  Net 476 ml     Wt Readings from Last 3 Encounters:  07/08/23 46.8 kg  06/06/23 40.8 kg  06/01/23 40.8 kg     Exam General: Alert and oriented x 3, NAD Cardiovascular: S1 S2 auscultated,  RRR Respiratory: Clear to auscultation bilaterally Gastrointestinal: Soft, nontender, nondistended, + bowel sounds Ext: no pedal edema bilaterally Neuro: no new focal neurological deficits Psych: Normal affect     Data Reviewed:  I  have personally reviewed following labs    CBC Lab Results  Component Value Date   WBC 7.2 07/08/2023   RBC 4.18 07/08/2023   HGB 13.7 07/08/2023   HCT 39.7 07/08/2023   MCV 95.0 07/08/2023   MCH 32.8 07/08/2023   PLT 127 (L) 07/08/2023   MCHC 34.5 07/08/2023   RDW 12.8 07/08/2023   LYMPHSABS 2.1 07/07/2023   MONOABS 0.3 07/07/2023   EOSABS 0.0 07/07/2023   BASOSABS 0.0 07/07/2023     Last metabolic panel Lab Results  Component Value Date   NA 141 07/08/2023   K 3.1 (L) 07/08/2023   CL 104 07/08/2023   CO2 27 07/08/2023   BUN 11 07/08/2023   CREATININE 0.79 07/08/2023   GLUCOSE 76 07/08/2023   GFRNONAA >60 07/08/2023   GFRAA 85 11/24/2019   CALCIUM 8.8 (L) 07/08/2023   PHOS 3.0 07/08/2023   PROT 5.9 (L) 07/08/2023   ALBUMIN 3.0 (L) 07/08/2023   LABGLOB 2.2 01/22/2022   AGRATIO 1.9 01/22/2022   BILITOT 0.4 07/08/2023   ALKPHOS 183 (H) 07/08/2023   AST 19 07/08/2023   ALT 26 07/08/2023   ANIONGAP 10 07/08/2023    CBG (last 3)  No results for input(s): GLUCAP in the last 72 hours.    Coagulation Profile: Recent Labs  Lab 07/08/23 0603  INR 1.0     Radiology Studies: I have personally reviewed the imaging studies  MR BRAIN WO CONTRAST Result Date: 07/08/2023 CLINICAL DATA:  Multiple sclerosis EXAM: MRI HEAD WITHOUT CONTRAST MRI CERVICAL SPINE WITHOUT CONTRAST TECHNIQUE: Multiplanar, multiecho pulse sequences of the brain and surrounding structures, and cervical spine, to include the craniocervical junction and cervicothoracic junction, were obtained without intravenous contrast. COMPARISON:  05/04/2023 FINDINGS: MRI HEAD FINDINGS Brain: No acute infarct, mass effect or extra-axial collection. No acute or chronic hemorrhage. Unchanged distribution of supratentorial periventricular and deep white matter lesions. No new lesions. The midline structures are normal. Vascular: Normal flow voids. Skull and upper cervical spine: Normal calvarium and skull base.  Visualized upper cervical spine and soft tissues are normal. Sinuses/Orbits:No paranasal sinus fluid levels or advanced mucosal thickening. Left mastoid effusion. Normal orbits. MRI CERVICAL SPINE FINDINGS Alignment: Physiologic. Vertebrae: No fracture, evidence of discitis, or bone lesion. Cord: Imaging of the spinal cord is markedly motion degraded. Unchanged appearance of lesions at C4 and C5. Posterior Fossa, vertebral arteries, paraspinal tissues: Negative. Disc levels: Minimal degenerative changes, as previously demonstrated. No spinal canal or  neural foraminal stenosis. IMPRESSION: 1. Unchanged distribution of chronic cerebral demyelinating lesions. No new lesions. 2. Unchanged appearance of cervical spinal cord lesions at C4 and C5. No new lesions. Electronically Signed   By: Franky Stanford M.D.   On: 07/08/2023 00:05   MR CERVICAL SPINE WO CONTRAST Result Date: 07/08/2023 CLINICAL DATA:  Multiple sclerosis EXAM: MRI HEAD WITHOUT CONTRAST MRI CERVICAL SPINE WITHOUT CONTRAST TECHNIQUE: Multiplanar, multiecho pulse sequences of the brain and surrounding structures, and cervical spine, to include the craniocervical junction and cervicothoracic junction, were obtained without intravenous contrast. COMPARISON:  05/04/2023 FINDINGS: MRI HEAD FINDINGS Brain: No acute infarct, mass effect or extra-axial collection. No acute or chronic hemorrhage. Unchanged distribution of supratentorial periventricular and deep white matter lesions. No new lesions. The midline structures are normal. Vascular: Normal flow voids. Skull and upper cervical spine: Normal calvarium and skull base. Visualized upper cervical spine and soft tissues are normal. Sinuses/Orbits:No paranasal sinus fluid levels or advanced mucosal thickening. Left mastoid effusion. Normal orbits. MRI CERVICAL SPINE FINDINGS Alignment: Physiologic. Vertebrae: No fracture, evidence of discitis, or bone lesion. Cord: Imaging of the spinal cord is markedly motion  degraded. Unchanged appearance of lesions at C4 and C5. Posterior Fossa, vertebral arteries, paraspinal tissues: Negative. Disc levels: Minimal degenerative changes, as previously demonstrated. No spinal canal or neural foraminal stenosis. IMPRESSION: 1. Unchanged distribution of chronic cerebral demyelinating lesions. No new lesions. 2. Unchanged appearance of cervical spinal cord lesions at C4 and C5. No new lesions. Electronically Signed   By: Franky Stanford M.D.   On: 07/08/2023 00:05       Adelayde Minney M.D. [facility] 07/08/2023, 10:10 AM  Available via Epic secure chat 7am-7pm After 7 pm, please refer to night coverage provider listed on amion.

## 2023-07-08 NOTE — Evaluation (Signed)
 Physical Therapy Evaluation Patient Details Name: Gabriela Campbell MRN: [id number] DOB: 02/23/1965 Today's Date: 07/08/2023  History of Present Illness  Pt is a 58 yr old female who presented due to weakness in BLE. Pending MRI results.  PMH: MS, HTN, shingles, hemoglobin cytosis, polycythemia history of tobacco abuse  Clinical Impression  Pt admitted with bilat LE weakness and pain. PTA pt used a RW, required assist up/down stairs but was able to complete own bathing and dressing. Pt reports frequent falls at home. Pt ambulation limited by bilat LE pain and weakness. Pt with bilat knee buckling and in ability to achieve full upright posture requiring minA to safely ambulate. Pt to benefit from aggressive inpatient rehab program > 3 hrs a day to address above deficits and decrease fall risk for safe transition home with father.  Acute PT to cont to follow.      If plan is discharge home, recommend the following: A little help with walking and/or transfers;A little help with bathing/dressing/bathroom;Assist for transportation;Help with stairs or ramp for entrance   Can travel by private vehicle        Equipment Recommendations None recommended by PT  Recommendations for Other Services  Rehab consult    Functional Status Assessment Patient has had a recent decline in their functional status and demonstrates the ability to make significant improvements in function in a reasonable and predictable amount of time.     Precautions / Restrictions Precautions Precautions: Fall Recall of Precautions/Restrictions: Intact Precaution/Restrictions Comments: pt reported 2-3 falls Restrictions Weight Bearing Restrictions Per Provider Order: No      Mobility  Bed Mobility Overal bed mobility: Modified Independent             General bed mobility comments: HOB elevated    Transfers Overall transfer level: Needs assistance Equipment used: Rolling walker (2 wheels) Transfers: Sit  to/from Stand Sit to Stand: Contact guard assist           General transfer comment: contact guard to steady due to bilat knee flexion and heavy dependence on bilat UEs, unable to stand completely upright    Ambulation/Gait Ambulation/Gait assistance: Min assist Gait Distance (Feet): 15 Feet Assistive device: Rolling walker (2 wheels) Gait Pattern/deviations: Step-to pattern, Decreased stride length, Trunk flexed, Narrow base of support, Knees buckling Gait velocity: dec Gait velocity interpretation: <1.8 ft/sec, indicate of risk for recurrent falls   General Gait Details: pt unable to stand fully upright, pt with bilat knee instability and buckling requiring minA to prevent falling. minA for walker management during turns  Stairs            Wheelchair Mobility     Tilt Bed    Modified Rankin (Stroke Patients Only)       Balance Overall balance assessment: Needs assistance Sitting-balance support: Feet supported Sitting balance-Leahy Scale: Good     Standing balance support: Bilateral upper extremity supported Standing balance-Leahy Scale: Poor Standing balance comment: currently dependent on bilat UEs                             Pertinent Vitals/Pain Pain Assessment Pain Assessment: 0-10 Pain Score: 8  Pain Location: bilat LEs    Home Living Family/patient expects to be discharged to:: Private residence Living Arrangements: Parent Available Help at Discharge: Family Type of Home: House Home Access: Stairs to enter Entrance Stairs-Rails: None Entrance Stairs-Number of Steps: 4   Home Layout: One level Home Equipment: Shower  seat;Rollator (4 wheels);Cane - single point;Grab bars - tub/shower Additional Comments: father lives with her    Prior Function Prior Level of Function : Independent/Modified Independent;History of Falls (last six months)             Mobility Comments: Pt reports using 4WW in the home and does not go out of the  home often. ADLs Comments: Pt was indep in ADLS     Extremity/Trunk Assessment   Upper Extremity Assessment Upper Extremity Assessment: Defer to OT evaluation    Lower Extremity Assessment Lower Extremity Assessment: Generalized weakness (pt with noted bilat kne buckling during mobility, pt able to move LEs in bed)    Cervical / Trunk Assessment Cervical / Trunk Assessment: Kyphotic  Communication   Communication Communication: No apparent difficulties    Cognition Arousal: Alert Behavior During Therapy: WFL for tasks assessed/performed                           PT - Cognition Comments: pt aware that falling frequenctly isn't good Following commands: Intact       Cueing Cueing Techniques: Verbal cues     General Comments General comments (skin integrity, edema, etc.): VSS    Exercises     Assessment/Plan    PT Assessment Patient needs continued PT services  PT Problem List Decreased strength;Decreased range of motion;Decreased activity tolerance;Decreased balance;Decreased mobility;Decreased coordination;Pain       PT Treatment Interventions DME instruction;Gait training;Stair training;Functional mobility training;Therapeutic activities;Therapeutic exercise;Balance training;Neuromuscular re-education    PT Goals (Current goals can be found in the Care Plan section)  Acute Rehab PT Goals Patient Stated Goal: stop leg pain PT Goal Formulation: With patient Time For Goal Achievement: 07/22/23 Potential to Achieve Goals: Good    Frequency Min 3X/week     Co-evaluation               AM-PAC PT 6 Clicks Mobility  Outcome Measure Help needed turning from your back to your side while in a flat bed without using bedrails?: None Help needed moving from lying on your back to sitting on the side of a flat bed without using bedrails?: A Little Help needed moving to and from a bed to a chair (including a wheelchair)?: A Little Help needed standing  up from a chair using your arms (e.g., wheelchair or bedside chair)?: A Little Help needed to walk in hospital room?: A Lot Help needed climbing 3-5 steps with a railing? : Total 6 Click Score: 16    End of Session Equipment Utilized During Treatment: Gait belt Activity Tolerance: Patient limited by pain Patient left: in bed;with call bell/phone within reach;with bed alarm set Nurse Communication: Mobility status PT Visit Diagnosis: Unsteadiness on feet (R26.81);Repeated falls (R29.6);Muscle weakness (generalized) (M62.81);History of falling (Z91.81)    Time: 8690-8670 PT Time Calculation (min) (ACUTE ONLY): 20 min   Charges:   PT Evaluation $PT Eval Moderate Complexity: 1 Mod   PT General Charges $$ ACUTE PT VISIT: 1 Visit         Norene Ames, PT, DPT Acute Rehabilitation Services Secure chat preferred Office #: [phone]   Norene CHRISTELLA Ames 07/08/2023, 2:42 PM

## 2023-07-08 NOTE — Progress Notes (Signed)
 RN could not do the pulseoxymetry while ambulating as patient is not steady to walk. Made the provider aware.

## 2023-07-08 NOTE — Evaluation (Signed)
 Occupational Therapy Evaluation Patient Details Name: Gabriela Campbell MRN: [id number] DOB: Sep 10, 1965 Today's Date: 07/08/2023   History of Present Illness   Pt is a 58 yr old female who presented due to weakness in BLE. Pending MRI results.  PMH: MS, HTN, shingles, hemoglobin cytosis, polycythemia history of tobacco abuse     Clinical Impressions Pt reports at PLOF they use 4WW for ambulation and only go out of the home if going to md visit with friends assisting. She has 4 steps to enter/exit the home with no rail and her father lives with her. She has items delivered to home, completes IADLs as able to and father will assist and indep in ADLS. At this time limited evaluation as meal tray came into room when with pt but agreeable to go to chair with RW and CGA. Acute Occupational Therapy to follow.      If plan is discharge home, recommend the following:   A little help with walking and/or transfers;Assistance with cooking/housework;Assist for transportation;Help with stairs or ramp for entrance     Functional Status Assessment   Patient has had a recent decline in their functional status and demonstrates the ability to make significant improvements in function in a reasonable and predictable amount of time.     Equipment Recommendations   BSC/3in1 [phone], she reported trying to get a new one for the last yr.)     Recommendations for Other Services         Precautions/Restrictions   Precautions Precautions: Fall Recall of Precautions/Restrictions: Intact Precaution/Restrictions Comments: pt reported 2-3 falls Restrictions Weight Bearing Restrictions Per Provider Order: No     Mobility Bed Mobility Overal bed mobility: Modified Independent             General bed mobility comments: HOB elevated    Transfers Overall transfer level: Needs assistance Equipment used: Rolling walker (2 wheels) Transfers: Sit to/from Stand Sit to Stand: Contact guard  assist                  Balance Overall balance assessment: Needs assistance Sitting-balance support: Feet supported Sitting balance-Leahy Scale: Good     Standing balance support: Bilateral upper extremity supported Standing balance-Leahy Scale: Fair Standing balance comment: able to reach out to chair with no LOB but does report how BLE give out                           ADL either performed or assessed with clinical judgement   ADL Overall ADL's : Needs assistance/impaired Eating/Feeding: Independent;Sitting   Grooming: Wash/dry face;Sitting   Upper Body Bathing: Set up;Sitting       Upper Body Dressing : Set up;Sitting       Toilet Transfer: Contact guard assist;Cueing for safety;Cueing for sequencing;Rolling walker (2 wheels)           Functional mobility during ADLs: Contact guard assist;Rolling walker (2 wheels);Cueing for sequencing;Cueing for safety       Vision Baseline Vision/History: 0 No visual deficits;1 Wears glasses Ability to See in Adequate Light: 0 Adequate Patient Visual Report: No change from baseline Vision Assessment?: Wears glasses for reading;Wears glasses for driving     Perception Perception: Within Functional Limits       Praxis Praxis: WFL       Pertinent Vitals/Pain Pain Assessment Pain Assessment: 0-10 Pain Score: 9  Pain Location: general     Extremity/Trunk Assessment Upper Extremity Assessment Upper Extremity Assessment: Generalized  weakness;Right hand dominant   Lower Extremity Assessment Lower Extremity Assessment: Defer to PT evaluation   Cervical / Trunk Assessment Cervical / Trunk Assessment: Kyphotic   Communication Communication Communication: No apparent difficulties   Cognition Arousal: Alert Behavior During Therapy: WFL for tasks assessed/performed Cognition: No apparent impairments                               Following commands: Intact       Cueing  General  Comments   Cueing Techniques: Verbal cues      Exercises     Shoulder Instructions      Home Living Family/patient expects to be discharged to:: Private residence Living Arrangements: Parent Available Help at Discharge: Family Type of Home: House Home Access: Stairs to enter Secretary/administrator of Steps: 4 Entrance Stairs-Rails: None Home Layout: One level     Bathroom Shower/Tub: Producer, television/film/video: Standard Bathroom Accessibility: Yes How Accessible: Accessible via walker Home Equipment: Educational psychologist (4 wheels);Cane - single point;Grab bars - tub/shower   Additional Comments: father lives with her      Prior Functioning/Environment Prior Level of Function : Independent/Modified Independent;History of Falls (last six months)             Mobility Comments: Pt reports using 4WW in the home and does not go out of the home often. ADLs Comments: Pt was indep in ADLS    OT Problem List: Decreased activity tolerance;Impaired balance (sitting and/or standing);Decreased strength;Pain   OT Treatment/Interventions: Self-care/ADL training;Therapeutic exercise;Therapeutic activities;Balance training;Patient/family education      OT Goals(Current goals can be found in the care plan section)   Acute Rehab OT Goals Patient Stated Goal: to eat OT Goal Formulation: With patient Time For Goal Achievement: 07/22/23 Potential to Achieve Goals: Good   OT Frequency:  Min 2X/week    Co-evaluation              AM-PAC OT 6 Clicks Daily Activity     Outcome Measure Help from another person eating meals?: None Help from another person taking care of personal grooming?: None Help from another person toileting, which includes using toliet, bedpan, or urinal?: A Little Help from another person bathing (including washing, rinsing, drying)?: A Little Help from another person to put on and taking off regular upper body clothing?: None Help from  another person to put on and taking off regular lower body clothing?: A Little 6 Click Score: 21   End of Session Equipment Utilized During Treatment: Rolling walker (2 wheels) (decline gait belt) Nurse Communication: Mobility status  Activity Tolerance: No increased pain;Other (comment) (wanted to eat breakfast) Patient left: in chair;with call bell/phone within reach;Other (comment) (chair alarm was not placed but nurse made aware as pt eager to go to chair)  OT Visit Diagnosis: Unsteadiness on feet (R26.81);Other abnormalities of gait and mobility (R26.89);Muscle weakness (generalized) (M62.81);Pain Pain - Right/Left:  (general)                Time: 9268-9249 OT Time Calculation (min): 19 min Charges:  OT General Charges $OT Visit: 1 Visit OT Evaluation $OT Eval Low Complexity: 1 Low  Warrick POUR OTR/L  Acute Rehab Services  [phone] office number   Warrick Berber 07/08/2023, 7:59 AM

## 2023-07-08 NOTE — Plan of Care (Signed)

## 2023-07-08 NOTE — TOC CM/SW Note (Signed)
 Transition of Care [facility]) - Inpatient Brief Assessment   Patient Details  Name: Gabriela Campbell MRN: [id number] Date of Birth: Jan 20, 1965  Transition of [facility]) CM/SW Contact:    Lauraine FORBES Saa, LCSW Phone Number: 07/08/2023, 9:38 AM   Clinical Narrative:  9:38 AM Per chart review, patient resides at home with parent(s). Patient has a PCP Photographer) and insurance. Patient does not have SNF history. Patient has HH history with [facility] and [facility]. Patient has DME (wheelchair, rollator, BSC, walker, shower seat) history. Patient's preferred pharmacy is [facility] [phone] [facility]. No TOC needs were identified at this time. TOC will continue to follow and be available to assist.  Transition of Care Asessment: Insurance and Status: Insurance coverage has been reviewed Patient has primary care physician: Yes Home environment has been reviewed: Private Residence Prior level of function:: N/A Prior/Current Home Services: No current home services (Has HH/DME history) Social Drivers of Health Review: SDOH reviewed no interventions necessary Readmission risk has been reviewed: Yes Transition of care needs: no transition of care needs at this time

## 2023-07-08 NOTE — Progress Notes (Signed)
Inpatient Rehab Admissions Coordinator:   Per therapy recommendations, patient was screened for CIR candidacy by Bethann Qualley, MS, CCC-SLP. At this time, Pt. does not appear to demonstrate medical necessity to justify in hospital rehabilitation/CIR. will not pursue a rehab consult for this Pt.   Recommend other rehab venues to be pursued.  Please contact me with any questions.  Kerrilyn Azbill, MS, CCC-SLP Rehab Admissions Coordinator  [phone] (celll) [phone] (office)   

## 2023-07-08 NOTE — Progress Notes (Signed)
 SCD placed. Patietient connected to the pulseoxymetry. Incentive spirometry introduced with teach back.

## 2023-07-09 DIAGNOSIS — D751 Secondary polycythemia: Secondary | ICD-10-CM | POA: Diagnosis not present

## 2023-07-09 DIAGNOSIS — R29898 Other symptoms and signs involving the musculoskeletal system: Secondary | ICD-10-CM | POA: Diagnosis not present

## 2023-07-09 DIAGNOSIS — Z720 Tobacco use: Secondary | ICD-10-CM | POA: Diagnosis not present

## 2023-07-09 DIAGNOSIS — G35 Multiple sclerosis: Secondary | ICD-10-CM | POA: Diagnosis not present

## 2023-07-09 LAB — RENAL FUNCTION PANEL
Albumin: 2.7 g/dL — ABNORMAL LOW (ref 3.5–5.0)
Anion gap: 11 (ref 5–15)
BUN: 9 mg/dL (ref 6–20)
CO2: 25 mmol/L (ref 22–32)
Calcium: 8.4 mg/dL — ABNORMAL LOW (ref 8.9–10.3)
Chloride: 108 mmol/L (ref 98–111)
Creatinine, Ser: 0.69 mg/dL (ref 0.44–1.00)
GFR, Estimated: >60 mL/min (ref >60)
Glucose, Bld: 116 mg/dL — ABNORMAL HIGH (ref 70–99)
Phosphorus: 3.4 mg/dL (ref 2.5–4.6)
Potassium: 3.3 mmol/L — ABNORMAL LOW (ref 3.5–5.1)
Sodium: 144 mmol/L (ref 135–145)

## 2023-07-09 LAB — CBC
HCT: 37.9 % (ref 36.0–46.0)
Hemoglobin: 12.7 g/dL (ref 12.0–15.0)
MCH: 32.4 pg (ref 26.0–34.0)
MCHC: 33.5 g/dL (ref 30.0–36.0)
MCV: 96.7 fL (ref 80.0–100.0)
Platelets: 117 K/uL — ABNORMAL LOW (ref 150–400)
RBC: 3.92 MIL/uL (ref 3.87–5.11)
RDW: 13.2 % (ref 11.5–15.5)
WBC: 6.8 K/uL (ref 4.0–10.5)
nRBC: 0 % (ref 0.0–0.2)

## 2023-07-09 LAB — MAGNESIUM: Magnesium: 1.9 mg/dL (ref 1.7–2.4)

## 2023-07-09 MED ORDER — POTASSIUM CHLORIDE CRYS ER 20 MEQ PO TBCR
40.0000 meq | EXTENDED_RELEASE_TABLET | Freq: Once | ORAL | Status: AC
  Administered 2023-07-09: 40 meq via ORAL
  Filled 2023-07-09: qty 2

## 2023-07-09 MED ORDER — OXYCODONE HCL 5 MG PO TABS
5.0000 mg | ORAL_TABLET | Freq: Four times a day (QID) | ORAL | Status: DC | PRN
  Administered 2023-07-09 – 2023-07-10 (×4): 5 mg via ORAL
  Filled 2023-07-09 (×3): qty 1

## 2023-07-09 MED ORDER — FENTANYL CITRATE PF 50 MCG/ML IJ SOSY
25.0000 ug | PREFILLED_SYRINGE | INTRAMUSCULAR | Status: DC | PRN
  Administered 2023-07-09 – 2023-07-10 (×5): 25 ug via INTRAVENOUS
  Filled 2023-07-09 (×6): qty 1

## 2023-07-09 NOTE — Progress Notes (Signed)
 [facility]                                                                              Gabriela Campbell, is a 58 y.o. female, DOB - 1965-11-17, FMW:[id number] Admit date - 07/07/2023    Outpatient Primary MD for the patient is [facility]  LOS - 0  days  Chief Complaint  Patient presents with   Weakness       Brief summary   Patient is 58 y.o. female with HTN,'s, neuropathy, history of shingles with medical history significant of hypertension multiple sclerosis, polycythemia, chronic pain, hemochromatosis, nicotine  use presented from home with lower extremity weakness.  Patient reported weakness and pain in the legs for the past 2 days but worse on the day of admission with nausea, decreased appetite.  No vomiting or diarrhea. Neurology was consulted and recommended MRI brain, C-spine, T-spine.    Assessment & Plan     Worsening of bilateral lower extremity weakness - Neurology was consulted, recommended MRI brain, C-spine, T-spine.  MRI brain, C-spine showed unchanged distortion of chronic cerebral demyelinating lesions, no new lesions.  Unchanged appearance of cervical spinal cord lesions at C4 and C5, no new lesions. - MRI T-spine with stable lesions in the cord at T5, T6-7 and T8-9 c/w demyelinating disease - Per neurology, Dr. Jerrie, neurology likely pseudo exacerbation due to electrolyte abnormalities.  - During previous admission in 05/2023, patient was placed on Flexeril  10 mg 3 times daily as needed and Lyrica  25 mg 3 times daily, resumed - PT OT evaluation recommended CIR, consult placed  Acute pain in lower extremities - Patient has been complaining of acute pain in her lower extremities - As per neurology, there is no acute flare of MS, will rule out DVT.  Has history of neuropathy and chronic pain, but reports pain is worse.  If no significant improvement in weakness, pain, can consider MRI of the L-spine Continue oxycodone   History of  relapsing remitting multiple sclerosis (HCC) -Patient has not been on disease modifying therapy, needs to follow outpatient with [facility] neurology.  Per neurology, currently no active flare.  Hypokalemia,  K3.3, replaced p.o.  hypomagnesemia - Magnesium  now improved    Polycythemia -H&H stable    Tobacco use -Nicotine  patch     Essential hypertension  Continue Norvasc     Hepatic fibrosis, advanced fibrosis - Alk phos improving, outpatient follow-up with PCP  Underweight Estimated body mass index is 19.49 kg/m as calculated from the following:   Height as of this encounter: 5' 1 (1.549 m).   Weight as of this encounter: 46.8 kg.  Code Status: Full CODE STATUS DVT Prophylaxis:  SCDs Start: 07/07/23 2201   Level of Care: Level of care: Med-Surg Family Communication: Updated patient Disposition Plan:      Remains inpatient appropriate:      Procedures:    Consultants:   Neurology  Antimicrobials:   Anti-infectives (From admission, onward)    None          Medications  amLODipine   5 mg Oral q AM   cholecalciferol   2,000 Units Oral Daily   docusate sodium   100 mg Oral BID   potassium chloride   40 mEq Oral Once   senna  1 tablet Oral BID   ursodiol   600 mg Oral q AM   cyanocobalamin   100 mcg Oral Daily      Subjective:   Gabriela Campbell was seen and examined today.  Continues to complain of pain and weakness in the lower extremities,.  No fever chills, chest pain, shortness of breath, nausea or vomiting. Objective:   Vitals:   07/08/23 1602 07/08/23 2014 07/09/23 0525 07/09/23 0731  BP: 114/72 121/73 115/76 132/80  Pulse: 72 71 67 66  Resp: 16 17 17    Temp: 98.1 F (36.7 C) 97.8 F (36.6 C) 97.8 F (36.6 C) 98 F (36.7 C)  TempSrc: Oral Oral Oral   SpO2: 99% 99% 100% 99%  Weight:      Height:        Intake/Output Summary (Last 24 hours) at 07/09/2023 1250 Last data filed at 07/09/2023 0326 Gross per 24 hour  Intake 1294.02 ml  Output  --  Net 1294.02 ml     Wt Readings from Last 3 Encounters:  07/08/23 46.8 kg  06/06/23 40.8 kg  06/01/23 40.8 kg    Physical Exam General: Alert and oriented x 3, NAD Cardiovascular: S1 S2 clear, RRR.  Respiratory: CTAB Gastrointestinal: Soft, nontender, nondistended, NBS Ext: no pedal edema bilaterally Neuro: no new deficits Psych: Normal affect       Data Reviewed:  I have personally reviewed following labs    CBC Lab Results  Component Value Date   WBC 6.8 07/09/2023   RBC 3.92 07/09/2023   HGB 12.7 07/09/2023   HCT 37.9 07/09/2023   MCV 96.7 07/09/2023   MCH 32.4 07/09/2023   PLT 117 (L) 07/09/2023   MCHC 33.5 07/09/2023   RDW 13.2 07/09/2023   LYMPHSABS 2.1 07/07/2023   MONOABS 0.3 07/07/2023   EOSABS 0.0 07/07/2023   BASOSABS 0.0 07/07/2023     Last metabolic panel Lab Results  Component Value Date   NA 144 07/09/2023   K 3.3 (L) 07/09/2023   CL 108 07/09/2023   CO2 25 07/09/2023   BUN 9 07/09/2023   CREATININE 0.69 07/09/2023   GLUCOSE 116 (H) 07/09/2023   GFRNONAA >60 07/09/2023   GFRAA 85 11/24/2019   CALCIUM 8.4 (L) 07/09/2023   PHOS 3.4 07/09/2023   PROT 5.9 (L) 07/08/2023   ALBUMIN 2.7 (L) 07/09/2023   LABGLOB 2.2 01/22/2022   AGRATIO 1.9 01/22/2022   BILITOT 0.4 07/08/2023   ALKPHOS 183 (H) 07/08/2023   AST 19 07/08/2023   ALT 26 07/08/2023   ANIONGAP 11 07/09/2023    CBG (last 3)  No results for input(s): GLUCAP in the last 72 hours.    Coagulation Profile: Recent Labs  Lab 07/08/23 0603  INR 1.0     Radiology Studies: I have personally reviewed the imaging studies  MR THORACIC SPINE W WO CONTRAST Result Date: 07/08/2023 CLINICAL DATA:  Multiple sclerosis (MS) EXAM: MRI THORACIC WITHOUT AND WITH CONTRAST TECHNIQUE: Multiplanar and multiecho pulse sequences of the thoracic spine were obtained without and with intravenous contrast. CONTRAST:  4.5mL GADAVIST  GADOBUTROL  1 MMOL/ML IV SOLN COMPARISON:  MRI of the thoracic  spine dated February 13, 2023. FINDINGS: Alignment:  Exaggerated thoracic kyphosis. Vertebrae: Hemangiomas within the T2, T8 and T10 vertebral bodies. No concerning lesions. No fractures. Cord: T2 hyperintense lesions are again demonstrated at T5, T6-7 and T8-9. There are no new lesions evident. There is no  abnormal enhancement of the spinal cord or nerve roots. Paraspinal and other soft tissues: Streaky infiltrate/atelectasis present dependently within the upper lung zones bilaterally. Disc levels: The disc spaces are satisfactory preserved. There is no focal disc herniation or significant spinal canal or neural foraminal stenosis. IMPRESSION: 1. Stable lesions within the spinal cord at T5, T6-7 and T8-9, compatible with demyelinating disease. Electronically Signed   By: Evalene Coho M.D.   On: 07/08/2023 11:31   MR BRAIN WO CONTRAST Result Date: 07/08/2023 CLINICAL DATA:  Multiple sclerosis EXAM: MRI HEAD WITHOUT CONTRAST MRI CERVICAL SPINE WITHOUT CONTRAST TECHNIQUE: Multiplanar, multiecho pulse sequences of the brain and surrounding structures, and cervical spine, to include the craniocervical junction and cervicothoracic junction, were obtained without intravenous contrast. COMPARISON:  05/04/2023 FINDINGS: MRI HEAD FINDINGS Brain: No acute infarct, mass effect or extra-axial collection. No acute or chronic hemorrhage. Unchanged distribution of supratentorial periventricular and deep white matter lesions. No new lesions. The midline structures are normal. Vascular: Normal flow voids. Skull and upper cervical spine: Normal calvarium and skull base. Visualized upper cervical spine and soft tissues are normal. Sinuses/Orbits:No paranasal sinus fluid levels or advanced mucosal thickening. Left mastoid effusion. Normal orbits. MRI CERVICAL SPINE FINDINGS Alignment: Physiologic. Vertebrae: No fracture, evidence of discitis, or bone lesion. Cord: Imaging of the spinal cord is markedly motion degraded. Unchanged  appearance of lesions at C4 and C5. Posterior Fossa, vertebral arteries, paraspinal tissues: Negative. Disc levels: Minimal degenerative changes, as previously demonstrated. No spinal canal or neural foraminal stenosis. IMPRESSION: 1. Unchanged distribution of chronic cerebral demyelinating lesions. No new lesions. 2. Unchanged appearance of cervical spinal cord lesions at C4 and C5. No new lesions. Electronically Signed   By: Franky Stanford M.D.   On: 07/08/2023 00:05   MR CERVICAL SPINE WO CONTRAST Result Date: 07/08/2023 CLINICAL DATA:  Multiple sclerosis EXAM: MRI HEAD WITHOUT CONTRAST MRI CERVICAL SPINE WITHOUT CONTRAST TECHNIQUE: Multiplanar, multiecho pulse sequences of the brain and surrounding structures, and cervical spine, to include the craniocervical junction and cervicothoracic junction, were obtained without intravenous contrast. COMPARISON:  05/04/2023 FINDINGS: MRI HEAD FINDINGS Brain: No acute infarct, mass effect or extra-axial collection. No acute or chronic hemorrhage. Unchanged distribution of supratentorial periventricular and deep white matter lesions. No new lesions. The midline structures are normal. Vascular: Normal flow voids. Skull and upper cervical spine: Normal calvarium and skull base. Visualized upper cervical spine and soft tissues are normal. Sinuses/Orbits:No paranasal sinus fluid levels or advanced mucosal thickening. Left mastoid effusion. Normal orbits. MRI CERVICAL SPINE FINDINGS Alignment: Physiologic. Vertebrae: No fracture, evidence of discitis, or bone lesion. Cord: Imaging of the spinal cord is markedly motion degraded. Unchanged appearance of lesions at C4 and C5. Posterior Fossa, vertebral arteries, paraspinal tissues: Negative. Disc levels: Minimal degenerative changes, as previously demonstrated. No spinal canal or neural foraminal stenosis. IMPRESSION: 1. Unchanged distribution of chronic cerebral demyelinating lesions. No new lesions. 2. Unchanged appearance of  cervical spinal cord lesions at C4 and C5. No new lesions. Electronically Signed   By: Franky Stanford M.D.   On: 07/08/2023 00:05       Erva Koke M.D. [facility] 07/09/2023, 12:50 PM  Available via Epic secure chat 7am-7pm After 7 pm, please refer to night coverage provider listed on amion.

## 2023-07-09 NOTE — Plan of Care (Signed)

## 2023-07-09 NOTE — Progress Notes (Signed)
 Occupational Therapy Treatment Patient Details Name: Gabriela Campbell MRN: [id number] DOB: 1965/02/12 Today's Date: 07/09/2023   History of present illness Pt is a 58 yr old female who presented due to weakness in BLE. MRI no new lessions. PMH: MS, HTN, shingles, hemoglobin cytosis, polycythemia history of tobacco abuse   OT comments  Pt at this time reproted feeling only a little bit better as still feeling weak in BLE. She was able to ambulate with RW to and from sink and completed ADLs sitting to standing at sink with CGA. She then agreed to sit up in chair for meal. Patient will benefit from continued inpatient follow up therapy, <3 hours/day.      If plan is discharge home, recommend the following:  A little help with walking and/or transfers;Assistance with cooking/housework;Assist for transportation;Help with stairs or ramp for entrance   Equipment Recommendations  BSC/3in1 (4ww)    Recommendations for Other Services      Precautions / Restrictions Precautions Precautions: Fall Recall of Precautions/Restrictions: Intact Precaution/Restrictions Comments: pt reported 2-3 falls Restrictions Weight Bearing Restrictions Per Provider Order: No       Mobility Bed Mobility Overal bed mobility: Modified Independent             General bed mobility comments: HOB elevated    Transfers Overall transfer level: Needs assistance Equipment used: Rolling walker (2 wheels) Transfers: Sit to/from Stand Sit to Stand: Contact guard assist           General transfer comment: contact guard to steady due to bilat knee flexion and heavy dependence on bilat UEs, unable to stand completely upright     Balance Overall balance assessment: Needs assistance Sitting-balance support: Feet supported Sitting balance-Leahy Scale: Good     Standing balance support: Bilateral upper extremity supported, Single extremity supported Standing balance-Leahy Scale: Fair Standing balance  comment: Pt able to complete some unilateral standing for less then 1 min                           ADL either performed or assessed with clinical judgement   ADL Overall ADL's : Needs assistance/impaired Eating/Feeding: Independent;Sitting   Grooming: Wash/dry face;Sitting   Upper Body Bathing: Set up;Sitting   Lower Body Bathing: Contact guard assist;Sit to/from stand   Upper Body Dressing : Set up;Sitting   Lower Body Dressing: Contact guard assist;Sit to/from stand   Toilet Transfer: Contact guard assist;Cueing for safety;Cueing for sequencing   Toileting- Clothing Manipulation and Hygiene: Contact guard assist;Sit to/from stand       Functional mobility during ADLs: Contact guard assist;Rolling walker (2 wheels)      Extremity/Trunk Assessment Upper Extremity Assessment Upper Extremity Assessment: Overall WFL for tasks assessed   Lower Extremity Assessment Lower Extremity Assessment: Defer to PT evaluation        Vision   Vision Assessment?: Wears glasses for reading;Wears glasses for driving   Perception Perception Perception: Within Functional Limits   Praxis Praxis Praxis: WFL   Communication Communication Communication: No apparent difficulties   Cognition Arousal: Alert Behavior During Therapy: WFL for tasks assessed/performed Cognition: No apparent impairments                               Following commands: Intact        Cueing   Cueing Techniques: Verbal cues  Exercises      Shoulder Instructions  General Comments      Pertinent Vitals/ Pain       Pain Assessment Pain Assessment: 0-10 Pain Score: 10-Worst pain ever Pain Location: bilat LEs Pain Descriptors / Indicators: Discomfort Pain Intervention(s): Limited activity within patient's tolerance, Patient requesting pain meds-RN notified  Home Living                                          Prior Functioning/Environment               Frequency  Min 2X/week        Progress Toward Goals  OT Goals(current goals can now be found in the care plan section)  Progress towards OT goals: Progressing toward goals  Acute Rehab OT Goals Patient Stated Goal: to go home OT Goal Formulation: With patient Time For Goal Achievement: 07/22/23 Potential to Achieve Goals: Good ADL Goals Pt Will Perform Lower Body Bathing: with modified independence;sit to/from stand Pt Will Perform Lower Body Dressing: with modified independence;sit to/from stand Pt Will Transfer to Toilet: with modified independence;ambulating Additional ADL Goal #1: Pt will be able to collect ADL items as needed to complete task with no LOB.  Plan      Co-evaluation                 AM-PAC OT 6 Clicks Daily Activity     Outcome Measure   Help from another person eating meals?: None Help from another person taking care of personal grooming?: None Help from another person toileting, which includes using toliet, bedpan, or urinal?: A Little Help from another person bathing (including washing, rinsing, drying)?: A Little Help from another person to put on and taking off regular upper body clothing?: None Help from another person to put on and taking off regular lower body clothing?: A Little 6 Click Score: 21    End of Session Equipment Utilized During Treatment: Gait belt;Rolling walker (2 wheels)  OT Visit Diagnosis: Unsteadiness on feet (R26.81);Other abnormalities of gait and mobility (R26.89);Muscle weakness (generalized) (M62.81);Pain   Activity Tolerance Patient tolerated treatment well   Patient Left in chair;with call bell/phone within reach;with chair alarm set   Nurse Communication Mobility status        Time: 1140-1159 OT Time Calculation (min): 19 min  Charges: OT General Charges $OT Visit: 1 Visit OT Treatments $Self Care/Home Management : 8-22 mins  Warrick POUR OTR/L  Acute Rehab Services  [phone] office  number   Warrick Berber 07/09/2023, 12:10 PM

## 2023-07-09 NOTE — Progress Notes (Signed)
 Inpatient Rehab Admissions Coordinator:   Consult received and chart reviewed.  Note CIR screen yesterday and agree.  Pt admitted with BLE weakness.  Workup for MS exacerbation showed no new lesions and neurology feels pseudoexacerbation of MS related to electrolyte imbalance.  She is admitted for observation.  She lacks the medical necessity to support us  getting approval with [facility].  I will sign off at this time.  She will need to pursue alternative rehab venues.   Reche Lowers, PT, DPT Admissions Coordinator [phone] 07/09/23  10:06 AM

## 2023-07-09 NOTE — TOC Initial Note (Signed)
 Transition of Care [facility]) - Initial/Assessment Note    Patient Details  Name: Gabriela Campbell MRN: [id number] Date of Birth: [birth year]  Transition of Care [facility]) CM/SW Contact:    Lauraine FORBES Saa, LCSW Phone Number: 07/09/2023, 4:20 PM  Clinical Narrative:                  4:20 PM CSW introduced self and role to patient. CSW informed patient that they are ineligible for CIR. CSW informed patient of options (SNF vs HH) and explained differences (SNF residential therapy 5x a week approximately 3 hours each day; HH approximately two visits a week 1-2 hours each day). Patient expressed preference in [facility] vs SNF. Patient stated she resides at home with her father who could provide transportation and home support if needed upon discharge. RNCM made aware of patient's preference.  Expected Discharge Plan: Home w Home Health Services Barriers to Discharge: Continued Medical Work up   Patient Goals and CMS Choice Patient states their goals for this hospitalization and ongoing recovery are:: to return home with [facility]          Expected Discharge Plan and Services   Discharge Planning Services: CM Consult Post Acute Care Choice: Home Health, Resumption of Svcs/PTA Provider Living arrangements for the past 2 months: Single Family Home                           HH Arranged: PT, Social Work [employer] Agency: [facility] Date [facility] Agency Contacted: 07/09/23 Time HH Agency Contacted: 1557 Representative spoke with at [facility] Agency: Darleene  Prior Living Arrangements/Services Living arrangements for the past 2 months: Single Family Home Lives with:: Parents (home w/ father) Patient language and need for interpreter reviewed:: Yes          Care giver support system in place?: Yes (comment) Current home services: DME (walker, shower chair) Criminal Activity/Legal Involvement Pertinent to Current Situation/Hospitalization: No - Comment as needed  Activities of Daily Living   ADL Screening  (condition at time of admission) Independently performs ADLs?: No Does the patient have a NEW difficulty with bathing/dressing/toileting/self-feeding that is expected to last >3 days?: No Does the patient have a NEW difficulty with getting in/out of bed, walking, or climbing stairs that is expected to last >3 days?: Yes (Initiates electronic notice to provider for possible PT consult) Does the patient have a NEW difficulty with communication that is expected to last >3 days?: No Is the patient deaf or have difficulty hearing?: No Does the patient have difficulty seeing, even when wearing glasses/contacts?: No Does the patient have difficulty concentrating, remembering, or making decisions?: No  Permission Sought/Granted Permission sought to share information with : Family Supports, Oceanographer granted to share information with : No  Share Information with NAME: Alfonso Lay  Permission granted to share info w AGENCY: HH  Permission granted to share info w Relationship: Friend  Permission granted to share info w Contact Information: [phone]  Emotional Assessment Appearance:: Appears stated age Attitude/Demeanor/Rapport: Engaged Affect (typically observed): Accepting, Appropriate, Adaptable, Calm, Stable, Pleasant Orientation: : Oriented to Self, Oriented to Place, Oriented to  Time, Oriented to Situation Alcohol / Substance Use: Not Applicable Psych Involvement: No (comment)  Admission diagnosis:  Multiple sclerosis (HCC) [G35] Weakness of both lower extremities [R29.898] Patient Active Problem List   Diagnosis Date Noted   Multiple sclerosis (HCC) 07/07/2023   Generalized weakness 05/04/2023   History of multiple  sclerosis (HCC) 04/08/2023   Tobacco dependence syndrome 04/08/2023   Tobacco use 04/08/2023   Hepatic fibrosis, advanced fibrosis 03/03/2023   Scoliosis deformity of spine 02/27/2023   Bilateral leg weakness 07/28/2022   Chronic low  back pain 07/28/2022   Essential hypertension 07/28/2022   Hypokalemia 07/28/2022   Polycythemia 07/28/2022   left leg pain 06/29/2021   Hereditary hemochromatosis (HCC) 07/29/2020   Elevated liver enzymes 05/31/2020   Vitamin D  deficiency 02/18/2018   Relapsing remitting multiple sclerosis (HCC) 03/28/2017   Gait abnormality 03/14/2017   Smoker 10/10/2016   Leg weakness, bilateral 10/10/2016   PCP:  [facility] Pharmacy:   [id number] [address], [place] - [address] AT [facility] & [address] [place] [address] Phone: [phone] Fax: [phone]     Social Drivers of Health (SDOH) Social History: SDOH Screenings   Food Insecurity: No Food Insecurity (07/08/2023)  Housing: Low Risk  (07/08/2023)  Transportation Needs: No Transportation Needs (07/08/2023)  Utilities: Not At Risk (07/08/2023)  Depression (PHQ2-9): High Risk (10/25/2020)  Social Connections: Socially Isolated (05/04/2023)  Tobacco Use: High Risk (07/07/2023)   SDOH Interventions:     Readmission Risk Interventions    04/11/2023   12:02 PM  Readmission Risk Prevention Plan  Post Dischage Appt Complete  Medication Screening Complete  Transportation Screening Complete

## 2023-07-09 NOTE — Care Management Obs Status (Signed)
 MEDICARE OBSERVATION STATUS NOTIFICATION   Patient Details  Name: Gabriela Campbell MRN: [id number] Date of Birth: 07-02-1965   Medicare Observation Status Notification Given:  Yes  Obs letter signed and copy given   Claretta Deed 07/09/2023, 1:31 PM

## 2023-07-10 ENCOUNTER — Observation Stay (HOSPITAL_BASED_OUTPATIENT_CLINIC_OR_DEPARTMENT_OTHER)

## 2023-07-10 DIAGNOSIS — D751 Secondary polycythemia: Secondary | ICD-10-CM | POA: Diagnosis not present

## 2023-07-10 DIAGNOSIS — R609 Edema, unspecified: Secondary | ICD-10-CM | POA: Diagnosis not present

## 2023-07-10 DIAGNOSIS — R29898 Other symptoms and signs involving the musculoskeletal system: Secondary | ICD-10-CM | POA: Diagnosis not present

## 2023-07-10 DIAGNOSIS — Z720 Tobacco use: Secondary | ICD-10-CM | POA: Diagnosis not present

## 2023-07-10 DIAGNOSIS — G35 Multiple sclerosis: Secondary | ICD-10-CM | POA: Diagnosis not present

## 2023-07-10 LAB — CBC
HCT: 39.2 % (ref 36.0–46.0)
Hemoglobin: 13.3 g/dL (ref 12.0–15.0)
MCH: 33 pg (ref 26.0–34.0)
MCHC: 33.9 g/dL (ref 30.0–36.0)
MCV: 97.3 fL (ref 80.0–100.0)
Platelets: 118 K/uL — ABNORMAL LOW (ref 150–400)
RBC: 4.03 MIL/uL (ref 3.87–5.11)
RDW: 13.1 % (ref 11.5–15.5)
WBC: 8.2 K/uL (ref 4.0–10.5)
nRBC: 0 % (ref 0.0–0.2)

## 2023-07-10 LAB — RENAL FUNCTION PANEL
Albumin: 2.8 g/dL — ABNORMAL LOW (ref 3.5–5.0)
Anion gap: 6 (ref 5–15)
BUN: 10 mg/dL (ref 6–20)
CO2: 27 mmol/L (ref 22–32)
Calcium: 8.3 mg/dL — ABNORMAL LOW (ref 8.9–10.3)
Chloride: 103 mmol/L (ref 98–111)
Creatinine, Ser: 0.71 mg/dL (ref 0.44–1.00)
GFR, Estimated: >60 mL/min (ref >60)
Glucose, Bld: 131 mg/dL — ABNORMAL HIGH (ref 70–99)
Phosphorus: 3.9 mg/dL (ref 2.5–4.6)
Potassium: 3.7 mmol/L (ref 3.5–5.1)
Sodium: 136 mmol/L (ref 135–145)

## 2023-07-10 LAB — MAGNESIUM: Magnesium: 1.7 mg/dL (ref 1.7–2.4)

## 2023-07-10 MED ORDER — SENNA 8.6 MG PO TABS: 1.0000 | ORAL_TABLET | Freq: Two times a day (BID) | ORAL | 0 refills | Status: AC

## 2023-07-10 MED ORDER — OXYCODONE HCL 5 MG PO TABS: 5.0000 mg | ORAL_TABLET | Freq: Four times a day (QID) | ORAL | Status: DC | PRN

## 2023-07-10 MED ORDER — ACETAMINOPHEN 325 MG PO TABS: 650.0000 mg | ORAL_TABLET | Freq: Four times a day (QID) | ORAL | 0 refills | Status: AC | PRN

## 2023-07-10 MED ORDER — POLYETHYLENE GLYCOL 3350 17 G PO PACK: 17.0000 g | PACK | Freq: Every day | ORAL | 0 refills | Status: AC | PRN

## 2023-07-10 MED ORDER — ONDANSETRON HCL 4 MG PO TABS: 4.0000 mg | ORAL_TABLET | Freq: Four times a day (QID) | ORAL | 0 refills | Status: AC | PRN

## 2023-07-10 MED ORDER — OXYCODONE HCL 5 MG PO TABS: 5.0000 mg | ORAL_TABLET | Freq: Four times a day (QID) | ORAL | 0 refills | Status: AC | PRN

## 2023-07-10 MED ORDER — POTASSIUM CHLORIDE CRYS ER 20 MEQ PO TBCR
40.0000 meq | EXTENDED_RELEASE_TABLET | Freq: Once | ORAL | Status: AC
  Administered 2023-07-10: 40 meq via ORAL
  Filled 2023-07-10: qty 2

## 2023-07-10 MED ORDER — MAGNESIUM SULFATE 2 GM/50ML IV SOLN
2.0000 g | Freq: Once | INTRAVENOUS | Status: AC
  Administered 2023-07-10: 2 g via INTRAVENOUS
  Filled 2023-07-10: qty 50

## 2023-07-10 NOTE — Progress Notes (Signed)
 Mobility Specialist Progress Note:    07/10/23 1023  Mobility  Activity Ambulated with assistance in room  Level of Assistance Contact guard assist, steadying assist  Assistive Device Front wheel walker  Distance Ambulated (ft) 10 ft  Activity Response Tolerated well  Mobility Referral Yes  Mobility visit 1 Mobility  Mobility Specialist Start Time (ACUTE ONLY) 0900  Mobility Specialist Stop Time (ACUTE ONLY) 0913  Mobility Specialist Time Calculation (min) (ACUTE ONLY) 13 min   Received pt in bed and agreeable mobility. Pt requested to brush teeth at sink, declined further ambulation, reason not specified. Returned pt to bed with personal belongings and call light within reach. All needs met.   Lavanda Pollack Mobility Specialist  Please contact via [employer] or  Rehab Office [phone]

## 2023-07-10 NOTE — Plan of Care (Signed)

## 2023-07-10 NOTE — TOC Progression Note (Signed)
 Transition of Care [facility]) - Progression Note    Patient Details  Name: Gabriela Campbell MRN: [id number] Date of Birth: 1965-12-02  Transition of Care [facility]) CM/SW Contact  Roxie KANDICE Stain, RN Phone Number: 07/10/2023, 12:18 PM  Clinical Narrative:     Patient is active will [facility] for PT, CSW. Cory with [facility] will reaccept patient.  Need PT, CSW home health orders.  Expected Discharge Plan: Home w Home Health Services Barriers to Discharge: Continued Medical Work up  Expected Discharge Plan and Services   Discharge Planning Services: CM Consult Post Acute Care Choice: Home Health, Resumption of Svcs/PTA Provider Living arrangements for the past 2 months: Single Family Home                           HH Arranged: PT, Social Work [employer] Agency: [facility] Date [facility] Agency Contacted: 07/09/23 Time HH Agency Contacted: 1557 Representative spoke with at [facility] Agency: Darleene   Social Determinants of Health (SDOH) Interventions SDOH Screenings   Food Insecurity: No Food Insecurity (07/08/2023)  Housing: Low Risk  (07/08/2023)  Transportation Needs: No Transportation Needs (07/08/2023)  Utilities: Not At Risk (07/08/2023)  Depression (PHQ2-9): High Risk (10/25/2020)  Social Connections: Socially Isolated (05/04/2023)  Tobacco Use: High Risk (07/07/2023)    Readmission Risk Interventions    04/11/2023   12:02 PM  Readmission Risk Prevention Plan  Post Dischage Appt Complete  Medication Screening Complete  Transportation Screening Complete

## 2023-07-10 NOTE — Progress Notes (Signed)
 Venous duplex lower ext  has been completed. Refer to [facility] under chart review to view preliminary results.   07/10/2023  12:11 PM Zhanae Proffit, Ricka BIRCH

## 2023-07-10 NOTE — TOC Transition Note (Signed)
 Transition of Care [facility]) - Discharge Note   Patient Details  Name: Gabriela Campbell MRN: [id number] Date of Birth: 04-12-1965  Transition [facility] Care [facility]) CM/SW Contact:  Roxie KANDICE Stain, RN Phone Number: 07/10/2023, 3:18 PM   Clinical Narrative:    Patient stable for discharge.  Notified Cory with [facility] of discharge.  No other TOC needs at this time.    Final next level of care: Home w Home Health Services Barriers to Discharge: Barriers Resolved   Patient Goals and CMS Choice Patient states their goals for this hospitalization and ongoing recovery are:: to return home with [facility]          Discharge Placement  Home                     Discharge Plan and Services Additional resources added to the After Visit Summary for     Discharge Planning Services: CM Consult Post Acute Care Choice: Home Health, Resumption of Svcs/PTA Provider                    HH Arranged: PT, OT, Social Work [facility] Agency: [facility] Date [facility] Agency Contacted: 07/10/23 Time HH Agency Contacted: [phone] Representative spoke with at [facility] Agency: Darleene  Social Drivers of Health (SDOH) Interventions SDOH Screenings   Food Insecurity: No Food Insecurity (07/08/2023)  Housing: Low Risk  (07/08/2023)  Transportation Needs: No Transportation Needs (07/08/2023)  Utilities: Not At Risk (07/08/2023)  Depression (PHQ2-9): High Risk (10/25/2020)  Social Connections: Socially Isolated (05/04/2023)  Tobacco Use: High Risk (07/07/2023)     Readmission Risk Interventions    04/11/2023   12:02 PM  Readmission Risk Prevention Plan  Post Dischage Appt Complete  Medication Screening Complete  Transportation Screening Complete

## 2023-07-10 NOTE — Progress Notes (Signed)
 Occupational Therapy Treatment Patient Details Name: NARMEEN KERPER MRN: [id number] DOB: [birth year] Today's Date: 07/10/2023   History of present illness Pt is a 58 yr old female who presented due to weakness in BLE. MRI no new lessions. PMH: MS, HTN, shingles, hemoglobin cytosis, polycythemia history of tobacco abuse   OT comments  Pt preparing to discharge. Waiting for paper scrubs. Mod I for bed mobility. CGA with RW for ambulation to bathroom for toileting and sink for grooming. Educated in fall prevention. Updated d/c recommendation to HHOT.       If plan is discharge home, recommend the following:  A little help with walking and/or transfers;Assistance with cooking/housework;Assist for transportation;Help with stairs or ramp for entrance   Equipment Recommendations  BSC/3in1    Recommendations for Other Services      Precautions / Restrictions Precautions Precautions: Fall Recall of Precautions/Restrictions: Intact Precaution/Restrictions Comments: pt reports freq falls Restrictions Weight Bearing Restrictions Per Provider Order: No       Mobility Bed Mobility Overal bed mobility: Modified Independent                  Transfers Overall transfer level: Needs assistance Equipment used: Rolling walker (2 wheels) Transfers: Sit to/from Stand Sit to Stand: Contact guard assist                 Balance Overall balance assessment: Needs assistance   Sitting balance-Leahy Scale: Good     Standing balance support: Bilateral upper extremity supported, Single extremity supported Standing balance-Leahy Scale: Fair Standing balance comment: leans on sink in standing to wash hands after toileting                           ADL either performed or assessed with clinical judgement   ADL Overall ADL's : Needs assistance/impaired     Grooming: Contact guard assist;Standing;Wash/dry hands                   Toilet Transfer: Contact guard  assist;Ambulation;Rolling walker (2 wheels)   Toileting- Clothing Manipulation and Hygiene: Contact guard assist;Sit to/from stand       Functional mobility during ADLs: Contact guard assist;Rolling walker (2 wheels)      Extremity/Trunk Assessment              Vision       Perception     Praxis     Communication Communication Communication: No apparent difficulties   Cognition Arousal: Alert Behavior During Therapy: WFL for tasks assessed/performed Cognition: No apparent impairments                               Following commands: Intact        Cueing   Cueing Techniques: Verbal cues  Exercises      Shoulder Instructions       General Comments      Pertinent Vitals/ Pain       Pain Assessment Pain Assessment: Faces Faces Pain Scale: Hurts whole lot Pain Location: bilat LEs Pain Descriptors / Indicators: Discomfort Pain Intervention(s): Monitored during session, Repositioned  Home Living                                          Prior Functioning/Environment  Frequency  Min 2X/week        Progress Toward Goals  OT Goals(current goals can now be found in the care plan section)  Progress towards OT goals: Progressing toward goals  Acute Rehab OT Goals OT Goal Formulation: With patient Time For Goal Achievement: 07/22/23 Potential to Achieve Goals: Good  Plan      Co-evaluation                 AM-PAC OT 6 Clicks Daily Activity     Outcome Measure   Help from another person eating meals?: None Help from another person taking care of personal grooming?: A Little Help from another person toileting, which includes using toliet, bedpan, or urinal?: A Little Help from another person bathing (including washing, rinsing, drying)?: A Little Help from another person to put on and taking off regular upper body clothing?: A Little Help from another person to put on and taking off regular  lower body clothing?: A Little 6 Click Score: 19    End of Session Equipment Utilized During Treatment: Gait belt;Rolling walker (2 wheels)  OT Visit Diagnosis: Unsteadiness on feet (R26.81);Other abnormalities of gait and mobility (R26.89);Muscle weakness (generalized) (M62.81);Pain   Activity Tolerance Patient tolerated treatment well   Patient Left in bed;with call bell/phone within reach   Nurse Communication          Time: 8495-8482 OT Time Calculation (min): 13 min  Charges: OT General Charges $OT Visit: 1 Visit OT Treatments $Self Care/Home Management : 8-22 mins  Mliss HERO, OTR/L Acute Rehabilitation Services Office: [phone]   Kennth Mliss Helling 07/10/2023, 3:20 PM

## 2023-07-10 NOTE — Progress Notes (Signed)
 Physical Therapy Treatment Patient Details Name: Gabriela Campbell MRN: [id number] DOB: [birth year] Today's Date: 07/10/2023   History of Present Illness Pt is a 58 yr old female who presented due to weakness in BLE. MRI no new lessions. PMH: MS, HTN, shingles, hemoglobin cytosis, polycythemia history of tobacco abuse    PT Comments  Despite report of 10/10 bilat LE pain pt with improved gait kinematics and tolerance this date with RW and contact guard assist for about 80'. Pt transferred to/from BSCx2 and was able to complete pericare with contact guard and unilateral UE support. Pt continues to be at increased falls risk with decreased safety awareness as pt relying on rolling tray table for support despite verbal cues not to. Pt to benefit from HHPT services to address bilat LE strengthening and improve balance to minimize fall risk. Acute PT to cont to follow.    If plan is discharge home, recommend the following: A little help with walking and/or transfers;A little help with bathing/dressing/bathroom;Assist for transportation;Help with stairs or ramp for entrance   Can travel by private vehicle        Equipment Recommendations  None recommended by PT    Recommendations for Other Services       Precautions / Restrictions Precautions Precautions: Fall Recall of Precautions/Restrictions: Intact Precaution/Restrictions Comments: pt reports freq falls Restrictions Weight Bearing Restrictions Per Provider Order: No     Mobility  Bed Mobility Overal bed mobility: Modified Independent             General bed mobility comments: HOB elevated    Transfers Overall transfer level: Needs assistance Equipment used: Rolling walker (2 wheels) Transfers: Sit to/from Stand Sit to Stand: Contact guard assist           General transfer comment: pt with decreased safety awareness holding onto tray table despite verbal cues informing pt that it moves and to use RW Only. pt observed  furniture walking to complete step pvt transfer to/from [name] to EOB using tray table.    Ambulation/Gait Ambulation/Gait assistance: Contact guard assist Gait Distance (Feet): 80 Feet Assistive device: Rolling walker (2 wheels) Gait Pattern/deviations: Step-to pattern, Decreased stride length, Trunk flexed, Narrow base of support, Knees buckling Gait velocity: dec Gait velocity interpretation: <1.31 ft/sec, indicative of household ambulator   General Gait Details: pt with improved ability to maintain upright posture this date, pt with less ataxia and no knee buckling this date. Pt with noted increased dependency on UEs due to bilat LE 10/10 pain. Pt with impaired co-ordination/mild ataxia but improved from previous session   Stairs             Wheelchair Mobility     Tilt Bed    Modified Rankin (Stroke Patients Only)       Balance Overall balance assessment: Needs assistance Sitting-balance support: Feet supported Sitting balance-Leahy Scale: Good     Standing balance support: Bilateral upper extremity supported, Single extremity supported Standing balance-Leahy Scale: Fair Standing balance comment: Pt able to complete some unilateral standing for less then 1 min to complete pericare s/p tolieting                            Communication Communication Communication: No apparent difficulties  Cognition Arousal: Alert Behavior During Therapy: WFL for tasks assessed/performed                           PT -  Cognition Comments: pt aware that falling frequenctly isn't good Following commands: Intact      Cueing Cueing Techniques: Verbal cues  Exercises      General Comments General comments (skin integrity, edema, etc.): VSS, noted blood draining from R forearm IV, RN notifed      Pertinent Vitals/Pain Pain Assessment Pain Assessment: 0-10 Pain Score: 10-Worst pain ever Pain Location: bilat LEs Pain Descriptors / Indicators:  Discomfort Pain Intervention(s): Monitored during session    Home Living                          Prior Function            PT Goals (current goals can now be found in the care plan section) Acute Rehab PT Goals Patient Stated Goal: stop leg pain PT Goal Formulation: With patient Time For Goal Achievement: 07/22/23 Potential to Achieve Goals: Good Progress towards PT goals: Progressing toward goals    Frequency    Min 3X/week      PT Plan      Co-evaluation              AM-PAC PT 6 Clicks Mobility   Outcome Measure  Help needed turning from your back to your side while in a flat bed without using bedrails?: None Help needed moving from lying on your back to sitting on the side of a flat bed without using bedrails?: A Little Help needed moving to and from a bed to a chair (including a wheelchair)?: A Little Help needed standing up from a chair using your arms (e.g., wheelchair or bedside chair)?: A Little Help needed to walk in hospital room?: A Little Help needed climbing 3-5 steps with a railing? : Total 6 Click Score: 17    End of Session Equipment Utilized During Treatment: Gait belt Activity Tolerance: Patient limited by pain Patient left: in bed;with call bell/phone within reach;with bed alarm set Nurse Communication: Mobility status PT Visit Diagnosis: Unsteadiness on feet (R26.81);Repeated falls (R29.6);Muscle weakness (generalized) (M62.81);History of falling (Z91.81)     Time: 8988-8974 PT Time Calculation (min) (ACUTE ONLY): 14 min  Charges:    $Gait Training: 8-22 mins PT General Charges $$ ACUTE PT VISIT: 1 Visit                     Norene Ames, PT, DPT Acute Rehabilitation Services Secure chat preferred Office #: [phone]    Norene CHRISTELLA Ames 07/10/2023, 11:29 AM

## 2023-07-10 NOTE — Discharge Summary (Signed)
 Physician Discharge Summary   [name] MRN: [id number] DOB: 03-Jul-1965  Admit date:     07/07/2023  Discharge date: 07/10/2023  Discharge Physician: Alejandro Marker, DO   PCP: [facility]   Recommendations at discharge:   F/u with PCP w/in 1-2 weeks and repeat CBC/CMP/Mag/Phos w/in 1 week F/U w/ Neurology w/in 1-2 weeks   Discharge Diagnoses: Principal Problem:   Multiple sclerosis (HCC) Active Problems:   Relapsing remitting multiple sclerosis (HCC)   Polycythemia   Tobacco use   Bilateral leg weakness   Essential hypertension   Hepatic fibrosis, advanced fibrosis  Resolved Problems:   * No resolved hospital problems. [facility] Course: Patient is 58 y.o. female with HTN,'s, neuropathy, history of shingles with medical history significant of hypertension multiple sclerosis, polycythemia, chronic pain, hemochromatosis, nicotine  use presented from home with lower extremity weakness.  Patient reported weakness and pain in the legs for the past 2 days but worse on the day of admission with nausea, decreased appetite.  No vomiting or diarrhea. Neurology was consulted and recommended MRI brain, C-spine, T-spine and showed no new lesions and Neuro felt it was a pseudo exacerbation due to electrolyte abnormalities. Now Stable for D/C given improvement and declining CIR and going home w/ Home Health.   Assessment and Plan:  Worsening of bilateral lower extremity weakness - Neurology was consulted, recommended MRI brain, C-spine, T-spine.  MRI brain, C-spine showed unchanged distortion of chronic cerebral demyelinating lesions, no new lesions.  Unchanged appearance of cervical spinal cord lesions at C4 and C5, no new lesions. - MRI T-spine with stable lesions in the cord at T5, T6-7 and T8-9 c/w demyelinating disease - Per neurology, Dr. Jerrie, neurology likely pseudo exacerbation due to electrolyte abnormalities.  - During previous admission in 05/2023, patient  was placed on Flexeril  10 mg 3 times daily as needed and Lyrica  25 mg 3 times daily, resumed - PT OT evaluation recommended CIR, consult placed but patient is ineligible for CIR and electing to go home with Home Health Services w/ PT/OT and BSC Commode and 3 in 1 along with RW   Acute on Chronic pain in lower extremities:  Patient has been complaining of acute on chronic pain in her lower extremities. Had MRI imaging in February of her Lumbar Spine and Negative.  As per neurology, there is no acute flare of MS, will rule out DVT and LE venous Duplex negative.  Has history of neuropathy and chronic pain, but reports pain is worse but better with Narcotis.  If no significant improvement in weakness, pain, can consider MRI of the L-spine repeat but this can be done in the outpatient setting. Continue oxycodone  and will give her a short course for D/C   History of Relapsing/ Remitting multiple sclerosis [facility]): -Patient has not been on disease modifying therapy, needs to follow outpatient with [facility] neurology Dr. Onita.  Per neurology, currently no active flare.   HypoKalemia: Mild and Improving. K+ went from 3.1 -> 3.3 -> 3.7. Replete w/ po Kcl 40 mEQ x1. CTM and Replete as Necessary. Repeat CMP w/in week   Hypomagnesemia:  Magnesium  now improved at 1.7 but will replete w/ IV Mag Sulfate 2 grams.    Polycythemia: H&H stable. Hgb/Hct Trend:  Recent Labs  Lab 07/07/23 1354 07/08/23 0603 07/09/23 0942 07/10/23 0515  HGB 15.9* 13.7 12.7 13.3  HCT 47.4* 39.7 37.9 39.2  MCV 96.3 95.0 96.7 97.3  -Improved and will need F/U   Tobacco Use:  Nicotine  patch   Essential Hypertension:  Continue Norvasc    Hepatic fibrosis, advanced fibrosis -Alk phos improving, outpatient follow-up with PCP   Thrombocytopenia: Plt Count went from 127 -> 117 -> 118. CTM for S/Sx of Bleeding; No overt bleeding noted. Repeat CBC in the AM  Hypoalbuminemia: Albumin Lvl went from 3.0 -> 2.7 -> 2.8. CTM and Trend and repeat  CMP in the AM  Consultants: Neuro Procedures performed: As delineated as above   Disposition: Home health as declining CIR  Diet recommendation:  Discharge Diet Orders (From admission, onward)     Start     Ordered   07/10/23 0000  Diet general        07/10/23 1425           Cardiac diet DISCHARGE MEDICATION: Allergies as of 07/10/2023       Reactions   Dilaudid [hydromorphone Hcl] Anaphylaxis, Rash   Fish Allergy Anaphylaxis   Fish-derived Products Anaphylaxis   Ibuprofen Anaphylaxis   Iodine Anaphylaxis   Penicillins Anaphylaxis   Shellfish Allergy Anaphylaxis   Shrimp Extract Anaphylaxis   Tramadol  Anaphylaxis, Nausea And Vomiting   Baclofen  Other (See Comments)   Makes the patient feel wobbly   Gabapentin  Other (See Comments)   GI upset   Codeine Nausea Only        Medication List     TAKE these medications    acetaminophen  325 MG tablet Commonly known as: TYLENOL  Take 2 tablets (650 mg total) by mouth every 6 (six) hours as needed for mild pain (pain score 1-3) (or Fever >/= 101).   amLODipine  5 MG tablet Commonly known as: NORVASC  Take 0.5 tablets (2.5 mg total) by mouth daily. What changed:  how much to take when to take this   methocarbamol  500 MG tablet Commonly known as: ROBAXIN  Take 1 tablet (500 mg total) by mouth every 8 (eight) hours as needed for muscle spasms.   ondansetron  4 MG tablet Commonly known as: ZOFRAN  Take 1 tablet (4 mg total) by mouth every 6 (six) hours as needed for nausea.   oxyCODONE  5 MG immediate release tablet Commonly known as: Oxy IR/ROXICODONE  Take 1 tablet (5 mg total) by mouth every 6 (six) hours as needed for up to 3 days for moderate pain (pain score 4-6). What changed: reasons to take this   polyethylene glycol 17 g packet Commonly known as: MIRALAX  / GLYCOLAX  Take 17 g by mouth daily as needed for mild constipation.   pregabalin  25 MG capsule Commonly known as: Lyrica  Take 1 capsule (25 mg total) by  mouth 3 (three) times daily.   senna 8.6 MG Tabs tablet Commonly known as: SENOKOT Take 1 tablet (8.6 mg total) by mouth 2 (two) times daily.   ursodiol  300 MG capsule Commonly known as: ACTIGALL  Take 600 mg by mouth in the morning.   VITAMIN B12 PO Take 1 tablet by mouth daily.   Vitamin D3 25 MCG (1000 UT) Caps Take 2,000 Units by mouth daily.        Follow-up Information     [facility] Follow up.   Specialty: Home Health Services Why: home health will resume. Contact information: [address] [place] [address] [phone]                Discharge Exam: Filed Weights   07/08/23 0100  Weight: 46.8 kg   Vitals:   07/10/23 0336 07/10/23 0733  BP: 124/82 128/80  Pulse: (!) 59 63  Resp: 16   Temp: 98.4 F (36.9 C) 98 F (36.7 C)  SpO2: 100% 98%   Examination: Physical Exam:  Constitutional: Chronically ill appearing Caucasian female in NAD Respiratory: Diminished to auscultation bilaterally, no wheezing, rales, rhonchi or crackles. Normal respiratory effort and patient is not tachypenic. No accessory muscle use. Unlabored breathing  Cardiovascular: RRR, no murmurs / rubs / gallops. S1 and S2 auscultated. No extremity edema Abdomen: Soft, non-tender, non-distended. Bowel sounds positive.  GU: Deferred. Musculoskeletal: No clubbing / cyanosis of digits/nails. No joint deformity upper and lower extremities.  Skin: No rashes, lesions, ulcers on a limited skin evaluation. No induration; Warm and dry.  Neurologic: CN 2-12 grossly intact with no focal deficits. Romberg sign and cerebellar reflexes not assessed.  Psychiatric: Normal judgment and insight. Alert and oriented x 3. Normal mood and appropriate affect.   Condition at discharge: stable  The results of significant diagnostics from this hospitalization (including imaging, microbiology, ancillary and laboratory) are listed below for reference.   Imaging Studies: VAS US   LOWER EXTREMITY VENOUS (DVT) Result Date: 07/10/2023  Lower Venous DVT Study Patient Name:  SHAHIDAH NESBITT  Date of Exam:   07/10/2023 Medical Rec #: [id number]          Accession #:    [id number] Date of Birth: 21-Jan-1965         Patient Gender: F Patient Age:   57 years Exam Location:  [facility] Procedure:      VAS US  LOWER EXTREMITY VENOUS (DVT) Referring Phys: RIPUDEEP RAI --------------------------------------------------------------------------------  Indications: Lowr extremity weakness. History of multiple sclerosis., and Edema.  Comparison Study: No priors. Performing Technologist: Ricka Sturdivant-Jones RDMS, RVT  Examination Guidelines: A complete evaluation includes B-mode imaging, spectral Doppler, color Doppler, and power Doppler as needed of all accessible portions of each vessel. Bilateral testing is considered an integral part of a complete examination. Limited examinations for reoccurring indications may be performed as noted. The reflux portion of the exam is performed with the patient in reverse Trendelenburg.  +---------+---------------+---------+-----------+----------+--------------+ RIGHT    CompressibilityPhasicitySpontaneityPropertiesThrombus Aging +---------+---------------+---------+-----------+----------+--------------+ CFV      Full           Yes      Yes                                 +---------+---------------+---------+-----------+----------+--------------+ SFJ      Full                                                        +---------+---------------+---------+-----------+----------+--------------+ FV Prox  Full                                                        +---------+---------------+---------+-----------+----------+--------------+ FV Mid   Full                                                        +---------+---------------+---------+-----------+----------+--------------+ FV DistalFull                                                         +---------+---------------+---------+-----------+----------+--------------+  PFV      Full                                                        +---------+---------------+---------+-----------+----------+--------------+ POP      Full           Yes      Yes                                 +---------+---------------+---------+-----------+----------+--------------+ PTV      Full                                                        +---------+---------------+---------+-----------+----------+--------------+ PERO     Full                                                        +---------+---------------+---------+-----------+----------+--------------+   +---------+---------------+---------+-----------+----------+--------------+ LEFT     CompressibilityPhasicitySpontaneityPropertiesThrombus Aging +---------+---------------+---------+-----------+----------+--------------+ CFV      Full           Yes      Yes                                 +---------+---------------+---------+-----------+----------+--------------+ SFJ      Full                                                        +---------+---------------+---------+-----------+----------+--------------+ FV Prox  Full                                                        +---------+---------------+---------+-----------+----------+--------------+ FV Mid   Full                                                        +---------+---------------+---------+-----------+----------+--------------+ FV DistalFull                                                        +---------+---------------+---------+-----------+----------+--------------+ PFV      Full                                                        +---------+---------------+---------+-----------+----------+--------------+  POP      Full           Yes      Yes                                  +---------+---------------+---------+-----------+----------+--------------+ PTV      Full                                                        +---------+---------------+---------+-----------+----------+--------------+ PERO     Full                                                        +---------+---------------+---------+-----------+----------+--------------+     Summary: BILATERAL: - No evidence of deep vein thrombosis seen in the lower extremities, bilaterally. -No evidence of popliteal cyst, bilaterally.   *See table(s) above for measurements and observations. Electronically signed by Debby Robertson on 07/10/2023 at 5:05:14 PM.    Final    MR THORACIC SPINE W WO CONTRAST Result Date: 07/08/2023 CLINICAL DATA:  Multiple sclerosis (MS) EXAM: MRI THORACIC WITHOUT AND WITH CONTRAST TECHNIQUE: Multiplanar and multiecho pulse sequences of the thoracic spine were obtained without and with intravenous contrast. CONTRAST:  4.5mL GADAVIST  GADOBUTROL  1 MMOL/ML IV SOLN COMPARISON:  MRI of the thoracic spine dated February 13, 2023. FINDINGS: Alignment:  Exaggerated thoracic kyphosis. Vertebrae: Hemangiomas within the T2, T8 and T10 vertebral bodies. No concerning lesions. No fractures. Cord: T2 hyperintense lesions are again demonstrated at T5, T6-7 and T8-9. There are no new lesions evident. There is no abnormal enhancement of the spinal cord or nerve roots. Paraspinal and other soft tissues: Streaky infiltrate/atelectasis present dependently within the upper lung zones bilaterally. Disc levels: The disc spaces are satisfactory preserved. There is no focal disc herniation or significant spinal canal or neural foraminal stenosis. IMPRESSION: 1. Stable lesions within the spinal cord at T5, T6-7 and T8-9, compatible with demyelinating disease. Electronically Signed   By: Evalene Coho M.D.   On: 07/08/2023 11:31   MR BRAIN WO CONTRAST Result Date: 07/08/2023 CLINICAL DATA:  Multiple sclerosis EXAM: MRI  HEAD WITHOUT CONTRAST MRI CERVICAL SPINE WITHOUT CONTRAST TECHNIQUE: Multiplanar, multiecho pulse sequences of the brain and surrounding structures, and cervical spine, to include the craniocervical junction and cervicothoracic junction, were obtained without intravenous contrast. COMPARISON:  05/04/2023 FINDINGS: MRI HEAD FINDINGS Brain: No acute infarct, mass effect or extra-axial collection. No acute or chronic hemorrhage. Unchanged distribution of supratentorial periventricular and deep white matter lesions. No new lesions. The midline structures are normal. Vascular: Normal flow voids. Skull and upper cervical spine: Normal calvarium and skull base. Visualized upper cervical spine and soft tissues are normal. Sinuses/Orbits:No paranasal sinus fluid levels or advanced mucosal thickening. Left mastoid effusion. Normal orbits. MRI CERVICAL SPINE FINDINGS Alignment: Physiologic. Vertebrae: No fracture, evidence of discitis, or bone lesion. Cord: Imaging of the spinal cord is markedly motion degraded. Unchanged appearance of lesions at C4 and C5. Posterior Fossa, vertebral arteries, paraspinal tissues: Negative. Disc levels: Minimal degenerative changes, as previously demonstrated. No spinal canal or neural foraminal stenosis. IMPRESSION: 1. Unchanged distribution of  chronic cerebral demyelinating lesions. No new lesions. 2. Unchanged appearance of cervical spinal cord lesions at C4 and C5. No new lesions. Electronically Signed   By: Franky Stanford M.D.   On: 07/08/2023 00:05   MR CERVICAL SPINE WO CONTRAST Result Date: 07/08/2023 CLINICAL DATA:  Multiple sclerosis EXAM: MRI HEAD WITHOUT CONTRAST MRI CERVICAL SPINE WITHOUT CONTRAST TECHNIQUE: Multiplanar, multiecho pulse sequences of the brain and surrounding structures, and cervical spine, to include the craniocervical junction and cervicothoracic junction, were obtained without intravenous contrast. COMPARISON:  05/04/2023 FINDINGS: MRI HEAD FINDINGS Brain: No  acute infarct, mass effect or extra-axial collection. No acute or chronic hemorrhage. Unchanged distribution of supratentorial periventricular and deep white matter lesions. No new lesions. The midline structures are normal. Vascular: Normal flow voids. Skull and upper cervical spine: Normal calvarium and skull base. Visualized upper cervical spine and soft tissues are normal. Sinuses/Orbits:No paranasal sinus fluid levels or advanced mucosal thickening. Left mastoid effusion. Normal orbits. MRI CERVICAL SPINE FINDINGS Alignment: Physiologic. Vertebrae: No fracture, evidence of discitis, or bone lesion. Cord: Imaging of the spinal cord is markedly motion degraded. Unchanged appearance of lesions at C4 and C5. Posterior Fossa, vertebral arteries, paraspinal tissues: Negative. Disc levels: Minimal degenerative changes, as previously demonstrated. No spinal canal or neural foraminal stenosis. IMPRESSION: 1. Unchanged distribution of chronic cerebral demyelinating lesions. No new lesions. 2. Unchanged appearance of cervical spinal cord lesions at C4 and C5. No new lesions. Electronically Signed   By: Franky Stanford M.D.   On: 07/08/2023 00:05   CT Lumbar Spine Wo Contrast Result Date: 06/25/2023 CLINICAL DATA:  Trauma, lumbar radiculopathy, mechanical fall. EXAM: CT LUMBAR SPINE WITHOUT CONTRAST TECHNIQUE: Multidetector CT imaging of the lumbar spine was performed without intravenous contrast administration. Multiplanar CT image reconstructions were also generated. RADIATION DOSE REDUCTION: This exam was performed according to the departmental dose-optimization program which includes automated exposure control, adjustment of the mA and/or kV according to patient size and/or use of iterative reconstruction technique. COMPARISON:  CT 04/08/2023 FINDINGS: Segmentation: 5 lumbar type vertebrae. Alignment: No evidence of traumatic listhesis. Vertebrae: No acute fracture. Paraspinal and other soft tissues: No acute  abnormality. Disc levels: Intervertebral disc space height is maintained. No severe spinal canal or neural foraminal narrowing. IMPRESSION: No acute fracture or evidence of traumatic listhesis. Electronically Signed   By: Norman Gatlin M.D.   On: 06/25/2023 16:00   Microbiology: Results for orders placed or performed during the hospital encounter of 02/13/23  Urine Culture     Status: Abnormal   Collection Time: 02/13/23  1:39 AM   Specimen: Urine, Random  Result Value Ref Range Status   Specimen Description   Final    URINE, RANDOM Performed at [facility], [address] [place], [place] [address]    Special Requests   Final    NONE Reflexed from [phone] [id number] Performed at [facility], [address], [place], [place] [address]    Culture (A)  Final    >=100,000 COLONIES/mL AEROCOCCUS SPECIES Standardized susceptibility testing for this organism is not available. Performed at [facility], [address] [place], [place] [address]    Report Status 02/17/2023 FINAL  Final   Labs: CBC: Recent Labs  Lab 07/07/23 1354 07/08/23 0603 07/09/23 0942 07/10/23 0515  WBC 7.5 7.2 6.8 8.2  NEUTROABS 5.0  --   --   --   HGB 15.9* 13.7 12.7 13.3  HCT 47.4* 39.7 37.9 39.2  MCV 96.3 95.0 96.7  97.3  PLT 151 127* 117* 118*   Basic Metabolic Panel: Recent Labs  Lab 07/07/23 1743 07/08/23 0603 07/09/23 0942 07/10/23 0515 07/10/23 0922  NA 137 141 144 136  --   K 3.6 3.1* 3.3* 3.7  --   CL 99 104 108 103  --   CO2 27 27 25 27   --   GLUCOSE 102* 76 116* 131*  --   BUN 14 11 9 10   --   CREATININE 0.64 0.79 0.69 0.71  --   CALCIUM 9.1 8.8* 8.4* 8.3*  --   MG  --  1.6* 1.9  --  1.7  PHOS  --  3.0 3.4 3.9  --    Liver Function Tests: Recent Labs  Lab 07/08/23 0603 07/09/23 0942 07/10/23 0515  AST 19  --   --   ALT 26  --   --   ALKPHOS 183*  --   --   BILITOT 0.4  --   --   PROT 5.9*  --   --   ALBUMIN 3.0* 2.7* 2.8*   CBG: No  results for input(s): GLUCAP in the last 168 hours.  Discharge time spent: greater than 30 minutes.  Signed: Alejandro Marker, DO [facility] 07/12/2023

## 2023-07-10 NOTE — Hospital Course (Addendum)
 Patient is 58 y.o. female with HTN,'s, neuropathy, history of shingles with medical history significant of hypertension multiple sclerosis, polycythemia, chronic pain, hemochromatosis, nicotine  use presented from home with lower extremity weakness.  Patient reported weakness and pain in the legs for the past 2 days but worse on the day of admission with nausea, decreased appetite.  No vomiting or diarrhea. Neurology was consulted and recommended MRI brain, C-spine, T-spine and showed no new lesions and Neuro felt it was a pseudo exacerbation due to electrolyte abnormalities. Now Stable for D/C given improvement and declining CIR and going home w/ Home Health.   Assessment and Plan:  Worsening of bilateral lower extremity weakness - Neurology was consulted, recommended MRI brain, C-spine, T-spine.  MRI brain, C-spine showed unchanged distortion of chronic cerebral demyelinating lesions, no new lesions.  Unchanged appearance of cervical spinal cord lesions at C4 and C5, no new lesions. - MRI T-spine with stable lesions in the cord at T5, T6-7 and T8-9 c/w demyelinating disease - Per neurology, Dr. Jerrie, neurology likely pseudo exacerbation due to electrolyte abnormalities.  - During previous admission in 05/2023, patient was placed on Flexeril  10 mg 3 times daily as needed and Lyrica  25 mg 3 times daily, resumed - PT OT evaluation recommended CIR, consult placed but patient is ineligible for CIR and electing to go home with Home Health Services w/ PT/OT and BSC Commode and 3 in 1 along with RW   Acute on Chronic pain in lower extremities:  Patient has been complaining of acute on chronic pain in her lower extremities. Had MRI imaging in February of her Lumbar Spine and Negative.  As per neurology, there is no acute flare of MS, will rule out DVT and LE venous Duplex negative.  Has history of neuropathy and chronic pain, but reports pain is worse but better with Narcotis.  If no significant improvement in  weakness, pain, can consider MRI of the L-spine repeat but this can be done in the outpatient setting. Continue oxycodone  and will give her a short course for D/C   History of Relapsing/ Remitting multiple sclerosis [facility]): -Patient has not been on disease modifying therapy, needs to follow outpatient with [facility] neurology Dr. Onita.  Per neurology, currently no active flare.   HypoKalemia: Mild and Improving. K+ went from 3.1 -> 3.3 -> 3.7. Replete w/ po Kcl 40 mEQ x1. CTM and Replete as Necessary. Repeat CMP w/in week   Hypomagnesemia:  Magnesium  now improved at 1.7 but will replete w/ IV Mag Sulfate 2 grams.    Polycythemia: H&H stable. Hgb/Hct Trend:  Recent Labs  Lab 07/07/23 1354 07/08/23 0603 07/09/23 0942 07/10/23 0515  HGB 15.9* 13.7 12.7 13.3  HCT 47.4* 39.7 37.9 39.2  MCV 96.3 95.0 96.7 97.3  -Improved and will need F/U   Tobacco Use: Nicotine  patch   Essential Hypertension:  Continue Norvasc    Hepatic fibrosis, advanced fibrosis -Alk phos improving, outpatient follow-up with PCP   Thrombocytopenia: Plt Count went from 127 -> 117 -> 118. CTM for S/Sx of Bleeding; No overt bleeding noted. Repeat CBC in the AM  Hypoalbuminemia: Albumin Lvl went from 3.0 -> 2.7 -> 2.8. CTM and Trend and repeat CMP in the AM

## 2023-07-11 ENCOUNTER — Encounter (HOSPITAL_COMMUNITY)

## 2023-09-07 ENCOUNTER — Emergency Department (HOSPITAL_COMMUNITY)

## 2023-09-07 ENCOUNTER — Emergency Department (HOSPITAL_COMMUNITY)
Admission: EM | Admit: 2023-09-07 | Discharge: 2023-09-08 | Disposition: A | Discharge status: Home / Self Care | Attending: Emergency Medicine | Admitting: Emergency Medicine

## 2023-09-07 DIAGNOSIS — R531 Weakness: Secondary | ICD-10-CM | POA: Insufficient documentation

## 2023-09-07 DIAGNOSIS — I10 Essential (primary) hypertension: Secondary | ICD-10-CM | POA: Insufficient documentation

## 2023-09-07 DIAGNOSIS — Z79899 Other long term (current) drug therapy: Secondary | ICD-10-CM | POA: Diagnosis not present

## 2023-09-07 DIAGNOSIS — E876 Hypokalemia: Secondary | ICD-10-CM | POA: Insufficient documentation

## 2023-09-07 DIAGNOSIS — N3000 Acute cystitis without hematuria: Secondary | ICD-10-CM | POA: Insufficient documentation

## 2023-09-07 DIAGNOSIS — M545 Low back pain, unspecified: Secondary | ICD-10-CM | POA: Diagnosis present

## 2023-09-07 DIAGNOSIS — R29898 Other symptoms and signs involving the musculoskeletal system: Secondary | ICD-10-CM

## 2023-09-07 LAB — URINALYSIS, ROUTINE W REFLEX MICROSCOPIC
Bilirubin Urine: NEGATIVE
Glucose, UA: NEGATIVE mg/dL
Ketones, ur: NEGATIVE mg/dL
Nitrite: POSITIVE — AB
Protein, ur: 30 mg/dL — AB
Specific Gravity, Urine: 1.016 (ref 1.005–1.030)
pH: 7 (ref 5.0–8.0)

## 2023-09-07 LAB — COMPREHENSIVE METABOLIC PANEL WITH GFR
ALT: 22 U/L (ref 0–44)
AST: 26 U/L (ref 15–41)
Albumin: 4.1 g/dL (ref 3.5–5.0)
Alkaline Phosphatase: 390 U/L — ABNORMAL HIGH (ref 38–126)
Anion gap: 14 (ref 5–15)
BUN: 11 mg/dL (ref 6–20)
CO2: 28 mmol/L (ref 22–32)
Calcium: 9.5 mg/dL (ref 8.9–10.3)
Chloride: 104 mmol/L (ref 98–111)
Creatinine, Ser: 0.77 mg/dL (ref 0.44–1.00)
GFR, Estimated: >60 mL/min (ref >60)
Glucose, Bld: 81 mg/dL (ref 70–99)
Potassium: 3.2 mmol/L — ABNORMAL LOW (ref 3.5–5.1)
Sodium: 145 mmol/L (ref 135–145)
Total Bilirubin: 0.3 mg/dL (ref 0.0–1.2)
Total Protein: 7.1 g/dL (ref 6.5–8.1)

## 2023-09-07 LAB — CBC WITH DIFFERENTIAL/PLATELET
Abs Immature Granulocytes: 0.03 K/uL (ref 0.00–0.07)
Basophils Absolute: 0 K/uL (ref 0.0–0.1)
Basophils Relative: 0 %
Eosinophils Absolute: 0 K/uL (ref 0.0–0.5)
Eosinophils Relative: 0 %
HCT: 47.1 % — ABNORMAL HIGH (ref 36.0–46.0)
Hemoglobin: 15.3 g/dL — ABNORMAL HIGH (ref 12.0–15.0)
Immature Granulocytes: 0 %
Lymphocytes Relative: 27 %
Lymphs Abs: 2.6 K/uL (ref 0.7–4.0)
MCH: 31.3 pg (ref 26.0–34.0)
MCHC: 32.5 g/dL (ref 30.0–36.0)
MCV: 96.3 fL (ref 80.0–100.0)
Monocytes Absolute: 0.7 K/uL (ref 0.1–1.0)
Monocytes Relative: 7 %
Neutro Abs: 6.6 K/uL (ref 1.7–7.7)
Neutrophils Relative %: 66 %
Platelets: 170 K/uL (ref 150–400)
RBC: 4.89 MIL/uL (ref 3.87–5.11)
RDW: 13.1 % (ref 11.5–15.5)
WBC: 9.9 K/uL (ref 4.0–10.5)
nRBC: 0 % (ref 0.0–0.2)

## 2023-09-07 LAB — PHOSPHORUS: Phosphorus: 3 mg/dL (ref 2.5–4.6)

## 2023-09-07 LAB — MAGNESIUM: Magnesium: 2.1 mg/dL (ref 1.7–2.4)

## 2023-09-07 MED ORDER — OXYCODONE-ACETAMINOPHEN 5-325 MG PO TABS: 1.0000 | ORAL_TABLET | Freq: Four times a day (QID) | ORAL | 0 refills | Status: DC | PRN

## 2023-09-07 MED ORDER — NITROFURANTOIN MONOHYD MACRO 100 MG PO CAPS
100.0000 mg | ORAL_CAPSULE | Freq: Once | ORAL | Status: DC
  Filled 2023-09-07: qty 1

## 2023-09-07 MED ORDER — MIDAZOLAM HCL 2 MG/2ML IJ SOLN
2.0000 mg | Freq: Once | INTRAMUSCULAR | Status: AC | PRN
  Administered 2023-09-07: 2 mg via INTRAVENOUS
  Filled 2023-09-07: qty 2

## 2023-09-07 MED ORDER — OXYCODONE-ACETAMINOPHEN 5-325 MG PO TABS
1.0000 | ORAL_TABLET | Freq: Once | ORAL | Status: AC
  Administered 2023-09-07: 1 via ORAL
  Filled 2023-09-07: qty 1

## 2023-09-07 MED ORDER — NITROFURANTOIN MONOHYD MACRO 100 MG PO CAPS
100.0000 mg | ORAL_CAPSULE | Freq: Once | ORAL | Status: AC
  Administered 2023-09-07: 100 mg via ORAL
  Filled 2023-09-07: qty 1

## 2023-09-07 MED ORDER — FENTANYL CITRATE PF 50 MCG/ML IJ SOSY
25.0000 ug | PREFILLED_SYRINGE | Freq: Once | INTRAMUSCULAR | Status: AC
  Administered 2023-09-07: 25 ug via INTRAVENOUS
  Filled 2023-09-07: qty 1

## 2023-09-07 MED ORDER — SODIUM CHLORIDE 0.9 % IV SOLN
1000.0000 mg | Freq: Once | INTRAVENOUS | Status: AC
  Administered 2023-09-07: 1000 mg via INTRAVENOUS
  Filled 2023-09-07: qty 16

## 2023-09-07 MED ORDER — GADOBUTROL 1 MMOL/ML IV SOLN
4.6000 mL | Freq: Once | INTRAVENOUS | Status: AC | PRN
  Administered 2023-09-07: 4.6 mL via INTRAVENOUS

## 2023-09-07 MED ORDER — NITROFURANTOIN MONOHYD MACRO 100 MG PO CAPS: 100.0000 mg | ORAL_CAPSULE | Freq: Two times a day (BID) | ORAL | 0 refills | Status: DC

## 2023-09-07 NOTE — ED Triage Notes (Signed)
 Patient in today reporting bilateral leg weakness x3 weeks with lower back pain.

## 2023-09-07 NOTE — ED Provider Notes (Signed)
 [place] EMERGENCY DEPARTMENT AT [facility] Provider Note   CSN: [id number] Arrival date & time: 09/07/23  1455     Patient presents with: Extremity Weakness and Back Pain   Gabriela Campbell is a 58 y.o. female.  Extremity Weakness  Back Pain Patient is 58 year old female was in the ED today for concerns for low back pain, right leg pain described as cramps to anterior thigh and bilateral lower extremity weakness that has been progressive over 3 weeks, worse on right right  than left.  Notes that her legs feel like toothpicks holding a potato.  Notes that she has had no change in her neuropathy in hands or feet.  medical history of MS, with recent hospitalization for bilateral lower extremity weakness.  Notes that she has not been eating as frequently, only 1 meal a day as well as only having 1 to 2 glasses of water per day for the last 2 weeks.  States that she has been not hungry.  Denies trauma, saddle paresthesia, numbness, fecal/urinary incontinence, unexplained weight loss.  Denies fevers, headaches, vision changes, diplopia, vertigo, dysphagia, odynophagia, chest pain, shortness of breath, cough, congestion, abdominal pain, nausea, vomiting, diarrhea, constipation, melena, hematochezia, dysuria, hematuria, vaginal pain, lower leg swelling.     Prior to Admission medications   Medication Sig Start Date End Date Taking? Authorizing Provider  nitrofurantoin , macrocrystal-monohydrate, (MACROBID ) 100 MG capsule Take 1 capsule (100 mg total) by mouth 2 (two) times daily. 09/07/23  Yes Lucile Hillmann S, PA-C  oxyCODONE -acetaminophen  (PERCOCET/ROXICET) 5-325 MG tablet Take 1 tablet by mouth every 6 (six) hours as needed for severe pain (pain score 7-10). 09/07/23  Yes Alyha Marines S, PA-C  acetaminophen  (TYLENOL ) 325 MG tablet Take 2 tablets (650 mg total) by mouth every 6 (six) hours as needed for mild pain (pain score 1-3) (or Fever >/= 101). 07/10/23   Sheikh, Omair  Latif, DO  amLODipine  (NORVASC ) 5 MG tablet Take 0.5 tablets (2.5 mg total) by mouth daily. Patient taking differently: Take 5 mg by mouth in the morning. 02/14/23   Laurence Locus, DO  Cholecalciferol  (VITAMIN D3) 25 MCG (1000 UT) CAPS Take 2,000 Units by mouth daily.    [provider]  Cyanocobalamin  (VITAMIN B12 PO) Take 1 tablet by mouth daily.    [provider]  methocarbamol  (ROBAXIN ) 500 MG tablet Take 1 tablet (500 mg total) by mouth every 8 (eight) hours as needed for muscle spasms. Patient not taking: Reported on 07/07/2023 06/01/23   Desiderio Chew, PA-C  ondansetron  (ZOFRAN ) 4 MG tablet Take 1 tablet (4 mg total) by mouth every 6 (six) hours as needed for nausea. 07/10/23   Sherrill Cable Latif, DO  oxyCODONE  (OXY IR/ROXICODONE ) 5 MG immediate release tablet Take 1 tablet (5 mg total) by mouth every 6 (six) hours as needed for up to 3 days for moderate pain (pain score 4-6). 07/10/23 07/13/23  Sherrill Cable Latif, DO  polyethylene glycol (MIRALAX  / GLYCOLAX ) 17 g packet Take 17 g by mouth daily as needed for mild constipation. 07/10/23   Sherrill Cable Latif, DO  pregabalin  (LYRICA ) 25 MG capsule Take 1 capsule (25 mg total) by mouth 3 (three) times daily. Patient not taking: Reported on 07/07/2023 05/05/23 05/04/24  Jonel Lonni SQUIBB, MD  senna (SENOKOT) 8.6 MG TABS tablet Take 1 tablet (8.6 mg total) by mouth 2 (two) times daily. 07/10/23   Sherrill Cable Latif, DO  ursodiol  (ACTIGALL ) 300 MG capsule Take 600 mg by mouth in the  morning. 02/27/23 03/05/24  [provider]    Allergies: Dilaudid [hydromorphone hcl], Fish allergy, Fish-derived products, Ibuprofen, Iodine, Penicillins, Shellfish allergy, Shrimp extract, Tramadol , Baclofen , Gabapentin , and Codeine    Review of Systems  Musculoskeletal:  Positive for back pain, extremity weakness and myalgias.  All other systems reviewed and are negative.   Updated Vital Signs BP 136/88 (BP Location: Left Arm)   Pulse 65   Temp  98 F (36.7 C) (Oral)   Resp 18   SpO2 99%   Physical Exam Vitals and nursing note reviewed.  Constitutional:      General: She is not in acute distress.    Appearance: Normal appearance. She is not ill-appearing or diaphoretic.  HENT:     Head: Normocephalic and atraumatic.  Eyes:     General: No scleral icterus.       Right eye: No discharge.        Left eye: No discharge.     Extraocular Movements: Extraocular movements intact.     Conjunctiva/sclera: Conjunctivae normal.  Cardiovascular:     Rate and Rhythm: Normal rate and regular rhythm.     Pulses: Normal pulses.     Heart sounds: Normal heart sounds. No murmur heard.    No friction rub. No gallop.  Pulmonary:     Effort: Pulmonary effort is normal. No respiratory distress.     Breath sounds: No stridor. No wheezing, rhonchi or rales.  Chest:     Chest wall: No tenderness.  Abdominal:     General: Abdomen is flat. There is no distension.     Palpations: Abdomen is soft.     Tenderness: There is no abdominal tenderness. There is no right CVA tenderness, left CVA tenderness, guarding or rebound.  Musculoskeletal:        General: No swelling, deformity or signs of injury.     Cervical back: Normal range of motion. No rigidity.     Right lower leg: No edema.     Left lower leg: No edema.  Skin:    General: Skin is warm and dry.     Findings: No bruising, erythema or lesion.  Neurological:     General: No focal deficit present.     Mental Status: She is alert and oriented to person, place, and time. Mental status is at baseline.     Sensory: No sensory deficit.     Motor: Weakness present.     Comments: Noted to have tremors to bilateral upper extremities and lower extremities, worse with upper than lower.  Noted also have lower extremity weakness, noted to have 3 out of 5 strength to hip flexion and extension as well as knee flexion extension and dorsi flexion on right side.  While having 4 out of 5 strength to hip  flexion/extension and knee flexion/extension as well as dorsiflexion.  No facial asymmetry, no ataxia, no apraxia, no aphasia, no arm drift, normal coordination with finger-to-nose, no visual field deficits, no nystagmus.   Psychiatric:        Mood and Affect: Mood normal.     (all labs ordered are listed, but only abnormal results are displayed) Labs Reviewed  CBC WITH DIFFERENTIAL/PLATELET - Abnormal; Notable for the following components:      Result Value   Hemoglobin 15.3 (*)    HCT 47.1 (*)    All other components within normal limits  COMPREHENSIVE METABOLIC PANEL WITH GFR - Abnormal; Notable for the following components:   Potassium 3.2 (*)  Alkaline Phosphatase 390 (*)    All other components within normal limits  URINALYSIS, ROUTINE W REFLEX MICROSCOPIC - Abnormal; Notable for the following components:   APPearance CLOUDY (*)    Hgb urine dipstick SMALL (*)    Protein, ur 30 (*)    Nitrite POSITIVE (*)    Leukocytes,Ua SMALL (*)    Bacteria, UA RARE (*)    All other components within normal limits  URINE CULTURE  MAGNESIUM   PHOSPHORUS    EKG: None  Radiology: MR Brain W and Wo Contrast Result Date: 09/07/2023 CLINICAL DATA:  MS exacerbation with worsening low back pain, R leg pain and bilaterally leg weakness (worse on R than L) EXAM: MRI HEAD WITHOUT AND WITH CONTRAST MRI CERVICAL THORACIC LUMBAR WITHOUT AND CONTRAST CONTRAST:  4.6mL GADAVIST  GADOBUTROL  1 MMOL/ML IV SOLN TECHNIQUE: Multiplanar, multiecho pulse sequences of the brain and surrounding structures, and cervical, thoracic, and lumbar spine were obtained without and with intravenous contrast. COMPARISON:  MRI head and MRI cervical and thoracic spine 07/07/2023. FINDINGS: MRI HEAD FINDINGS Brain: Unchanged T2/FLAIR hyperintensities in the white matter, many of which are periventricular and perpendicular to the long axis lateral ventricles. No new lesions or enhancing lesions identified. No evidence of acute  infarct, acute hemorrhage, mass lesion, midline shift, or hydrocephalus. Vascular: Major arterial flow voids or maintained at the skull base. Skull and upper cervical spine: Normal marrow signal. Sinuses/Orbits: Sinuses are clear.  No acute orbital findings.  The Other: Moderate left mastoid effusion. MRI CERVICAL SPINE FINDINGS Alignment: No substantial sagittal subluxation. Vertebrae: No fracture, evidence of discitis, or bone lesion. Cord: Similar short-segment T2/stir hyperintense lesions within the cord at C3-C4 and C5-C6. No convincing new lesions or abnormal enhancement. Posterior Fossa, vertebral arteries, paraspinal tissues: Unremarkable. Disc levels: Multilevel facet and uncovertebral hypertrophy without significant canal or foraminal stenosis. MRI THORACIC SPINE FINDINGS Alignment:  No substantial sagittal subluxation. Vertebrae: No fracture, evidence of discitis, or bone lesion. Cord: Short-segment T2 hyperintensities in the cord at T5 and T6-T7 and T8-T9 are not substantially changed. No evidence of new cord lesion or abnormal enhancement. Paraspinal and other soft tissues: Unremarkable. Disc levels: No significant canal or foraminal stenosis. MRI LUMBAR SPINE FINDINGS Alignment:  No substantial sagittal subluxation. Vertebrae: No fracture, evidence of discitis, or bone lesion. Cord:  Normal appearance of the conus.  No abnormal enhancement. Paraspinal and other soft tissues: No acute abnormality. Disc levels: Mild degenerative changes without significant stenosis. IMPRESSION: 1. Similar chronic demyelinating lesions intracranially and in the spinal cord as detailed above. No enhancing lesions to suggest active demyelination. 2. No significant canal or foraminal stenosis. Electronically Signed   By: Gilmore GORMAN Molt M.D.   On: 09/07/2023 21:20   MR Cervical Spine W and Wo Contrast Result Date: 09/07/2023 CLINICAL DATA:  MS exacerbation with worsening low back pain, R leg pain and bilaterally leg  weakness (worse on R than L) EXAM: MRI HEAD WITHOUT AND WITH CONTRAST MRI CERVICAL THORACIC LUMBAR WITHOUT AND CONTRAST CONTRAST:  4.6mL GADAVIST  GADOBUTROL  1 MMOL/ML IV SOLN TECHNIQUE: Multiplanar, multiecho pulse sequences of the brain and surrounding structures, and cervical, thoracic, and lumbar spine were obtained without and with intravenous contrast. COMPARISON:  MRI head and MRI cervical and thoracic spine 07/07/2023. FINDINGS: MRI HEAD FINDINGS Brain: Unchanged T2/FLAIR hyperintensities in the white matter, many of which are periventricular and perpendicular to the long axis lateral ventricles. No new lesions or enhancing lesions identified. No evidence of acute infarct, acute hemorrhage, mass lesion, midline shift,  or hydrocephalus. Vascular: Major arterial flow voids or maintained at the skull base. Skull and upper cervical spine: Normal marrow signal. Sinuses/Orbits: Sinuses are clear.  No acute orbital findings.  The Other: Moderate left mastoid effusion. MRI CERVICAL SPINE FINDINGS Alignment: No substantial sagittal subluxation. Vertebrae: No fracture, evidence of discitis, or bone lesion. Cord: Similar short-segment T2/stir hyperintense lesions within the cord at C3-C4 and C5-C6. No convincing new lesions or abnormal enhancement. Posterior Fossa, vertebral arteries, paraspinal tissues: Unremarkable. Disc levels: Multilevel facet and uncovertebral hypertrophy without significant canal or foraminal stenosis. MRI THORACIC SPINE FINDINGS Alignment:  No substantial sagittal subluxation. Vertebrae: No fracture, evidence of discitis, or bone lesion. Cord: Short-segment T2 hyperintensities in the cord at T5 and T6-T7 and T8-T9 are not substantially changed. No evidence of new cord lesion or abnormal enhancement. Paraspinal and other soft tissues: Unremarkable. Disc levels: No significant canal or foraminal stenosis. MRI LUMBAR SPINE FINDINGS Alignment:  No substantial sagittal subluxation. Vertebrae: No  fracture, evidence of discitis, or bone lesion. Cord:  Normal appearance of the conus.  No abnormal enhancement. Paraspinal and other soft tissues: No acute abnormality. Disc levels: Mild degenerative changes without significant stenosis. IMPRESSION: 1. Similar chronic demyelinating lesions intracranially and in the spinal cord as detailed above. No enhancing lesions to suggest active demyelination. 2. No significant canal or foraminal stenosis. Electronically Signed   By: Gilmore GORMAN Molt M.D.   On: 09/07/2023 21:20   MR THORACIC SPINE W WO CONTRAST Result Date: 09/07/2023 CLINICAL DATA:  MS exacerbation with worsening low back pain, R leg pain and bilaterally leg weakness (worse on R than L) EXAM: MRI HEAD WITHOUT AND WITH CONTRAST MRI CERVICAL THORACIC LUMBAR WITHOUT AND CONTRAST CONTRAST:  4.6mL GADAVIST  GADOBUTROL  1 MMOL/ML IV SOLN TECHNIQUE: Multiplanar, multiecho pulse sequences of the brain and surrounding structures, and cervical, thoracic, and lumbar spine were obtained without and with intravenous contrast. COMPARISON:  MRI head and MRI cervical and thoracic spine 07/07/2023. FINDINGS: MRI HEAD FINDINGS Brain: Unchanged T2/FLAIR hyperintensities in the white matter, many of which are periventricular and perpendicular to the long axis lateral ventricles. No new lesions or enhancing lesions identified. No evidence of acute infarct, acute hemorrhage, mass lesion, midline shift, or hydrocephalus. Vascular: Major arterial flow voids or maintained at the skull base. Skull and upper cervical spine: Normal marrow signal. Sinuses/Orbits: Sinuses are clear.  No acute orbital findings.  The Other: Moderate left mastoid effusion. MRI CERVICAL SPINE FINDINGS Alignment: No substantial sagittal subluxation. Vertebrae: No fracture, evidence of discitis, or bone lesion. Cord: Similar short-segment T2/stir hyperintense lesions within the cord at C3-C4 and C5-C6. No convincing new lesions or abnormal enhancement.  Posterior Fossa, vertebral arteries, paraspinal tissues: Unremarkable. Disc levels: Multilevel facet and uncovertebral hypertrophy without significant canal or foraminal stenosis. MRI THORACIC SPINE FINDINGS Alignment:  No substantial sagittal subluxation. Vertebrae: No fracture, evidence of discitis, or bone lesion. Cord: Short-segment T2 hyperintensities in the cord at T5 and T6-T7 and T8-T9 are not substantially changed. No evidence of new cord lesion or abnormal enhancement. Paraspinal and other soft tissues: Unremarkable. Disc levels: No significant canal or foraminal stenosis. MRI LUMBAR SPINE FINDINGS Alignment:  No substantial sagittal subluxation. Vertebrae: No fracture, evidence of discitis, or bone lesion. Cord:  Normal appearance of the conus.  No abnormal enhancement. Paraspinal and other soft tissues: No acute abnormality. Disc levels: Mild degenerative changes without significant stenosis. IMPRESSION: 1. Similar chronic demyelinating lesions intracranially and in the spinal cord as detailed above. No enhancing lesions to suggest active  demyelination. 2. No significant canal or foraminal stenosis. Electronically Signed   By: Gilmore GORMAN Molt M.D.   On: 09/07/2023 21:20   MR Lumbar Spine W Wo Contrast Result Date: 09/07/2023 CLINICAL DATA:  MS exacerbation with worsening low back pain, R leg pain and bilaterally leg weakness (worse on R than L) EXAM: MRI HEAD WITHOUT AND WITH CONTRAST MRI CERVICAL THORACIC LUMBAR WITHOUT AND CONTRAST CONTRAST:  4.6mL GADAVIST  GADOBUTROL  1 MMOL/ML IV SOLN TECHNIQUE: Multiplanar, multiecho pulse sequences of the brain and surrounding structures, and cervical, thoracic, and lumbar spine were obtained without and with intravenous contrast. COMPARISON:  MRI head and MRI cervical and thoracic spine 07/07/2023. FINDINGS: MRI HEAD FINDINGS Brain: Unchanged T2/FLAIR hyperintensities in the white matter, many of which are periventricular and perpendicular to the long axis  lateral ventricles. No new lesions or enhancing lesions identified. No evidence of acute infarct, acute hemorrhage, mass lesion, midline shift, or hydrocephalus. Vascular: Major arterial flow voids or maintained at the skull base. Skull and upper cervical spine: Normal marrow signal. Sinuses/Orbits: Sinuses are clear.  No acute orbital findings.  The Other: Moderate left mastoid effusion. MRI CERVICAL SPINE FINDINGS Alignment: No substantial sagittal subluxation. Vertebrae: No fracture, evidence of discitis, or bone lesion. Cord: Similar short-segment T2/stir hyperintense lesions within the cord at C3-C4 and C5-C6. No convincing new lesions or abnormal enhancement. Posterior Fossa, vertebral arteries, paraspinal tissues: Unremarkable. Disc levels: Multilevel facet and uncovertebral hypertrophy without significant canal or foraminal stenosis. MRI THORACIC SPINE FINDINGS Alignment:  No substantial sagittal subluxation. Vertebrae: No fracture, evidence of discitis, or bone lesion. Cord: Short-segment T2 hyperintensities in the cord at T5 and T6-T7 and T8-T9 are not substantially changed. No evidence of new cord lesion or abnormal enhancement. Paraspinal and other soft tissues: Unremarkable. Disc levels: No significant canal or foraminal stenosis. MRI LUMBAR SPINE FINDINGS Alignment:  No substantial sagittal subluxation. Vertebrae: No fracture, evidence of discitis, or bone lesion. Cord:  Normal appearance of the conus.  No abnormal enhancement. Paraspinal and other soft tissues: No acute abnormality. Disc levels: Mild degenerative changes without significant stenosis. IMPRESSION: 1. Similar chronic demyelinating lesions intracranially and in the spinal cord as detailed above. No enhancing lesions to suggest active demyelination. 2. No significant canal or foraminal stenosis. Electronically Signed   By: Gilmore GORMAN Molt M.D.   On: 09/07/2023 21:20    Procedures   Medications Ordered in the ED  methylPREDNISolone   sodium succinate (SOLU-MEDROL ) 1,000 mg in sodium chloride  0.9 % 50 mL IVPB (1,000 mg Intravenous New Bag/Given 09/07/23 2340)  midazolam  (VERSED ) injection 2 mg (2 mg Intravenous Given 09/07/23 1633)  oxyCODONE -acetaminophen  (PERCOCET/ROXICET) 5-325 MG per tablet 1 tablet (1 tablet Oral Given 09/07/23 1633)  gadobutrol  (GADAVIST ) 1 MMOL/ML injection 4.6 mL (4.6 mLs Intravenous Contrast Given 09/07/23 1816)  fentaNYL  (SUBLIMAZE ) injection 25 mcg (25 mcg Intravenous Given 09/07/23 1929)  fentaNYL  (SUBLIMAZE ) injection 25 mcg (25 mcg Intravenous Given 09/07/23 2105)  nitrofurantoin  (macrocrystal-monohydrate) (MACROBID ) capsule 100 mg (100 mg Oral Given 09/07/23 2344)    Clinical Course as of 09/07/23 2352  Sat Sep 07, 2023  2249 Spoke with Dr. Nichola who recommended that if the UA is clean, this patient could be given 1g dose of solumedrol and sent home, with neurology outpatient follow-up. [CB]    Clinical Course User Index [CB] Beola Terrall GORMAN, PA-C  Medical Decision Making Amount and/or Complexity of Data Reviewed Labs: ordered. Radiology: ordered.  Risk Prescription drug management.   This patient is a 58 year old female who presents to the ED for concern of low back pain, bilateral lower extremity weakness ongoing and progressive x 3 weeks.  Noted that she also has not been eating or drinking normally due to her lack of appetite.  On physical exam, patient is in no acute distress, afebrile, alert and orient x 4, speaking in full sentences, nontachypneic, nontachycardic.  Notably does have bilateral lower extremity weakness, worse on right than left, noted to have right sided 3 out of 5 strength to dorsi flexion, knee extension/flexion, hip flexion/extension.  With noting 4 out of 5 strength to left side for same movements.  Noted to have bilateral upper and lower extremity tremors, which she says is baseline for her.  Upper extremity strength 5 out of 5 to both elbow  extension/flexion, normal grip strength.  Exam otherwise unremarkable.  Medications current presentation, suspecting likely MS flare, will obtain MRIs of brain, cervical spine, thoracic spine, lumbar spine.  Lumbar spine added due to her worsening right-sided weakness when compared to left that is acute and worsened her normal.  Also will obtain basic labs as she was noted to have metabolic disturbance which neurology has suspect is the cause of her symptoms during previous admission.  Lab work reveals a mildly elevated hemoglobin of 15.3 as well as elevated alk phos of 390, mild hypokalemia 3.2 without any other abnormalities.  Provided Percocet initially, and on reevaluation said that this did not help, provided fentanyl  which she said she was provided in hospital last time which relieved pain.  Patient was ambulated and was able to walk however was noted to be unsteady when walking which is her baseline.  MRIs were negative for any acute findings and did not show any signs of active demyelinating disease.  Spoke to Dr. Nichola with neurology who recommended 1 g of Solu-Medrol  and being sent home if urinalysis is unremarkable.  Will have her follow-up with neurology outpatient.  Urine was positive for UTI, culture sent, however with no other significant lab abnormalities, provide antibiotics here and sent home antibiotics as well as short course of pain control per patient request as this is what helped in hospital.  Patient vital signs have remained stable throughout the course of patient's time in the ED. Low suspicion for any other emergent pathology at this time. I believe this patient is safe to be discharged. Provided strict return to ER precautions. Patient expressed agreement and understanding of plan. All questions were answered.  Differential diagnoses prior to evaluation: The emergent differential diagnosis includes, but is not limited to, MS, CVA, fracture (acute/chronic), muscle strain,  cauda equina, spinal stenosis, DDD, metastatic cancer, vertebral osteomyelitis, kidney stone, pyelonephritis, AAA, pancreatitis, bowel obstruction, meningitis.  . This is not an exhaustive differential.   Past Medical History / Co-morbidities / Social History: Weakness, MS, HTN, shingles, hepatic fibrosis, chronic low back pain, polycythemia, hemochromatosis, nicotine  use  Additional history: Chart reviewed. Pertinent results include:   Admitted on 07/07/2023 for MS noting bilateral lower extremity weakness at that time, similar to today with having MRIs of T-spine showing stable lesions at T5, T6-T7 as well as T8-T9 with demyelinating disease.  Per neurology, likely believing this to be a pseudo exacerbation due having electrolyte abnormalities.  Also noted to have bilateral lower extremity pain.  With history of neuropathy and chronic pain.  Last seen in  office on 01/22/2022 by Dr. Onita.  Noted to have gait abnormality with relapsing remitting multiple sclerosis.    Lab Tests/Imaging studies: I personally interpreted labs/imaging and the pertinent results include:    CBC notes an elevated hemoglobin 15.3 CMP notes a mild hypokalemia of 3.2 and elevated alk phos at 390 Magnesium  unremarkable Phosphorus unremarkable. MRI of brain, cervical spine, thoracic spine, lumbar spine shows no new or active demyelinating disease.  I agree with the radiologist interpretation.   Medications: I ordered medication including fentanyl , oxycodone , Versed .  I have reviewed the patients home medicines and have made adjustments as needed.  Critical Interventions: None  Social Determinants of Health: Has difficulty getting to appointments secondary to inability to drive  Disposition: After consideration of the diagnostic results and the patients response to treatment, I feel that the patient would benefit from discharge and treatment as above.   emergency department workup does not suggest an emergent  condition requiring admission or immediate intervention beyond what has been performed at this time. The plan is: Follow-up with neurology. The patient is safe for discharge and has been instructed to return immediately for worsening symptoms, change in symptoms or any other concerns.   Final diagnoses:  Weakness of both lower extremities  Acute cystitis without hematuria    ED Discharge Orders          Ordered    nitrofurantoin , macrocrystal-monohydrate, (MACROBID ) 100 MG capsule  2 times daily        09/07/23 2349    oxyCODONE -acetaminophen  (PERCOCET/ROXICET) 5-325 MG tablet  Every 6 hours PRN        09/07/23 2352               Beola Terrall RAMAN, [place] 09/07/23 2353    Garrick Charleston, MD 09/09/23 2245

## 2023-09-07 NOTE — ED Notes (Signed)
 Pt provided a ham sandwich and water - ok'd by EDP.

## 2023-09-07 NOTE — Discharge Instructions (Addendum)
 You are seen today for lower extremity weakness.  Your MRIs did not show any worsening disease at this time.  Your lab work did note a UTI, with which you were given the first dose of antibiotic today with the rest and to your pharmacy, please complete full course.  Spoke to neurology today who recommended providing you with steroids today but otherwise we will have you follow-up in the outpatient setting.  Return to ED though if you have any new or worsening symptoms.

## 2023-09-08 NOTE — ED Notes (Signed)
 [facility] contacted about providing the patient transport home. Will send a truck out ASAP.

## 2023-09-08 NOTE — ED Notes (Signed)
 Reviewed D/C information with the patient, pt verbalized understanding. No additional concerns at this time. Pts facesheet and med necessity provided to transport team. VS updated.

## 2023-09-09 LAB — URINE CULTURE

## 2023-10-02 LAB — COLOGUARD: COLOGUARD: POSITIVE — AB

## 2023-11-26 ENCOUNTER — Encounter (HOSPITAL_COMMUNITY): Payer: Self-pay

## 2023-11-26 ENCOUNTER — Other Ambulatory Visit: Payer: Self-pay

## 2023-11-26 ENCOUNTER — Emergency Department (HOSPITAL_COMMUNITY)

## 2023-11-26 ENCOUNTER — Emergency Department (HOSPITAL_COMMUNITY)
Admission: EM | Admit: 2023-11-26 | Discharge: 2023-11-26 | Disposition: A | Discharge status: Home / Self Care | Attending: Emergency Medicine | Admitting: Emergency Medicine

## 2023-11-26 DIAGNOSIS — G35D Multiple sclerosis, unspecified: Secondary | ICD-10-CM | POA: Insufficient documentation

## 2023-11-26 DIAGNOSIS — W1830XA Fall on same level, unspecified, initial encounter: Secondary | ICD-10-CM | POA: Insufficient documentation

## 2023-11-26 DIAGNOSIS — I700 Atherosclerosis of aorta: Secondary | ICD-10-CM | POA: Insufficient documentation

## 2023-11-26 DIAGNOSIS — M79651 Pain in right thigh: Secondary | ICD-10-CM | POA: Insufficient documentation

## 2023-11-26 DIAGNOSIS — R531 Weakness: Secondary | ICD-10-CM | POA: Insufficient documentation

## 2023-11-26 DIAGNOSIS — W19XXXA Unspecified fall, initial encounter: Secondary | ICD-10-CM

## 2023-11-26 DIAGNOSIS — R109 Unspecified abdominal pain: Secondary | ICD-10-CM | POA: Insufficient documentation

## 2023-11-26 DIAGNOSIS — M79652 Pain in left thigh: Secondary | ICD-10-CM | POA: Insufficient documentation

## 2023-11-26 DIAGNOSIS — M545 Low back pain, unspecified: Secondary | ICD-10-CM | POA: Insufficient documentation

## 2023-11-26 LAB — CBC WITH DIFFERENTIAL/PLATELET
Abs Immature Granulocytes: 0.08 K/uL — ABNORMAL HIGH (ref 0.00–0.07)
Basophils Absolute: 0.1 K/uL (ref 0.0–0.1)
Basophils Relative: 1 %
Eosinophils Absolute: 0 K/uL (ref 0.0–0.5)
Eosinophils Relative: 0 %
HCT: 45 % (ref 36.0–46.0)
Hemoglobin: 14.7 g/dL (ref 12.0–15.0)
Immature Granulocytes: 1 %
Lymphocytes Relative: 26 %
Lymphs Abs: 2.3 K/uL (ref 0.7–4.0)
MCH: 31.2 pg (ref 26.0–34.0)
MCHC: 32.7 g/dL (ref 30.0–36.0)
MCV: 95.5 fL (ref 80.0–100.0)
Monocytes Absolute: 0.4 K/uL (ref 0.1–1.0)
Monocytes Relative: 5 %
Neutro Abs: 6.2 K/uL (ref 1.7–7.7)
Neutrophils Relative %: 67 %
Platelets: 141 K/uL — ABNORMAL LOW (ref 150–400)
RBC: 4.71 MIL/uL (ref 3.87–5.11)
RDW: 12.9 % (ref 11.5–15.5)
WBC: 9.1 K/uL (ref 4.0–10.5)
nRBC: 0 % (ref 0.0–0.2)

## 2023-11-26 LAB — CK: Total CK: 40 U/L (ref 38–234)

## 2023-11-26 LAB — URINALYSIS, ROUTINE W REFLEX MICROSCOPIC
Bacteria, UA: NONE SEEN
Bilirubin Urine: NEGATIVE
Glucose, UA: NEGATIVE mg/dL
Hgb urine dipstick: NEGATIVE
Ketones, ur: NEGATIVE mg/dL
Leukocytes,Ua: NEGATIVE
Nitrite: NEGATIVE
Protein, ur: 30 mg/dL — AB
Specific Gravity, Urine: 1.017 (ref 1.005–1.030)
pH: 5 (ref 5.0–8.0)

## 2023-11-26 LAB — COMPREHENSIVE METABOLIC PANEL WITH GFR
ALT: 43 U/L (ref 0–44)
AST: 27 U/L (ref 15–41)
Albumin: 4 g/dL (ref 3.5–5.0)
Alkaline Phosphatase: 538 U/L — ABNORMAL HIGH (ref 38–126)
Anion gap: 9 (ref 5–15)
BUN: 10 mg/dL (ref 6–20)
CO2: 29 mmol/L (ref 22–32)
Calcium: 9.4 mg/dL (ref 8.9–10.3)
Chloride: 106 mmol/L (ref 98–111)
Creatinine, Ser: 0.69 mg/dL (ref 0.44–1.00)
GFR, Estimated: >60 mL/min (ref >60)
Glucose, Bld: 97 mg/dL (ref 70–99)
Potassium: 3.8 mmol/L (ref 3.5–5.1)
Sodium: 144 mmol/L (ref 135–145)
Total Bilirubin: 0.2 mg/dL (ref 0.0–1.2)
Total Protein: 7.2 g/dL (ref 6.5–8.1)

## 2023-11-26 LAB — MAGNESIUM: Magnesium: 2 mg/dL (ref 1.7–2.4)

## 2023-11-26 MED ORDER — ONDANSETRON HCL 4 MG/2ML IJ SOLN
4.0000 mg | Freq: Once | INTRAMUSCULAR | Status: AC
  Administered 2023-11-26: 4 mg via INTRAVENOUS
  Filled 2023-11-26: qty 2

## 2023-11-26 MED ORDER — FENTANYL CITRATE (PF) 50 MCG/ML IJ SOSY
50.0000 ug | PREFILLED_SYRINGE | Freq: Once | INTRAMUSCULAR | Status: AC
  Administered 2023-11-26: 50 ug via INTRAVENOUS
  Filled 2023-11-26: qty 1

## 2023-11-26 MED ORDER — METHOCARBAMOL 500 MG PO TABS
500.0000 mg | ORAL_TABLET | Freq: Once | ORAL | Status: AC
  Administered 2023-11-26: 500 mg via ORAL
  Filled 2023-11-26: qty 1

## 2023-11-26 NOTE — ED Provider Notes (Signed)
 [place] EMERGENCY DEPARTMENT AT [facility] Provider Note   CSN: [id number] Arrival date & time: 11/26/23  1156     Patient presents with: Weakness, Leg Pain, and Back Pain   Gabriela Campbell is a 58 y.o. female with a history of MS, who complains of back pain and leg pain after fall at home 5 days ago.  Patient states that she feels the pain bilaterally in her upper thighs and her lower back.  Patient stated that she fell onto hard floor from a standing position, onto her back, without hitting her head or LOC. Patient states that the pain has been worsening since the fall. Patient states that she has been having worsening weakness for the past few months and just recently started falling, possibly due to her MS. Patient in no acute distress.    Weakness Leg Pain Associated symptoms: back pain   Back Pain Associated symptoms: leg pain and weakness        Prior to Admission medications   Medication Sig Start Date End Date Taking? Authorizing Provider  acetaminophen  (TYLENOL ) 325 MG tablet Take 2 tablets (650 mg total) by mouth every 6 (six) hours as needed for mild pain (pain score 1-3) (or Fever >/= 101). 07/10/23   Sheikh, Omair Latif, DO  amLODipine  (NORVASC ) 5 MG tablet Take 0.5 tablets (2.5 mg total) by mouth daily. Patient taking differently: Take 5 mg by mouth in the morning. 02/14/23   Laurence Locus, DO  Cholecalciferol  (VITAMIN D3) 25 MCG (1000 UT) CAPS Take 2,000 Units by mouth daily.    [provider]  Cyanocobalamin  (VITAMIN B12 PO) Take 1 tablet by mouth daily.    [provider]  methocarbamol  (ROBAXIN ) 500 MG tablet Take 1 tablet (500 mg total) by mouth every 8 (eight) hours as needed for muscle spasms. Patient not taking: Reported on 07/07/2023 06/01/23   Desiderio Chew, PA-C  nitrofurantoin , macrocrystal-monohydrate, (MACROBID ) 100 MG capsule Take 1 capsule (100 mg total) by mouth 2 (two) times daily. 09/07/23   Bauer, Collin S, PA-C   ondansetron  (ZOFRAN ) 4 MG tablet Take 1 tablet (4 mg total) by mouth every 6 (six) hours as needed for nausea. 07/10/23   Sherrill Cable Latif, DO  oxyCODONE  (OXY IR/ROXICODONE ) 5 MG immediate release tablet Take 1 tablet (5 mg total) by mouth every 6 (six) hours as needed for up to 3 days for moderate pain (pain score 4-6). 07/10/23 07/13/23  Sherrill Cable Latif, DO  oxyCODONE -acetaminophen  (PERCOCET/ROXICET) 5-325 MG tablet Take 1 tablet by mouth every 6 (six) hours as needed for severe pain (pain score 7-10). 09/07/23   Bauer, Collin S, PA-C  polyethylene glycol (MIRALAX  / GLYCOLAX ) 17 g packet Take 17 g by mouth daily as needed for mild constipation. 07/10/23   Sherrill Cable Latif, DO  pregabalin  (LYRICA ) 25 MG capsule Take 1 capsule (25 mg total) by mouth 3 (three) times daily. Patient not taking: Reported on 07/07/2023 05/05/23 05/04/24  Jonel Lonni SQUIBB, MD  senna (SENOKOT) 8.6 MG TABS tablet Take 1 tablet (8.6 mg total) by mouth 2 (two) times daily. 07/10/23   Sherrill Cable Latif, DO  ursodiol  (ACTIGALL ) 300 MG capsule Take 600 mg by mouth in the morning. 02/27/23 03/05/24  [provider]    Allergies: Dilaudid [hydromorphone hcl], Fish allergy, Fish protein-containing drug products, Ibuprofen, Iodine, Penicillins, Shellfish allergy, Shrimp extract, Tramadol , Baclofen , Gabapentin , and Codeine    Review of Systems  Musculoskeletal:  Positive for back pain.  Neurological:  Positive  for weakness.    Updated Vital Signs BP 135/82   Pulse 64   Temp 99.2 F (37.3 C) (Oral)   Resp 18   SpO2 98%   Physical Exam Vitals and nursing note reviewed.  Constitutional:      General: She is not in acute distress.    Appearance: Normal appearance.  HENT:     Head: Normocephalic and atraumatic.  Eyes:     Extraocular Movements: Extraocular movements intact.     Conjunctiva/sclera: Conjunctivae normal.     Pupils: Pupils are equal, round, and reactive to light.  Cardiovascular:     Rate and  Rhythm: Normal rate and regular rhythm.     Pulses: Normal pulses.  Pulmonary:     Effort: Pulmonary effort is normal. No respiratory distress.  Abdominal:     General: Abdomen is flat.     Palpations: Abdomen is soft.     Tenderness: There is no abdominal tenderness.  Musculoskeletal:        General: Tenderness present. No swelling, deformity or signs of injury. Normal range of motion.     Cervical back: Normal range of motion.     Right lower leg: No edema.     Left lower leg: No edema.     Comments: SI joints, and proximal lower extremities were TTP bilaterally.   Skin:    General: Skin is warm and dry.     Capillary Refill: Capillary refill takes less than 2 seconds.  Neurological:     General: No focal deficit present.     Mental Status: She is alert. Mental status is at baseline.     Cranial Nerves: Cranial nerves 2-12 are intact.     Motor: Motor function is intact.     Coordination: Coordination is intact.     Gait: Gait is intact.  Psychiatric:        Mood and Affect: Mood normal.     (all labs ordered are listed, but only abnormal results are displayed) Labs Reviewed  COMPREHENSIVE METABOLIC PANEL WITH GFR - Abnormal; Notable for the following components:      Result Value   Alkaline Phosphatase 538 (*)    All other components within normal limits  CBC WITH DIFFERENTIAL/PLATELET - Abnormal; Notable for the following components:   Platelets 141 (*)    Abs Immature Granulocytes 0.08 (*)    All other components within normal limits  URINALYSIS, ROUTINE W REFLEX MICROSCOPIC - Abnormal; Notable for the following components:   Protein, ur 30 (*)    All other components within normal limits  MAGNESIUM   CK    EKG: None  Radiology: No results found.   Procedures   Medications Ordered in the ED  ondansetron  (ZOFRAN ) injection 4 mg (4 mg Intravenous Given 11/26/23 1309)  fentaNYL  (SUBLIMAZE ) injection 50 mcg (50 mcg Intravenous Given 11/26/23 1309)   methocarbamol  (ROBAXIN ) tablet 500 mg (500 mg Oral Given by Other 11/26/23 1721)                                    Medical Decision Making Amount and/or Complexity of Data Reviewed Labs: ordered. Radiology: ordered.  Risk Prescription drug management.   Patient presents to the ED for concern of back pain and weakness, this involves an extensive number of treatment options, and is a complaint that carries with it a high risk of complications and morbidity.    The differential  diagnosis includes: MSK etiology Traumatic etiology - fracture/dislocation Metabolic etiology Complications with chronic condition -MS  Co morbidities that complicate the patient evaluation: Muscular sclerosis  Additional history obtained: Additional history obtained from  Outside Medical Records  External records from outside source obtained and reviewed including prior medical records regarding patient's MS and chronic laboratory findings.  Lab Tests: I ordered, and personally interpreted labs.   The pertinent results include:  Alk phos: 538  Imaging Studies ordered: I ordered imaging studies including: CT abdomen pelvis without contrast CT L-spine no charge I independently visualized and interpreted imaging which showed: No acute findings I agree with the radiologist interpretation  Medicines ordered and prescription drug management: I ordered medications: Ondansetron  4 mg Fentanyl  50 mcg Methocarbamol  500 mg   I have reviewed the patients home medicines and have made adjustments as needed  Problem List / ED Course: Problem List: Weakness Lower extremity pain Emergency Department Course: The patient presented with multiple complaints. Initial assessment included history, physical exam, and review of prior medical records.  On exam, she had tenderness over both SI joints and bilateral proximal lower extremities, but no deformity, step-offs, focal neurologic deficits.  Strength and  sensation were grossly intact and there were no findings concerning for cauda equina.  Laboratory studies were obtained, notable for an alk phos of 538, consistent with her chronic baseline.  No acute lab abnormalities were identified.  Given persistent pain, imaging was obtained. CT imaging showed no acute fractures, dislocations or intra-abdominal pathology.  For symptom management she received ondansetron , fentanyl , methocarbamol , with reported improvement and no adverse effects.  Her exam remained stable and she ambulated without significant difficulty.  Given reassuring imaging, stable vital signs, and improvement with medication, she was considered appropriate for outpatient management.    Disposition: She was discharged with supportive care measures, return precautions, and instructions to follow-up with her primary care provider for ongoing pain management or worsening symptoms.     Final diagnoses:  Weakness  Fall, initial encounter    ED Discharge Orders     None          Willma Duwaine CROME, [place] 12/04/23 1801    Armenta Canning, MD 12/07/23 1429

## 2023-11-26 NOTE — Discharge Instructions (Addendum)
 Thank you for visiting the Emergency Department today. It was a pleasure to be part of your healthcare team. You were seen today for back and leg pain after a fall.  As discussed, at home, rest, hydrate, and resume normal diet.  Please follow-up with your provider for evaluation of your MS symptoms and increased weakness. It is important to watch for warning signs such as worsening pain, numbness, tingling, or difficulty urinating. If any of these happen, return to the Emergency Department or call 911. Thank you for trusting us  with your health.

## 2023-11-26 NOTE — ED Triage Notes (Signed)
 Pt BIB EMS from Home due to weakness in leg, pain bilaterally in thighs, and right low back pain worsening the last 3 days. Pt reports chronic leg pain. Pt fell 5 days ago onset low back did not seek treatment.  BP 138/64 HR 82 SpO2 98% CBG 133

## 2024-01-04 ENCOUNTER — Other Ambulatory Visit: Payer: Self-pay

## 2024-01-04 ENCOUNTER — Emergency Department (HOSPITAL_COMMUNITY)

## 2024-01-04 ENCOUNTER — Emergency Department (HOSPITAL_COMMUNITY)
Admission: EM | Admit: 2024-01-04 | Discharge: 2024-01-05 | Disposition: A | Attending: Emergency Medicine | Admitting: Emergency Medicine

## 2024-01-04 ENCOUNTER — Encounter (HOSPITAL_COMMUNITY): Payer: Self-pay | Admitting: *Deleted

## 2024-01-04 DIAGNOSIS — M25561 Pain in right knee: Secondary | ICD-10-CM | POA: Diagnosis not present

## 2024-01-04 DIAGNOSIS — G35D Multiple sclerosis, unspecified: Secondary | ICD-10-CM | POA: Insufficient documentation

## 2024-01-04 DIAGNOSIS — N390 Urinary tract infection, site not specified: Secondary | ICD-10-CM | POA: Diagnosis not present

## 2024-01-04 DIAGNOSIS — R7401 Elevation of levels of liver transaminase levels: Secondary | ICD-10-CM | POA: Diagnosis not present

## 2024-01-04 DIAGNOSIS — R531 Weakness: Secondary | ICD-10-CM | POA: Diagnosis present

## 2024-01-04 DIAGNOSIS — Z79899 Other long term (current) drug therapy: Secondary | ICD-10-CM | POA: Diagnosis not present

## 2024-01-04 DIAGNOSIS — R748 Abnormal levels of other serum enzymes: Secondary | ICD-10-CM | POA: Insufficient documentation

## 2024-01-04 DIAGNOSIS — I10 Essential (primary) hypertension: Secondary | ICD-10-CM | POA: Insufficient documentation

## 2024-01-04 LAB — URINALYSIS, ROUTINE W REFLEX MICROSCOPIC
Bilirubin Urine: NEGATIVE
Glucose, UA: NEGATIVE mg/dL
Hgb urine dipstick: NEGATIVE
Ketones, ur: NEGATIVE mg/dL
Nitrite: NEGATIVE
Protein, ur: 100 mg/dL — AB
Specific Gravity, Urine: 1.018 (ref 1.005–1.030)
pH: 7 (ref 5.0–8.0)

## 2024-01-04 LAB — COMPREHENSIVE METABOLIC PANEL WITH GFR
ALT: 69 U/L — ABNORMAL HIGH (ref 0–44)
AST: 38 U/L (ref 15–41)
Albumin: 4 g/dL (ref 3.5–5.0)
Alkaline Phosphatase: 653 U/L — ABNORMAL HIGH (ref 38–126)
Anion gap: 13 (ref 5–15)
BUN: 10 mg/dL (ref 6–20)
CO2: 25 mmol/L (ref 22–32)
Calcium: 9.3 mg/dL (ref 8.9–10.3)
Chloride: 104 mmol/L (ref 98–111)
Creatinine, Ser: 0.68 mg/dL (ref 0.44–1.00)
GFR, Estimated: >60 mL/min
Glucose, Bld: 96 mg/dL (ref 70–99)
Potassium: 4 mmol/L (ref 3.5–5.1)
Sodium: 141 mmol/L (ref 135–145)
Total Bilirubin: 0.4 mg/dL (ref 0.0–1.2)
Total Protein: 7.3 g/dL (ref 6.5–8.1)

## 2024-01-04 LAB — CBC WITH DIFFERENTIAL/PLATELET
Abs Immature Granulocytes: 0.04 K/uL (ref 0.00–0.07)
Basophils Absolute: 0 K/uL (ref 0.0–0.1)
Basophils Relative: 0 %
Eosinophils Absolute: 0 K/uL (ref 0.0–0.5)
Eosinophils Relative: 0 %
HCT: 44.9 % (ref 36.0–46.0)
Hemoglobin: 15.1 g/dL — ABNORMAL HIGH (ref 12.0–15.0)
Immature Granulocytes: 0 %
Lymphocytes Relative: 25 %
Lymphs Abs: 2.4 K/uL (ref 0.7–4.0)
MCH: 31.5 pg (ref 26.0–34.0)
MCHC: 33.6 g/dL (ref 30.0–36.0)
MCV: 93.5 fL (ref 80.0–100.0)
Monocytes Absolute: 0.5 K/uL (ref 0.1–1.0)
Monocytes Relative: 5 %
Neutro Abs: 6.6 K/uL (ref 1.7–7.7)
Neutrophils Relative %: 70 %
Platelets: 135 K/uL — ABNORMAL LOW (ref 150–400)
RBC: 4.8 MIL/uL (ref 3.87–5.11)
RDW: 13.8 % (ref 11.5–15.5)
WBC: 9.5 K/uL (ref 4.0–10.5)
nRBC: 0 % (ref 0.0–0.2)

## 2024-01-04 LAB — MAGNESIUM: Magnesium: 1.9 mg/dL (ref 1.7–2.4)

## 2024-01-04 MED ORDER — METHOCARBAMOL 500 MG PO TABS
500.0000 mg | ORAL_TABLET | Freq: Three times a day (TID) | ORAL | Status: DC | PRN
  Administered 2024-01-04 – 2024-01-05 (×2): 500 mg via ORAL
  Filled 2024-01-04 (×2): qty 1

## 2024-01-04 MED ORDER — ALUM & MAG HYDROXIDE-SIMETH 200-200-20 MG/5ML PO SUSP: 30.0000 mL | Freq: Four times a day (QID) | ORAL | Status: DC | PRN

## 2024-01-04 MED ORDER — OXYCODONE-ACETAMINOPHEN 5-325 MG PO TABS
1.0000 | ORAL_TABLET | Freq: Once | ORAL | Status: AC
  Administered 2024-01-04: 1 via ORAL
  Filled 2024-01-04: qty 1

## 2024-01-04 MED ORDER — CEPHALEXIN 500 MG PO CAPS: 500.0000 mg | ORAL_CAPSULE | Freq: Two times a day (BID) | ORAL | Status: DC

## 2024-01-04 MED ORDER — LORAZEPAM 1 MG PO TABS: 1.0000 mg | ORAL_TABLET | Freq: Once | ORAL | Status: DC

## 2024-01-04 MED ORDER — OXYCODONE HCL ER 10 MG PO T12A
10.0000 mg | EXTENDED_RELEASE_TABLET | Freq: Two times a day (BID) | ORAL | Status: DC
  Administered 2024-01-04: 10 mg via ORAL
  Filled 2024-01-04: qty 1

## 2024-01-04 MED ORDER — CEPHALEXIN 500 MG PO CAPS
500.0000 mg | ORAL_CAPSULE | Freq: Once | ORAL | Status: AC
  Administered 2024-01-04: 500 mg via ORAL
  Filled 2024-01-04: qty 1

## 2024-01-04 MED ORDER — LORAZEPAM 2 MG/ML IJ SOLN
1.0000 mg | Freq: Once | INTRAMUSCULAR | Status: AC
  Administered 2024-01-04: 1 mg via INTRAVENOUS
  Filled 2024-01-04: qty 1

## 2024-01-04 MED ORDER — NICOTINE 21 MG/24HR TD PT24
21.0000 mg | MEDICATED_PATCH | Freq: Every day | TRANSDERMAL | Status: DC
  Filled 2024-01-04: qty 1

## 2024-01-04 MED ORDER — MAGNESIUM SULFATE 2 GM/50ML IV SOLN
2.0000 g | Freq: Once | INTRAVENOUS | Status: AC
  Administered 2024-01-04: 2 g via INTRAVENOUS
  Filled 2024-01-04: qty 50

## 2024-01-04 MED ORDER — ONDANSETRON HCL 4 MG PO TABS: 4.0000 mg | ORAL_TABLET | Freq: Three times a day (TID) | ORAL | Status: DC | PRN

## 2024-01-04 MED ORDER — GADOBUTROL 1 MMOL/ML IV SOLN
4.0000 mL | Freq: Once | INTRAVENOUS | Status: AC | PRN
  Administered 2024-01-04: 4 mL via INTRAVENOUS

## 2024-01-04 NOTE — ED Notes (Signed)
 Pt requested to use restroom.  Pt escorted via wheelchair by NT to bathroom

## 2024-01-04 NOTE — Progress Notes (Signed)
 [facility] [phone] [facility] Liaison Note:    This is a current home-based patient with [facility]. We can provide next business day visits, including labs, diagnostics, medications, disease management, and ongoing monitoring to prevent an unnecessary hospital admission.    Please call with any questions or concerns. Liaison will continue to follow for discharge disposition.    Thank you, Nat Babe, BSN, [facility] Liaison [phone]

## 2024-01-04 NOTE — ED Triage Notes (Signed)
 BIB EMS Patient c/o bilateral leg weakness, increased over past 2 days H/o MS  Vs WNL w EMS  Patient says pain is 10/10 in legs

## 2024-01-04 NOTE — Care Management (Signed)
 Transition of Care [facility]) - Emergency Department Mini Assessment   Patient Details  Name: Gabriela Campbell MRN: [id number] Date of Birth: [birth year]  [facility] CM/SW Contact:    Corean JAYSON Canary, RN Phone Number: 01/04/2024, 4:38 PM   Clinical Narrative:  Patient presented with bilateral leg weakness History of MS. [facility] was called by Dr office to provide home health to patient, but they could not staff at present. Sent out in [place], [place] accepted. Orders will be entered, the patient will DC home. Information on AVS, No history in [facility] ED Mini Assessment:   Home with home health          Interventions which prevented an admission or readmission: Home Health Consult or Services    Patient Contact and Communications        ,                 Admission diagnosis:  Leg Weakness Patient Active Problem List   Diagnosis Date Noted   Multiple sclerosis 07/07/2023   Generalized weakness 05/04/2023   History of multiple sclerosis 04/08/2023   Tobacco dependence syndrome 04/08/2023   Tobacco use 04/08/2023   Hepatic fibrosis, advanced fibrosis 03/03/2023   Scoliosis deformity of spine 02/27/2023   Bilateral leg weakness 07/28/2022   Chronic low back pain 07/28/2022   Essential hypertension 07/28/2022   Hypokalemia 07/28/2022   Polycythemia 07/28/2022   left leg pain 06/29/2021   Hereditary hemochromatosis 07/29/2020   Elevated liver enzymes 05/31/2020   Vitamin D  deficiency 02/18/2018   Relapsing remitting multiple sclerosis 03/28/2017   Gait abnormality 03/14/2017   Smoker 10/10/2016   Leg weakness, bilateral 10/10/2016   PCP:  [facility]:   [id number] [place],  - [address] AT [facility] & [address] [place] [address] Phone: [phone] Fax: [phone]

## 2024-01-04 NOTE — ED Notes (Signed)
 Patient requested to use restroom.  Primary RN overheard NT speak with patient and told patient she would be returning shortly with wheelchair.  Primary RN proceeded to leave nurses' station and observed patient on floor.  Primary RN asked patient if she placed self on floor and patient stated Yes, I let down this thing and I just went on the floor, I was trying to prepare.  Primary RN then asked patient, Both side rails were up, and you let this one down?  Pt admits she did to assist with help to restroom.  Patient made aware that safety measures are in place to prevent falls or further injury while in this emergency department.  Pt assisted from floor to wheelchair and proceeded to use restroom with chaperone and assistance.  Primary RN notified both charge RN and Dr. Ellouise.  Patient assisted back to bed, both side rails raised and bed alarm placed for extra fall precautions.  Patient also placed in yellow grip socks, yellow fall risk sign already was placed on door prior to event.  Pt denies any head strikes or new injuries.  No respiratory distress noted.  PT directed to please use call light for any further assistance.  Pt verbalized understanding. Pt axox4. GCS 15

## 2024-01-04 NOTE — ED Notes (Signed)
 Assisted pt to restroom. Transferred herself to the toilet from wheelchair.

## 2024-01-04 NOTE — ED Provider Notes (Signed)
 " [place] EMERGENCY DEPARTMENT AT [facility] Provider Note   CSN: [id number] Arrival date & time: 01/04/24  1135     Patient presents with: Weakness   Gabriela Campbell is a 59 y.o. female.   59 year old female with history of MS, hypertension presents emergency room with complaint of pain and weakness in her legs.  She states that at baseline she ambulates with a walker and has some degree of weakness and pain however for the past 4 days, has had significantly worsening symptoms and is afraid that she will fall.  Also frequent muscle spasms in her legs.  She denies recent illness, any recent fall or injury.  No other complaints or concerns.       Prior to Admission medications  Medication Sig Start Date End Date Taking? Authorizing Provider  acetaminophen  (TYLENOL ) 325 MG tablet Take 2 tablets (650 mg total) by mouth every 6 (six) hours as needed for mild pain (pain score 1-3) (or Fever >/= 101). 07/10/23   Sheikh, Omair Latif, DO  amLODipine  (NORVASC ) 5 MG tablet Take 0.5 tablets (2.5 mg total) by mouth daily. Patient taking differently: Take 5 mg by mouth in the morning. 02/14/23   Laurence Locus, DO  Cholecalciferol  (VITAMIN D3) 25 MCG (1000 UT) CAPS Take 2,000 Units by mouth daily.    [provider]  Cyanocobalamin  (VITAMIN B12 PO) Take 1 tablet by mouth daily.    [provider]  methocarbamol  (ROBAXIN ) 500 MG tablet Take 1 tablet (500 mg total) by mouth every 8 (eight) hours as needed for muscle spasms. Patient not taking: Reported on 07/07/2023 06/01/23   Desiderio Chew, PA-C  nitrofurantoin , macrocrystal-monohydrate, (MACROBID ) 100 MG capsule Take 1 capsule (100 mg total) by mouth 2 (two) times daily. 09/07/23   Bauer, Collin S, PA-C  ondansetron  (ZOFRAN ) 4 MG tablet Take 1 tablet (4 mg total) by mouth every 6 (six) hours as needed for nausea. 07/10/23   Sherrill Cable Latif, DO  oxyCODONE  (OXY IR/ROXICODONE ) 5 MG immediate release tablet Take 1 tablet (5 mg  total) by mouth every 6 (six) hours as needed for up to 3 days for moderate pain (pain score 4-6). 07/10/23 07/13/23  Sherrill Cable Latif, DO  oxyCODONE -acetaminophen  (PERCOCET/ROXICET) 5-325 MG tablet Take 1 tablet by mouth every 6 (six) hours as needed for severe pain (pain score 7-10). 09/07/23   Bauer, Collin S, PA-C  polyethylene glycol (MIRALAX  / GLYCOLAX ) 17 g packet Take 17 g by mouth daily as needed for mild constipation. 07/10/23   Sherrill Cable Latif, DO  pregabalin  (LYRICA ) 25 MG capsule Take 1 capsule (25 mg total) by mouth 3 (three) times daily. Patient not taking: Reported on 07/07/2023 05/05/23 05/04/24  Jonel Lonni SQUIBB, MD  senna (SENOKOT) 8.6 MG TABS tablet Take 1 tablet (8.6 mg total) by mouth 2 (two) times daily. 07/10/23   Sherrill Cable Latif, DO  ursodiol  (ACTIGALL ) 300 MG capsule Take 600 mg by mouth in the morning. 02/27/23 03/05/24  [provider]    Allergies: Dilaudid [hydromorphone hcl], Fish allergy, Fish protein-containing drug products, Ibuprofen, Iodine, Penicillins, Shellfish allergy, Shrimp extract, Tramadol , Baclofen , Gabapentin , and Codeine    Review of Systems Negative except as per HPI Updated Vital Signs BP 131/85 (BP Location: Right Arm)   Pulse 62   Temp 98.6 F (37 C) (Oral)   Resp 20   Ht 5' 2 (1.575 m)   Wt 38.1 kg   SpO2 98%   BMI 15.36 kg/m   Physical  Exam Vitals and nursing note reviewed.  Constitutional:      General: She is not in acute distress.    Appearance: She is well-developed. She is not diaphoretic.  HENT:     Head: Normocephalic and atraumatic.  Cardiovascular:     Pulses: Normal pulses.  Pulmonary:     Effort: Pulmonary effort is normal.  Musculoskeletal:        General: Tenderness present. No swelling or deformity.       Legs:     Comments: Knees tender without effusion or overlying erythema   Skin:    General: Skin is warm and dry.     Findings: No erythema or rash.  Neurological:     Mental Status: She is alert  and oriented to person, place, and time.     Sensory: No sensory deficit.     Comments: Weakness bilateral lower extremities   Psychiatric:        Behavior: Behavior normal.     (all labs ordered are listed, but only abnormal results are displayed) Labs Reviewed  CBC WITH DIFFERENTIAL/PLATELET - Abnormal; Notable for the following components:      Result Value   Hemoglobin 15.1 (*)    Platelets 135 (*)    All other components within normal limits  COMPREHENSIVE METABOLIC PANEL WITH GFR - Abnormal; Notable for the following components:   ALT 69 (*)    Alkaline Phosphatase 653 (*)    All other components within normal limits  URINALYSIS, ROUTINE W REFLEX MICROSCOPIC - Abnormal; Notable for the following components:   APPearance CLOUDY (*)    Protein, ur 100 (*)    Leukocytes,Ua TRACE (*)    Bacteria, UA RARE (*)    All other components within normal limits  MAGNESIUM     EKG: None  Radiology: MR THORACIC SPINE W WO CONTRAST Result Date: 01/04/2024 EXAM: MRI THORACIC SPINE WITH AND WITHOUT INTRAVENOUS CONTRAST 01/04/2024 06:54:27 PM TECHNIQUE: Multiplanar multisequence MRI of the thoracic spine was performed with and without the administration of intravenous contrast. 4 mL (gadobutrol  (GADAVIST ) 1 MMOL/ML injection 4 mL GADOBUTROL  1 MMOL/ML IV SOLN). COMPARISON: MRI of the thoracic spine 09/07/2023. CLINICAL HISTORY: ms, weakness, evaluating for active lesions FINDINGS: BONES AND ALIGNMENT: Normal alignment. Normal vertebral body heights. Bone marrow signal is unremarkable. SPINAL CORD: Lesions at T5, T6-T7, and T8-T9 are stable. No new lesions or enhancement are present to suggest active demyelination. Normal spinal cord volume. Normal spinal cord signal. SOFT TISSUES: Scarring at the right lung apex is stable. DEGENERATIVE CHANGES: No significant disc herniation. No spinal canal stenosis or neural foraminal narrowing. IMPRESSION: 1. Stable lesions at T5, T6-T7, and T8-T9 with no new  lesions or enhancement to suggest active demyelination. Electronically signed by: Lonni Necessary MD 01/04/2024 07:37 PM EST RP Workstation: [id number]   MR Cervical Spine W and Wo Contrast Result Date: 01/04/2024 EXAM: MRI CERVICAL SPINE WITHOUT AND WITH CONTRAST 01/04/2024 06:54:27 PM TECHNIQUE: Multiplanar multisequence MRI of the cervical spine was performed without and with intravenous contrast. COMPARISON: MRI of the cervical spine 09/26/2023. CLINICAL HISTORY: ms, weakness, evaluating for active lesions FINDINGS: BONES AND ALIGNMENT: Normal alignment. Normal vertebral body heights. Bone marrow signal is unremarkable. SPINAL CORD: Normal spinal cord size. Posterior T2/STIR hyperintensity at C3 is stable. Left T2/STIR hyperintensity at C5 is stable. No new cord lesions or focal enhancement are present to suggest active demyelination. SOFT TISSUES: No paraspinal mass. C2-C3: No significant disc herniation. No spinal canal stenosis or neural foraminal narrowing. C3-C4:  No significant disc herniation. No spinal canal stenosis or neural foraminal narrowing. C4-C5: No significant disc herniation. No spinal canal stenosis or neural foraminal narrowing. C5-C6: No significant disc herniation. No spinal canal stenosis or neural foraminal narrowing. C6-C7: Left sided uncovertebral and facet hypertrophy leads to moderate left foraminal narrowing. No significant disc herniation. No spinal canal stenosis. C7-T1: No significant disc herniation. No spinal canal stenosis or neural foraminal narrowing. IMPRESSION: 1. No new cord lesions or focal enhancement to suggest active demyelination. 2. Stable chronic  lesions at C3 and C5 consistent with multiple sclerosis Electronically signed by: Lonni Necessary MD 01/04/2024 07:31 PM EST RP Workstation: [id number]   DG Chest Port 1 View Result Date: 01/04/2024 CLINICAL DATA:  Bilateral lower extremity weakness EXAM: PORTABLE CHEST 1 VIEW COMPARISON:  Jun 01, 2023 FINDINGS:  The heart size and mediastinal contours are within normal limits. Stable biapical scarring. No acute pulmonary disease is noted. The visualized skeletal structures are unremarkable. IMPRESSION: No active disease. Electronically Signed   By: Lynwood Landy Raddle M.D.   On: 01/04/2024 14:20     Procedures   Medications Ordered in the ED  nicotine  (NICODERM CQ  - dosed in mg/24 hours) patch 21 mg (has no administration in time range)  alum & mag hydroxide-simeth (MAALOX/MYLANTA) 200-200-20 MG/5ML suspension 30 mL (has no administration in time range)  ondansetron  (ZOFRAN ) tablet 4 mg (has no administration in time range)  magnesium  sulfate IVPB 2 g 50 mL (has no administration in time range)  oxyCODONE  (OXYCONTIN ) 12 hr tablet 10 mg (has no administration in time range)  cephALEXin  (KEFLEX ) capsule 500 mg (has no administration in time range)  oxyCODONE -acetaminophen  (PERCOCET/ROXICET) 5-325 MG per tablet 1 tablet (1 tablet Oral Given 01/04/24 1503)  cephALEXin  (KEFLEX ) capsule 500 mg (500 mg Oral Given 01/04/24 1713)  LORazepam  (ATIVAN ) injection 1 mg (1 mg Intravenous Given 01/04/24 1714)  gadobutrol  (GADAVIST ) 1 MMOL/ML injection 4 mL (4 mLs Intravenous Contrast Given 01/04/24 1841)                                    Medical Decision Making Amount and/or Complexity of Data Reviewed Labs: ordered. Radiology: ordered.  Risk Prescription drug management.   This patient presents to the ED for concern of leg weakness, this involves an extensive number of treatment options, and is a complaint that carries with it a high risk of complications and morbidity.  The differential diagnosis includes demyelinating process related to patient's MS, metabolic or electrolyte derangement   Co morbidities / Chronic conditions that complicate the patient evaluation  Chronic leg pain, MS, scoliosis, hypertension   Additional history obtained:  Additional history obtained from EMR External records from outside  source obtained and reviewed including prior labs and imaging on file.  Patient admitted to the hospital in July 2025 with similar presentation.  MRI at that time did not show demyelinating process/active lesion.   Lab Tests:  I Ordered, and personally interpreted labs.  The pertinent results include: Urinalysis concerning for possible UTI with trace leukocytes, 11-20 WBC and rare bacteria.  Magnesium  normal.  CBC without segment findings.  CMP with minimally elevated ALT at 69, alk phos is notably elevated at 63.   Imaging Studies ordered:  I ordered imaging studies including in consultation with neurology, MRI C-spine and T-spine with and without contrast.  Chest x-ray I independently visualized and interpreted imaging which showed chest x-ray without acute  process I agree with the radiologist interpretation MRI C-spine with no active lesions.   Problem List / ED Course / Critical interventions / Medication management  59 year old female with history of MS presents emergency room with complaint of acute on chronic leg pain and weakness.  Admitted to the hospital in July for similar although MRI ultimately without active lesions.  Patient provided with Percocet for pain.  Found to have small urinary tract infection.  Discussed with neurology who recommends MRI C and T-spine with and without contrast.  MRIs are without acute demyelinating process.  Patient has put the bed rail down while awaiting wheelchair to get assistance to the bathroom and put herself on the floor.  She did not fall and does not have any injuries from this. Plan is to hold for PT eval and TOC consult in the morning  I ordered medication including ativan  for anxiety for imaging. Percocet for leg pain. Keflex  for UTI   Reevaluation of the patient after these medicines showed that the patient stable I have reviewed the patients home medicines and have made adjustments as needed   Consultations Obtained:  I requested  consultation with the Dr. Matthews, neurology,  and discussed lab and imaging findings as well as pertinent plan - they recommend: symptoms possibly related to her UTI, treat with abx. MRI c and t with and without. If active lesions, will need to admit, if NO active lesions, can dc home.    Social Determinants of Health:  No PCP on file   Test / Admission - Considered:  Boarder for TOC and PT       Final diagnoses:  Weakness  Urinary tract infection in female  MS (multiple sclerosis)    ED Discharge Orders     None          Cherron Leita LABOR, PA-C 01/04/24 1952    Ellouise Richerd POUR, DO 01/04/24 2213  "

## 2024-01-05 MED ORDER — CEPHALEXIN 500 MG PO CAPS: 500.0000 mg | ORAL_CAPSULE | Freq: Two times a day (BID) | ORAL | 0 refills | Status: AC

## 2024-01-05 MED ORDER — OXYCODONE HCL 5 MG PO TABS
5.0000 mg | ORAL_TABLET | Freq: Once | ORAL | Status: AC
  Administered 2024-01-05: 5 mg via ORAL
  Filled 2024-01-05: qty 1

## 2024-01-05 MED ORDER — ACETAMINOPHEN 325 MG PO TABS
650.0000 mg | ORAL_TABLET | Freq: Four times a day (QID) | ORAL | Status: DC | PRN
  Filled 2024-01-05: qty 2

## 2024-01-05 NOTE — ED Notes (Signed)
 Writer was speaking with pt and she advised she wanted to go home. Pt advised she doesn't want to speak to social work and that she could get her neighbor to pick her up. Writer inquired about how she would get around her house since she has an unsteady gait. Pt advised she would figure it out.

## 2024-01-05 NOTE — ED Provider Notes (Signed)
 7:58 AM I was asked by nursing to come reassess patient because she says she is called her neighbor and wants a ride home because she is feeling better and wants to leave.  I reviewed her chart and patient was going to be a physical therapy and transition of care border to discuss placement as she was having instability and difficulty with walking.  Patient says she feels confident that she will not have a fall and would like to go home now.  She also was found to have a UTI.  Will put in a prescription for antibiotics for her UTI and she can follow-up with her outpatient neurology team and PCP.  Patient agrees with this plan.  She understands return precautions and will be discharged for outpatient follow-up.  Clinical Impression: 1. Weakness   2. Urinary tract infection in female   3. MS (multiple sclerosis)     Disposition: Discharge  Condition: Good  I have discussed the results, Dx and Tx plan with the pt(& family if present). He/she/they expressed understanding and agree(s) with the plan. Discharge instructions discussed at great length. Strict return precautions discussed and pt &/or family have verbalized understanding of the instructions. No further questions at time of discharge.    New Prescriptions   No medications on file    Follow Up: [facility]) [address] [place]  [address] [phone]       Charise Leinbach, Lonni PARAS, MD 01/05/24 [phone]

## 2024-01-05 NOTE — Discharge Instructions (Addendum)
 Your history, exam, and workup today seem consistent with urinary tract infection that is caused you to have generalized weakness as the workup over the last 20 hours here did not reveal clear evidence of acute demyelination or active multiple sclerosis flare.  Given your desire to go home and well appearance we agree with plan for discharge with plans to follow-up with outpatient PCP and neurology.  Please rest and stay hydrated and take the antibiotic to treat the UTI for the next week.

## 2024-01-23 ENCOUNTER — Emergency Department (HOSPITAL_COMMUNITY)

## 2024-01-23 ENCOUNTER — Emergency Department (HOSPITAL_COMMUNITY)
Admission: EM | Admit: 2024-01-23 | Discharge: 2024-01-23 | Disposition: A | Discharge status: Home / Self Care | Attending: Emergency Medicine | Admitting: Emergency Medicine

## 2024-01-23 ENCOUNTER — Other Ambulatory Visit: Payer: Self-pay

## 2024-01-23 DIAGNOSIS — I10 Essential (primary) hypertension: Secondary | ICD-10-CM | POA: Diagnosis not present

## 2024-01-23 DIAGNOSIS — M79605 Pain in left leg: Secondary | ICD-10-CM | POA: Diagnosis not present

## 2024-01-23 DIAGNOSIS — M545 Low back pain, unspecified: Secondary | ICD-10-CM | POA: Diagnosis not present

## 2024-01-23 DIAGNOSIS — R7401 Elevation of levels of liver transaminase levels: Secondary | ICD-10-CM | POA: Diagnosis not present

## 2024-01-23 DIAGNOSIS — R202 Paresthesia of skin: Secondary | ICD-10-CM | POA: Insufficient documentation

## 2024-01-23 DIAGNOSIS — Z79899 Other long term (current) drug therapy: Secondary | ICD-10-CM | POA: Insufficient documentation

## 2024-01-23 DIAGNOSIS — R531 Weakness: Secondary | ICD-10-CM | POA: Insufficient documentation

## 2024-01-23 DIAGNOSIS — M79604 Pain in right leg: Secondary | ICD-10-CM | POA: Insufficient documentation

## 2024-01-23 LAB — URINALYSIS, ROUTINE W REFLEX MICROSCOPIC
Bacteria, UA: NONE SEEN
Bilirubin Urine: NEGATIVE
Glucose, UA: NEGATIVE mg/dL
Ketones, ur: NEGATIVE mg/dL
Leukocytes,Ua: NEGATIVE
Nitrite: NEGATIVE
Protein, ur: NEGATIVE mg/dL
Specific Gravity, Urine: 1.01 (ref 1.005–1.030)
pH: 5 (ref 5.0–8.0)

## 2024-01-23 LAB — COMPREHENSIVE METABOLIC PANEL WITH GFR
ALT: 82 U/L — ABNORMAL HIGH (ref 0–44)
AST: 46 U/L — ABNORMAL HIGH (ref 15–41)
Albumin: 4.1 g/dL (ref 3.5–5.0)
Alkaline Phosphatase: 582 U/L — ABNORMAL HIGH (ref 38–126)
Anion gap: 11 (ref 5–15)
BUN: 13 mg/dL (ref 6–20)
CO2: 25 mmol/L (ref 22–32)
Calcium: 9.5 mg/dL (ref 8.9–10.3)
Chloride: 102 mmol/L (ref 98–111)
Creatinine, Ser: 0.76 mg/dL (ref 0.44–1.00)
GFR, Estimated: >60 mL/min
Glucose, Bld: 102 mg/dL — ABNORMAL HIGH (ref 70–99)
Potassium: 3.9 mmol/L (ref 3.5–5.1)
Sodium: 137 mmol/L (ref 135–145)
Total Bilirubin: 0.3 mg/dL (ref 0.0–1.2)
Total Protein: 7.2 g/dL (ref 6.5–8.1)

## 2024-01-23 LAB — CBC
HCT: 46.6 % — ABNORMAL HIGH (ref 36.0–46.0)
Hemoglobin: 15.6 g/dL — ABNORMAL HIGH (ref 12.0–15.0)
MCH: 32 pg (ref 26.0–34.0)
MCHC: 33.5 g/dL (ref 30.0–36.0)
MCV: 95.7 fL (ref 80.0–100.0)
Platelets: 149 K/uL — ABNORMAL LOW (ref 150–400)
RBC: 4.87 MIL/uL (ref 3.87–5.11)
RDW: 13.8 % (ref 11.5–15.5)
WBC: 11.2 K/uL — ABNORMAL HIGH (ref 4.0–10.5)
nRBC: 0 % (ref 0.0–0.2)

## 2024-01-23 LAB — TROPONIN T, HIGH SENSITIVITY
Troponin T High Sensitivity: <6 ng/L (ref 0–19)
Troponin T High Sensitivity: <6 ng/L (ref 0–19)

## 2024-01-23 LAB — LIPASE, BLOOD: Lipase: <10 U/L — ABNORMAL LOW (ref 11–51)

## 2024-01-23 MED ORDER — MORPHINE SULFATE (PF) 4 MG/ML IV SOLN
4.0000 mg | Freq: Once | INTRAVENOUS | Status: AC
  Administered 2024-01-23: 4 mg via INTRAVENOUS
  Filled 2024-01-23: qty 1

## 2024-01-23 MED ORDER — SODIUM CHLORIDE 0.9 % IV SOLN
1000.0000 mg | Freq: Once | INTRAVENOUS | Status: AC
  Administered 2024-01-23: 1000 mg via INTRAVENOUS
  Filled 2024-01-23: qty 16

## 2024-01-23 MED ORDER — SODIUM CHLORIDE 0.9 % IV BOLUS
1000.0000 mL | Freq: Once | INTRAVENOUS | Status: AC
  Administered 2024-01-23: 1000 mL via INTRAVENOUS

## 2024-01-23 NOTE — ED Provider Notes (Signed)
 " [facility] AT [facility] Provider Note   CSN: [id number] Arrival date & time: 01/23/24  1214     Patient presents with: Leg Pain   Gabriela Campbell is a 59 y.o. female.   The history is provided by the patient and medical records. No language interpreter was used.  Leg Pain    59 year old female with history of multiple sclerosis, hypertension, scoliosis, brought here via EMS from home with concerns of an MS flare.  Patient states for the past 3 to 4 days she has had progressive worsening weakness to her upper and lower extremities.  She has got to the point where she is having trouble using her rollator to move about because she fears of falling and injuring herself.  She endorsed tingling numbness sensation to her extremities.  She feels like this is her MS flare.  Patient endorsed lower back pain but this is not unusual for her.  She does not endorse any significant headache, neck pain, vision changes, trouble speaking, or trouble swallowing.  Denies having any fever or chills no recent sickness no urinary discomfort no cold symptoms.  She denies any significant treatment tried at home.  She mention she was admitted to the hospital a year ago for same presentation.  States her neurologist is Dr. Onita.  Prior to Admission medications  Medication Sig Start Date End Date Taking? Authorizing Provider  acetaminophen  (TYLENOL ) 325 MG tablet Take 2 tablets (650 mg total) by mouth every 6 (six) hours as needed for mild pain (pain score 1-3) (or Fever >/= 101). 07/10/23   Sheikh, Omair Latif, DO  amLODipine  (NORVASC ) 5 MG tablet Take 5 mg by mouth daily.    [provider]  Cholecalciferol  (VITAMIN D3) 25 MCG (1000 UT) CAPS Take 2,000 Units by mouth daily.    [provider]  Cyanocobalamin  (VITAMIN B12 PO) Take 1 tablet by mouth daily.    [provider]  lisinopril (ZESTRIL) 10 MG tablet Take 10 mg by mouth daily.    [provider]  methocarbamol  (ROBAXIN ) 500 MG tablet Take 1 tablet (500 mg total) by mouth every 8 (eight) hours as needed for muscle spasms. Patient not taking: Reported on 07/07/2023 06/01/23   Desiderio Chew, PA-C  ondansetron  (ZOFRAN ) 4 MG tablet Take 1 tablet (4 mg total) by mouth every 6 (six) hours as needed for nausea. 07/10/23   Sheikh, Alejandro Latif, DO  ondansetron  (ZOFRAN -ODT) 4 MG disintegrating tablet Take 4 mg by mouth every 8 (eight) hours as needed for nausea or vomiting.    [provider]  oxyCODONE  (OXY IR/ROXICODONE ) 5 MG immediate release tablet Take 1 tablet (5 mg total) by mouth every 6 (six) hours as needed for up to 3 days for moderate pain (pain score 4-6). 07/10/23 07/13/23  Sherrill Alejandro Latif, DO  polyethylene glycol (MIRALAX  / GLYCOLAX ) 17 g packet Take 17 g by mouth daily as needed for mild constipation. 07/10/23   Sheikh, Omair Latif, DO  senna (SENOKOT) 8.6 MG TABS tablet Take 1 tablet (8.6 mg total) by mouth 2 (two) times daily. 07/10/23   Sherrill Alejandro Latif, DO  tiZANidine  (ZANAFLEX ) 4 MG tablet Take 4 mg by mouth every 6 (six) hours as needed. 12/03/23   [provider]  ursodiol  (ACTIGALL ) 300 MG capsule Take 600 mg by mouth in the morning. 02/27/23 03/05/24  [provider]    Allergies: Dilaudid [hydromorphone hcl], Fish allergy, Fish protein-containing drug products, Ibuprofen, Iodine, Penicillins,  Shellfish allergy, Shrimp extract, Tramadol , Baclofen , Gabapentin , and Codeine    Review of Systems  All other systems reviewed and are negative.   Updated Vital Signs BP 108/76 (BP Location: Right Arm)   Pulse 71   Temp 98.1 F (36.7 C) (Oral)   Resp 17   SpO2 98%   Physical Exam Vitals and nursing note reviewed.  Constitutional:      General: She is not in acute distress.    Appearance: She is well-developed.     Comments: Gabriela Campbell female appears older than stated age, resting comfortably in bed in no acute discomfort.  She is  playing on her phone.  HENT:     Head: Atraumatic.  Eyes:     Conjunctiva/sclera: Conjunctivae normal.  Cardiovascular:     Rate and Rhythm: Normal rate and regular rhythm.     Pulses: Normal pulses.     Heart sounds: Normal heart sounds.  Pulmonary:     Effort: Pulmonary effort is normal.  Abdominal:     Palpations: Abdomen is soft.     Tenderness: There is no abdominal tenderness.  Musculoskeletal:     Cervical back: Neck supple.     Comments:  No significant midline spine tenderness   Skin:    Findings: No rash.  Neurological:     Mental Status: She is alert and oriented to person, place, and time.     Comments: 4 out of 5 strength to bilateral upper extremities 3 out of 5 strength to bilateral lower extremities Patella is hyperreflexive bilaterally.  No foot drop.  Able to dorsiflex and plantarflex both feet.   Sensation is intact throughout  Psychiatric:        Mood and Affect: Mood normal.     (all labs ordered are listed, but only abnormal results are displayed) Labs Reviewed  COMPREHENSIVE METABOLIC PANEL WITH GFR - Abnormal; Notable for the following components:      Result Value   Glucose, Bld 102 (*)    AST 46 (*)    ALT 82 (*)    Alkaline Phosphatase 582 (*)    All other components within normal limits  CBC - Abnormal; Notable for the following components:   WBC 11.2 (*)    Hemoglobin 15.6 (*)    HCT 46.6 (*)    Platelets 149 (*)    All other components within normal limits  LIPASE, BLOOD - Abnormal; Notable for the following components:   Lipase <10 (*)    All other components within normal limits  URINALYSIS, ROUTINE W REFLEX MICROSCOPIC - Abnormal; Notable for the following components:   Hgb urine dipstick SMALL (*)    All other components within normal limits  TROPONIN T, HIGH SENSITIVITY  TROPONIN T, HIGH SENSITIVITY    EKG: None  Radiology: DG Chest 2 View Result Date: 01/23/2024 CLINICAL DATA:  Weakness EXAM: CHEST - 2 VIEW COMPARISON:   January 04, 2024.  April 08, 2023. FINDINGS: The heart size and mediastinal contours are within normal limits. Stable biapical scarring. No acute pulmonary disease. The visualized skeletal structures are unremarkable. IMPRESSION: No active cardiopulmonary disease. Electronically Signed   By: Lynwood Landy Raddle M.D.   On: 01/23/2024 13:41     Procedures   Medications Ordered in the ED  methylPREDNISolone  sodium succinate (SOLU-MEDROL ) 1,000 mg in sodium chloride  0.9 % 50 mL IVPB (has no administration in time range)  morphine  (PF) 4 MG/ML injection 4 mg (4 mg Intravenous Given 01/23/24 1623)  sodium chloride  0.9 %  bolus 1,000 mL (1,000 mLs Intravenous New Bag/Given 01/23/24 1623)                                    Medical Decision Making Amount and/or Complexity of Data Reviewed Labs: ordered.  Risk Prescription drug management.   BP 108/76 (BP Location: Right Arm)   Pulse 71   Temp 98.1 F (36.7 C) (Oral)   Resp 17   SpO2 98%   59:20 PM  59 year old female with history of multiple sclerosis, hypertension, scoliosis, brought here via EMS from home with concerns of an MS flare.  Patient states for the past 3 to 4 days she has had progressive worsening weakness to her upper and lower extremities.  She has got to the point where she is having trouble using her rollator to move about because she fears of falling and injuring herself.  She endorsed tingling numbness sensation to her extremities.  She feels like this is her MS flare.  Patient endorsed lower back pain but this is not unusual for her.  She does not endorse any significant headache, neck pain, vision changes, trouble speaking, or trouble swallowing.  Denies having any fever or chills no recent sickness no urinary discomfort no cold symptoms.  She denies any significant treatment tried at home.  She mention she was admitted to the hospital a year ago for same presentation.  States her neurologist is Dr. Onita.  On exam, patient does have  weakness to both upper and lower extremities but sensation remains intact and reflex intact.  She is mentating appropriately.  -Labs ordered, independently viewed and interpreted by me.  Labs remarkable for mild transaminitis with AST 46, ALT 82, alk phos 582 which is not far off from her baseline.  It appears patient has chronic hepatitis based on her chart. -The patient was maintained on a cardiac monitor.  I personally viewed and interpreted the cardiac monitored which showed an underlying rhythm of: Sinus rhythm -Imaging including brain, cervical spine, thoracic spine, lumbar spine MRI to assess for MS flare were considered but after discussion with on-call neurologist Dr. Deedra, felt patient would benefit from a gram of Solu-Medrol  and outpatient follow-up -This patient presents to the ED for concern of weakness, this involves an extensive number of treatment options, and is a complaint that carries with it a high risk of complications and morbidity.  The differential diagnosis includes MS flare, metabolic derangement, infection, caudal equina -Co morbidities that complicate the patient evaluation includes MS, HTN, scoliosis -Treatment includes morphine , IVF, solumedrol -Reevaluation of the patient after these medicines showed that the patient improved -PCP office notes or outside notes reviewed -Discussion with specialist on call neurologist DR. Arora who recommend for patient to receive 1 g of Solu-Medrol  and outpatient follow-up with neurology. -Escalation to admission/observation considered: patients feels much better, is comfortable with discharge, and will follow up with PCP -Prescription medication considered, patient comfortable with home meds -Social Determinant of Health considered which includes tobacco use         Final diagnoses:  Bilateral leg pain    ED Discharge Orders     None          Nivia Colon, PA-C 01/23/24 2005  "

## 2024-01-23 NOTE — ED Triage Notes (Signed)
 Pt BIB EMS from home for MS flare up since Monday. Pt is unable to stand or walk. Pt also complain of nausea.

## 2024-01-23 NOTE — Discharge Instructions (Signed)
 You have symptoms likely due to multiple sclerosis flare.  You have received steroid during this visit and it will help with your condition.  Please follow-up closely with your neurologist for outpatient management of your condition.  Continue to use your rollator to help with ambulation.

## 2024-01-23 NOTE — Progress Notes (Signed)
 [facility] ED WPT [facility] Liaison Note:    This is a current home-based patient with [facility]. We can provide next business day visits, including labs, diagnostics, medications, disease management, and ongoing monitoring to prevent an unnecessary hospital admission.    Please call with any questions or concerns. Liaison will continue to follow for discharge disposition.    Thank you, Eleanor Nail, LPN [facility] Liaison [phone]

## 2024-01-23 NOTE — ED Notes (Signed)
 Patient able to ambulate with 1 assist to [facility] or bathroom.

## 2024-01-23 NOTE — ED Provider Triage Note (Signed)
 Emergency Medicine Provider Triage Evaluation Note  Gabriela Campbell , a 59 y.o. female  was evaluated in triage.  Pt complains of weakness. Hx of MS. Only 1 flare in the past. Sxs today include BL upper extremity heaviness and nausea. BL leg  pain.. feel generally weak.  No fever  Review of Systems  Positive:  Negative:   Physical Exam  BP 91/67 (BP Location: Left Arm)   Pulse 74   Temp 98 F (36.7 C) (Oral)   Resp 18   SpO2 96%  Gen:   Awake, no distress   Resp:  Normal effort  MSK:   Moves extremities without difficulty  Other:  Moves extremities w/o ataxia  Medical Decision Making  Medically screening exam initiated at 1:02 PM.  Appropriate orders placed.  Gabriela Campbell was informed that the remainder of the evaluation will be completed by another provider, this initial triage assessment does not replace that evaluation, and the importance of remaining in the ED until their evaluation is complete.  Low suspicion for MS- r/o atypical acs, abd causes    Arloa Chroman, PA-C 01/23/24 1306
